# Patient Record
Sex: Female | Born: 1960 | Race: Black or African American | Hispanic: No | Marital: Married | Smoking: Never smoker
Health system: Southern US, Community
[De-identification: ages and names within clinical notes are randomized; demographics above are authoritative.]

## PROBLEM LIST (undated history)

## (undated) DIAGNOSIS — R7303 Prediabetes: Secondary | ICD-10-CM

## (undated) DIAGNOSIS — I10 Essential (primary) hypertension: Secondary | ICD-10-CM

## (undated) HISTORY — PX: ABDOMINAL HYSTERECTOMY: SHX81

## (undated) HISTORY — PX: SHOULDER SURGERY: SHX246

---

## 1999-12-20 ENCOUNTER — Emergency Department (HOSPITAL_COMMUNITY): Admission: EM | Admit: 1999-12-20 | Discharge: 1999-12-20 | Payer: Self-pay | Admitting: Emergency Medicine

## 2000-01-25 ENCOUNTER — Encounter: Payer: Self-pay | Admitting: Emergency Medicine

## 2000-01-25 ENCOUNTER — Emergency Department (HOSPITAL_COMMUNITY): Admission: EM | Admit: 2000-01-25 | Discharge: 2000-01-25 | Payer: Self-pay | Admitting: Emergency Medicine

## 2000-03-28 ENCOUNTER — Encounter: Admission: RE | Admit: 2000-03-28 | Discharge: 2000-04-29 | Payer: Self-pay | Admitting: Family Medicine

## 2000-12-12 ENCOUNTER — Emergency Department (HOSPITAL_COMMUNITY): Admission: EM | Admit: 2000-12-12 | Discharge: 2000-12-12 | Payer: Self-pay | Admitting: Nurse Practitioner

## 2000-12-12 ENCOUNTER — Encounter: Payer: Self-pay | Admitting: Emergency Medicine

## 2002-10-16 ENCOUNTER — Emergency Department (HOSPITAL_COMMUNITY): Admission: EM | Admit: 2002-10-16 | Discharge: 2002-10-16 | Payer: Self-pay | Admitting: Emergency Medicine

## 2003-04-02 ENCOUNTER — Ambulatory Visit (HOSPITAL_COMMUNITY): Admission: RE | Admit: 2003-04-02 | Discharge: 2003-04-02 | Payer: Self-pay | Admitting: Emergency Medicine

## 2003-04-02 ENCOUNTER — Emergency Department (HOSPITAL_COMMUNITY): Admission: EM | Admit: 2003-04-02 | Discharge: 2003-04-02 | Payer: Self-pay | Admitting: Emergency Medicine

## 2003-04-15 ENCOUNTER — Other Ambulatory Visit: Admission: RE | Admit: 2003-04-15 | Discharge: 2003-04-15 | Payer: Self-pay | Admitting: Family Medicine

## 2004-05-22 ENCOUNTER — Emergency Department (HOSPITAL_COMMUNITY): Admission: EM | Admit: 2004-05-22 | Discharge: 2004-05-22 | Payer: Self-pay | Admitting: Emergency Medicine

## 2004-06-13 ENCOUNTER — Encounter: Admission: RE | Admit: 2004-06-13 | Discharge: 2004-06-13 | Payer: Self-pay | Admitting: Family Medicine

## 2004-06-20 ENCOUNTER — Other Ambulatory Visit: Admission: RE | Admit: 2004-06-20 | Discharge: 2004-06-20 | Payer: Self-pay | Admitting: Family Medicine

## 2004-08-15 ENCOUNTER — Encounter: Admission: RE | Admit: 2004-08-15 | Discharge: 2004-08-15 | Payer: Self-pay | Admitting: Family Medicine

## 2004-09-06 ENCOUNTER — Encounter: Admission: RE | Admit: 2004-09-06 | Discharge: 2004-09-06 | Payer: Self-pay | Admitting: Family Medicine

## 2004-12-14 ENCOUNTER — Encounter (INDEPENDENT_AMBULATORY_CARE_PROVIDER_SITE_OTHER): Payer: Self-pay | Admitting: Specialist

## 2004-12-14 ENCOUNTER — Observation Stay (HOSPITAL_COMMUNITY): Admission: RE | Admit: 2004-12-14 | Discharge: 2004-12-15 | Payer: Self-pay | Admitting: *Deleted

## 2009-05-15 ENCOUNTER — Encounter: Admission: RE | Admit: 2009-05-15 | Discharge: 2009-05-15 | Payer: Self-pay | Admitting: Internal Medicine

## 2009-12-29 ENCOUNTER — Encounter: Admission: RE | Admit: 2009-12-29 | Discharge: 2009-12-29 | Payer: Self-pay | Admitting: Orthopedic Surgery

## 2010-06-15 NOTE — Op Note (Signed)
Marisa [name], [name]          ACCOUNT NO.:  [id number]   MEDICAL RECORD NO.:  [id number]          PATIENT TYPE:  OBV   LOCATION:  9399                          FACILITY:  [facility]   PHYSICIAN:  Gerri Spore B. Earlene Plater, M.D.  DATE OF BIRTH:  1960-06-18   DATE OF PROCEDURE:  12/14/2004  DATE OF DISCHARGE:                                 OPERATIVE REPORT   PREOPERATIVE DIAGNOSES:  1.  Abnormal uterine bleeding.  2.  Fibroid uterus.   POSTOPERATIVE DIAGNOSES:  1.  Abnormal uterine bleeding.  2.  Fibroid uterus.   OPERATION/PROCEDURE:  Laparoscopic supracervical hysterectomy.   SURGEON:  Chester Holstein. Earlene Plater, M.D.   ASSISTANT:  Cordelia Pen A. Rosalio Macadamia, M.D.   ANESTHESIA:  General.   SPECIMENS:  Uterus and tubal fibroid.   ESTIMATED BLOOD LOSS:  700 mL.   COMPLICATIONS:  None.   INDICATIONS:  The patient with a history of heavy menstrual bleeding.  Ultrasound showed uterine fibroids.  Endometrial biopsy was benign.  No  history of abnormal Pap smears.  The patient desires minimally invasive  definitive surgical therapy with laparoscopic supracervical hysterectomy.  She is aware of the 1% chance of regular menses and 7% staining or spotting  rate after this type of surgery as well as the need continued cervical  cytology.  The patient was advised of the risks of surgery including  infection, bleeding, damage to surrounding organs and potential need to  convert to abdominal hysterectomy.   DESCRIPTION OF PROCEDURE:  The patient was taken to the operating room and  general anesthesia obtained.  She was prepped and draped in the standard  fashion and Foley catheter inserted into the bladder.   A 10 mm incision placed in the umbilicus, carried sharply to the fascia.  The fascia was divided sharply and elevated with Kocher clamps.  Posterior  sheath and peritoneum were elevated with an Allis clamp and divided sharply.  Pursestring suture of 0 Vicryl placed around the fascial defect.  Hasson  cannula inserted and secured.  Pneumoperitoneum obtained with CO2 gas.  Trendelenburg position obtained and 11 mm port placed in the left upper  quadrant.  Right lower quadrant had a 5 mm port placed, each under direct  laparoscopic visualization.   The pelvis was inspected and the above findings noted.   The course of each ureter was identified and found to be well away from the  area of dissection.  The left round ligament identified, sealed and divided  with the Harmonic scalpel.  Tube and utero-ovarian ligament were similarly  sealed and divided.  Repeated on the right side in similar manner.  Bladder  flap created with Harmonic sharply.  Left uterine artery was skeletonized,  sealed and divided with the Harmonic.  Repeated on the right in the same  manner.  Cervix was truncated with a reverse cone fashion using the  Harmonic.  Hemostasis was obtained.  Harmonic was placed in the canal of the  cervix, placed on max setting for 20 seconds.   The morcellating device was then inserted in the left lower quadrant port  and morcellation performed in the standard fashion.  All debris was removed.  Pelvis was irrigated, reinspected and the intra-abdominal pressure taken  down below 9 mm.  One bleeder on the posterior aspect of the cervical stump  was noted and made hemostatic with the bipolar as was the bleeder on the  right utero-ovarian pedicle.   The lower ports were removed and their sites inspected laparoscopically.  They were hemostatic.  The fascia was not closed in the left lower quadrant  as this was an 11 mm Excel trocar.   The scope was removed, gas released and Hasson cannula removed.  The fascia  was closed with a pursestring suture.  No abdominal contents were entered  into prior to closure.  Skin closed in the umbilicus with subcuticular 4-0  Vicryl.  Skin was closed in the lower ports with Dermabond.   The patient tolerated the procedure well.  No complications.  She was  taken  to the recovery room awake, alert and in stable condition.      Gerri Spore B. Earlene Plater, M.D.  Electronically Signed     WBD/MEDQ  D:  12/14/2004  T:  12/14/2004  Job:  [id number]

## 2012-07-31 ENCOUNTER — Emergency Department (HOSPITAL_COMMUNITY): Payer: Federal, State, Local not specified - PPO

## 2012-07-31 ENCOUNTER — Emergency Department (HOSPITAL_COMMUNITY)
Admission: EM | Admit: 2012-07-31 | Discharge: 2012-08-01 | Disposition: A | Discharge status: Home / Self Care | Payer: Federal, State, Local not specified - PPO | Attending: Emergency Medicine | Admitting: Emergency Medicine

## 2012-07-31 ENCOUNTER — Encounter (HOSPITAL_COMMUNITY): Payer: Self-pay | Admitting: *Deleted

## 2012-07-31 DIAGNOSIS — R51 Headache: Secondary | ICD-10-CM | POA: Insufficient documentation

## 2012-07-31 DIAGNOSIS — I10 Essential (primary) hypertension: Secondary | ICD-10-CM

## 2012-07-31 DIAGNOSIS — R1110 Vomiting, unspecified: Secondary | ICD-10-CM | POA: Insufficient documentation

## 2012-07-31 HISTORY — DX: Prediabetes: R73.03

## 2012-07-31 HISTORY — DX: Essential (primary) hypertension: I10

## 2012-07-31 LAB — POCT I-STAT, CHEM 8
BUN: 9 mg/dL (ref 6–23)
Chloride: 102 mEq/L (ref 96–112)
Glucose, Bld: 138 mg/dL — ABNORMAL HIGH (ref 70–99)
Hemoglobin: 15 g/dL (ref 12.0–15.0)
Potassium: 3.3 mEq/L — ABNORMAL LOW (ref 3.5–5.1)
Sodium: 141 mEq/L (ref 135–145)
TCO2: 29 mmol/L (ref 0–100)

## 2012-07-31 MED ORDER — AMLODIPINE BESYLATE 5 MG PO TABS
5.0000 mg | ORAL_TABLET | Freq: Once | ORAL | Status: AC
  Administered 2012-08-01: 5 mg via ORAL
  Filled 2012-07-31: qty 1

## 2012-07-31 MED ORDER — ONDANSETRON 8 MG PO TBDP
8.0000 mg | ORAL_TABLET | Freq: Once | ORAL | Status: AC
  Administered 2012-08-01: 8 mg via ORAL
  Filled 2012-07-31: qty 1

## 2012-07-31 NOTE — ED Notes (Signed)
Pt states she has not been taking blood pressure medication in months, pt has headache and pressure 202/116  ,  Pt also states borderline diabetic

## 2012-07-31 NOTE — ED Notes (Signed)
Patient transported to CT 

## 2012-07-31 NOTE — ED Provider Notes (Signed)
History    CSN: [id number] Arrival date & time 07/31/12  2310  First MD Initiated Contact with Patient 07/31/12 2326     Chief Complaint  Patient presents with  . Hypertension   (Consider location/radiation/quality/duration/timing/severity/associated sxs/prior Treatment) Patient is a 52 y.o. female presenting with hypertension. The history is provided by the patient.  Hypertension This is a chronic problem. The current episode started more than 1 week ago. The problem occurs constantly. The problem has not changed since onset.Associated symptoms include headaches. Pertinent negatives include no chest pain, no abdominal pain and no shortness of breath. Nothing aggravates the symptoms. Nothing relieves the symptoms. She has tried nothing for the symptoms. The treatment provided no relief.  Stopped taking her meds months ago.  Today had a lot of sodium in [facility] and thinks it was bad and started vomiting and saw the grease and then had headache that has lasted 1 hour so far and is improving.  No weakness nor numbness. No changes in vision or speech Past Medical History  Diagnosis Date  . Hypertension   . Borderline diabetes    No past surgical history on file. History reviewed. No pertinent family history. History  Substance Use Topics  . Smoking status: Never Smoker   . Smokeless tobacco: Not on file  . Alcohol Use: No   OB History   Grav Para Term Preterm Abortions TAB SAB Ect Mult Living                 Review of Systems  Constitutional: Negative for fever.  HENT: Negative for neck pain and neck stiffness.   Eyes: Negative for photophobia and visual disturbance.  Respiratory: Negative for chest tightness and shortness of breath.   Cardiovascular: Negative for chest pain.  Gastrointestinal: Negative for abdominal pain.  Neurological: Positive for headaches. Negative for dizziness, facial asymmetry, speech difficulty, weakness and numbness.  All other systems reviewed and are  negative.    Allergies  Review of patient's allergies indicates no known allergies.  Home Medications   Current Outpatient Rx  Name  Route  Sig  Dispense  Refill  . aspirin-acetaminophen-caffeine (EXCEDRIN MIGRAINE) 250-250-65 MG per tablet   Oral   Take 2 tablets by mouth every 6 (six) hours as needed for pain.          BP 194/94  Pulse 85  Temp(Src) 98 F (36.7 C) (Oral)  Resp 15  Ht 4\' 11"  (1.499 m)  Wt 225 lb (102.059 kg)  BMI 45.42 kg/m2  SpO2 99% Physical Exam  Constitutional: She is oriented to person, place, and time. She appears well-developed and well-nourished. No distress.  HENT:  Head: Normocephalic and atraumatic.  Mouth/Throat: Oropharynx is clear and moist.  Eyes: Conjunctivae are normal. Pupils are equal, round, and reactive to light.  Neck: Normal range of motion. Neck supple.  Cardiovascular: Normal rate, regular rhythm and intact distal pulses.   Pulmonary/Chest: Effort normal and breath sounds normal. She has no wheezes. She has no rales.  Abdominal: Soft. Bowel sounds are normal. There is no tenderness. There is no rebound and no guarding.  Musculoskeletal: Normal range of motion.  Neurological: She is alert and oriented to person, place, and time.  Skin: Skin is warm and dry.  Psychiatric: She has a normal mood and affect.    ED Course  Procedures (including critical care time) Labs Reviewed  URINALYSIS, ROUTINE W REFLEX MICROSCOPIC   No results found. No diagnosis found.  MDM  Headache is not  10/10.  In the setting of HA within 6 hours and negative head CT no indication for LP.  Will D/c with BP meds follow up with Dr. Allyne Gee on Monday am.  Patient verbalizes understanding and agrees to follow up  Paddy Walthall Smitty Cords, MD 08/01/12 [phone]

## 2012-07-31 NOTE — ED Notes (Signed)
Do not insert IV per Dr Nicanor Alcon

## 2012-08-01 LAB — URINALYSIS, ROUTINE W REFLEX MICROSCOPIC
Hgb urine dipstick: NEGATIVE
Ketones, ur: NEGATIVE mg/dL
Specific Gravity, Urine: 1.013 (ref 1.005–1.030)
pH: 7.5 (ref 5.0–8.0)

## 2012-08-01 MED ORDER — AMLODIPINE BESYLATE 5 MG PO TABS: 5.0000 mg | ORAL_TABLET | Freq: Every day | ORAL | Status: DC

## 2012-08-01 MED ORDER — ACETAMINOPHEN 500 MG PO TABS
1000.0000 mg | ORAL_TABLET | Freq: Once | ORAL | Status: AC
  Administered 2012-08-01: 1000 mg via ORAL
  Filled 2012-08-01: qty 2

## 2012-08-01 MED ORDER — POTASSIUM CHLORIDE CRYS ER 20 MEQ PO TBCR
20.0000 meq | EXTENDED_RELEASE_TABLET | Freq: Once | ORAL | Status: AC
  Administered 2012-08-01: 20 meq via ORAL
  Filled 2012-08-01: qty 1

## 2012-08-01 NOTE — ED Notes (Signed)
Patient transported to CT 

## 2012-12-18 ENCOUNTER — Encounter: Payer: Self-pay | Admitting: Pulmonary Disease

## 2012-12-21 ENCOUNTER — Institutional Professional Consult (permissible substitution): Payer: Federal, State, Local not specified - PPO | Admitting: Pulmonary Disease

## 2014-08-03 ENCOUNTER — Encounter (HOSPITAL_BASED_OUTPATIENT_CLINIC_OR_DEPARTMENT_OTHER): Payer: Self-pay | Admitting: *Deleted

## 2014-08-03 ENCOUNTER — Emergency Department (HOSPITAL_BASED_OUTPATIENT_CLINIC_OR_DEPARTMENT_OTHER)
Admission: EM | Admit: 2014-08-03 | Discharge: 2014-08-04 | Disposition: A | Discharge status: Home / Self Care | Payer: Worker's Compensation | Attending: Emergency Medicine | Admitting: Emergency Medicine

## 2014-08-03 DIAGNOSIS — M778 Other enthesopathies, not elsewhere classified: Secondary | ICD-10-CM | POA: Insufficient documentation

## 2014-08-03 DIAGNOSIS — I10 Essential (primary) hypertension: Secondary | ICD-10-CM | POA: Insufficient documentation

## 2014-08-03 DIAGNOSIS — M79642 Pain in left hand: Secondary | ICD-10-CM | POA: Diagnosis present

## 2014-08-03 DIAGNOSIS — Z79899 Other long term (current) drug therapy: Secondary | ICD-10-CM | POA: Insufficient documentation

## 2014-08-03 NOTE — ED Notes (Signed)
Pt c/o left wrist and hand pain x 12 hrs

## 2014-08-04 ENCOUNTER — Emergency Department (HOSPITAL_BASED_OUTPATIENT_CLINIC_OR_DEPARTMENT_OTHER): Payer: Worker's Compensation

## 2014-08-04 MED ORDER — IBUPROFEN 800 MG PO TABS: 800.0000 mg | ORAL_TABLET | Freq: Three times a day (TID) | ORAL | Status: DC | PRN

## 2014-08-04 MED ORDER — HYDROCODONE-ACETAMINOPHEN 5-325 MG PO TABS: 1.0000 | ORAL_TABLET | ORAL | Status: DC | PRN

## 2014-08-04 NOTE — Discharge Instructions (Signed)
Tendinitis °Tendinitis is swelling and inflammation of the tendons. Tendons are band-like tissues that connect muscle to bone. Tendinitis commonly occurs in the:  °· Shoulders (rotator cuff). °· Heels (Achilles tendon). °· Elbows (triceps tendon). °CAUSES °Tendinitis is usually caused by overusing the tendon, muscles, and joints involved. When the tissue surrounding a tendon (synovium) becomes inflamed, it is called tenosynovitis. Tendinitis commonly develops in people whose jobs require repetitive motions. °SYMPTOMS °· Pain. °· Tenderness. °· Mild swelling. °DIAGNOSIS °Tendinitis is usually diagnosed by physical exam. Your health care provider may also order X-rays or other imaging tests. °TREATMENT °Your health care provider may recommend certain medicines or exercises for your treatment. °HOME CARE INSTRUCTIONS  °· Use a sling or splint for as long as directed by your health care provider until the pain decreases. °· Put ice on the injured area. °¨ Put ice in a plastic bag. °¨ Place a towel between your skin and the bag. °¨ Leave the ice on for 15-20 minutes, 3-4 times a day, or as directed by your health care provider. °· Avoid using the limb while the tendon is painful. Perform gentle range of motion exercises only as directed by your health care provider. Stop exercises if pain or discomfort increase, unless directed otherwise by your health care provider. °· Only take over-the-counter or prescription medicines for pain, discomfort, or fever as directed by your health care provider. °SEEK MEDICAL CARE IF:  °· Your pain and swelling increase. °· You develop new, unexplained symptoms, especially increased numbness in the hands. °MAKE SURE YOU:  °· Understand these instructions. °· Will watch your condition. °· Will get help right away if you are not doing well or get worse. °Document Released: 01/12/2000 Document Revised: 05/31/2013 Document Reviewed: 04/02/2010 °Exit[employer]® Patient Information ©2015 [employer]  [employer]. This information is not intended to replace advice given to you by your health care provider. Make sure you discuss any questions you have with your health care provider. ° ° °RICE: Routine Care for Injuries °The routine care of many injuries includes Rest, Ice, Compression, and Elevation (RICE). °HOME CARE INSTRUCTIONS °· Rest is needed to allow your body to heal. Routine activities can usually be resumed when comfortable. Injured tendons and bones can take up to 6 weeks to heal. Tendons are the cord-like structures that attach muscle to bone. °· Ice following an injury helps keep the swelling down and reduces pain. °¨ Put ice in a plastic bag. °¨ Place a towel between your skin and the bag. °¨ Leave the ice on for 15-20 minutes, 3-4 times a day, or as directed by your health care provider. Do this while awake, for the first 24 to 48 hours. After that, continue as directed by your caregiver. °· Compression helps keep swelling down. It also gives support and helps with discomfort. If an elastic bandage has been applied, it should be removed and reapplied every 3 to 4 hours. It should not be applied tightly, but firmly enough to keep swelling down. Watch fingers or toes for swelling, bluish discoloration, coldness, numbness, or excessive pain. If any of these problems occur, remove the bandage and reapply loosely. Contact your caregiver if these problems continue. °· Elevation helps reduce swelling and decreases pain. With extremities, such as the arms, hands, legs, and feet, the injured area should be placed near or above the level of the heart, if possible. °SEEK IMMEDIATE MEDICAL CARE IF: °· You have persistent pain and swelling. °· You develop redness, numbness, or unexpected weakness. °·   Your symptoms are getting worse rather than improving after several days. °These symptoms may indicate that further evaluation or further X-rays are needed. Sometimes, X-rays may not show a small broken bone (fracture)  until 1 week or 10 days later. Make a follow-up appointment with your caregiver. Ask when your X-ray results will be ready. Make sure you get your X-ray results. °Document Released: 04/28/2000 Document Revised: 01/19/2013 Document Reviewed: 06/15/2010 °ExitCare® Patient Information ©2015 [employer]. This information is not intended to replace advice given to you by your health care provider. Make sure you discuss any questions you have with your health care provider. ° °

## 2014-08-04 NOTE — ED Provider Notes (Signed)
TIME SEEN: 12:20 AM  CHIEF COMPLAINT: Left wrist and hand pain  HPI: Pt is a 54 y.o. right-hand-dominant female with history of hypertension who presents to the emergency department with complaints of left hand and wrist pain for the past 12 hours. States she does repetitive work at her job and she thinks this is what triggered her pain. No other injury. No numbness currently or focal weakness. No fever. Took ibuprofen earlier today with some relief. States she drove herself to the hospital and plans to drive her self home and also plans to go back to work.    ROS: See HPI Constitutional: no fever  Eyes: no drainage  ENT: no runny nose   Cardiovascular:  no chest pain  Resp: no SOB  GI: no vomiting GU: no dysuria Integumentary: no rash  Allergy: no hives  Musculoskeletal: no leg swelling  Neurological: no slurred speech ROS otherwise negative  PAST MEDICAL HISTORY/PAST SURGICAL HISTORY:  Past Medical History  Diagnosis Date  . Hypertension   . Borderline diabetes     MEDICATIONS:  Prior to Admission medications   Medication Sig Start Date End Date Taking? Authorizing Provider  Olmesartan-Amlodipine-HCTZ (TRIBENZOR) 40-10-25 MG TABS Take by mouth.   Yes Historical Provider, MD  aspirin-acetaminophen-caffeine (EXCEDRIN MIGRAINE) [phone] MG per tablet Take 2 tablets by mouth every 6 (six) hours as needed for pain.    Historical Provider, MD  Liraglutide (VICTOZA) 18 MG/3ML SOPN Inject 18 mg into the skin every other day.    Historical Provider, MD    ALLERGIES:  No Known Allergies  SOCIAL HISTORY:  History  Substance Use Topics  . Smoking status: Never Smoker   . Smokeless tobacco: Not on file  . Alcohol Use: No    FAMILY HISTORY: Family History  Problem Relation Age of Onset  . Diabetes Mother   . Hypertension Mother     EXAM: BP 137/80 mmHg  Pulse 72  Temp(Src) 98 F (36.7 C) (Oral)  Resp 18  Ht 5' (1.524 m)  Wt 200 lb (90.719 kg)  BMI 39.06 kg/m2   SpO2 99% CONSTITUTIONAL: Alert and oriented and responds appropriately to questions. Well-appearing; well-nourished HEAD: Normocephalic EYES: Conjunctivae clear, PERRL ENT: normal nose; no rhinorrhea; moist mucous membranes; pharynx without lesions noted NECK: Supple, no meningismus, no LAD  CARD: RRR; S1 and S2 appreciated; no murmurs, no clicks, no rubs, no gallops RESP: Normal chest excursion without splinting or tachypnea; breath sounds clear and equal bilaterally; no wheezes, no rhonchi, no rales, no hypoxia or respiratory distress, speaking full sentences ABD/GI: Normal bowel sounds; non-distended; soft, non-tender, no rebound, no guarding, no peritoneal signs BACK:  The back appears normal and is non-tender to palpation, there is no CVA tenderness EXT: Tender diffusely over the left wrist and left thumb without obvious deformity or swelling or ecchymosis, patient has pain with both flexion and extension of the wrist but has full range of motion, no tenderness over the left elbow or left shoulder, 2+ radial pulses bilaterally, Normal ROM in all joints; otherwise extremities are non-tender to palpation; no edema; normal capillary refill; no cyanosis, no calf tenderness or swelling; no erythema or warmth, sensation to light touch intact diffusely    SKIN: Normal color for age and race; warm NEURO: Moves all extremities equally, sensation to light touch intact diffusely, cranial nerves II through XII intact PSYCH: The patient's mood and manner are appropriate. Grooming and personal hygiene are appropriate.  MEDICAL DECISION MAKING: Patient here with left wrist and  hand pain. No obvious deformity. No sign of septic arthritis. X-ray show no fracture or dislocation. Suspect this is tendinitis from repetitive use. Have advised her to keep her hand elevated, rest, apply ice. We'll discharge with prescription for ibuprofen and Vicodin. Purple-red work note giving her light duty for the next week. Have  also provided her with Dr. Lazaro Arms follow-up information. Discussed return precautions and supportive care instructions. She verbalized understanding and is comfortable with this plan.      Layla Maw Argusta Mcgann, DO 08/04/14 [phone]

## 2014-08-04 NOTE — ED Notes (Signed)
MD at bedside. 

## 2015-02-16 ENCOUNTER — Encounter (HOSPITAL_COMMUNITY): Payer: Self-pay | Admitting: Emergency Medicine

## 2015-02-16 ENCOUNTER — Emergency Department (HOSPITAL_COMMUNITY)
Admission: EM | Admit: 2015-02-16 | Discharge: 2015-02-17 | Disposition: A | Discharge status: Home / Self Care | Payer: Federal, State, Local not specified - PPO | Attending: Emergency Medicine | Admitting: Emergency Medicine

## 2015-02-16 DIAGNOSIS — I10 Essential (primary) hypertension: Secondary | ICD-10-CM | POA: Diagnosis not present

## 2015-02-16 DIAGNOSIS — H9201 Otalgia, right ear: Secondary | ICD-10-CM | POA: Diagnosis not present

## 2015-02-16 DIAGNOSIS — R509 Fever, unspecified: Secondary | ICD-10-CM | POA: Diagnosis present

## 2015-02-16 DIAGNOSIS — Z79899 Other long term (current) drug therapy: Secondary | ICD-10-CM | POA: Diagnosis not present

## 2015-02-16 DIAGNOSIS — B349 Viral infection, unspecified: Secondary | ICD-10-CM | POA: Insufficient documentation

## 2015-02-16 DIAGNOSIS — R000 Tachycardia, unspecified: Secondary | ICD-10-CM | POA: Insufficient documentation

## 2015-02-16 MED ORDER — ACETAMINOPHEN 325 MG PO TABS
650.0000 mg | ORAL_TABLET | Freq: Once | ORAL | Status: AC | PRN
  Administered 2015-02-16: 650 mg via ORAL
  Filled 2015-02-16: qty 2

## 2015-02-16 NOTE — ED Notes (Addendum)
Pt states she started having URI symptoms yesterday and today she has had HTN. States she just took her BP meds. Febrile. Headache. Alert and oriented.

## 2015-02-16 NOTE — ED Provider Notes (Signed)
CSN: [id number]     Arrival date & time 02/16/15  2303 History  By signing my name below, I, Marisa [name], attest that this documentation has been prepared under the direction and in the presence of [name], PA-C. Electronically Signed: [name], ED Scribe. 02/16/2015. 12:10 AM.   Chief Complaint  Patient presents with  . URI  . Hypertension   The history is provided by the patient. No language interpreter was used.   HPI Comments: Marisa [name] is a 55 y.o. female, with a PMhx of HTN, who presents to the Emergency Department with a chief complaint of a progressively worsening, constant URI, onset 2 days ago that is significant for a productive cough, chest congestion, a sinus pressure headache, generalized myalgias, chills, a mild sore throat and right-sided otalgia that radiates to musculature of right side of neck. The pt is febrile at 101.58F on triage vitals. She also notes hypertension onset today despite taking hypertension medication as prescribed. She denies dysuria, abdominal pain, nausea, vomiting, diarrhea, or new or unusual vaginal discharge or bleeding. Pt is tolerating secretions. NKDA.  Past Medical History  Diagnosis Date  . Hypertension   . Borderline diabetes    Past Surgical History  Procedure Laterality Date  . Cesarean section    . Shoulder surgery    . Abdominal hysterectomy     Family History  Problem Relation Age of Onset  . Diabetes Mother   . Hypertension Mother    Social History  Substance Use Topics  . Smoking status: Never Smoker   . Smokeless tobacco: None  . Alcohol Use: No   OB History    No data available     Review of Systems  Constitutional: Positive for fever and chills.  HENT: Positive for congestion ( chest), ear pain, sinus pressure and sore throat. Negative for trouble swallowing.   Respiratory: Positive for cough.   Cardiovascular: Positive for chest pain.  Gastrointestinal: Negative for nausea, vomiting,  abdominal pain and diarrhea.  Genitourinary: Negative for dysuria, vaginal bleeding and vaginal discharge.  Musculoskeletal: Positive for myalgias ( generalized).  All other systems reviewed and are negative.  Allergies  Review of patient's allergies indicates no known allergies.  Home Medications   Prior to Admission medications   Medication Sig Start Date End Date Taking? Authorizing Provider  aspirin-acetaminophen-caffeine (EXCEDRIN MIGRAINE) [phone] MG per tablet Take 2 tablets by mouth every 6 (six) hours as needed for pain.   Yes Historical Provider, MD  Chlorpheniramine-Acetaminophen (CORICIDIN HBP COLD/FLU PO) Take 1 tablet by mouth 2 (two) times daily.   Yes Historical Provider, MD  guaiFENesin (MUCINEX) 600 MG 12 hr tablet Take 600 mg by mouth 2 (two) times daily.   Yes Historical Provider, MD  ibuprofen (ADVIL,MOTRIN) 200 MG tablet Take 400 mg by mouth every 6 (six) hours as needed for moderate pain.   Yes Historical Provider, MD  Olmesartan-Amlodipine-HCTZ (TRIBENZOR) 40-10-25 MG TABS Take 1 tablet by mouth daily.    Yes Historical Provider, MD  HYDROcodone-acetaminophen (NORCO/VICODIN) 5-325 MG per tablet Take 1 tablet by mouth every 4 (four) hours as needed. Patient not taking: Reported on 02/17/2015 08/04/14   [name], DO  ibuprofen (ADVIL,MOTRIN) 800 MG tablet Take 1 tablet (800 mg total) by mouth every 8 (eight) hours as needed for mild pain. Patient not taking: Reported on 02/17/2015 08/04/14   [name], DO   BP 187/99 mmHg  Pulse 104  Temp(Src) 101.2 F (38.4 C) (Oral)  Resp 18  SpO2 95% Physical Exam  Constitutional: She is oriented to person, place, and time. She appears well-developed and well-nourished.  HENT:  Head: Normocephalic and atraumatic.  Bilateral TMs clear. Oropharynx mildly erythematous, no exudate, no abscess.  Eyes: EOM are normal. Pupils are equal, round, and reactive to light.  Neck: Neck supple.  Normal ROM, no neck stiffness or  meningismus.   Cardiovascular: Regular rhythm and normal heart sounds.   Mild tachycardia.   Pulmonary/Chest: Effort normal and breath sounds normal. No respiratory distress. She has no wheezes. She has no rales. She exhibits no tenderness.  CTAB.   Abdominal: Soft. Bowel sounds are normal. She exhibits no distension and no mass. There is no tenderness. There is no rebound and no guarding.  Musculoskeletal: Normal range of motion. She exhibits no edema.  Neurological: She is alert and oriented to person, place, and time. No cranial nerve deficit.  Skin: Skin is warm and dry.  Psychiatric: She has a normal mood and affect.  Nursing note and vitals reviewed.   ED Course  Procedures  DIAGNOSTIC STUDIES: Oxygen Saturation is 95% on RA, adequateby my interpretation.    COORDINATION OF CARE: 11:58 PM Discussed treatment plan which includes labs and CXR  with pt. Pt acknowledges and agrees to plan.   Results for orders placed or performed during the hospital encounter of 02/16/15  Rapid strep screen  Result Value Ref Range   Streptococcus, Group A Screen (Direct) NEGATIVE NEGATIVE  CBC with Differential/Platelet  Result Value Ref Range   WBC 8.1 4.0 - 10.5 K/uL   RBC 5.93 (H) 3.87 - 5.11 MIL/uL   Hemoglobin 13.4 12.0 - 15.0 g/dL   HCT 16.1 09.6 - 04.5 %   MCV 71.2 (L) 78.0 - 100.0 fL   MCH 22.6 (L) 26.0 - 34.0 pg   MCHC 31.8 30.0 - 36.0 g/dL   RDW 40.9 (H) 81.1 - 91.4 %   Platelets 245 150 - 400 K/uL   Neutrophils Relative % 82 %   Neutro Abs 6.6 1.7 - 7.7 K/uL   Lymphocytes Relative 8 %   Lymphs Abs 0.7 0.7 - 4.0 K/uL   Monocytes Relative 10 %   Monocytes Absolute 0.8 0.1 - 1.0 K/uL   Eosinophils Relative 0 %   Eosinophils Absolute 0.0 0.0 - 0.7 K/uL   Basophils Relative 0 %   Basophils Absolute 0.0 0.0 - 0.1 K/uL  Basic metabolic panel  Result Value Ref Range   Sodium 140 135 - 145 mmol/L   Potassium 3.5 3.5 - 5.1 mmol/L   Chloride 103 101 - 111 mmol/L   CO2 27 22 - 32  mmol/L   Glucose, Bld 140 (H) 65 - 99 mg/dL   BUN 9 6 - 20 mg/dL   Creatinine, Ser 7.82 0.44 - 1.00 mg/dL   Calcium 9.0 8.9 - 95.6 mg/dL   GFR calc non Af Amer >60 >60 mL/min   GFR calc Af Amer >60 >60 mL/min   Anion gap 10 5 - 15  I-Stat CG4 Lactic Acid, ED  Result Value Ref Range   Lactic Acid, Venous 1.15 0.5 - 2.0 mmol/L   Dg Chest 2 View  02/17/2015  CLINICAL DATA:  55 year old female with cough and fever EXAM: CHEST  2 VIEW COMPARISON:  Chest radiograph dated 05/15/2009 FINDINGS: Two views of the chest demonstrate mild interstitial prominence. There is no focal consolidation, pleural effusion, or pneumothorax. The cardiac silhouette is within normal limits. The osseous structures appear unremarkable. IMPRESSION: No focal  consolidation. Electronically Signed   By: Elgie Collard M.D.   On: 02/17/2015 00:41       MDM   Final diagnoses:  Viral illness    Patient with URI and fever.  Also noted to be hypertensive.  Complains of headache, but does not have any neck stiff or appear toxic.  Highly doubt meningitis.  Patient states that her headache is frontal/sinus.  This is consistent with the rest of her symptoms.  She has a productive cough, and generalized body aches.  Could be influenza or flu-like illness.  Will check labs and give fluids.  Will check CXR.  CXR is clear.  Labs are reassuring.  No leukocytosis.  No elevated lactate.  Strep negative.  No urinary symptoms.  Suspect flu or flu-like illness.  Discussed with Dr. Clarene Duke, who agrees with the plan.  I personally performed the services described in this documentation, which was scribed in my presence. The recorded information has been reviewed and is accurate.      [name], PA-C 02/17/15 0349  Samuel Jester, DO 02/19/15 [id number]

## 2015-02-17 ENCOUNTER — Emergency Department (HOSPITAL_COMMUNITY): Payer: Federal, State, Local not specified - PPO

## 2015-02-17 LAB — CBC WITH DIFFERENTIAL/PLATELET
BASOS ABS: 0 10*3/uL (ref 0.0–0.1)
BASOS PCT: 0 %
EOS ABS: 0 10*3/uL (ref 0.0–0.7)
EOS PCT: 0 %
HCT: 42.2 % (ref 36.0–46.0)
HEMOGLOBIN: 13.4 g/dL (ref 12.0–15.0)
LYMPHS ABS: 0.7 10*3/uL (ref 0.7–4.0)
Lymphocytes Relative: 8 %
MCH: 22.6 pg — ABNORMAL LOW (ref 26.0–34.0)
MCHC: 31.8 g/dL (ref 30.0–36.0)
MCV: 71.2 fL — ABNORMAL LOW (ref 78.0–100.0)
Monocytes Absolute: 0.8 10*3/uL (ref 0.1–1.0)
Monocytes Relative: 10 %
NEUTROS PCT: 82 %
Neutro Abs: 6.6 10*3/uL (ref 1.7–7.7)
PLATELETS: 245 10*3/uL (ref 150–400)
RBC: 5.93 MIL/uL — AB (ref 3.87–5.11)
RDW: 16.8 % — ABNORMAL HIGH (ref 11.5–15.5)
WBC: 8.1 10*3/uL (ref 4.0–10.5)

## 2015-02-17 LAB — BASIC METABOLIC PANEL
ANION GAP: 10 (ref 5–15)
BUN: 9 mg/dL (ref 6–20)
CHLORIDE: 103 mmol/L (ref 101–111)
CO2: 27 mmol/L (ref 22–32)
Calcium: 9 mg/dL (ref 8.9–10.3)
Creatinine, Ser: 0.84 mg/dL (ref 0.44–1.00)
Glucose, Bld: 140 mg/dL — ABNORMAL HIGH (ref 65–99)
POTASSIUM: 3.5 mmol/L (ref 3.5–5.1)
SODIUM: 140 mmol/L (ref 135–145)

## 2015-02-17 LAB — RAPID STREP SCREEN (MED CTR MEBANE ONLY): STREPTOCOCCUS, GROUP A SCREEN (DIRECT): NEGATIVE

## 2015-02-17 LAB — I-STAT CG4 LACTIC ACID, ED: LACTIC ACID, VENOUS: 1.15 mmol/L (ref 0.5–2.0)

## 2015-02-17 MED ORDER — IBUPROFEN 800 MG PO TABS
800.0000 mg | ORAL_TABLET | Freq: Once | ORAL | Status: AC
  Administered 2015-02-17: 800 mg via ORAL
  Filled 2015-02-17: qty 1

## 2015-02-17 MED ORDER — HYDROCODONE-HOMATROPINE 5-1.5 MG/5ML PO SYRP
5.0000 mL | ORAL_SOLUTION | Freq: Once | ORAL | Status: AC
  Administered 2015-02-17: 5 mL via ORAL
  Filled 2015-02-17: qty 5

## 2015-02-17 MED ORDER — HYDROCODONE-HOMATROPINE 5-1.5 MG/5ML PO SYRP: 5.0000 mL | ORAL_SOLUTION | Freq: Four times a day (QID) | ORAL | Status: DC | PRN

## 2015-02-17 MED ORDER — SODIUM CHLORIDE 0.9 % IV BOLUS (SEPSIS)
1000.0000 mL | Freq: Once | INTRAVENOUS | Status: AC
  Administered 2015-02-17: 1000 mL via INTRAVENOUS

## 2015-02-17 NOTE — ED Notes (Signed)
PA at bedside.

## 2015-02-17 NOTE — Discharge Instructions (Signed)
Upper Respiratory Infection, Adult Most upper respiratory infections (URIs) are a viral infection of the air passages leading to the lungs. A URI affects the nose, throat, and upper air passages. The most common type of URI is nasopharyngitis and is typically referred to as "the common cold." URIs run their course and usually go away on their own. Most of the time, a URI does not require medical attention, but sometimes a bacterial infection in the upper airways can follow a viral infection. This is called a secondary infection. Sinus and middle ear infections are common types of secondary upper respiratory infections. Bacterial pneumonia can also complicate a URI. A URI can worsen asthma and chronic obstructive pulmonary disease (COPD). Sometimes, these complications can require emergency medical care and may be life threatening.  CAUSES Almost all URIs are caused by viruses. A virus is a type of germ and can spread from one person to another.  RISKS FACTORS You may be at risk for a URI if:   You smoke.   You have chronic heart or lung disease.  You have a weakened defense (immune) system.   You are very young or very old.   You have nasal allergies or asthma.  You work in crowded or poorly ventilated areas.  You work in health care facilities or schools. SIGNS AND SYMPTOMS  Symptoms typically develop 2-3 days after you come in contact with a cold virus. Most viral URIs last 7-10 days. However, viral URIs from the influenza virus (flu virus) can last 14-18 days and are typically more severe. Symptoms may include:   Runny or stuffy (congested) nose.   Sneezing.   Cough.   Sore throat.   Headache.   Fatigue.   Fever.   Loss of appetite.   Pain in your forehead, behind your eyes, and over your cheekbones (sinus pain).  Muscle aches.  DIAGNOSIS  Your health care provider may diagnose a URI by:  Physical exam.  Tests to check that your symptoms are not due to  another condition such as:  Strep throat.  Sinusitis.  Pneumonia.  Asthma. TREATMENT  A URI goes away on its own with time. It cannot be cured with medicines, but medicines may be prescribed or recommended to relieve symptoms. Medicines may help:  Reduce your fever.  Reduce your cough.  Relieve nasal congestion. HOME CARE INSTRUCTIONS   Take medicines only as directed by your health care provider.   Gargle warm saltwater or take cough drops to comfort your throat as directed by your health care provider.  Use a warm mist humidifier or inhale steam from a shower to increase air moisture. This may make it easier to breathe.  Drink enough fluid to keep your urine clear or pale yellow.   Eat soups and other clear broths and maintain good nutrition.   Rest as needed.   Return to work when your temperature has returned to normal or as your health care provider advises. You may need to stay home longer to avoid infecting others. You can also use a face mask and careful hand washing to prevent spread of the virus.  Increase the usage of your inhaler if you have asthma.   Do not use any tobacco products, including cigarettes, chewing tobacco, or electronic cigarettes. If you need help quitting, ask your health care provider. PREVENTION  The best way to protect yourself from getting a cold is to practice good hygiene.   Avoid oral or hand contact with people with cold   symptoms.   Wash your hands often if contact occurs.  There is no clear evidence that vitamin C, vitamin E, echinacea, or exercise reduces the chance of developing a cold. However, it is always recommended to get plenty of rest, exercise, and practice good nutrition.  SEEK MEDICAL CARE IF:   You are getting worse rather than better.   Your symptoms are not controlled by medicine.   You have chills.  You have worsening shortness of breath.  You have brown or red mucus.  You have yellow or brown nasal  discharge.  You have pain in your face, especially when you bend forward.  You have a fever.  You have swollen neck glands.  You have pain while swallowing.  You have white areas in the back of your throat. SEEK IMMEDIATE MEDICAL CARE IF:   You have severe or persistent:  Headache.  Ear pain.  Sinus pain.  Chest pain.  You have chronic lung disease and any of the following:  Wheezing.  Prolonged cough.  Coughing up blood.  A change in your usual mucus.  You have a stiff neck.  You have changes in your:  Vision.  Hearing.  Thinking.  Mood. MAKE SURE YOU:   Understand these instructions.  Will watch your condition.  Will get help right away if you are not doing well or get worse.   This information is not intended to replace advice given to you by your health care provider. Make sure you discuss any questions you have with your health care provider.   Document Released: 07/10/2000 Document Revised: 05/31/2014 Document Reviewed: 04/21/2013 Elsevier Interactive Patient Education 2016 [employer]  

## 2015-02-17 NOTE — ED Notes (Signed)
Patient transported to X-ray 

## 2015-02-19 LAB — CULTURE, GROUP A STREP (THRC)

## 2015-06-28 DIAGNOSIS — Z6841 Body Mass Index (BMI) 40.0 and over, adult: Secondary | ICD-10-CM | POA: Diagnosis not present

## 2015-06-28 DIAGNOSIS — I10 Essential (primary) hypertension: Secondary | ICD-10-CM | POA: Diagnosis not present

## 2015-06-28 DIAGNOSIS — R635 Abnormal weight gain: Secondary | ICD-10-CM | POA: Diagnosis not present

## 2015-06-28 DIAGNOSIS — R7309 Other abnormal glucose: Secondary | ICD-10-CM | POA: Diagnosis not present

## 2015-08-08 DIAGNOSIS — R922 Inconclusive mammogram: Secondary | ICD-10-CM | POA: Diagnosis not present

## 2015-08-08 DIAGNOSIS — N6459 Other signs and symptoms in breast: Secondary | ICD-10-CM | POA: Diagnosis not present

## 2015-08-08 DIAGNOSIS — N6002 Solitary cyst of left breast: Secondary | ICD-10-CM | POA: Diagnosis not present

## 2015-11-30 DIAGNOSIS — E1165 Type 2 diabetes mellitus with hyperglycemia: Secondary | ICD-10-CM | POA: Diagnosis not present

## 2015-11-30 DIAGNOSIS — I10 Essential (primary) hypertension: Secondary | ICD-10-CM | POA: Diagnosis not present

## 2015-11-30 DIAGNOSIS — Z0000 Encounter for general adult medical examination without abnormal findings: Secondary | ICD-10-CM | POA: Diagnosis not present

## 2015-12-08 DIAGNOSIS — I10 Essential (primary) hypertension: Secondary | ICD-10-CM | POA: Diagnosis not present

## 2015-12-08 DIAGNOSIS — Z6841 Body Mass Index (BMI) 40.0 and over, adult: Secondary | ICD-10-CM | POA: Diagnosis not present

## 2015-12-13 DIAGNOSIS — Z6841 Body Mass Index (BMI) 40.0 and over, adult: Secondary | ICD-10-CM | POA: Diagnosis not present

## 2015-12-13 DIAGNOSIS — Z713 Dietary counseling and surveillance: Secondary | ICD-10-CM | POA: Diagnosis not present

## 2016-01-01 DIAGNOSIS — J209 Acute bronchitis, unspecified: Secondary | ICD-10-CM | POA: Diagnosis not present

## 2016-01-01 DIAGNOSIS — Z1211 Encounter for screening for malignant neoplasm of colon: Secondary | ICD-10-CM | POA: Diagnosis not present

## 2016-01-01 DIAGNOSIS — K5900 Constipation, unspecified: Secondary | ICD-10-CM | POA: Diagnosis not present

## 2016-01-01 DIAGNOSIS — E876 Hypokalemia: Secondary | ICD-10-CM | POA: Diagnosis not present

## 2016-01-09 DIAGNOSIS — M79671 Pain in right foot: Secondary | ICD-10-CM | POA: Diagnosis not present

## 2016-01-09 DIAGNOSIS — M722 Plantar fascial fibromatosis: Secondary | ICD-10-CM | POA: Diagnosis not present

## 2016-01-09 DIAGNOSIS — M79672 Pain in left foot: Secondary | ICD-10-CM | POA: Diagnosis not present

## 2016-01-09 DIAGNOSIS — M25571 Pain in right ankle and joints of right foot: Secondary | ICD-10-CM | POA: Diagnosis not present

## 2016-01-09 DIAGNOSIS — M25572 Pain in left ankle and joints of left foot: Secondary | ICD-10-CM | POA: Diagnosis not present

## 2016-01-12 DIAGNOSIS — H25012 Cortical age-related cataract, left eye: Secondary | ICD-10-CM | POA: Diagnosis not present

## 2016-01-12 DIAGNOSIS — H2513 Age-related nuclear cataract, bilateral: Secondary | ICD-10-CM | POA: Diagnosis not present

## 2016-01-12 DIAGNOSIS — H52223 Regular astigmatism, bilateral: Secondary | ICD-10-CM | POA: Diagnosis not present

## 2016-01-12 DIAGNOSIS — H524 Presbyopia: Secondary | ICD-10-CM | POA: Diagnosis not present

## 2016-01-16 DIAGNOSIS — Z1211 Encounter for screening for malignant neoplasm of colon: Secondary | ICD-10-CM | POA: Diagnosis not present

## 2016-01-16 DIAGNOSIS — K635 Polyp of colon: Secondary | ICD-10-CM | POA: Diagnosis not present

## 2016-01-16 DIAGNOSIS — D124 Benign neoplasm of descending colon: Secondary | ICD-10-CM | POA: Diagnosis not present

## 2016-01-16 DIAGNOSIS — K529 Noninfective gastroenteritis and colitis, unspecified: Secondary | ICD-10-CM | POA: Diagnosis not present

## 2016-01-16 DIAGNOSIS — A09 Infectious gastroenteritis and colitis, unspecified: Secondary | ICD-10-CM | POA: Diagnosis not present

## 2016-01-16 LAB — HM COLONOSCOPY

## 2016-01-25 DIAGNOSIS — M7660 Achilles tendinitis, unspecified leg: Secondary | ICD-10-CM | POA: Diagnosis not present

## 2016-01-25 DIAGNOSIS — M722 Plantar fascial fibromatosis: Secondary | ICD-10-CM | POA: Diagnosis not present

## 2016-01-25 DIAGNOSIS — M79672 Pain in left foot: Secondary | ICD-10-CM | POA: Diagnosis not present

## 2016-01-25 DIAGNOSIS — M79671 Pain in right foot: Secondary | ICD-10-CM | POA: Diagnosis not present

## 2016-02-08 DIAGNOSIS — Z6841 Body Mass Index (BMI) 40.0 and over, adult: Secondary | ICD-10-CM | POA: Diagnosis not present

## 2016-02-08 DIAGNOSIS — M79671 Pain in right foot: Secondary | ICD-10-CM | POA: Insufficient documentation

## 2016-02-08 DIAGNOSIS — E66813 Obesity, class 3: Secondary | ICD-10-CM | POA: Insufficient documentation

## 2016-02-08 DIAGNOSIS — M79672 Pain in left foot: Secondary | ICD-10-CM | POA: Insufficient documentation

## 2016-02-26 DIAGNOSIS — M722 Plantar fascial fibromatosis: Secondary | ICD-10-CM | POA: Diagnosis not present

## 2016-03-14 DIAGNOSIS — Z6841 Body Mass Index (BMI) 40.0 and over, adult: Secondary | ICD-10-CM | POA: Diagnosis not present

## 2016-03-14 DIAGNOSIS — Z713 Dietary counseling and surveillance: Secondary | ICD-10-CM | POA: Diagnosis not present

## 2016-04-04 DIAGNOSIS — E1165 Type 2 diabetes mellitus with hyperglycemia: Secondary | ICD-10-CM | POA: Diagnosis not present

## 2016-04-04 DIAGNOSIS — I10 Essential (primary) hypertension: Secondary | ICD-10-CM | POA: Diagnosis not present

## 2016-04-04 DIAGNOSIS — Z6841 Body Mass Index (BMI) 40.0 and over, adult: Secondary | ICD-10-CM | POA: Diagnosis not present

## 2016-04-30 DIAGNOSIS — N6322 Unspecified lump in the left breast, upper inner quadrant: Secondary | ICD-10-CM | POA: Diagnosis not present

## 2016-04-30 DIAGNOSIS — N6002 Solitary cyst of left breast: Secondary | ICD-10-CM | POA: Diagnosis not present

## 2016-05-01 DIAGNOSIS — Z6841 Body Mass Index (BMI) 40.0 and over, adult: Secondary | ICD-10-CM | POA: Diagnosis not present

## 2016-05-01 DIAGNOSIS — M79671 Pain in right foot: Secondary | ICD-10-CM | POA: Diagnosis not present

## 2016-05-01 DIAGNOSIS — M79672 Pain in left foot: Secondary | ICD-10-CM | POA: Diagnosis not present

## 2016-05-15 DIAGNOSIS — I10 Essential (primary) hypertension: Secondary | ICD-10-CM | POA: Diagnosis not present

## 2016-05-15 DIAGNOSIS — Z6841 Body Mass Index (BMI) 40.0 and over, adult: Secondary | ICD-10-CM | POA: Diagnosis not present

## 2016-05-15 DIAGNOSIS — R609 Edema, unspecified: Secondary | ICD-10-CM | POA: Diagnosis not present

## 2016-05-16 IMAGING — CR DG CHEST 2V
2 series · 2 of 2 positions shown · non-contrast
Comparison: Chest radiograph dated 05/15/2009

CLINICAL DATA: 54-year-old female with cough and fever

EXAM:
CHEST  2 VIEW

[w chest pa]
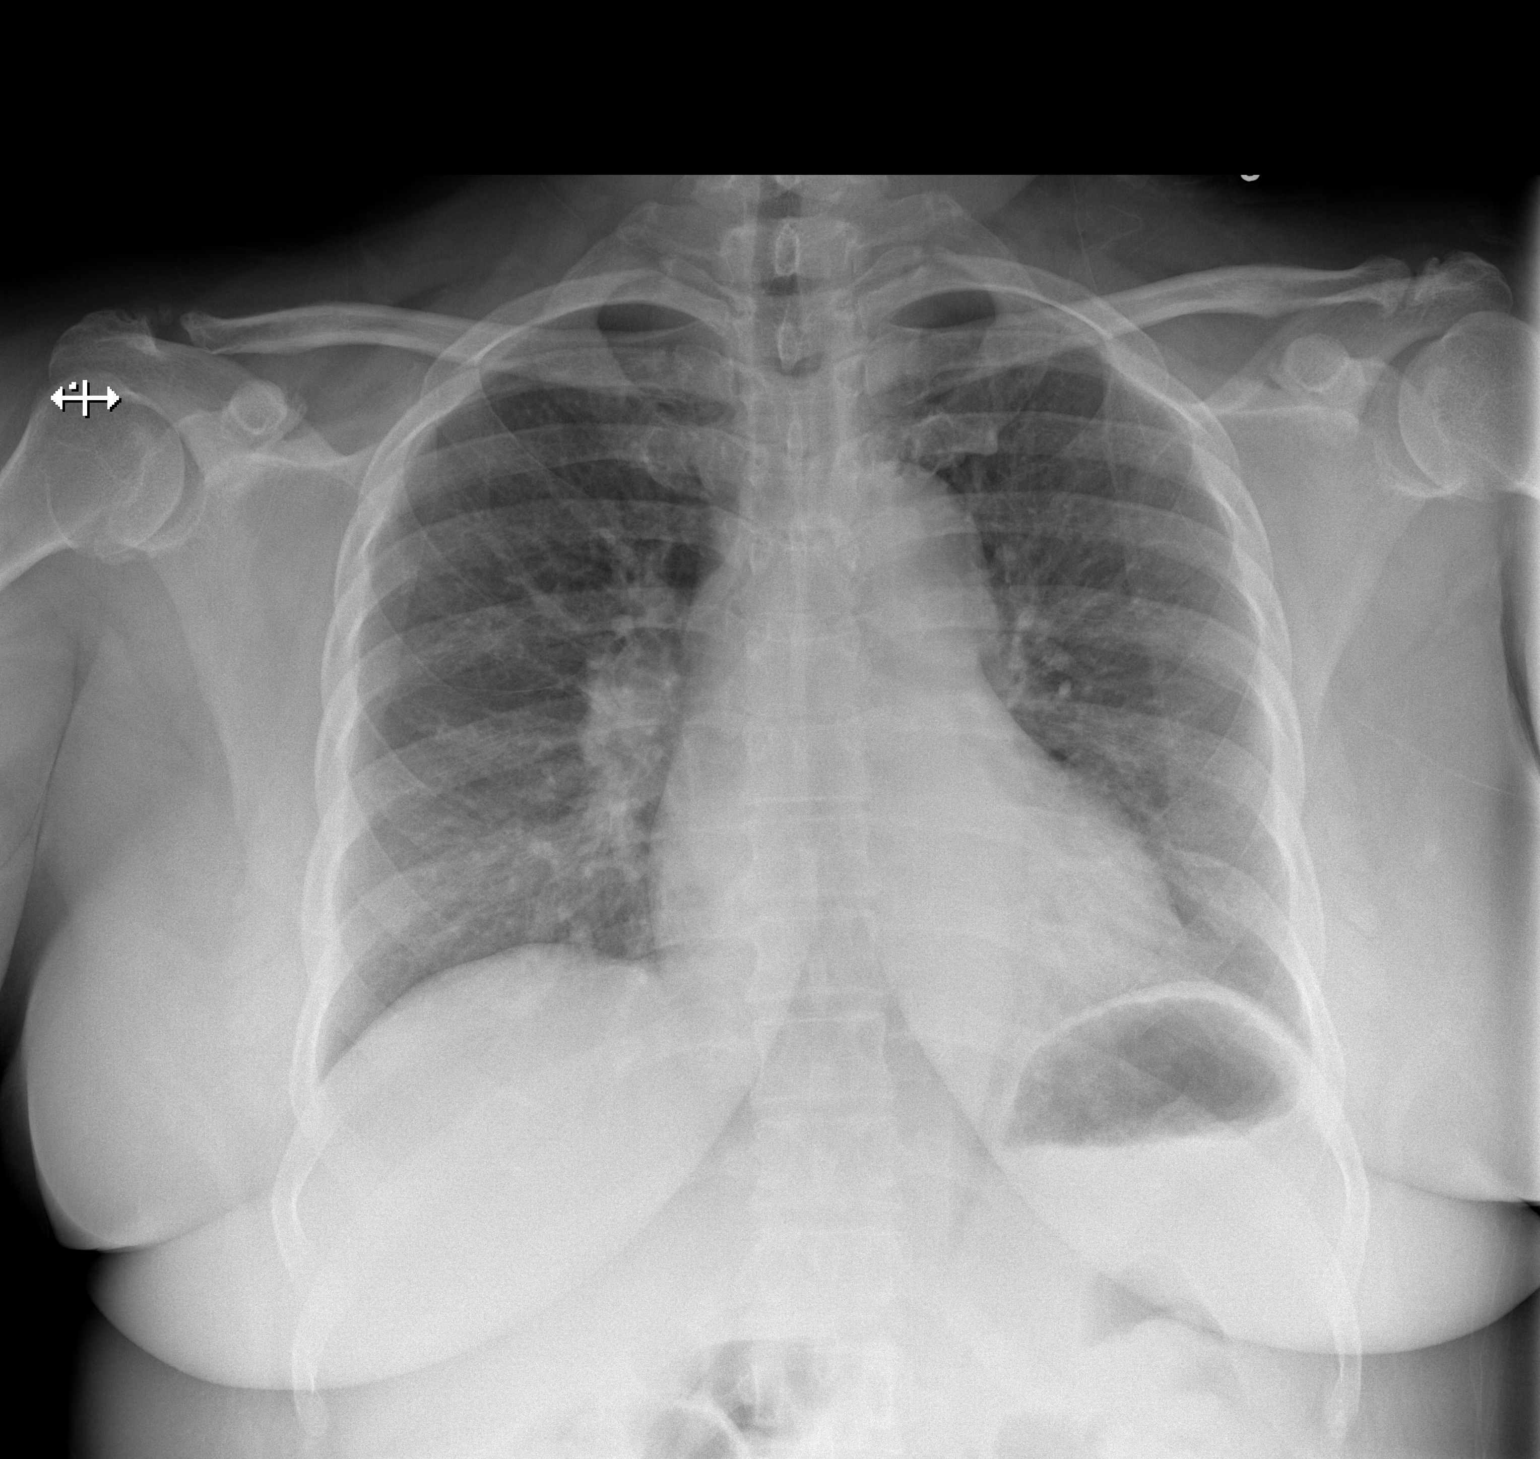

[w chest lat]
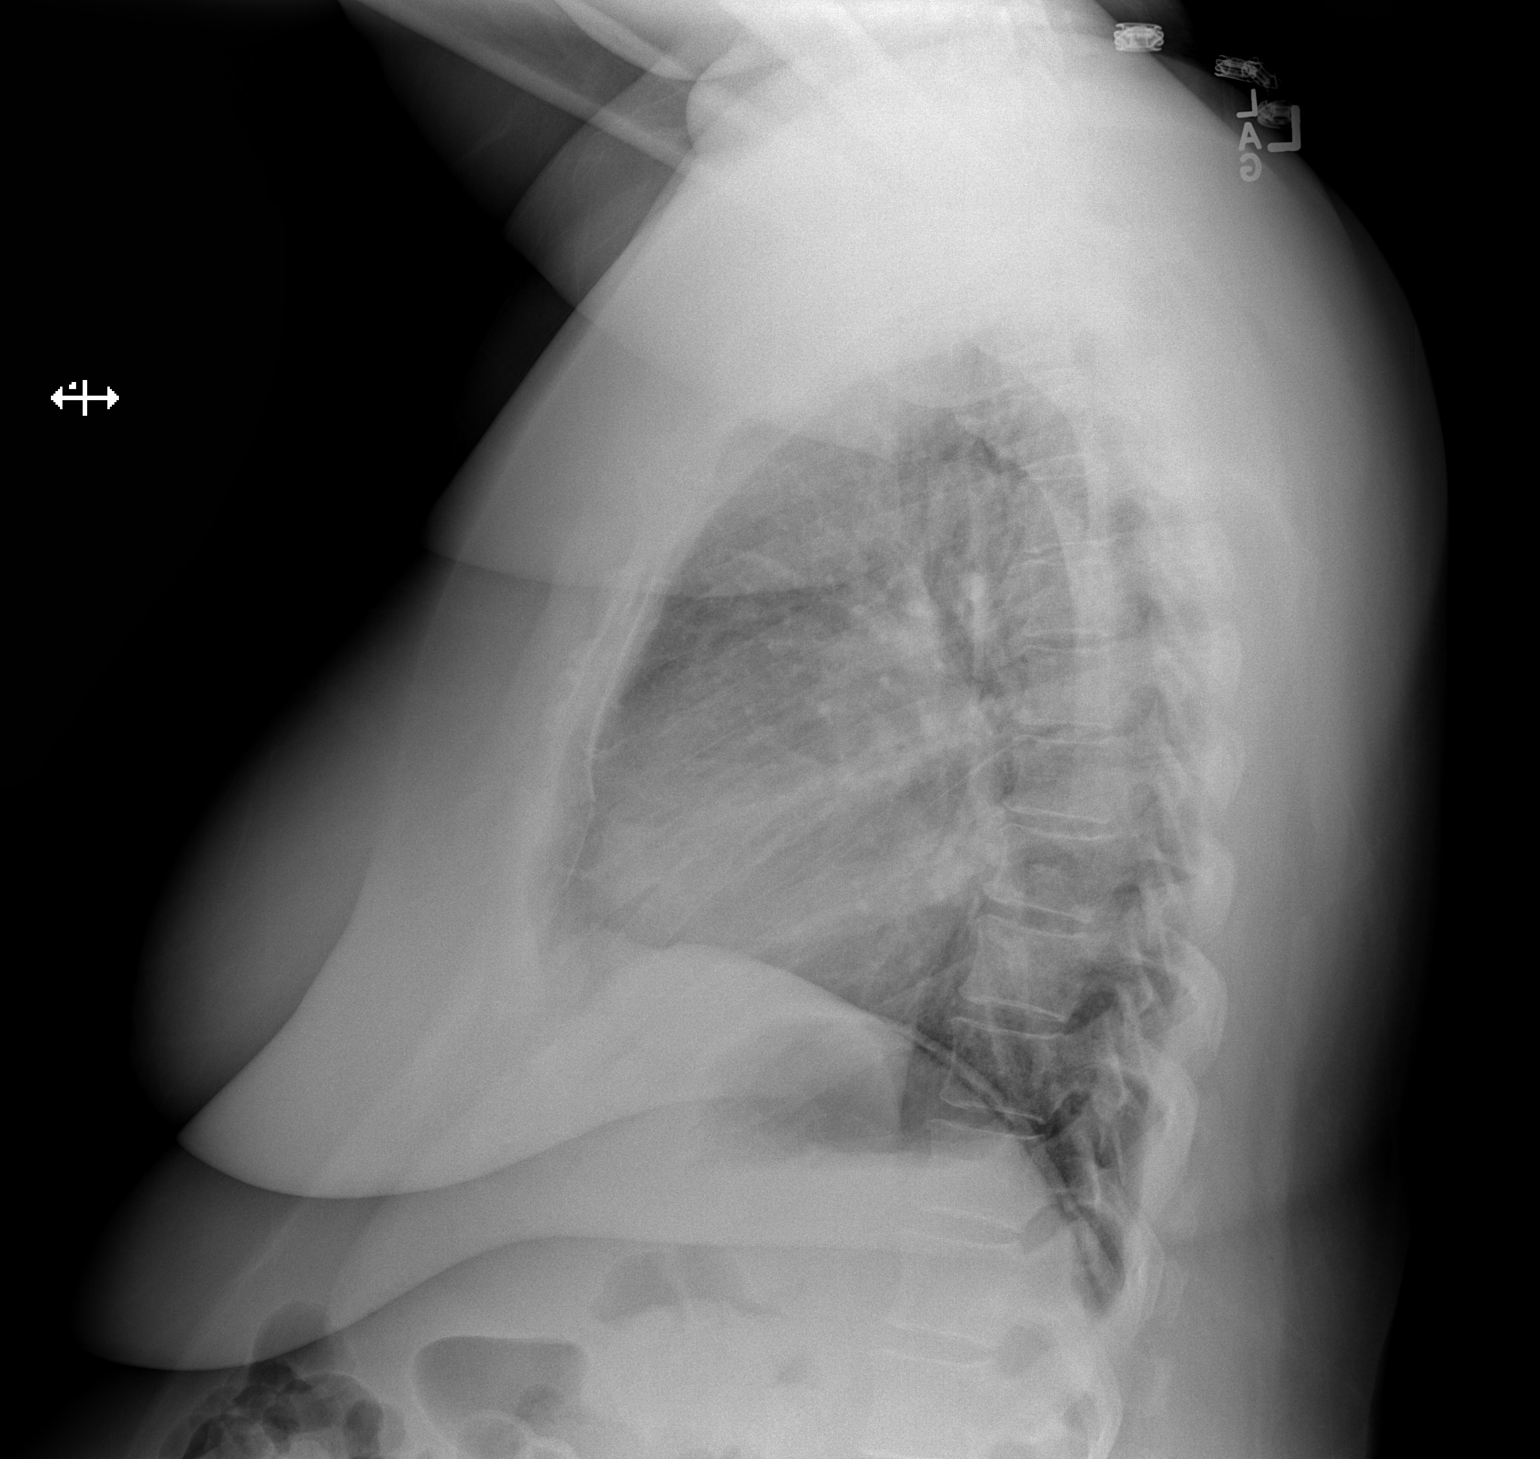

[2 of 2 positions shown; findings below may reference images not displayed]

FINDINGS: Two views of the chest demonstrate mild interstitial prominence.
There is no focal consolidation, pleural effusion, or pneumothorax.
The cardiac silhouette is within normal limits. The osseous
structures appear unremarkable.
IMPRESSION: No focal consolidation.

## 2016-05-21 DIAGNOSIS — I10 Essential (primary) hypertension: Secondary | ICD-10-CM | POA: Diagnosis not present

## 2016-05-21 DIAGNOSIS — Z6841 Body Mass Index (BMI) 40.0 and over, adult: Secondary | ICD-10-CM | POA: Diagnosis not present

## 2016-06-04 DIAGNOSIS — Z713 Dietary counseling and surveillance: Secondary | ICD-10-CM | POA: Diagnosis not present

## 2016-06-04 DIAGNOSIS — Z6841 Body Mass Index (BMI) 40.0 and over, adult: Secondary | ICD-10-CM | POA: Diagnosis not present

## 2016-07-10 DIAGNOSIS — I10 Essential (primary) hypertension: Secondary | ICD-10-CM | POA: Diagnosis not present

## 2016-07-10 DIAGNOSIS — R7309 Other abnormal glucose: Secondary | ICD-10-CM | POA: Diagnosis not present

## 2016-07-10 DIAGNOSIS — Z6841 Body Mass Index (BMI) 40.0 and over, adult: Secondary | ICD-10-CM | POA: Diagnosis not present

## 2016-07-10 DIAGNOSIS — E669 Obesity, unspecified: Secondary | ICD-10-CM | POA: Diagnosis not present

## 2016-07-16 DIAGNOSIS — Z6841 Body Mass Index (BMI) 40.0 and over, adult: Secondary | ICD-10-CM | POA: Diagnosis not present

## 2016-07-16 DIAGNOSIS — I10 Essential (primary) hypertension: Secondary | ICD-10-CM | POA: Diagnosis not present

## 2016-08-29 DIAGNOSIS — I10 Essential (primary) hypertension: Secondary | ICD-10-CM | POA: Diagnosis not present

## 2016-08-29 DIAGNOSIS — Z6841 Body Mass Index (BMI) 40.0 and over, adult: Secondary | ICD-10-CM | POA: Diagnosis not present

## 2016-09-09 DIAGNOSIS — M25572 Pain in left ankle and joints of left foot: Secondary | ICD-10-CM | POA: Diagnosis not present

## 2016-09-09 DIAGNOSIS — Z6841 Body Mass Index (BMI) 40.0 and over, adult: Secondary | ICD-10-CM | POA: Diagnosis not present

## 2016-09-09 DIAGNOSIS — I10 Essential (primary) hypertension: Secondary | ICD-10-CM | POA: Diagnosis not present

## 2016-09-09 DIAGNOSIS — E1165 Type 2 diabetes mellitus with hyperglycemia: Secondary | ICD-10-CM | POA: Diagnosis not present

## 2016-10-07 DIAGNOSIS — E1165 Type 2 diabetes mellitus with hyperglycemia: Secondary | ICD-10-CM | POA: Diagnosis not present

## 2016-10-23 DIAGNOSIS — Z6841 Body Mass Index (BMI) 40.0 and over, adult: Secondary | ICD-10-CM | POA: Diagnosis not present

## 2016-10-23 DIAGNOSIS — I10 Essential (primary) hypertension: Secondary | ICD-10-CM | POA: Diagnosis not present

## 2016-10-23 DIAGNOSIS — E559 Vitamin D deficiency, unspecified: Secondary | ICD-10-CM | POA: Diagnosis not present

## 2016-11-20 DIAGNOSIS — N6002 Solitary cyst of left breast: Secondary | ICD-10-CM | POA: Diagnosis not present

## 2016-11-20 DIAGNOSIS — N6009 Solitary cyst of unspecified breast: Secondary | ICD-10-CM | POA: Diagnosis not present

## 2017-01-08 DIAGNOSIS — Z0000 Encounter for general adult medical examination without abnormal findings: Secondary | ICD-10-CM | POA: Diagnosis not present

## 2017-01-08 DIAGNOSIS — Z1212 Encounter for screening for malignant neoplasm of rectum: Secondary | ICD-10-CM | POA: Diagnosis not present

## 2017-01-08 DIAGNOSIS — E1165 Type 2 diabetes mellitus with hyperglycemia: Secondary | ICD-10-CM | POA: Diagnosis not present

## 2017-01-08 DIAGNOSIS — Z01419 Encounter for gynecological examination (general) (routine) without abnormal findings: Secondary | ICD-10-CM | POA: Diagnosis not present

## 2017-01-08 DIAGNOSIS — I10 Essential (primary) hypertension: Secondary | ICD-10-CM | POA: Diagnosis not present

## 2017-01-15 DIAGNOSIS — Z6841 Body Mass Index (BMI) 40.0 and over, adult: Secondary | ICD-10-CM | POA: Diagnosis not present

## 2017-01-15 DIAGNOSIS — I10 Essential (primary) hypertension: Secondary | ICD-10-CM | POA: Diagnosis not present

## 2017-05-20 DIAGNOSIS — I10 Essential (primary) hypertension: Secondary | ICD-10-CM | POA: Diagnosis not present

## 2017-05-20 DIAGNOSIS — Z6841 Body Mass Index (BMI) 40.0 and over, adult: Secondary | ICD-10-CM | POA: Diagnosis not present

## 2017-05-20 DIAGNOSIS — Z1389 Encounter for screening for other disorder: Secondary | ICD-10-CM | POA: Diagnosis not present

## 2017-05-20 DIAGNOSIS — E1165 Type 2 diabetes mellitus with hyperglycemia: Secondary | ICD-10-CM | POA: Diagnosis not present

## 2017-05-29 DIAGNOSIS — Z7189 Other specified counseling: Secondary | ICD-10-CM | POA: Diagnosis not present

## 2017-05-29 DIAGNOSIS — E1165 Type 2 diabetes mellitus with hyperglycemia: Secondary | ICD-10-CM | POA: Diagnosis not present

## 2017-07-10 DIAGNOSIS — Z6841 Body Mass Index (BMI) 40.0 and over, adult: Secondary | ICD-10-CM | POA: Diagnosis not present

## 2017-07-10 DIAGNOSIS — E1165 Type 2 diabetes mellitus with hyperglycemia: Secondary | ICD-10-CM | POA: Diagnosis not present

## 2017-08-25 DIAGNOSIS — L02214 Cutaneous abscess of groin: Secondary | ICD-10-CM | POA: Diagnosis not present

## 2017-09-23 DIAGNOSIS — Z6841 Body Mass Index (BMI) 40.0 and over, adult: Secondary | ICD-10-CM | POA: Diagnosis not present

## 2017-09-23 DIAGNOSIS — E1165 Type 2 diabetes mellitus with hyperglycemia: Secondary | ICD-10-CM | POA: Diagnosis not present

## 2017-09-23 DIAGNOSIS — I10 Essential (primary) hypertension: Secondary | ICD-10-CM | POA: Diagnosis not present

## 2017-11-25 ENCOUNTER — Encounter: Payer: Self-pay | Admitting: Internal Medicine

## 2017-11-25 ENCOUNTER — Ambulatory Visit (INDEPENDENT_AMBULATORY_CARE_PROVIDER_SITE_OTHER): Payer: Federal, State, Local not specified - PPO | Admitting: Internal Medicine

## 2017-11-25 VITALS — BP 118/70 | HR 80 | Temp 98.2°F | Ht 60.0 in | Wt 232.8 lb

## 2017-11-25 DIAGNOSIS — R062 Wheezing: Secondary | ICD-10-CM

## 2017-11-25 DIAGNOSIS — J40 Bronchitis, not specified as acute or chronic: Secondary | ICD-10-CM

## 2017-11-25 MED ORDER — IPRATROPIUM-ALBUTEROL 0.5-2.5 (3) MG/3ML IN SOLN: 3.0000 mL | Freq: Once | RESPIRATORY_TRACT | Status: DC

## 2017-11-25 MED ORDER — IPRATROPIUM-ALBUTEROL 0.5-2.5 (3) MG/3ML IN SOLN
3.0000 mL | Freq: Once | RESPIRATORY_TRACT | Status: AC
  Administered 2017-11-25: 3 mL via RESPIRATORY_TRACT

## 2017-11-25 MED ORDER — ALBUTEROL SULFATE (2.5 MG/3ML) 0.083% IN NEBU: 2.5000 mg | INHALATION_SOLUTION | Freq: Four times a day (QID) | RESPIRATORY_TRACT | 0 refills | Status: DC | PRN

## 2017-11-25 MED ORDER — CEFTRIAXONE SODIUM 1 G IJ SOLR
1.0000 g | Freq: Once | INTRAMUSCULAR | Status: AC
  Administered 2017-11-25: 1 g via INTRAMUSCULAR

## 2017-11-25 MED ORDER — ALBUTEROL SULFATE HFA 108 (90 BASE) MCG/ACT IN AERS
2.0000 | INHALATION_SPRAY | Freq: Four times a day (QID) | RESPIRATORY_TRACT | 2 refills | Status: DC | PRN
Start: 2017-11-25 — End: 2018-05-06

## 2017-11-25 MED ORDER — GUAIFENESIN-CODEINE 100-10 MG/5ML PO SOLN: ORAL | 0 refills | Status: DC

## 2017-11-25 MED ORDER — AZITHROMYCIN 250 MG PO TABS: ORAL_TABLET | ORAL | 0 refills | Status: AC

## 2017-11-25 NOTE — Patient Instructions (Addendum)
If you get worse in 48-72h, you need to be seen again.  Start on the [employer]. Use the inhaler every 4-6 hours for 3-5 days, ones the cough attacks calm down, use it at needed.  Do not over use the cough medication, since stopping your cough interferes from allowing your lungs to clear up, and you may end up with pneumonia.     Acute Bronchitis, Adult Acute bronchitis is when air tubes (bronchi) in the lungs suddenly get swollen. The condition can make it hard to breathe. It can also cause these symptoms:  A cough.  Coughing up clear, yellow, or green mucus.  Wheezing.  Chest congestion.  Shortness of breath.  A fever.  Body aches.  Chills.  A sore throat.  Follow these instructions at home: Medicines  Take over-the-counter and prescription medicines only as told by your doctor.  If you were prescribed an antibiotic medicine, take it as told by your doctor. Do not stop taking the antibiotic even if you start to feel better. General instructions  Rest.  Drink enough fluids to keep your pee (urine) clear or pale yellow.  Avoid smoking and secondhand smoke. If you smoke and you need help quitting, ask your doctor. Quitting will help your lungs heal faster.  Use an inhaler, cool mist vaporizer, or humidifier as told by your doctor.  Keep all follow-up visits as told by your doctor. This is important. How is this prevented? To lower your risk of getting this condition again:  Wash your hands often with soap and water. If you cannot use soap and water, use hand sanitizer.  Avoid contact with people who have cold symptoms.  Try not to touch your hands to your mouth, nose, or eyes.  Make sure to get the flu shot every year.  Contact a doctor if:  Your symptoms do not get better in 2 weeks. Get help right away if:  You cough up blood.  You have chest pain.  You have very bad shortness of breath.  You become dehydrated.  You faint (pass out) or keep  feeling like you are going to pass out.  You keep throwing up (vomiting).  You have a very bad headache.  Your fever or chills gets worse. This information is not intended to replace advice given to you by your health care provider. Make sure you discuss any questions you have with your health care provider. Document Released: 07/03/2007 Document Revised: 08/23/2015 Document Reviewed: 07/05/2015 Elsevier Interactive Patient Education  [employer].

## 2017-11-25 NOTE — Progress Notes (Signed)
Subjective:     Patient ID: Marisa [name] , female    DOB: August 09, 1960 , 57 y.o.   MRN: [id number]   Chief Complaint  Patient presents with  . Cough    3 days     HPI For the past week she has been fatigued and her BP has been [place]. She has been sneezing and having watery eyes, but no itchy eys.  Onset of non productive cough, slight aches, and chills x 4 days, felt worse in the past 48h where the cough is keeping her awake. Did not check her temp. Has been having cough attacks. She noticed wheezing last night, and slept in a chair. Feels R upper chest pain when she coughs. No CP if she does not cough. Does not get a flu shot normally. She was exposed to the Flu last week.  Has been using an old Robitusin AC med which is helping, but is out.  Tends to gets this every year. Last year she had it x 3. Normally gets a shot and inhaler which helps her recover.   Past Medical History:  Diagnosis Date  . Borderline diabetes   . Hypertension       Current Outpatient Medications:  .  Olmesartan-Amlodipine-HCTZ (TRIBENZOR) 40-10-25 MG TABS, Take 1 tablet by mouth daily. , Disp: , Rfl:  .  Semaglutide,0.25 or 0.5MG /DOS, (OZEMPIC, 0.25 OR 0.5 MG/DOSE,) 2 MG/1.5ML SOPN, Inject into the skin., Disp: , Rfl:    No Known Allergies   Review of Systems  Constitutional: Positive for chills and fatigue. Negative for diaphoresis and fever.  HENT: Positive for ear pain, rhinorrhea, sneezing and sore throat. Negative for congestion, ear discharge, facial swelling, hearing loss, postnasal drip, sinus pain, trouble swallowing and voice change.        Has St and R ear pain 3 days ago  Eyes: Positive for itching.  Respiratory: Positive for cough and wheezing. Negative for chest tightness and shortness of breath.        She snores  Cardiovascular: Negative for chest pain.  Skin: Negative for rash.  Allergic/Immunologic: Positive for environmental allergies.  Neurological: Positive for headaches.     Has had mild HA     Today's Vitals   11/25/17 1602  BP: 118/70  Pulse: 80  Temp: 98.2 F (36.8 C)  TempSrc: Oral  SpO2: 95%  Weight: 232 lb 12.8 oz (105.6 kg)  Height: 5' (1.524 m)   Body mass index is 45.47 kg/m.   Objective:  Physical Exam  Constitutional: She is oriented to person, place, and time. She appears well-developed and well-nourished. No distress.  HENT:  Head: Normocephalic.  Right Ear: External ear normal.  Left Ear: External ear normal.  Nose: Nose normal.  Mouth/Throat: Oropharynx is clear and moist. No oropharyngeal exudate.  All her sinuses are slightly tender  Eyes: Conjunctivae are normal. Right eye exhibits no discharge. Left eye exhibits no discharge. No scleral icterus.  Neck: Neck supple. No tracheal deviation present. No thyromegaly present.  Cardiovascular: Normal rate and regular rhythm.  Pulmonary/Chest: Effort normal and breath sounds normal. No respiratory distress. She has no wheezes. She has no rales. She exhibits no tenderness.  No egophony and neg whisper pectoriloquie  Lymphadenopathy:    She has no cervical adenopathy.  Neurological: She is alert and oriented to person, place, and time.  Skin: Skin is warm and dry. No rash noted. She is not diaphoretic.  Psychiatric: She has a normal mood and affect. Her behavior  is normal. Judgment and thought content normal.  Nursing note and vitals reviewed.   Post neb lung exam unchanged, but pulse ox improved to 96%     Assessment And Plan:     1. Wheezing- recurent - ipratropium-albuterol (DUONEB) 0.5-2.5 (3) MG/3ML nebulizer solution 3 mL  2. Bronchitis- acute, recurrent.     She was given cefTRIAXone (ROCEPHIN) injection 1 g here today.     I gave her a refill of Rob [facility], needs to start Zpack tomorrow and I also sent Proventil inhaler. When she was leaving she asked my MA if she could have a neb machine rx sent. I did. I'm not sure if her insurance will cover it.   Dannell Raczkowski  RODRIGUEZ-SOUTHWORTH, PA-C

## 2017-11-27 ENCOUNTER — Other Ambulatory Visit: Payer: Self-pay

## 2017-11-30 ENCOUNTER — Other Ambulatory Visit: Payer: Self-pay | Admitting: Internal Medicine

## 2017-12-03 DIAGNOSIS — K080 Exfoliation of teeth due to systemic causes: Secondary | ICD-10-CM | POA: Diagnosis not present

## 2017-12-09 DIAGNOSIS — K080 Exfoliation of teeth due to systemic causes: Secondary | ICD-10-CM | POA: Diagnosis not present

## 2017-12-11 DIAGNOSIS — K080 Exfoliation of teeth due to systemic causes: Secondary | ICD-10-CM | POA: Diagnosis not present

## 2017-12-18 DIAGNOSIS — K080 Exfoliation of teeth due to systemic causes: Secondary | ICD-10-CM | POA: Diagnosis not present

## 2017-12-24 ENCOUNTER — Ambulatory Visit: Payer: Self-pay | Admitting: Internal Medicine

## 2018-02-05 ENCOUNTER — Encounter: Payer: Self-pay | Admitting: Internal Medicine

## 2018-02-05 ENCOUNTER — Ambulatory Visit (INDEPENDENT_AMBULATORY_CARE_PROVIDER_SITE_OTHER): Admitting: Internal Medicine

## 2018-02-05 VITALS — BP 116/90 | HR 76 | Temp 97.3°F | Ht 63.0 in | Wt 241.6 lb

## 2018-02-05 DIAGNOSIS — Z0000 Encounter for general adult medical examination without abnormal findings: Secondary | ICD-10-CM | POA: Diagnosis not present

## 2018-02-05 DIAGNOSIS — Z23 Encounter for immunization: Secondary | ICD-10-CM | POA: Diagnosis not present

## 2018-02-05 DIAGNOSIS — E1165 Type 2 diabetes mellitus with hyperglycemia: Secondary | ICD-10-CM | POA: Diagnosis not present

## 2018-02-05 DIAGNOSIS — I10 Essential (primary) hypertension: Secondary | ICD-10-CM | POA: Diagnosis not present

## 2018-02-05 DIAGNOSIS — E66813 Obesity, class 3: Secondary | ICD-10-CM | POA: Insufficient documentation

## 2018-02-05 DIAGNOSIS — D649 Anemia, unspecified: Secondary | ICD-10-CM

## 2018-02-05 DIAGNOSIS — Z6841 Body Mass Index (BMI) 40.0 and over, adult: Secondary | ICD-10-CM

## 2018-02-05 LAB — POCT UA - MICROALBUMIN
Albumin/Creatinine Ratio, Urine, POC: <30
Creatinine, POC: 300 mg/dL
Microalbumin Ur, POC: 30 mg/L

## 2018-02-05 LAB — POCT URINALYSIS DIPSTICK
Bilirubin, UA: NEGATIVE
Blood, UA: NEGATIVE
Glucose, UA: NEGATIVE
KETONES UA: NEGATIVE
Leukocytes, UA: NEGATIVE
Nitrite, UA: NEGATIVE
PROTEIN UA: NEGATIVE
SPEC GRAV UA: 1.02 (ref 1.010–1.025)
Urobilinogen, UA: NEGATIVE E.U./dL — AB
pH, UA: 6 (ref 5.0–8.0)

## 2018-02-05 MED ORDER — TETANUS-DIPHTH-ACELL PERTUSSIS 5-2.5-18.5 LF-MCG/0.5 IM SUSP
0.5000 mL | Freq: Once | INTRAMUSCULAR | Status: AC
  Administered 2018-02-05: 0.5 mL via INTRAMUSCULAR

## 2018-02-05 NOTE — Patient Instructions (Signed)

## 2018-02-05 NOTE — Progress Notes (Signed)
Subjective:     Patient ID: Marisa [name] , female    DOB: 04/23/1960 , 58 y.o.   MRN: [id number]   Chief Complaint  Patient presents with  . Annual Exam  . Diabetes  . Hypertension    HPI  She is here today for a full physical examination. She is no longer followed by GYN. She has no specific concerns or complaints at this time.   Diabetes  She presents for her follow-up diabetic visit. She has type 2 diabetes mellitus. Her disease course has been stable. There are no hypoglycemic associated symptoms. There are no hypoglycemic complications. Risk factors for coronary artery disease include diabetes mellitus, hypertension, sedentary lifestyle, post-menopausal, dyslipidemia and obesity. She does not see a podiatrist. Hypertension  This is a chronic problem. The current episode started more than 1 year ago. The problem has been gradually improving since onset. The problem is uncontrolled. Compliance problems include exercise.    She reports compliance with medications.   Past Medical History:  Diagnosis Date  . Borderline diabetes   . Hypertension      Family History  Problem Relation Age of Onset  . Diabetes Mother   . Hypertension Mother      Current Outpatient Medications:  .  albuterol (PROVENTIL HFA;VENTOLIN HFA) 108 (90 Base) MCG/ACT inhaler, Inhale 2 puffs into the lungs every 6 (six) hours as needed for wheezing or shortness of breath (and cough)., Disp: 1 Inhaler, Rfl: 2 .  Olmesartan-Amlodipine-HCTZ (TRIBENZOR) 40-10-25 MG TABS, Take 1 tablet by mouth daily. , Disp: , Rfl:  .  OZEMPIC, 0.25 OR 0.5 MG/DOSE, 2 MG/1.5ML SOPN, INJECT 0.5 MG UNDER THE SKIN EVERY WEEK, ON THE SAME DAY OF EACH WEEK, IN THE ABDOMEN, THIGHS, OR UPPER ARM ROTATING INJECTION SITES, Disp: 1 pen, Rfl: 1 .  albuterol (PROVENTIL) (2.5 MG/3ML) 0.083% nebulizer solution, Take 3 mLs (2.5 mg total) by nebulization every 6 (six) hours as needed for wheezing or shortness of breath (for neb machine  ordered today.). (Patient not taking: Reported on 02/05/2018), Disp: 75 mL, Rfl: 0 .  guaiFENesin-codeine 100-10 MG/5ML syrup, 1-2 tsp q hs prn cough (Patient not taking: Reported on 02/05/2018), Disp: 120 mL, Rfl: 0  Current Facility-Administered Medications:  .  Tdap (BOOSTRIX) injection 0.5 mL, 0.5 mL, Intramuscular, Once, [place] Chard, MD   No Known Allergies     No LMP recorded. Patient has had a hysterectomy..  Negative for: breast discharge, breast lump(s), breast pain and breast self exam. Associated symptoms include abnormal vaginal bleeding. Pertinent negatives include abnormal bleeding (hematology), anxiety, decreased libido, depression, difficulty falling sleep, dyspareunia, history of infertility, nocturia, sexual dysfunction, sleep disturbances, urinary incontinence, urinary urgency, vaginal discharge and vaginal itching. Diet regular.The patient states her exercise level is  minimal.  . The patient's tobacco use is:  Social History   Tobacco Use  Smoking Status Never Smoker  Smokeless Tobacco Never Used  . She has been exposed to passive smoke. The patient's alcohol use is:  Social History   Substance and Sexual Activity  Alcohol Use No  . Additional information: Last pap 01/08/2017, next one scheduled for 2021.   Review of Systems  Constitutional: Negative.   HENT: Negative.   Eyes: Negative.   Respiratory: Negative.   Cardiovascular: Negative.   Gastrointestinal: Negative.   Endocrine: Negative.   Genitourinary: Negative.   Musculoskeletal: Negative.   Skin: Negative.   Allergic/Immunologic: Negative.   Neurological: Negative.   Hematological: Negative.   Psychiatric/Behavioral: Negative.  Today's Vitals   02/05/18 1045  BP: 116/90  Pulse: 76  Temp: (!) 97.3 F (36.3 C)  TempSrc: Oral  Weight: 241 lb 9.6 oz (109.6 kg)  Height: _0  (1.6 m)   Body mass index is 42.8 kg/m.   Objective:  Physical Exam Vitals signs and nursing note reviewed.   Constitutional:      Appearance: Normal appearance. She is obese.  HENT:     Head: Normocephalic and atraumatic.     Right Ear: Tympanic membrane, ear canal and external ear normal.     Left Ear: Tympanic membrane, ear canal and external ear normal.     Nose: Nose normal.     Mouth/Throat:     Mouth: Mucous membranes are moist.     Pharynx: Oropharynx is clear.  Eyes:     Conjunctiva/sclera: Conjunctivae normal.     Pupils: Pupils are equal, round, and reactive to light.  Neck:     Musculoskeletal: Normal range of motion and neck supple.  Cardiovascular:     Rate and Rhythm: Normal rate and regular rhythm.     Pulses: Normal pulses.          Dorsalis pedis pulses are 2+ on the right side and 2+ on the left side.     Heart sounds: Normal heart sounds.  Pulmonary:     Effort: Pulmonary effort is normal.     Breath sounds: Normal breath sounds.  Chest:     Breasts:        Right: Normal. No swelling, bleeding, inverted nipple, mass or nipple discharge.        Left: Normal. No swelling, bleeding, inverted nipple, mass or nipple discharge.  Abdominal:     General: Abdomen is flat. Bowel sounds are normal.     Palpations: Abdomen is soft.  Genitourinary:    Comments: deferred Musculoskeletal: Normal range of motion.     Right foot: Normal range of motion. No deformity.     Left foot: Normal range of motion. No deformity.  Feet:     Right foot:     Protective Sensation: 5 sites tested. 5 sites sensed.     Skin integrity: Skin integrity normal.     Toenail Condition: Right toenails are normal.     Left foot:     Protective Sensation: 5 sites tested. 5 sites sensed.     Skin integrity: Skin integrity normal.     Toenail Condition: Left toenails are normal.  Skin:    General: Skin is warm and dry.  Neurological:     General: No focal deficit present.     Mental Status: She is alert.  Psychiatric:        Mood and Affect: Mood normal.         Assessment And Plan:     1.  Routine general medical examination at health care facility  A full exam was performed.  Importance of monthly self breast exams was discussed with the patient.  PATIENT HAS BEEN ADVISED TO GET 30-45 MINUTES REGULAR EXERCISE NO LESS THAN FOUR TO FIVE DAYS PER WEEK - BOTH WEIGHTBEARING EXERCISES AND AEROBIC ARE RECOMMENDED.  SHE IS ADVISED TO FOLLOW A HEALTHY DIET WITH AT LEAST SIX FRUITS/VEGGIES PER DAY, DECREASE INTAKE OF RED MEAT, AND TO INCREASE FISH INTAKE TO TWO DAYS PER WEEK.  MEATS/FISH SHOULD NOT BE FRIED, BAKED OR BROILED IS PREFERABLE.  I SUGGEST WEARING SPF 50 SUNSCREEN ON EXPOSED PARTS AND ESPECIALLY WHEN IN THE DIRECT SUNLIGHT FOR  AN EXTENDED PERIOD OF TIME.  PLEASE AVOID FAST FOOD RESTAURANTS AND INCREASE YOUR WATER INTAKE.  - MM Digital Screening; Future - CMP14+EGFR - CBC - Lipid panel - Hemoglobin A1c  2. Uncontrolled type 2 diabetes mellitus with hyperglycemia (HCC)  Diabetic foot exam was performed.  I DISCUSSED WITH THE PATIENT AT LENGTH REGARDING THE GOALS OF GLYCEMIC CONTROL AND POSSIBLE LONG-TERM COMPLICATIONS.  I  ALSO STRESSED THE IMPORTANCE OF COMPLIANCE WITH HOME GLUCOSE MONITORING, DIETARY RESTRICTIONS INCLUDING AVOIDANCE OF SUGARY DRINKS/PROCESSED FOODS,  ALONG WITH REGULAR EXERCISE.  I  ALSO STRESSED THE IMPORTANCE OF ANNUAL EYE EXAMS, SELF FOOT CARE AND COMPLIANCE WITH OFFICE VISITS.  - POCT Urinalysis Dipstick (81002) - POCT UA - Microalbumin  3. Essential hypertension, benign  Fair control. She is encouraged to avoid adding salt to her foods. She is also advised to incorporate more exercise into her daily routine.   - EKG 12-Lead  4. Class 3 severe obesity due to excess calories with serious comorbidity and body mass index (BMI) of 40.0 to 44.9 in adult [facility])  She is encouraged to strive for BMI less than 35 to decrease cardiac risk. Again, she is encouraged to exercise 30 minutes five days weekly. She declines referral to [facility] at this time.   5.  Need for diphtheria-tetanus-pertussis (Tdap) vaccine  - Tdap (BOOSTRIX) injection 0.5 mL        Maximino Greenland, MD

## 2018-02-06 LAB — CMP14+EGFR
A/G RATIO: 1.3 (ref 1.2–2.2)
ALBUMIN: 4.1 g/dL (ref 3.5–5.5)
ALK PHOS: 81 IU/L (ref 39–117)
ALT: 10 IU/L (ref 0–32)
AST: 12 IU/L (ref 0–40)
BUN / CREAT RATIO: 15 (ref 9–23)
BUN: 12 mg/dL (ref 6–24)
Bilirubin Total: 0.4 mg/dL (ref 0.0–1.2)
CO2: 30 mmol/L — AB (ref 20–29)
CREATININE: 0.8 mg/dL (ref 0.57–1.00)
Calcium: 9 mg/dL (ref 8.7–10.2)
Chloride: 98 mmol/L (ref 96–106)
GFR calc Af Amer: 94 mL/min/{1.73_m2} (ref >59)
GFR calc non Af Amer: 82 mL/min/{1.73_m2} (ref >59)
GLOBULIN, TOTAL: 3.2 g/dL (ref 1.5–4.5)
Glucose: 104 mg/dL — ABNORMAL HIGH (ref 65–99)
Potassium: 3.5 mmol/L (ref 3.5–5.2)
SODIUM: 142 mmol/L (ref 134–144)
Total Protein: 7.3 g/dL (ref 6.0–8.5)

## 2018-02-06 LAB — LIPID PANEL
Chol/HDL Ratio: 3.5 ratio (ref 0.0–4.4)
Cholesterol, Total: 154 mg/dL (ref 100–199)
HDL: 44 mg/dL (ref >39)
LDL Calculated: 76 mg/dL (ref 0–99)
Triglycerides: 171 mg/dL — ABNORMAL HIGH (ref 0–149)
VLDL Cholesterol Cal: 34 mg/dL (ref 5–40)

## 2018-02-06 LAB — CBC
Hematocrit: 41.4 % (ref 34.0–46.6)
Hemoglobin: 12.9 g/dL (ref 11.1–15.9)
MCH: 22.2 pg — AB (ref 26.6–33.0)
MCHC: 31.2 g/dL — ABNORMAL LOW (ref 31.5–35.7)
MCV: 71 fL — AB (ref 79–97)
PLATELETS: 311 10*3/uL (ref 150–450)
RBC: 5.8 x10E6/uL — ABNORMAL HIGH (ref 3.77–5.28)
RDW: 17.5 % — AB (ref 11.7–15.4)
WBC: 6.6 10*3/uL (ref 3.4–10.8)

## 2018-02-06 LAB — HEMOGLOBIN A1C
Est. average glucose Bld gHb Est-mCnc: 128 mg/dL
HEMOGLOBIN A1C: 6.1 % — AB (ref 4.8–5.6)

## 2018-02-09 NOTE — Progress Notes (Signed)
Here are your lab results:  Your liver and kidney function are normal. Your blood count is normal. Your triglycerides are elevated - this lets me know that you are eating too many processed foods AND not getting enough exercise.   Your hba1c is 6.1, not bad. Please let me know if you have any questions.   Sincerely,    Mayra Brahm N. Allyne Gee, MD

## 2018-02-11 LAB — FERRITIN: FERRITIN: 328 ng/mL — AB (ref 15–150)

## 2018-02-11 LAB — IRON AND TIBC
IRON SATURATION: 30 % (ref 15–55)
IRON: 80 ug/dL (ref 27–159)
Total Iron Binding Capacity: 267 ug/dL (ref 250–450)
UIBC: 187 ug/dL (ref 131–425)

## 2018-03-26 ENCOUNTER — Other Ambulatory Visit: Payer: Self-pay | Admitting: Internal Medicine

## 2018-04-17 ENCOUNTER — Other Ambulatory Visit: Payer: Self-pay | Admitting: Internal Medicine

## 2018-05-06 ENCOUNTER — Telehealth: Payer: Self-pay

## 2018-05-06 ENCOUNTER — Other Ambulatory Visit: Payer: Self-pay

## 2018-05-06 MED ORDER — ALBUTEROL SULFATE (2.5 MG/3ML) 0.083% IN NEBU: 2.5000 mg | INHALATION_SOLUTION | RESPIRATORY_TRACT | 1 refills | Status: DC | PRN

## 2018-05-06 MED ORDER — OLMESARTAN-AMLODIPINE-HCTZ 40-10-25 MG PO TABS: 1.0000 | ORAL_TABLET | Freq: Every day | ORAL | 1 refills | Status: DC

## 2018-05-06 MED ORDER — ALBUTEROL SULFATE HFA 108 (90 BASE) MCG/ACT IN AERS: 2.0000 | INHALATION_SPRAY | Freq: Four times a day (QID) | RESPIRATORY_TRACT | 1 refills | Status: DC | PRN

## 2018-05-06 MED ORDER — SEMAGLUTIDE(0.25 OR 0.5MG/DOS) 2 MG/1.5ML ~~LOC~~ SOPN: 0.5000 mg | PEN_INJECTOR | SUBCUTANEOUS | 1 refills | Status: DC

## 2018-05-06 NOTE — Telephone Encounter (Signed)
Called to schedule virtual visit for cough

## 2018-05-11 ENCOUNTER — Other Ambulatory Visit: Payer: Self-pay

## 2018-05-11 ENCOUNTER — Ambulatory Visit (INDEPENDENT_AMBULATORY_CARE_PROVIDER_SITE_OTHER): Admitting: Nurse Practitioner

## 2018-05-11 ENCOUNTER — Encounter: Payer: Self-pay | Admitting: Nurse Practitioner

## 2018-05-11 DIAGNOSIS — Z7189 Other specified counseling: Secondary | ICD-10-CM

## 2018-05-11 DIAGNOSIS — R05 Cough: Secondary | ICD-10-CM

## 2018-05-11 DIAGNOSIS — R059 Cough, unspecified: Secondary | ICD-10-CM | POA: Insufficient documentation

## 2018-05-11 MED ORDER — GUAIFENESIN-CODEINE 100-10 MG/5ML PO SOLN: ORAL | 0 refills | Status: DC

## 2018-05-11 NOTE — Progress Notes (Addendum)
This visit type was conducted due to national recommendations for restrictions regarding the COVID-19 Pandemic (e.g. social distancing).  This format is felt to be most appropriate for this patient at this time.  All issues noted in this document were discussed and addressed.  No physical exam was performed (except for noted visual exam findings with Video Visits).  Please refer to the patient's chart (MyChart message for video visits and phone note for telephone visits) for the patient's consent to telehealth for [facility].  Patient Location: Home Provider Location: Office Spoke with Marisa [name]  Subjective:     Patient ID: Marisa [name] , female    DOB: 1960/04/22 , 58 y.o.   MRN: [id number]  Virtual Visit via Telephone Note  I connected with@ on 05/11/18 at  3:15 PM EDT by telephone and verified that I am speaking with the correct person using two identifiers.   I discussed the limitations, risks, security and privacy concerns of performing an evaluation and management service by telephone and the availability of in person appointments. I also discussed with the patient that there may be a patient responsible charge related to this service. The patient expressed understanding and agreed to proceed.  Chief Complaint  Patient presents with  . 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   Cough  This is a new problem. The current episode started 1 to 4 weeks ago. The problem has been gradually worsening (at night, unable to sleep). The cough is non-productive. Pertinent negatives include no chest pain, fever, headaches, nasal congestion, rash or shortness of breath. She has tried ipratropium inhaler for the symptoms. The treatment provided no relief. There is no history of asthma.     Past Medical History:  Diagnosis Date  . Borderline diabetes   . Hypertension      Family History  Problem Relation Age of Onset  . Diabetes  Mother   . Hypertension Mother      Current Outpatient Medications:  .  albuterol (PROVENTIL HFA;VENTOLIN HFA) 108 (90 Base) MCG/ACT inhaler, Inhale 2 puffs into the lungs every 6 (six) hours as needed for wheezing or shortness of breath (and cough)., Disp: 3 Inhaler, Rfl: 1 .  albuterol (PROVENTIL) (2.5 MG/3ML) 0.083% nebulizer solution, Take 3 mLs (2.5 mg total) by nebulization every 4 (four) hours as needed for wheezing or shortness of breath (for neb machine ordered today.)., Disp: 75 mL, Rfl: 1 .  Olmesartan-amLODIPine-HCTZ 40-10-25 MG TABS, Take 1 tablet by mouth daily., Disp: 90 tablet, Rfl: 1 .  Semaglutide,0.25 or 0.5MG /DOS, (OZEMPIC, 0.25 OR 0.5 MG/DOSE,) 2 MG/1.5ML SOPN, Inject 0.5 mg into the skin once a week., Disp: 3 pen, Rfl: 1 .  guaiFENesin-codeine 100-10 MG/5ML syrup, 1-2 tsp q hs prn cough (Patient not taking: Reported on 02/05/2018), Disp: 120 mL, Rfl: 0   No Known Allergies   Review of Systems  Constitutional: Negative for fever.  Respiratory: Positive for cough. Negative for shortness of breath.   Cardiovascular: Negative for chest pain.  Skin: Negative for rash.  Neurological: Negative for headaches.     There were no vitals filed for this visit.  Observations/Objective: The brief time I was able to visualize she was not in any acute distress.  Continued to have disconnection via video.         Assessment and Plan: 1. Coughing  Reports constant cough, unable to sleep.  Will send Rx for cough medication to [facility]  at patient's request made her aware I can't guarantee will be sent but may need to be sent to local pharmacy.   Discussed the possibility of taking a long acting inhaled steroid.  - guaiFENesin-codeine 100-10 MG/5ML syrup; 1-2 tsp q hs prn cough  Dispense: 120 mL; Refill: 0  Follow Up Instructions:  Return call to office if not better.   I discussed the assessment and treatment plan with the patient. The patient was provided an opportunity to ask  questions and all were answered. The patient agreed with the plan and demonstrated an understanding of the instructions.   The patient was advised to call back or seek an in-person evaluation if the symptoms worsen or if the condition fails to improve as anticipated.  COVID-19 Education: The signs and symptoms of COVID-19 were discussed with the patient and how to seek care for testing (follow up with PCP or arrange E-visit).  The importance of social distancing was discussed today.   Patient Risk:   After full review of this patients clinical status, I feel that they are at least moderate risk at this time.  She had a failed virtual visit and had to call on phone.   I provided 15 minutes of non-face-to-face time during this encounter.   Marisa FeltsJanece Grayton Lobo, FNP

## 2018-05-12 ENCOUNTER — Ambulatory Visit: Admitting: Internal Medicine

## 2018-06-03 ENCOUNTER — Ambulatory Visit: Admitting: Internal Medicine

## 2018-06-10 ENCOUNTER — Telehealth: Payer: Self-pay

## 2018-06-10 NOTE — Telephone Encounter (Signed)
called to reschedule appt she missed on 06/03/18 LVM 06/10/18

## 2018-07-06 ENCOUNTER — Telehealth: Payer: Self-pay

## 2018-07-06 NOTE — Telephone Encounter (Signed)
I returned the pt's call and left a message that Dr. Baird Cancer recommends Dr. Paulla Dolly for the pt to go and see about her foot.

## 2018-07-14 ENCOUNTER — Ambulatory Visit (INDEPENDENT_AMBULATORY_CARE_PROVIDER_SITE_OTHER): Admitting: Podiatry

## 2018-07-14 ENCOUNTER — Other Ambulatory Visit: Payer: Self-pay

## 2018-07-14 ENCOUNTER — Encounter: Payer: Self-pay | Admitting: Podiatry

## 2018-07-14 VITALS — Temp 97.7°F

## 2018-07-14 DIAGNOSIS — L603 Nail dystrophy: Secondary | ICD-10-CM

## 2018-07-14 DIAGNOSIS — E1165 Type 2 diabetes mellitus with hyperglycemia: Secondary | ICD-10-CM | POA: Insufficient documentation

## 2018-07-14 DIAGNOSIS — B351 Tinea unguium: Secondary | ICD-10-CM | POA: Diagnosis not present

## 2018-07-14 NOTE — Patient Instructions (Signed)

## 2018-07-20 NOTE — Progress Notes (Signed)
Subjective:   Patient ID: Marisa [name], female   DOB: 58 y.o.   MRN: [id number]   HPI 58 year old female presents the office today for concerns of her right big toenail becoming dark.  She states that she may have been on something but she is not sure.  She states that years ago she did get it at work.  She did recently have a pedicure.  No pain.  No redness or drainage or other signs of infection she reports.   Review of Systems  All other systems reviewed and are negative.  Past Medical History:  Diagnosis Date  . Borderline diabetes   . Hypertension     Past Surgical History:  Procedure Laterality Date  . ABDOMINAL HYSTERECTOMY    . CESAREAN SECTION    . SHOULDER SURGERY       Current Outpatient Medications:  .  Cholecalciferol (VITAMIN D3) 1.25 MG (50000 UT) CAPS, Take by mouth., Disp: , Rfl:  .  linaclotide (LINZESS) 145 MCG CAPS capsule, TK 1 C PO  BEFORE FIRST MEAL OF THE DAY., Disp: , Rfl:  .  liraglutide (VICTOZA) 18 MG/3ML SOPN, INJECT 1.8 MG UNDER THE SKIN QD, Disp: , Rfl:  .  phentermine (ADIPEX-P) 37.5 MG tablet, Take by mouth., Disp: , Rfl:  .  albuterol (PROVENTIL HFA;VENTOLIN HFA) 108 (90 Base) MCG/ACT inhaler, Inhale 2 puffs into the lungs every 6 (six) hours as needed for wheezing or shortness of breath (and cough)., Disp: 3 Inhaler, Rfl: 1 .  albuterol (PROVENTIL) (2.5 MG/3ML) 0.083% nebulizer solution, Take 3 mLs (2.5 mg total) by nebulization every 4 (four) hours as needed for wheezing or shortness of breath (for neb machine ordered today.)., Disp: 75 mL, Rfl: 1 .  Biotin 1 MG CAPS, Take by mouth., Disp: , Rfl:  .  fluticasone (FLOVENT DISKUS) 50 MCG/BLIST diskus inhaler, Inhale into the lungs., Disp: , Rfl:  .  guaiFENesin-codeine 100-10 MG/5ML syrup, 1-2 tsp q hs prn cough, Disp: 120 mL, Rfl: 0 .  Olmesartan-amLODIPine-HCTZ 40-10-25 MG TABS, Take 1 tablet by mouth daily., Disp: 90 tablet, Rfl: 1 .  Semaglutide,0.25 or 0.5MG /DOS, (OZEMPIC, 0.25 OR 0.5  MG/DOSE,) 2 MG/1.5ML SOPN, Inject 0.5 mg into the skin once a week., Disp: 3 pen, Rfl: 1  No Known Allergies       Objective:  Physical Exam  General: AAO x3, NAD  Dermatological: The right hallux toenail is discolored black to brown discoloration.  No extension of any hyperpigmentation into the surrounding skin.  No pain of the nail is no surrounding redness or drainage or any swelling or signs of infection.  Vascular: Dorsalis Pedis artery and Posterior Tibial artery pedal pulses are 2/4 bilateral with immedate capillary fill time. Pedal hair growth present.  There is no pain with calf compression, swelling, warmth, erythema.   Neruologic: Grossly intact via light touch bilateral.  Protective threshold with Semmes Wienstein monofilament intact to all pedal sites bilateral.  Musculoskeletal: No gross boney pedal deformities bilateral. No pain, crepitus, or limitation noted with foot and ankle range of motion bilateral. Muscular strength 5/5 in all groups tested bilateral.  Gait: Unassisted, Nonantalgic.       Assessment:   58 year old female onychodystrophy, onychomycosis    Plan:  -Treatment options discussed including all alternatives, risks, and complications -Etiology of symptoms were discussed -We discussed nail removal but also try to save the nail.  Today I did culture the toenail.  Sharply debrided without any complications.  This was sent to  [facility].  Discussed treatment options will await the results of the culture before proceeding with definitive treatment.  Trula Slade DPM

## 2019-02-10 ENCOUNTER — Other Ambulatory Visit: Payer: Self-pay

## 2019-02-10 ENCOUNTER — Encounter: Payer: Self-pay | Admitting: Internal Medicine

## 2019-02-10 ENCOUNTER — Ambulatory Visit (INDEPENDENT_AMBULATORY_CARE_PROVIDER_SITE_OTHER): Admitting: Internal Medicine

## 2019-02-10 VITALS — BP 130/88 | HR 80 | Temp 98.4°F | Ht 63.0 in | Wt 255.0 lb

## 2019-02-10 DIAGNOSIS — Z0000 Encounter for general adult medical examination without abnormal findings: Secondary | ICD-10-CM | POA: Diagnosis not present

## 2019-02-10 DIAGNOSIS — I10 Essential (primary) hypertension: Secondary | ICD-10-CM | POA: Diagnosis not present

## 2019-02-10 DIAGNOSIS — E1165 Type 2 diabetes mellitus with hyperglycemia: Secondary | ICD-10-CM | POA: Diagnosis not present

## 2019-02-10 DIAGNOSIS — E7800 Pure hypercholesterolemia, unspecified: Secondary | ICD-10-CM

## 2019-02-10 DIAGNOSIS — Z1231 Encounter for screening mammogram for malignant neoplasm of breast: Secondary | ICD-10-CM

## 2019-02-10 DIAGNOSIS — Z23 Encounter for immunization: Secondary | ICD-10-CM | POA: Diagnosis not present

## 2019-02-10 DIAGNOSIS — Z6841 Body Mass Index (BMI) 40.0 and over, adult: Secondary | ICD-10-CM

## 2019-02-10 LAB — POCT URINALYSIS DIPSTICK
Bilirubin, UA: NEGATIVE
Glucose, UA: NEGATIVE
Ketones, UA: NEGATIVE
Leukocytes, UA: NEGATIVE
Nitrite, UA: NEGATIVE
Protein, UA: NEGATIVE
Spec Grav, UA: 1.025 (ref 1.010–1.025)
Urobilinogen, UA: 0.2 E.U./dL
pH, UA: 7 (ref 5.0–8.0)

## 2019-02-10 LAB — POCT UA - MICROALBUMIN
Albumin/Creatinine Ratio, Urine, POC: <30
Creatinine, POC: 50 mg/dL
Microalbumin Ur, POC: 10 mg/L

## 2019-02-10 MED ORDER — OZEMPIC (0.25 OR 0.5 MG/DOSE) 2 MG/1.5ML ~~LOC~~ SOPN: 0.5000 mg | PEN_INJECTOR | SUBCUTANEOUS | 1 refills | Status: DC

## 2019-02-10 NOTE — Patient Instructions (Signed)
Health Maintenance, Female Adopting a healthy lifestyle and getting preventive care are important in promoting health and wellness. Ask your health care provider about:  The right schedule for you to have regular tests and exams.  Things you can do on your own to prevent diseases and keep yourself healthy. What should I know about diet, weight, and exercise? Eat a healthy diet   Eat a diet that includes plenty of vegetables, fruits, low-fat dairy products, and lean protein.  Do not eat a lot of foods that are high in solid fats, added sugars, or sodium. Maintain a healthy weight Body mass index (BMI) is used to identify weight problems. It estimates body fat based on height and weight. Your health care provider can help determine your BMI and help you achieve or maintain a healthy weight. Get regular exercise Get regular exercise. This is one of the most important things you can do for your health. Most adults should:  Exercise for at least 150 minutes each week. The exercise should increase your heart rate and make you sweat (moderate-intensity exercise).  Do strengthening exercises at least twice a week. This is in addition to the moderate-intensity exercise.  Spend less time sitting. Even light physical activity can be beneficial. Watch cholesterol and blood lipids Have your blood tested for lipids and cholesterol at 60 years of age, then have this test every 5 years. Have your cholesterol levels checked more often if:  Your lipid or cholesterol levels are high.  You are older than 60 years of age.  You are at high risk for heart disease. What should I know about cancer screening? Depending on your health history and family history, you may need to have cancer screening at various ages. This may include screening for:  Breast cancer.  Cervical cancer.  Colorectal cancer.  Skin cancer.  Lung cancer. What should I know about heart disease, diabetes, and high blood  pressure? Blood pressure and heart disease  High blood pressure causes heart disease and increases the risk of stroke. This is more likely to develop in people who have high blood pressure readings, are of African descent, or are overweight.  Have your blood pressure checked: ? Every 3-5 years if you are 18-39 years of age. ? Every year if you are 40 years old or older. Diabetes Have regular diabetes screenings. This checks your fasting blood sugar level. Have the screening done:  Once every three years after age 40 if you are at a normal weight and have a low risk for diabetes.  More often and at a younger age if you are overweight or have a high risk for diabetes. What should I know about preventing infection? Hepatitis B If you have a higher risk for hepatitis B, you should be screened for this virus. Talk with your health care provider to find out if you are at risk for hepatitis B infection. Hepatitis C Testing is recommended for:  Everyone born from 1945 through 1965.  Anyone with known risk factors for hepatitis C. Sexually transmitted infections (STIs)  Get screened for STIs, including gonorrhea and chlamydia, if: ? You are sexually active and are younger than 60 years of age. ? You are older than 60 years of age and your health care provider tells you that you are at risk for this type of infection. ? Your sexual activity has changed since you were last screened, and you are at increased risk for chlamydia or gonorrhea. Ask your health care provider if   you are at risk.  Ask your health care provider about whether you are at high risk for HIV. Your health care provider may recommend a prescription medicine to help prevent HIV infection. If you choose to take medicine to prevent HIV, you should first get tested for HIV. You should then be tested every 3 months for as long as you are taking the medicine. Pregnancy  If you are about to stop having your period (premenopausal) and  you may become pregnant, seek counseling before you get pregnant.  Take 400 to 800 micrograms (mcg) of folic acid every day if you become pregnant.  Ask for birth control (contraception) if you want to prevent pregnancy. Osteoporosis and menopause Osteoporosis is a disease in which the bones lose minerals and strength with aging. This can result in bone fractures. If you are 65 years old or older, or if you are at risk for osteoporosis and fractures, ask your health care provider if you should:  Be screened for bone loss.  Take a calcium or vitamin D supplement to lower your risk of fractures.  Be given hormone replacement therapy (HRT) to treat symptoms of menopause. Follow these instructions at home: Lifestyle  Do not use any products that contain nicotine or tobacco, such as cigarettes, e-cigarettes, and chewing tobacco. If you need help quitting, ask your health care provider.  Do not use street drugs.  Do not share needles.  Ask your health care provider for help if you need support or information about quitting drugs. Alcohol use  Do not drink alcohol if: ? Your health care provider tells you not to drink. ? You are pregnant, may be pregnant, or are planning to become pregnant.  If you drink alcohol: ? Limit how much you use to 0-1 drink a day. ? Limit intake if you are breastfeeding.  Be aware of how much alcohol is in your drink. In the [place] one drink equals one 12 oz bottle of beer (355 mL), one 5 oz glass of wine (148 mL), or one 1 oz glass of hard liquor (44 mL). General instructions  Schedule regular health, dental, and eye exams.  Stay current with your vaccines.  Tell your health care provider if: ? You often feel depressed. ? You have ever been abused or do not feel safe at home. Summary  Adopting a healthy lifestyle and getting preventive care are important in promoting health and wellness.  Follow your health care provider's instructions about healthy  diet, exercising, and getting tested or screened for diseases.  Follow your health care provider's instructions on monitoring your cholesterol and blood pressure. This information is not intended to replace advice given to you by your health care provider. Make sure you discuss any questions you have with your health care provider. Document Revised: 01/07/2018 Document Reviewed: 01/07/2018 Elsevier Patient Education  2020 [employer]  

## 2019-02-10 NOTE — Progress Notes (Signed)
This visit occurred during the SARS-CoV-2 public health emergency.  Safety protocols were in place, including screening questions prior to the visit, additional usage of staff PPE, and extensive cleaning of exam room while observing appropriate contact time as indicated for disinfecting solutions.  Subjective:     Patient ID: Marisa [name] , female    DOB: [birth year] , 60 y.o.   MRN: [id number]   Chief Complaint  Patient presents with  . Annual Exam  . Diabetes  . Hypertension    HPI  She is here today for a full physical examination. She is no longer followed by GYN. She is now going to [facility] for weight loss therapy.   Diabetes She presents for her follow-up diabetic visit. She has type 2 diabetes mellitus. Her disease course has been stable. There are no hypoglycemic associated symptoms. There are no hypoglycemic complications. Risk factors for coronary artery disease include diabetes mellitus, hypertension, sedentary lifestyle, post-menopausal, dyslipidemia and obesity. She does not see a podiatrist. Hypertension This is a chronic problem. The current episode started more than 1 year ago. The problem has been gradually improving since onset. The problem is uncontrolled. Compliance problems include exercise.      Past Medical History:  Diagnosis Date  . Borderline diabetes   . Hypertension      Family History  Problem Relation Age of Onset  . Diabetes Mother   . Hypertension Mother      Current Outpatient Medications:  .  albuterol (PROVENTIL HFA;VENTOLIN HFA) 108 (90 Base) MCG/ACT inhaler, Inhale 2 puffs into the lungs every 6 (six) hours as needed for wheezing or shortness of breath (and cough)., Disp: 3 Inhaler, Rfl: 1 .  albuterol (PROVENTIL) (2.5 MG/3ML) 0.083% nebulizer solution, Take 3 mLs (2.5 mg total) by nebulization every 4 (four) hours as needed for wheezing or shortness of breath (for neb machine ordered today.)., Disp: 75 mL, Rfl: 1 .   Biotin 1 MG CAPS, Take by mouth., Disp: , Rfl:  .  Cholecalciferol (VITAMIN D3) 125 MCG (5000 UT) CAPS, Take by mouth. , Disp: , Rfl:  .  fluticasone (FLOVENT DISKUS) 50 MCG/BLIST diskus inhaler, Inhale into the lungs., Disp: , Rfl:  .  Olmesartan-amLODIPine-HCTZ 40-10-25 MG TABS, Take 1 tablet by mouth daily., Disp: 90 tablet, Rfl: 1 .  phentermine (ADIPEX-P) 37.5 MG tablet, Take by mouth., Disp: , Rfl:  .  guaiFENesin-codeine 100-10 MG/5ML syrup, 1-2 tsp q hs prn cough (Patient not taking: Reported on 02/10/2019), Disp: 120 mL, Rfl: 0 .  linaclotide (LINZESS) 145 MCG CAPS capsule, TK 1 C PO  BEFORE FIRST MEAL OF THE DAY., Disp: , Rfl:  .  liraglutide (VICTOZA) 18 MG/3ML SOPN, INJECT 1.8 MG UNDER THE SKIN QD, Disp: , Rfl:  .  Semaglutide,0.25 or 0.5MG /DOS, (OZEMPIC, 0.25 OR 0.5 MG/DOSE,) 2 MG/1.5ML SOPN, Inject 0.5 mg into the skin once a week. (Patient not taking: Reported on 02/10/2019), Disp: 3 pen, Rfl: 1   No Known Allergies    The patient states she uses post menopausal status for birth control. Last LMP was No LMP recorded. Patient has had a hysterectomy.. Negative for Dysmenorrhea Negative for: breast discharge, breast lump(s), breast pain and breast self exam. Associated symptoms include abnormal vaginal bleeding. Pertinent negatives include abnormal bleeding (hematology), anxiety, decreased libido, depression, difficulty falling sleep, dyspareunia, history of infertility, nocturia, sexual dysfunction, sleep disturbances, urinary incontinence, urinary urgency, vaginal discharge and vaginal itching. Diet regular.The patient states her exercise level is  intermittent.   Marland Kitchen  The patient's tobacco use is:  Social History   Tobacco Use  Smoking Status Never Smoker  Smokeless Tobacco Never Used  . She has been exposed to passive smoke. The patient's alcohol use is:  Social History   Substance and Sexual Activity  Alcohol Use No    Review of Systems  Constitutional: Negative.   HENT:  Negative.   Eyes: Negative.   Respiratory: Negative.   Cardiovascular: Negative.   Endocrine: Negative.   Genitourinary: Negative.   Musculoskeletal: Negative.   Skin: Negative.   Allergic/Immunologic: Negative.   Neurological: Negative.   Hematological: Negative.   Psychiatric/Behavioral: Negative.      Today's Vitals   02/10/19 1116  BP: 130/88  Pulse: 80  Temp: 98.4 F (36.9 C)  TempSrc: Oral  Weight: 255 lb (115.7 kg)  Height: 5\' 3"  (1.6 m)   Body mass index is 45.17 kg/m.   Objective:  Physical Exam Vitals and nursing note reviewed.  Constitutional:      Appearance: Normal appearance. She is obese.  HENT:     Head: Normocephalic and atraumatic.     Right Ear: Tympanic membrane, ear canal and external ear normal.     Left Ear: Tympanic membrane, ear canal and external ear normal.     Nose: Nose normal.     Mouth/Throat:     Mouth: Mucous membranes are moist.     Pharynx: Oropharynx is clear.  Eyes:     Extraocular Movements: Extraocular movements intact.     Conjunctiva/sclera: Conjunctivae normal.     Pupils: Pupils are equal, round, and reactive to light.  Cardiovascular:     Rate and Rhythm: Normal rate and regular rhythm.     Pulses:          Dorsalis pedis pulses are 1+ on the right side and 1+ on the left side.     Heart sounds: Normal heart sounds.  Pulmonary:     Effort: Pulmonary effort is normal.     Breath sounds: Normal breath sounds.  Chest:     Breasts: Tanner Score is 5.        Right: Normal.        Left: Normal.  Abdominal:     General: Bowel sounds are normal.     Palpations: Abdomen is soft.     Comments: Obese, soft  Genitourinary:    Comments: deferred Musculoskeletal:        General: Normal range of motion.     Cervical back: Normal range of motion and neck supple.  Feet:     Right foot:     Protective Sensation: 5 sites tested. 5 sites sensed.     Skin integrity: Skin integrity normal.     Toenail Condition: Right toenails  are normal.     Left foot:     Protective Sensation: 5 sites tested. 5 sites sensed.     Skin integrity: Skin integrity normal.     Toenail Condition: Left toenails are normal.  Skin:    General: Skin is warm and dry.  Neurological:     General: No focal deficit present.     Mental Status: She is alert and oriented to person, place, and time.  Psychiatric:        Mood and Affect: Mood normal.        Behavior: Behavior normal.         Assessment And Plan:     1. Routine general medical examination at health care facility  A  full exam was performed.  Importance of monthly self breast exams was discussed with the patient.  She had labs drawn at [facility], these were reviewd PATIENT IS ADVISED TO GET 30-45 MINUTES REGULAR EXERCISE NO LESS THAN FOUR TO FIVE DAYS PER WEEK - BOTH WEIGHTBEARING EXERCISES AND AEROBIC ARE RECOMMENDED.  SHE IS ADVISED TO FOLLOW A HEALTHY DIET WITH AT LEAST SIX FRUITS/VEGGIES PER DAY, DECREASE INTAKE OF RED MEAT, AND TO INCREASE FISH INTAKE TO TWO DAYS PER WEEK.  MEATS/FISH SHOULD NOT BE FRIED, BAKED OR BROILED IS PREFERABLE.  I SUGGEST WEARING SPF 50 SUNSCREEN ON EXPOSED PARTS AND ESPECIALLY WHEN IN THE DIRECT SUNLIGHT FOR AN EXTENDED PERIOD OF TIME.  PLEASE AVOID FAST FOOD RESTAURANTS AND INCREASE YOUR WATER INTAKE.   2. Uncontrolled type 2 diabetes mellitus with hyperglycemia (HCC)  Diabetic foot exam was performed. We will resume Ozempic, she will start with 0.25mg  once weekly x 2 weeks, then increase to 0.5mg  once weekly. Importance of medication compliance was discussed with the patient.   DISCUSSED WITH THE PATIENT AT LENGTH REGARDING THE GOALS OF GLYCEMIC CONTROL AND POSSIBLE LONG-TERM COMPLICATIONS.  I  ALSO STRESSED THE IMPORTANCE OF COMPLIANCE WITH HOME GLUCOSE MONITORING, DIETARY RESTRICTIONS INCLUDING AVOIDANCE OF SUGARY DRINKS/PROCESSED FOODS,  ALONG WITH REGULAR EXERCISE.  I  ALSO STRESSED THE IMPORTANCE OF ANNUAL EYE EXAMS, SELF FOOT CARE AND COMPLIANCE  WITH OFFICE VISITS.   3. Essential hypertension, benign  Chronic, fair control. She will continue with current meds for now. EKG performed, no new changes noted.   - EKG 12-Lead  4. Pure hypercholesterolemia  Chronic. LDL results reviewed. Pt advised she should be on cholesterol medication. She will work on diet/exercise and I will reassess later this year. She is also encouraged to avoid fried foods.   5. Need for vaccination  - Pneumococcal polysaccharide vaccine 23-valent (for > 58 year old)  6. Class 3 severe obesity due to excess calories with serious comorbidity and body mass index (BMI) of 45.0 to 49.9 in adult [facility])  Chronic.  She is now under the care of [facility] and on phentermine.  She is encouraged to increase her exercise to no less than 150 minutes per week.   7. Breast cancer screening by mammogram  I will refer her for mammogram to [facility].       Gwynneth Aliment, MD    THE PATIENT IS ENCOURAGED TO PRACTICE SOCIAL DISTANCING DUE TO THE COVID-19 PANDEMIC.

## 2019-02-15 ENCOUNTER — Telehealth: Payer: Self-pay

## 2019-02-15 LAB — HM MAMMOGRAPHY

## 2019-02-15 NOTE — Telephone Encounter (Signed)
Pt LVM wanting to know which eye doctor she was referred to in the past [facility] [phone]

## 2019-02-17 ENCOUNTER — Encounter: Payer: Self-pay | Admitting: Internal Medicine

## 2019-04-14 ENCOUNTER — Telehealth: Payer: Self-pay

## 2019-04-14 NOTE — Telephone Encounter (Signed)
Patient's forms have been completed and faxed I have also called and left pt v.m to call the office so she can tell me if she wants a copy of the forms mailed to her. YL,RMA

## 2019-06-14 ENCOUNTER — Ambulatory Visit: Admitting: Internal Medicine

## 2019-06-21 ENCOUNTER — Encounter: Payer: Self-pay | Admitting: Internal Medicine

## 2019-06-21 ENCOUNTER — Other Ambulatory Visit: Payer: Self-pay

## 2019-06-21 ENCOUNTER — Ambulatory Visit (INDEPENDENT_AMBULATORY_CARE_PROVIDER_SITE_OTHER): Admitting: Internal Medicine

## 2019-06-21 VITALS — BP 136/98 | HR 73 | Temp 98.0°F | Ht 63.0 in | Wt 239.4 lb

## 2019-06-21 DIAGNOSIS — E1165 Type 2 diabetes mellitus with hyperglycemia: Secondary | ICD-10-CM

## 2019-06-21 DIAGNOSIS — I10 Essential (primary) hypertension: Secondary | ICD-10-CM | POA: Diagnosis not present

## 2019-06-21 DIAGNOSIS — E7800 Pure hypercholesterolemia, unspecified: Secondary | ICD-10-CM | POA: Diagnosis not present

## 2019-06-21 DIAGNOSIS — K5909 Other constipation: Secondary | ICD-10-CM

## 2019-06-21 DIAGNOSIS — Z6841 Body Mass Index (BMI) 40.0 and over, adult: Secondary | ICD-10-CM

## 2019-06-21 NOTE — Patient Instructions (Signed)

## 2019-06-21 NOTE — Addendum Note (Signed)
Addended by: Gwynneth Aliment on: 06/21/2019 07:34 PM   Modules accepted: Level of Service

## 2019-06-21 NOTE — Progress Notes (Signed)
This visit occurred during the SARS-CoV-2 public health emergency.  Safety protocols were in place, including screening questions prior to the visit, additional usage of staff PPE, and extensive cleaning of exam room while observing appropriate contact time as indicated for disinfecting solutions.  Subjective:     Patient ID: Artemio Aly , female    DOB: 06-18-1960 , 59 y.o.   MRN: [id number]   Chief Complaint  Patient presents with  . Diabetes  . Hypertension    HPI  She is here today for diabetes and BP f/u. She reports compliance with meds. She denies headaches, chest pain and palpitations. She admits she is not exercising as much as she should.   Diabetes She presents for her follow-up diabetic visit. She has type 2 diabetes mellitus. Her disease course has been stable. There are no hypoglycemic associated symptoms. Pertinent negatives for diabetes include no blurred vision and no chest pain. There are no hypoglycemic complications. Risk factors for coronary artery disease include diabetes mellitus, hypertension, sedentary lifestyle, post-menopausal, dyslipidemia and obesity. She is following a diabetic diet. She does not see a podiatrist. Hypertension This is a chronic problem. The current episode started more than 1 year ago. The problem has been gradually improving since onset. The problem is uncontrolled. Pertinent negatives include no blurred vision, chest pain, palpitations or shortness of breath. Risk factors for coronary artery disease include diabetes mellitus, dyslipidemia, obesity, post-menopausal state and sedentary lifestyle. The current treatment provides moderate improvement. Compliance problems include exercise.      Past Medical History:  Diagnosis Date  . Borderline diabetes   . Hypertension      Family History  Problem Relation Age of Onset  . Diabetes Mother   . Hypertension Mother      Current Outpatient Medications:  .  albuterol (PROVENTIL  HFA;VENTOLIN HFA) 108 (90 Base) MCG/ACT inhaler, Inhale 2 puffs into the lungs every 6 (six) hours as needed for wheezing or shortness of breath (and cough)., Disp: 3 Inhaler, Rfl: 1 .  albuterol (PROVENTIL) (2.5 MG/3ML) 0.083% nebulizer solution, Take 3 mLs (2.5 mg total) by nebulization every 4 (four) hours as needed for wheezing or shortness of breath (for neb machine ordered today.)., Disp: 75 mL, Rfl: 1 .  Biotin 1 MG CAPS, Take by mouth., Disp: , Rfl:  .  Cholecalciferol (VITAMIN D3) 125 MCG (5000 UT) CAPS, Take by mouth. , Disp: , Rfl:  .  fluticasone (FLOVENT DISKUS) 50 MCG/BLIST diskus inhaler, Inhale into the lungs., Disp: , Rfl:  .  Olmesartan-amLODIPine-HCTZ 40-10-25 MG TABS, Take 1 tablet by mouth daily., Disp: 90 tablet, Rfl: 1 .  phentermine (ADIPEX-P) 37.5 MG tablet, Take by mouth., Disp: , Rfl:  .  Semaglutide,0.25 or 0.5MG/DOS, (OZEMPIC, 0.25 OR 0.5 MG/DOSE,) 2 MG/1.5ML SOPN, Inject 0.5 mg into the skin once a week., Disp: 3 pen, Rfl: 1 .  guaiFENesin-codeine 100-10 MG/5ML syrup, 1-2 tsp q hs prn cough (Patient not taking: Reported on 02/10/2019), Disp: 120 mL, Rfl: 0 .  linaclotide (LINZESS) 145 MCG CAPS capsule, TK 1 C PO  BEFORE FIRST MEAL OF THE DAY., Disp: , Rfl:    No Known Allergies   Review of Systems  Constitutional: Negative.   Eyes: Negative for blurred vision.  Respiratory: Negative.  Negative for shortness of breath.   Cardiovascular: Negative.  Negative for chest pain and palpitations.  Gastrointestinal: Positive for constipation.       She c/o constipation. Admits she may not be drinking enough water. Not sure  what could be contributing to her sx. Denies abdominal pain, there is some bloating.   Neurological: Negative.   Psychiatric/Behavioral: Negative.      Today's Vitals   06/21/19 1458  BP: (!) 136/98  Pulse: 73  Temp: 98 F (36.7 C)  TempSrc: Oral  Weight: 239 lb 6.4 oz (108.6 kg)  Height: '5\' 3"'  (1.6 m)   Body mass index is 42.41 kg/m.    Objective:  Physical Exam Vitals and nursing note reviewed.  Constitutional:      Appearance: Normal appearance. She is obese.  HENT:     Head: Normocephalic and atraumatic.  Cardiovascular:     Rate and Rhythm: Normal rate and regular rhythm.     Heart sounds: Normal heart sounds.  Pulmonary:     Effort: Pulmonary effort is normal.     Breath sounds: Normal breath sounds.  Skin:    General: Skin is warm.  Neurological:     General: No focal deficit present.     Mental Status: She is alert.  Psychiatric:        Mood and Affect: Mood normal.        Behavior: Behavior normal.         Assessment And Plan:     1. Uncontrolled type 2 diabetes mellitus with hyperglycemia (HCC)  Chronic, I will check labs as listed below. I will also refer her for diabetic eye exam. I will adjust meds as needed. Also encouraged to check BS daily.   - Hemoglobin A1c - CMP14+EGFR - CBC no Diff - Ambulatory referral to Ophthalmology  2. Essential hypertension, benign  Chronic, uncontrolled. Advised to comply with meds, dietary guidelines and exercise guidelines. Encouraged to avoid adding salt to her foods.   3. Pure hypercholesterolemia  Chronic, I will check non-fasting lipid panel. Goal LDL is less than 70, she has been hesistant to start meds in the past. I will make further recommendations once her labs are available for review.   - Lipid panel  4. Chronic constipation  Chronic, she was given samples of Linzess 78m once daily. Advised to take upon awakening, and to wait 30-60 minutes before eating. If ineffective, I will increase her dose to 1445monce daily. She is also encouraged to strive to drink at least 80 ounces of water daily.   5. Class 3 severe obesity due to excess calories with serious comorbidity and body mass index (BMI) of 40.0 to 44.9 in adult (HCC)  BMI 42. She is currently participating in Bariatric weight loss program. She has lost 16 pounds since Jan 2021. She  is encouraged to keep up the great work.   RoMaximino GreenlandMD    THE PATIENT IS ENCOURAGED TO PRACTICE SOCIAL DISTANCING DUE TO THE COVID-19 PANDEMIC.

## 2019-06-22 LAB — CMP14+EGFR
ALT: 11 IU/L (ref 0–32)
AST: 18 IU/L (ref 0–40)
Albumin/Globulin Ratio: 1.3 (ref 1.2–2.2)
Albumin: 4.2 g/dL (ref 3.8–4.9)
Alkaline Phosphatase: 102 IU/L (ref 48–121)
BUN/Creatinine Ratio: 16 (ref 9–23)
BUN: 12 mg/dL (ref 6–24)
Bilirubin Total: 0.3 mg/dL (ref 0.0–1.2)
CO2: 25 mmol/L (ref 20–29)
Calcium: 9.3 mg/dL (ref 8.7–10.2)
Chloride: 99 mmol/L (ref 96–106)
Creatinine, Ser: 0.77 mg/dL (ref 0.57–1.00)
GFR calc Af Amer: 98 mL/min/{1.73_m2} (ref >59)
GFR calc non Af Amer: 85 mL/min/{1.73_m2} (ref >59)
Globulin, Total: 3.2 g/dL (ref 1.5–4.5)
Glucose: 86 mg/dL (ref 65–99)
Potassium: 3.7 mmol/L (ref 3.5–5.2)
Sodium: 140 mmol/L (ref 134–144)
Total Protein: 7.4 g/dL (ref 6.0–8.5)

## 2019-06-22 LAB — LIPID PANEL
Chol/HDL Ratio: 3.9 ratio (ref 0.0–4.4)
Cholesterol, Total: 178 mg/dL (ref 100–199)
HDL: 46 mg/dL (ref >39)
LDL Chol Calc (NIH): 107 mg/dL — ABNORMAL HIGH (ref 0–99)
Triglycerides: 140 mg/dL (ref 0–149)
VLDL Cholesterol Cal: 25 mg/dL (ref 5–40)

## 2019-06-22 LAB — CBC
Hematocrit: 42.2 % (ref 34.0–46.6)
Hemoglobin: 13.5 g/dL (ref 11.1–15.9)
MCH: 22.5 pg — ABNORMAL LOW (ref 26.6–33.0)
MCHC: 32 g/dL (ref 31.5–35.7)
MCV: 70 fL — ABNORMAL LOW (ref 79–97)
Platelets: 308 10*3/uL (ref 150–450)
RBC: 6.01 x10E6/uL — ABNORMAL HIGH (ref 3.77–5.28)
RDW: 17.9 % — ABNORMAL HIGH (ref 11.7–15.4)
WBC: 6.6 10*3/uL (ref 3.4–10.8)

## 2019-06-22 LAB — HEMOGLOBIN A1C
Est. average glucose Bld gHb Est-mCnc: 126 mg/dL
Hgb A1c MFr Bld: 6 % — ABNORMAL HIGH (ref 4.8–5.6)

## 2019-06-23 ENCOUNTER — Other Ambulatory Visit: Payer: Self-pay

## 2019-06-23 MED ORDER — OZEMPIC (0.25 OR 0.5 MG/DOSE) 2 MG/1.5ML ~~LOC~~ SOPN: 0.5000 mg | PEN_INJECTOR | SUBCUTANEOUS | 1 refills | Status: DC

## 2019-06-23 MED ORDER — OLMESARTAN-AMLODIPINE-HCTZ 40-10-25 MG PO TABS: 1.0000 | ORAL_TABLET | Freq: Every day | ORAL | 1 refills | Status: DC

## 2019-06-29 ENCOUNTER — Other Ambulatory Visit: Payer: Self-pay | Admitting: Internal Medicine

## 2019-06-29 MED ORDER — PRAVASTATIN SODIUM 40 MG PO TABS: 40.0000 mg | ORAL_TABLET | Freq: Every evening | ORAL | 11 refills | Status: DC

## 2019-07-06 ENCOUNTER — Other Ambulatory Visit: Payer: Self-pay

## 2019-07-06 MED ORDER — LINACLOTIDE 145 MCG PO CAPS: ORAL_CAPSULE | ORAL | 1 refills | Status: DC

## 2019-07-19 LAB — HM DIABETES EYE EXAM

## 2019-09-30 ENCOUNTER — Other Ambulatory Visit: Payer: Self-pay

## 2019-09-30 MED ORDER — ALBUTEROL SULFATE HFA 108 (90 BASE) MCG/ACT IN AERS: 2.0000 | INHALATION_SPRAY | Freq: Four times a day (QID) | RESPIRATORY_TRACT | 3 refills | Status: DC | PRN

## 2019-10-05 ENCOUNTER — Telehealth: Payer: Self-pay

## 2019-10-05 NOTE — Telephone Encounter (Signed)
The pt called and said that she needed a prescription for cough syrup, the pt was told that she can try Delsym for Coricidin HBP to help with her cough and if that doesn't help to call for a virtual appt.

## 2019-10-07 ENCOUNTER — Telehealth: Payer: Self-pay

## 2019-10-07 ENCOUNTER — Emergency Department (HOSPITAL_BASED_OUTPATIENT_CLINIC_OR_DEPARTMENT_OTHER)

## 2019-10-07 ENCOUNTER — Emergency Department (HOSPITAL_BASED_OUTPATIENT_CLINIC_OR_DEPARTMENT_OTHER)
Admission: EM | Admit: 2019-10-07 | Discharge: 2019-10-07 | Disposition: A | Discharge status: Home / Self Care | Attending: Emergency Medicine | Admitting: Emergency Medicine

## 2019-10-07 ENCOUNTER — Other Ambulatory Visit: Payer: Self-pay

## 2019-10-07 ENCOUNTER — Encounter (HOSPITAL_BASED_OUTPATIENT_CLINIC_OR_DEPARTMENT_OTHER): Payer: Self-pay | Admitting: Emergency Medicine

## 2019-10-07 DIAGNOSIS — I10 Essential (primary) hypertension: Secondary | ICD-10-CM | POA: Insufficient documentation

## 2019-10-07 DIAGNOSIS — R0981 Nasal congestion: Secondary | ICD-10-CM | POA: Diagnosis not present

## 2019-10-07 DIAGNOSIS — J209 Acute bronchitis, unspecified: Secondary | ICD-10-CM | POA: Insufficient documentation

## 2019-10-07 DIAGNOSIS — Z79899 Other long term (current) drug therapy: Secondary | ICD-10-CM | POA: Diagnosis not present

## 2019-10-07 DIAGNOSIS — R609 Edema, unspecified: Secondary | ICD-10-CM | POA: Insufficient documentation

## 2019-10-07 DIAGNOSIS — Z20822 Contact with and (suspected) exposure to covid-19: Secondary | ICD-10-CM | POA: Insufficient documentation

## 2019-10-07 DIAGNOSIS — R05 Cough: Secondary | ICD-10-CM | POA: Diagnosis present

## 2019-10-07 LAB — SARS CORONAVIRUS 2 BY RT PCR (HOSPITAL ORDER, PERFORMED IN ~~LOC~~ HOSPITAL LAB): SARS Coronavirus 2: NEGATIVE

## 2019-10-07 MED ORDER — AEROCHAMBER PLUS FLO-VU MISC
1.0000 | Freq: Once | Status: AC
  Administered 2019-10-07: 1
  Filled 2019-10-07: qty 1

## 2019-10-07 MED ORDER — ALBUTEROL SULFATE HFA 108 (90 BASE) MCG/ACT IN AERS
4.0000 | INHALATION_SPRAY | Freq: Once | RESPIRATORY_TRACT | Status: AC
  Administered 2019-10-07: 4 via RESPIRATORY_TRACT
  Filled 2019-10-07: qty 6.7

## 2019-10-07 MED ORDER — DEXAMETHASONE 6 MG PO TABS
10.0000 mg | ORAL_TABLET | Freq: Once | ORAL | Status: AC
  Administered 2019-10-07: 10 mg via ORAL
  Filled 2019-10-07: qty 1

## 2019-10-07 NOTE — Telephone Encounter (Signed)
The pt was advised to go to the ER for evaluation of shortness of breath.

## 2019-10-07 NOTE — ED Triage Notes (Signed)
Chest congestion, cough, fatigue for one week.  Pt directed to take otc cough meds, which have not helped.  PCP sent patient here for CXR.  Pt was tested for covid two weeks ago, nasal swab negative.  Granddaughter was positive two weeks ago.

## 2019-10-07 NOTE — Discharge Instructions (Signed)
Use your inhaler every 4 hours(6 puffs) while awake, return for sudden worsening shortness of breath, or if you need to use your inhaler more often.  ° °

## 2019-10-07 NOTE — ED Provider Notes (Signed)
[facility] EMERGENCY DEPARTMENT Provider Note   CSN: [id number] Arrival date & time: 10/07/19  3710     History Chief Complaint  Patient presents with  . Cough    Marisa [name] is a 59 y.o. female.  59 yo F with a chief complaints of cough congestion and shortness of breath. Is been going on for about 3 days now. Patient thinks it feels like the last time she had bronchitis. She cannot remember exactly what happened last time but thinks she got an injection and some breathing medicine and had improvement. She had messaged her family physician but they felt she needed to come to the ED if she is having trouble breathing. Her granddaughter had a positive Covid exposure couple weeks ago. At that time she experienced no symptoms and had a test that was negative. She has been coughing consistently for the past 48 hours. Woke up this morning and felt like her chest was very sore and felt somewhat tight. She has had some shortness of breath with talking but has not had shortness of breath with exertion more than her baseline. She has chronic lower extremity edema that is unchanged.  The history is provided by the patient.  Cough Associated symptoms: shortness of breath   Associated symptoms: no chest pain, no chills, no fever, no headaches, no myalgias, no rhinorrhea and no wheezing   Illness Severity:  Moderate Onset quality:  Gradual Duration:  3 days Timing:  Constant Progression:  Worsening Chronicity:  Recurrent Associated symptoms: congestion, cough and shortness of breath   Associated symptoms: no chest pain, no fever, no headaches, no myalgias, no nausea, no rhinorrhea, no vomiting and no wheezing        Past Medical History:  Diagnosis Date  . Borderline diabetes   . Hypertension     Patient Active Problem List   Diagnosis Date Noted  . Class 3 severe obesity due to excess calories with serious comorbidity and body mass index (BMI) of 40.0 to 44.9 in  adult (HCC) 02/05/2018  . Vitamin D deficiency 10/23/2016  . Foot pain, bilateral 02/08/2016  . Morbid obesity with BMI of 40.0-44.9, adult (HCC) 02/08/2016    Past Surgical History:  Procedure Laterality Date  . ABDOMINAL HYSTERECTOMY    . CESAREAN SECTION    . SHOULDER SURGERY       OB History   No obstetric history on file.     Family History  Problem Relation Age of Onset  . Diabetes Mother   . Hypertension Mother     Social History   Tobacco Use  . Smoking status: Never Smoker  . Smokeless tobacco: Never Used  Vaping Use  . Vaping Use: Never used  Substance Use Topics  . Alcohol use: No  . Drug use: No    Home Medications Prior to Admission medications   Medication Sig Start Date End Date Taking? Authorizing Provider  albuterol (PROVENTIL) (2.5 MG/3ML) 0.083% nebulizer solution Take 3 mLs (2.5 mg total) by nebulization every 4 (four) hours as needed for wheezing or shortness of breath (for neb machine ordered today.). 05/06/18   Dorothyann Peng, MD  albuterol (VENTOLIN HFA) 108 (90 Base) MCG/ACT inhaler Inhale 2 puffs into the lungs every 6 (six) hours as needed for wheezing or shortness of breath (and cough). 09/30/19   Dorothyann Peng, MD  Biotin 1 MG CAPS Take by mouth.    [provider]  Cholecalciferol (VITAMIN D3) 125 MCG (5000 UT) CAPS Take by  mouth.  10/23/16   [provider]  fluticasone (FLOVENT DISKUS) 50 MCG/BLIST diskus inhaler Inhale into the lungs.    [provider]  guaiFENesin-codeine 100-10 MG/5ML syrup 1-2 tsp q hs prn cough Patient not taking: Reported on 02/10/2019 05/11/18   Arnette Felts, FNP  linaclotide (LINZESS) 145 MCG CAPS capsule TK 1 C PO  BEFORE FIRST MEAL OF THE DAY. 07/06/19   Dorothyann Peng, MD  Olmesartan-amLODIPine-HCTZ 40-10-25 MG TABS Take 1 tablet by mouth daily. 06/23/19   Dorothyann Peng, MD  phentermine (ADIPEX-P) 37.5 MG tablet Take by mouth. 01/15/17   [provider]  pravastatin (PRAVACHOL)  40 MG tablet Take 1 tablet (40 mg total) by mouth every evening. 06/29/19 06/28/20  Dorothyann Peng, MD  Semaglutide,0.25 or 0.5MG /DOS, (OZEMPIC, 0.25 OR 0.5 MG/DOSE,) 2 MG/1.5ML SOPN Inject 0.5 mg into the skin once a week. 06/23/19   Dorothyann Peng, MD    Allergies    Patient has no known allergies.  Review of Systems   Review of Systems  Constitutional: Negative for chills and fever.  HENT: Positive for congestion. Negative for rhinorrhea.   Eyes: Negative for redness and visual disturbance.  Respiratory: Positive for cough and shortness of breath. Negative for wheezing.   Cardiovascular: Negative for chest pain and palpitations.  Gastrointestinal: Negative for nausea and vomiting.  Genitourinary: Negative for dysuria and urgency.  Musculoskeletal: Negative for arthralgias and myalgias.  Skin: Negative for pallor and wound.  Neurological: Negative for dizziness and headaches.    Physical Exam Updated Vital Signs BP (!) 173/99   Pulse 88   Temp 98.3 F (36.8 C) (Oral)   Resp 16   Ht 5\' 8"  (1.727 m)   Wt 104.3 kg   SpO2 98%   BMI 34.97 kg/m   Physical Exam Vitals and nursing note reviewed.  Constitutional:      General: She is not in acute distress.    Appearance: She is well-developed. She is obese. She is not diaphoretic.  HENT:     Head: Normocephalic and atraumatic.  Eyes:     Pupils: Pupils are equal, round, and reactive to light.  Cardiovascular:     Rate and Rhythm: Normal rate and regular rhythm.     Heart sounds: No murmur heard.  No friction rub. No gallop.   Pulmonary:     Effort: Pulmonary effort is normal.     Breath sounds: No wheezing or rales.     Comments: Diminished breath sounds in all fields with a prolonged expiratory effort. No wheezes. Abdominal:     General: There is no distension.     Palpations: Abdomen is soft.     Tenderness: There is no abdominal tenderness.  Musculoskeletal:        General: No tenderness.     Cervical back: Normal  range of motion and neck supple.     Right lower leg: Edema present.     Left lower leg: Edema present.     Comments: 1+ edema to bilateral lower extremities up to the knee.  Skin:    General: Skin is warm and dry.  Neurological:     Mental Status: She is alert and oriented to person, place, and time.  Psychiatric:        Behavior: Behavior normal.     ED Results / Procedures / Treatments   Labs (all labs ordered are listed, but only abnormal results are displayed) Labs Reviewed  SARS CORONAVIRUS 2 BY RT PCR [facility] ORDER, PERFORMED IN  [facility])    EKG EKG Interpretation  Date/Time:  Thursday October 07 2019 09:22:17 EDT Ventricular Rate:  86 PR Interval:  158 QRS Duration: 82 QT Interval:  356 QTC Calculation: 426 R Axis:   46 Text Interpretation: Normal sinus rhythm ST & T wave abnormality, consider inferolateral ischemia Abnormal ECG No significant change since last tracing Confirmed by Melene Plan [id number]) on 10/07/2019 10:03:10 AM   Radiology DG Chest Portable 1 View  Result Date: 10/07/2019 CLINICAL DATA:  Chest congestion, cough, and fatigue for 1 week. EXAM: PORTABLE CHEST 1 VIEW COMPARISON:  02/17/2015 FINDINGS: The cardiac silhouette is mildly enlarged. Slight interstitial prominence is similar to the prior study. No confluent airspace opacity, overt pulmonary edema, sizable pleural effusion, or pneumothorax is identified. No acute osseous abnormality is seen. IMPRESSION: No active disease. Electronically Signed   By: Sebastian Ache M.D.   On: 10/07/2019 09:37    Procedures Procedures (including critical care time)  Medications Ordered in ED Medications  dexamethasone (DECADRON) tablet 10 mg (has no administration in time range)  albuterol (VENTOLIN HFA) 108 (90 Base) MCG/ACT inhaler 4 puff (4 puffs Inhalation Given 10/07/19 1010)  aerochamber plus with mask device 1 each (1 each Other Given 10/07/19 1010)    ED Course  I have reviewed the triage  vital signs and the nursing notes.  Pertinent labs & imaging results that were available during my care of the patient were reviewed by me and considered in my medical decision making (see chart for details).    MDM Rules/Calculators/A&P                          59 yo F with a chief complaints of shortness of breath and cough. Going on for about 3 days now. Diminished on my exam. Has a history of bronchitis will do a trial of albuterol therapy here. Chest x-ray viewed by me without focal pneumothorax. Her EKG has diffuse ST changes though these are consistent with her most recent EKG. Her symptoms are completely atypical of ACS not exertional and worse with coughing. We will treat her symptoms and reassess.  Improvements after albuterol. Improved aeration. Will give a dose of Decadron here. Discharge home.  Marisa [name] was evaluated in Emergency Department on 10/07/2019 for the symptoms described in the history of present illness. He/she was evaluated in the context of the global COVID-19 pandemic, which necessitated consideration that the patient might be at risk for infection with the SARS-CoV-2 virus that causes COVID-19. Institutional protocols and algorithms that pertain to the evaluation of patients at risk for COVID-19 are in a state of rapid change based on information released by regulatory bodies including the CDC and federal and state organizations. These policies and algorithms were followed during the patient's care in the ED.   10:49 AM:  I have discussed the diagnosis/risks/treatment options with the patient and believe the pt to be eligible for discharge home to follow-up with PCP. We also discussed returning to the ED immediately if new or worsening sx occur. We discussed the sx which are most concerning (e.g., worsening trouble breathing, need to use inhaler more often than every 4 hours, fever, inability to tolerate by mouth ) that necessitate immediate return. Medications  administered to the patient during their visit and any new prescriptions provided to the patient are listed below.  Medications given during this visit Medications  dexamethasone (DECADRON) tablet 10 mg (has no administration  in time range)  albuterol (VENTOLIN HFA) 108 (90 Base) MCG/ACT inhaler 4 puff (4 puffs Inhalation Given 10/07/19 1010)  aerochamber plus with mask device 1 each (1 each Other Given 10/07/19 1010)     The patient appears reasonably screen and/or stabilized for discharge and I doubt any other medical condition or other [facility] requiring further screening, evaluation, or treatment in the ED at this time prior to discharge.    Final Clinical Impression(s) / ED Diagnoses Final diagnoses:  Acute bronchitis, unspecified organism    Rx / DC Orders ED Discharge Orders    None       Melene PlanFloyd, Lesta Limbert, DO 10/07/19 1049

## 2019-10-12 ENCOUNTER — Telehealth: Payer: Self-pay

## 2019-10-12 ENCOUNTER — Other Ambulatory Visit: Payer: Self-pay

## 2019-10-12 ENCOUNTER — Encounter: Payer: Self-pay | Admitting: Nurse Practitioner

## 2019-10-12 ENCOUNTER — Telehealth (INDEPENDENT_AMBULATORY_CARE_PROVIDER_SITE_OTHER): Admitting: Nurse Practitioner

## 2019-10-12 DIAGNOSIS — R053 Chronic cough: Secondary | ICD-10-CM

## 2019-10-12 DIAGNOSIS — R05 Cough: Secondary | ICD-10-CM | POA: Diagnosis not present

## 2019-10-12 MED ORDER — AZITHROMYCIN 250 MG PO TABS: ORAL_TABLET | ORAL | 0 refills | Status: AC

## 2019-10-12 MED ORDER — PREDNISONE 10 MG (21) PO TBPK: ORAL_TABLET | ORAL | 0 refills | Status: DC

## 2019-10-12 NOTE — Progress Notes (Signed)
Virtual Visit via My Chart   This visit type was conducted due to national recommendations for restrictions regarding the COVID-19 Pandemic (e.g. social distancing) in an effort to limit this patient's exposure and mitigate transmission in our community.  Due to her co-morbid illnesses, this patient is at least at moderate risk for complications without adequate follow up.  This format is felt to be most appropriate for this patient at this time.  All issues noted in this document were discussed and addressed.  A limited physical exam was performed with this format.    This visit type was conducted due to national recommendations for restrictions regarding the COVID-19 Pandemic (e.g. social distancing) in an effort to limit this patient's exposure and mitigate transmission in our community.  Patients identity confirmed using two different identifiers.  This format is felt to be most appropriate for this patient at this time.  All issues noted in this document were discussed and addressed.  No physical exam was performed (except for noted visual exam findings with Video Visits).    Date:  10/27/2019   ID:  Marisa [name], DOB 1960/12/06, MRN [id number]  Patient Location:  Home - spoke with [name]  Provider location:   Office    Chief Complaint:  cough  History of Present Illness:    Marisa [name] is a 59 y.o. female who presents via video conferencing for a telehealth visit today.   The patient does not have symptoms concerning for COVID-19 infection (fever, chills, cough, or new shortness of breath).   Virtual visit due to persistent cough for the last 3 weeks, she went to the ER last week and was treated with one dose of decadron. She is concerned this is developing into an infection because she is still coughing.      Past Medical History:  Diagnosis Date  . Borderline diabetes   . Hypertension    Past Surgical History:  Procedure Laterality Date  .  ABDOMINAL HYSTERECTOMY    . CESAREAN SECTION    . SHOULDER SURGERY       Current Meds  Medication Sig  . albuterol (VENTOLIN HFA) 108 (90 Base) MCG/ACT inhaler Inhale 2 puffs into the lungs every 6 (six) hours as needed for wheezing or shortness of breath (and cough).  . Cholecalciferol (VITAMIN D3) 125 MCG (5000 UT) CAPS Take by mouth.   . linaclotide (LINZESS) 145 MCG CAPS capsule TK 1 C PO  BEFORE FIRST MEAL OF THE DAY.  Marland Kitchen Olmesartan-amLODIPine-HCTZ 40-10-25 MG TABS Take 1 tablet by mouth daily.  . pravastatin (PRAVACHOL) 40 MG tablet Take 1 tablet (40 mg total) by mouth every evening.  . Semaglutide,0.25 or 0.5MG /DOS, (OZEMPIC, 0.25 OR 0.5 MG/DOSE,) 2 MG/1.5ML SOPN Inject 0.5 mg into the skin once a week.     Allergies:   Patient has no known allergies.   Social History   Tobacco Use  . Smoking status: Never Smoker  . Smokeless tobacco: Never Used  Vaping Use  . Vaping Use: Never used  Substance Use Topics  . Alcohol use: No  . Drug use: No     Family Hx: The patient's family history includes Diabetes in her mother; Hypertension in her mother.  ROS:   Please see the history of present illness.    Review of Systems  Constitutional: Negative.   HENT: Negative.   Respiratory: Positive for cough and sputum production (clear).   Cardiovascular: Negative.   Psychiatric/Behavioral: Negative.     All  other systems reviewed and are negative.   Labs/Other Tests and Data Reviewed:    Recent Labs: 06/21/2019: ALT 11; BUN 12; Creatinine, Ser 0.77; Hemoglobin 13.5; Platelets 308; Potassium 3.7; Sodium 140   Recent Lipid Panel Lab Results  Component Value Date/Time   CHOL 178 06/21/2019 03:44 PM   TRIG 140 06/21/2019 03:44 PM   HDL 46 06/21/2019 03:44 PM   CHOLHDL 3.9 06/21/2019 03:44 PM   LDLCALC 107 (H) 06/21/2019 03:44 PM    Wt Readings from Last 3 Encounters:  10/07/19 230 lb (104.3 kg)  06/21/19 239 lb 6.4 oz (108.6 kg)  02/10/19 255 lb (115.7 kg)     Exam:      Vital Signs:  There were no vitals taken for this visit.    Physical Exam Constitutional:      General: She is not in acute distress.    Appearance: Normal appearance.  Pulmonary:     Effort: Pulmonary effort is normal. No respiratory distress.  Neurological:     General: No focal deficit present.     Mental Status: She is alert and oriented to person, place, and time.     Cranial Nerves: No cranial nerve deficit.  Psychiatric:        Mood and Affect: Mood normal.        Behavior: Behavior normal.        Thought Content: Thought content normal.        Judgment: Judgment normal.     ASSESSMENT & PLAN:     1. Persistent cough  Bronchitis vs upper respiratory infection  Will treat with antibiotic and steroid dose pack   She is requesting a referral to pulmonology for further evaluation since this occurs often  This may have a component of asthma but unsure at this time due to her history of reoccurring - azithromycin (ZITHROMAX) 250 MG tablet; Take 2 tablets (500 mg) on  Day 1,  followed by 1 tablet (250 mg) once daily on Days 2 through 5.  Dispense: 6 each; Refill: 0 - predniSONE (STERAPRED UNI-PAK 21 TAB) 10 MG (21) TBPK tablet; Take as directed  Dispense: 21 tablet; Refill: 0 - Ambulatory referral to Pulmonology    COVID-19 Education: The signs and symptoms of COVID-19 were discussed with the patient and how to seek care for testing (follow up with PCP or arrange E-visit).  The importance of social distancing was discussed today.  Patient Risk:   After full review of this patients clinical status, I feel that they are at least moderate risk at this time.  Time:   Today, I have spent 11 minutes/ seconds with the patient with telehealth technology discussing above diagnoses.     Medication Adjustments/Labs and Tests Ordered: Current medicines are reviewed at length with the patient today.  Concerns regarding medicines are outlined above.   Tests Ordered: Orders  Placed This Encounter  Procedures  . Ambulatory referral to Pulmonology    Medication Changes: Meds ordered this encounter  Medications  . azithromycin (ZITHROMAX) 250 MG tablet    Sig: Take 2 tablets (500 mg) on  Day 1,  followed by 1 tablet (250 mg) once daily on Days 2 through 5.    Dispense:  6 each    Refill:  0  . predniSONE (STERAPRED UNI-PAK 21 TAB) 10 MG (21) TBPK tablet    Sig: Take as directed    Dispense:  21 tablet    Refill:  0    Disposition:  Follow up  PRN  Signed, Arnette Felts, FNP

## 2019-10-12 NOTE — Telephone Encounter (Signed)
The pt consented to a virtual appt.

## 2019-10-12 NOTE — Progress Notes (Deleted)
  I,Prestin Munch [employer] as a Neurosurgeon for [employer], FNP.,have documented all relevant documentation on the behalf of Arnette Felts, FNP,as directed by  Arnette Felts, FNP while in the presence of Arnette Felts, FNP. This visit occurred during the SARS-CoV-2 public health emergency.  Safety protocols were in place, including screening questions prior to the visit, additional usage of staff PPE, and extensive cleaning of exam room while observing appropriate contact time as indicated for disinfecting solutions.  Subjective:     Patient ID: Marisa [name] , female    DOB: 04-09-1960 , 59 y.o.   MRN: [id number]   Chief Complaint  Patient presents with  . Referral    Patient stated she has been having cough alot of phelgm for the past 2 weeks    HPI  HPI   Past Medical History:  Diagnosis Date  . Borderline diabetes   . Hypertension      Family History  Problem Relation Age of Onset  . Diabetes Mother   . Hypertension Mother      Current Outpatient Medications:  .  albuterol (VENTOLIN HFA) 108 (90 Base) MCG/ACT inhaler, Inhale 2 puffs into the lungs every 6 (six) hours as needed for wheezing or shortness of breath (and cough)., Disp: 8 g, Rfl: 3 .  Cholecalciferol (VITAMIN D3) 125 MCG (5000 UT) CAPS, Take by mouth. , Disp: , Rfl:  .  linaclotide (LINZESS) 145 MCG CAPS capsule, TK 1 C PO  BEFORE FIRST MEAL OF THE DAY., Disp: 90 capsule, Rfl: 1 .  Olmesartan-amLODIPine-HCTZ 40-10-25 MG TABS, Take 1 tablet by mouth daily., Disp: 90 tablet, Rfl: 1 .  pravastatin (PRAVACHOL) 40 MG tablet, Take 1 tablet (40 mg total) by mouth every evening., Disp: 30 tablet, Rfl: 11 .  Semaglutide,0.25 or 0.5MG /DOS, (OZEMPIC, 0.25 OR 0.5 MG/DOSE,) 2 MG/1.5ML SOPN, Inject 0.5 mg into the skin once a week., Disp: 3 pen, Rfl: 1 .  albuterol (PROVENTIL) (2.5 MG/3ML) 0.083% nebulizer solution, Take 3 mLs (2.5 mg total) by nebulization every 4 (four) hours as needed for wheezing or shortness of  breath (for neb machine ordered today.). (Patient not taking: Reported on 10/12/2019), Disp: 75 mL, Rfl: 1 .  fluticasone (FLOVENT DISKUS) 50 MCG/BLIST diskus inhaler, Inhale into the lungs. (Patient not taking: Reported on 10/12/2019), Disp: , Rfl:  .  guaiFENesin-codeine 100-10 MG/5ML syrup, 1-2 tsp q hs prn cough (Patient not taking: Reported on 02/10/2019), Disp: 120 mL, Rfl: 0 .  phentermine (ADIPEX-P) 37.5 MG tablet, Take by mouth. (Patient not taking: Reported on 10/12/2019), Disp: , Rfl:    No Known Allergies   Review of Systems   There were no vitals filed for this visit. There is no height or weight on file to calculate BMI.   Objective:  Physical Exam      Assessment And Plan:     There are no diagnoses linked to this encounter.    Patient was given opportunity to ask questions. Patient verbalized understanding of the plan and was able to repeat key elements of the plan. All questions were answered to their satisfaction.  [address] [place], CMA   I, [place], [place], have reviewed all documentation for this visit. The documentation on 10/12/19 for the exam, diagnosis, procedures, and orders are all accurate and complete.  THE PATIENT IS ENCOURAGED TO PRACTICE SOCIAL DISTANCING DUE TO THE COVID-19 PANDEMIC.

## 2019-10-27 ENCOUNTER — Encounter: Payer: Self-pay | Admitting: Nurse Practitioner

## 2019-11-01 ENCOUNTER — Other Ambulatory Visit: Payer: Self-pay

## 2019-11-01 ENCOUNTER — Encounter: Payer: Self-pay | Admitting: Internal Medicine

## 2019-11-01 ENCOUNTER — Ambulatory Visit (INDEPENDENT_AMBULATORY_CARE_PROVIDER_SITE_OTHER): Admitting: Internal Medicine

## 2019-11-01 VITALS — BP 136/82 | HR 94 | Temp 98.2°F | Ht 68.0 in | Wt 240.8 lb

## 2019-11-01 DIAGNOSIS — E559 Vitamin D deficiency, unspecified: Secondary | ICD-10-CM

## 2019-11-01 DIAGNOSIS — Z23 Encounter for immunization: Secondary | ICD-10-CM

## 2019-11-01 DIAGNOSIS — E1165 Type 2 diabetes mellitus with hyperglycemia: Secondary | ICD-10-CM | POA: Diagnosis not present

## 2019-11-01 DIAGNOSIS — I10 Essential (primary) hypertension: Secondary | ICD-10-CM | POA: Diagnosis not present

## 2019-11-01 DIAGNOSIS — Z6836 Body mass index (BMI) 36.0-36.9, adult: Secondary | ICD-10-CM

## 2019-11-01 MED ORDER — OZEMPIC (1 MG/DOSE) 2 MG/1.5ML ~~LOC~~ SOPN: 1.0000 mg | PEN_INJECTOR | SUBCUTANEOUS | 1 refills | Status: DC

## 2019-11-01 NOTE — Progress Notes (Signed)
I,Katawbba Wiggins,acting as a Education administrator for Maximino Greenland, MD.,have documented all relevant documentation on the behalf of Maximino Greenland, MD,as directed by  Maximino Greenland, MD while in the presence of Maximino Greenland, MD.  This visit occurred during the SARS-CoV-2 public health emergency.  Safety protocols were in place, including screening questions prior to the visit, additional usage of staff PPE, and extensive cleaning of exam room while observing appropriate contact time as indicated for disinfecting solutions.  Subjective:     Patient ID: Marisa [name] , female    DOB: [birth year] , 59 y.o.   MRN: [id number]   Chief Complaint  Patient presents with  . Diabetes  . Hypertension    HPI  59 She is here today for diabetes and BP f/u.  She reports compliance with meds. NO longer on phentermine, states it ran her BP up. Therefore, she is no longer going to [facility].   Diabetes She presents for her follow-up diabetic visit. She has type 2 diabetes mellitus. Her disease course has been stable. There are no hypoglycemic associated symptoms. Pertinent negatives for diabetes include no blurred vision. There are no hypoglycemic complications. Risk factors for coronary artery disease include diabetes mellitus, hypertension, sedentary lifestyle, post-menopausal, dyslipidemia and obesity. She is following a diabetic diet. She does not see a podiatrist. Hypertension This is a chronic problem. The current episode started more than 1 year ago. The problem has been gradually improving since onset. The problem is uncontrolled. Pertinent negatives include no blurred vision. Risk factors for coronary artery disease include diabetes mellitus, dyslipidemia, obesity, post-menopausal state and sedentary lifestyle. The current treatment provides moderate improvement. Compliance problems include exercise.      Past Medical History:  Diagnosis Date  . Borderline diabetes   . Hypertension       Family History  Problem Relation Age of Onset  . Diabetes Mother   . Hypertension Mother      Current Outpatient Medications:  .  albuterol (PROVENTIL) (2.5 MG/3ML) 0.083% nebulizer solution, Take 3 mLs (2.5 mg total) by nebulization every 4 (four) hours as needed for wheezing or shortness of breath (for neb machine ordered today.)., Disp: 75 mL, Rfl: 1 .  albuterol (VENTOLIN HFA) 108 (90 Base) MCG/ACT inhaler, Inhale 2 puffs into the lungs every 6 (six) hours as needed for wheezing or shortness of breath (and cough)., Disp: 8 g, Rfl: 3 .  Cholecalciferol (VITAMIN D3) 125 MCG (5000 UT) CAPS, Take by mouth. 3,000 units, Disp: , Rfl:  .  DM-APAP-CPM (CORICIDIN HBP FLU PO), Take by mouth., Disp: , Rfl:  .  fluticasone (FLOVENT DISKUS) 50 MCG/BLIST diskus inhaler, Inhale into the lungs. , Disp: , Rfl:  .  linaclotide (LINZESS) 145 MCG CAPS capsule, TK 1 C PO  BEFORE FIRST MEAL OF THE DAY., Disp: 90 capsule, Rfl: 1 .  Olmesartan-amLODIPine-HCTZ 40-10-25 MG TABS, Take 1 tablet by mouth daily., Disp: 90 tablet, Rfl: 1 .  pravastatin (PRAVACHOL) 40 MG tablet, Take 1 tablet (40 mg total) by mouth every evening., Disp: 30 tablet, Rfl: 11 .  guaiFENesin-codeine 100-10 MG/5ML syrup, 1-2 tsp q hs prn cough (Patient not taking: Reported on 11/01/2019), Disp: 120 mL, Rfl: 0 .  Semaglutide, 1 MG/DOSE, (OZEMPIC, 1 MG/DOSE,) 2 MG/1.5ML SOPN, Inject 1 mg into the skin once a week., Disp: 4.5 mL, Rfl: 1   No Known Allergies   Review of Systems  Constitutional: Negative.   Eyes: Negative for blurred vision.  Respiratory: Negative.  Cardiovascular: Negative.   Gastrointestinal: Negative.   Psychiatric/Behavioral: Negative.   All other systems reviewed and are negative.    Today's Vitals   11/01/19 1424  BP: 136/82  Pulse: 94  Temp: 98.2 F (36.8 C)  TempSrc: Oral  SpO2: 98%  Weight: 240 lb 12.8 oz (109.2 kg)  Height: '5\' 8"'  (1.727 m)   Body mass index is 36.61 kg/m.  Wt Readings from Last  3 Encounters:  11/01/19 240 lb 12.8 oz (109.2 kg)  10/07/19 230 lb (104.3 kg)  06/21/19 239 lb 6.4 oz (108.6 kg)   Objective:  Physical Exam Vitals and nursing note reviewed.  Constitutional:      Appearance: Normal appearance. She is obese.  HENT:     Head: Normocephalic and atraumatic.  Cardiovascular:     Rate and Rhythm: Normal rate and regular rhythm.     Heart sounds: Normal heart sounds.  Pulmonary:     Breath sounds: Normal breath sounds.  Skin:    General: Skin is warm.  Neurological:     General: No focal deficit present.     Mental Status: She is alert and oriented to person, place, and time.         Assessment And Plan:     1. Uncontrolled type 2 diabetes mellitus with hyperglycemia (HCC) Comments: Chronic. I will increase the dose of Ozempic to 66m once weekly. She is in agreement with treatment plan.  - Hemoglobin A1c - CMP14+EGFR - Semaglutide, 1 MG/DOSE, (OZEMPIC, 1 MG/DOSE,) 2 MG/1.5ML SOPN; Inject 1 mg into the skin once a week.  Dispense: 4.5 mL; Refill: 1  2. Essential hypertension, benign Comments: Chronic, fair control. She will continue with current meds. She is encouraged to avoid adding salt to her foods.   3. Vitamin D deficiency disease Comments: I will check vitamin D level and supplement as needed.  - Vitamin D (25 hydroxy)  4. Class 2 severe obesity due to excess calories with serious comorbidity and body mass index (BMI) of 36.0 to 36.9 in adult ([facility] Comments: [facility] We will see how she tolerates higher dose of Ozempic. Encouraged to aim for at least 150 min of exercise/week  5. Immunization due Comments: She was given flu vaccine to update her immunization history.      Patient was given opportunity to ask questions. Patient verbalized understanding of the plan and was able to repeat key elements of the plan. All questions were answered to their satisfaction.  RMaximino Greenland MD   I,  RMaximino Greenland MD, have reviewed all documentation for this visit. The documentation on 11/01/19 for the exam, diagnosis, procedures, and orders are all accurate and complete.  THE PATIENT IS ENCOURAGED TO PRACTICE SOCIAL DISTANCING DUE TO THE COVID-19 PANDEMIC.

## 2019-11-01 NOTE — Patient Instructions (Addendum)
Increase Ozempic to 0.5mg  AND 0.25mg  once weekly   Diabetes Mellitus and Foot Care Foot care is an important part of your health, especially when you have diabetes. Diabetes may cause you to have problems because of poor blood flow (circulation) to your feet and legs, which can cause your skin to:  Become thinner and drier.  Break more easily.  Heal more slowly.  Peel and crack. You may also have nerve damage (neuropathy) in your legs and feet, causing decreased feeling in them. This means that you may not notice minor injuries to your feet that could lead to more serious problems. Noticing and addressing any potential problems early is the best way to prevent future foot problems. How to care for your feet Foot hygiene  Wash your feet daily with warm water and mild soap. Do not use hot water. Then, pat your feet and the areas between your toes until they are completely dry. Do not soak your feet as this can dry your skin.  Trim your toenails straight across. Do not dig under them or around the cuticle. File the edges of your nails with an emery board or nail file.  Apply a moisturizing lotion or petroleum jelly to the skin on your feet and to dry, brittle toenails. Use lotion that does not contain alcohol and is unscented. Do not apply lotion between your toes. Shoes and socks  Wear clean socks or stockings every day. Make sure they are not too tight. Do not wear knee-high stockings since they may decrease blood flow to your legs.  Wear shoes that fit properly and have enough cushioning. Always look in your shoes before you put them on to be sure there are no objects inside.  To break in new shoes, wear them for just a few hours a day. This prevents injuries on your feet. Wounds, scrapes, corns, and calluses  Check your feet daily for blisters, cuts, bruises, sores, and redness. If you cannot see the bottom of your feet, use a mirror or ask someone for help.  Do not cut corns or  calluses or try to remove them with medicine.  If you find a minor scrape, cut, or break in the skin on your feet, keep it and the skin around it clean and dry. You may clean these areas with mild soap and water. Do not clean the area with peroxide, alcohol, or iodine.  If you have a wound, scrape, corn, or callus on your foot, look at it several times a day to make sure it is healing and not infected. Check for: ? Redness, swelling, or pain. ? Fluid or blood. ? Warmth. ? Pus or a bad smell. General instructions  Do not cross your legs. This may decrease blood flow to your feet.  Do not use heating pads or hot water bottles on your feet. They may burn your skin. If you have lost feeling in your feet or legs, you may not know this is happening until it is too late.  Protect your feet from hot and cold by wearing shoes, such as at the beach or on hot pavement.  Schedule a complete foot exam at least once a year (annually) or more often if you have foot problems. If you have foot problems, report any cuts, sores, or bruises to your health care provider immediately. Contact a health care provider if:  You have a medical condition that increases your risk of infection and you have any cuts, sores, or bruises on  your feet.  You have an injury that is not healing.  You have redness on your legs or feet.  You feel burning or tingling in your legs or feet.  You have pain or cramps in your legs and feet.  Your legs or feet are numb.  Your feet always feel cold.  You have pain around a toenail. Get help right away if:  You have a wound, scrape, corn, or callus on your foot and: ? You have pain, swelling, or redness that gets worse. ? You have fluid or blood coming from the wound, scrape, corn, or callus. ? Your wound, scrape, corn, or callus feels warm to the touch. ? You have pus or a bad smell coming from the wound, scrape, corn, or callus. ? You have a fever. ? You have a red line  going up your leg. Summary  Check your feet every day for cuts, sores, red spots, swelling, and blisters.  Moisturize feet and legs daily.  Wear shoes that fit properly and have enough cushioning.  If you have foot problems, report any cuts, sores, or bruises to your health care provider immediately.  Schedule a complete foot exam at least once a year (annually) or more often if you have foot problems. This information is not intended to replace advice given to you by your health care provider. Make sure you discuss any questions you have with your health care provider. Document Revised: 10/07/2018 Document Reviewed: 02/16/2016 Elsevier Patient Education  [place].

## 2019-11-02 LAB — CMP14+EGFR
ALT: 12 IU/L (ref 0–32)
AST: 16 IU/L (ref 0–40)
Albumin/Globulin Ratio: 1.6 (ref 1.2–2.2)
Albumin: 4.3 g/dL (ref 3.8–4.9)
Alkaline Phosphatase: 111 IU/L (ref 44–121)
BUN/Creatinine Ratio: 14 (ref 9–23)
BUN: 11 mg/dL (ref 6–24)
Bilirubin Total: 0.3 mg/dL (ref 0.0–1.2)
CO2: 23 mmol/L (ref 20–29)
Calcium: 9.1 mg/dL (ref 8.7–10.2)
Chloride: 102 mmol/L (ref 96–106)
Creatinine, Ser: 0.76 mg/dL (ref 0.57–1.00)
GFR calc Af Amer: 99 mL/min/{1.73_m2} (ref >59)
GFR calc non Af Amer: 86 mL/min/{1.73_m2} (ref >59)
Globulin, Total: 2.7 g/dL (ref 1.5–4.5)
Glucose: 110 mg/dL — ABNORMAL HIGH (ref 65–99)
Potassium: 3.4 mmol/L — ABNORMAL LOW (ref 3.5–5.2)
Sodium: 143 mmol/L (ref 134–144)
Total Protein: 7 g/dL (ref 6.0–8.5)

## 2019-11-02 LAB — HEMOGLOBIN A1C
Est. average glucose Bld gHb Est-mCnc: 146 mg/dL
Hgb A1c MFr Bld: 6.7 % — ABNORMAL HIGH (ref 4.8–5.6)

## 2019-11-02 LAB — VITAMIN D 25 HYDROXY (VIT D DEFICIENCY, FRACTURES): Vit D, 25-Hydroxy: 30.2 ng/mL (ref 30.0–100.0)

## 2019-11-03 NOTE — Progress Notes (Signed)
Pt stated she was put on chol medication at her last visit and thought you would check to make sure medication is working

## 2020-02-14 ENCOUNTER — Encounter: Admitting: Internal Medicine

## 2020-02-18 ENCOUNTER — Encounter: Admitting: Internal Medicine

## 2020-03-02 ENCOUNTER — Encounter: Admitting: Internal Medicine

## 2020-03-29 ENCOUNTER — Telehealth: Payer: Self-pay

## 2020-03-29 NOTE — Telephone Encounter (Signed)
The pt left a message that she didn't know that Pulmonology had been trying to contact her and that she just got the message yesterday and that she needed to have the referral placed again.  I left the pt a message that the referral specialist placed the referral back to pulmonology.

## 2020-04-05 LAB — HM MAMMOGRAPHY

## 2020-04-17 ENCOUNTER — Encounter: Admitting: Internal Medicine

## 2020-04-18 ENCOUNTER — Institutional Professional Consult (permissible substitution): Admitting: Pulmonary Disease

## 2020-04-27 ENCOUNTER — Ambulatory Visit (INDEPENDENT_AMBULATORY_CARE_PROVIDER_SITE_OTHER): Admitting: Nurse Practitioner

## 2020-04-27 ENCOUNTER — Other Ambulatory Visit: Payer: Self-pay

## 2020-04-27 ENCOUNTER — Other Ambulatory Visit (HOSPITAL_COMMUNITY)
Admission: RE | Admit: 2020-04-27 | Discharge: 2020-04-27 | Disposition: A | Discharge status: Home / Self Care | Source: Ambulatory Visit | Attending: Nurse Practitioner | Admitting: Nurse Practitioner

## 2020-04-27 VITALS — BP 150/98 | HR 87 | Temp 98.3°F | Ht 62.8 in | Wt 253.6 lb

## 2020-04-27 DIAGNOSIS — E66813 Obesity, class 3: Secondary | ICD-10-CM

## 2020-04-27 DIAGNOSIS — Z6841 Body Mass Index (BMI) 40.0 and over, adult: Secondary | ICD-10-CM

## 2020-04-27 DIAGNOSIS — E7800 Pure hypercholesterolemia, unspecified: Secondary | ICD-10-CM | POA: Diagnosis not present

## 2020-04-27 DIAGNOSIS — I10 Essential (primary) hypertension: Secondary | ICD-10-CM

## 2020-04-27 DIAGNOSIS — E1165 Type 2 diabetes mellitus with hyperglycemia: Secondary | ICD-10-CM

## 2020-04-27 DIAGNOSIS — Z01419 Encounter for gynecological examination (general) (routine) without abnormal findings: Secondary | ICD-10-CM

## 2020-04-27 DIAGNOSIS — Z0000 Encounter for general adult medical examination without abnormal findings: Secondary | ICD-10-CM

## 2020-04-27 DIAGNOSIS — E559 Vitamin D deficiency, unspecified: Secondary | ICD-10-CM | POA: Diagnosis not present

## 2020-04-27 DIAGNOSIS — J0100 Acute maxillary sinusitis, unspecified: Secondary | ICD-10-CM

## 2020-04-27 MED ORDER — AMOXICILLIN-POT CLAVULANATE 875-125 MG PO TABS: 1.0000 | ORAL_TABLET | Freq: Two times a day (BID) | ORAL | 0 refills | Status: AC

## 2020-04-27 NOTE — Progress Notes (Signed)
I,Tianna Badgett,acting as a Education administrator for [employer], NP.,have documented all relevant documentation on the behalf of [employer], NP,as directed by  Bary Castilla, NP while in the presence of Bary Castilla, NP.  This visit occurred during the SARS-CoV-2 public health emergency.  Safety protocols were in place, including screening questions prior to the visit, additional usage of staff PPE, and extensive cleaning of exam room while observing appropriate contact time as indicated for disinfecting solutions.  Subjective:     Patient ID: Marisa [name] , female    DOB: 08/22/1960 , 60 y.o.   MRN: [id number]   Chief Complaint  Patient presents with  . Annual Exam    HPI  She is here today for a full physical examination. She is no longer followed by GYN. She has had a hysterectomy. Today the patient does not really feel good as she was having some congestion and cough.  No fever. Sinus pressure. She had some slight earache. No HA.  The coughing started Tuesday. Her BP was 150/98 she did not take her BP medicine today. But when she takes it at home everyday. She is taking ozempic.  Diet: she doesn't eat all day. She is eating healthier. She is eating salad, chicken and salmon. She is also drinking cucumbers.  Exercise: She used to exercise by walking but she hasn't done it in a while.  She stays home and watches her grandchild.  BP Readings from Last 3 Encounters: 04/27/20 : (!) 150/98 11/01/19 : 136/82 10/07/19 : (!) 173/99   Diabetes She presents for her follow-up diabetic visit. She has type 2 diabetes mellitus. Her disease course has been stable. There are no hypoglycemic associated symptoms. Pertinent negatives for hypoglycemia include no confusion, dizziness or headaches. Pertinent negatives for diabetes include no chest pain, no polydipsia, no polyphagia and no weakness. There are no hypoglycemic complications. Risk factors for coronary artery disease include  diabetes mellitus, hypertension, sedentary lifestyle, post-menopausal, dyslipidemia and obesity. She does not see a podiatrist. Hypertension This is a chronic problem. The current episode started more than 1 year ago. The problem has been gradually improving since onset. The problem is uncontrolled. Pertinent negatives include no chest pain, headaches, palpitations or shortness of breath. Compliance problems include exercise.      Past Medical History:  Diagnosis Date  . Borderline diabetes   . Hypertension      Family History  Problem Relation Age of Onset  . Diabetes Mother   . Hypertension Mother      Current Outpatient Medications:  .  albuterol (PROVENTIL) (2.5 MG/3ML) 0.083% nebulizer solution, Take 3 mLs (2.5 mg total) by nebulization every 4 (four) hours as needed for wheezing or shortness of breath (for neb machine ordered today.)., Disp: 75 mL, Rfl: 1 .  albuterol (VENTOLIN HFA) 108 (90 Base) MCG/ACT inhaler, Inhale 2 puffs into the lungs every 6 (six) hours as needed for wheezing or shortness of breath (and cough)., Disp: 8 g, Rfl: 3 .  amoxicillin-clavulanate (AUGMENTIN) 875-125 MG tablet, Take 1 tablet by mouth 2 (two) times daily for 7 days., Disp: 14 tablet, Rfl: 0 .  Cholecalciferol (VITAMIN D3) 125 MCG (5000 UT) CAPS, Take by mouth. 3,000 units, Disp: , Rfl:  .  DM-APAP-CPM (CORICIDIN HBP FLU PO), Take by mouth., Disp: , Rfl:  .  fluticasone (FLOVENT DISKUS) 50 MCG/BLIST diskus inhaler, Inhale into the lungs. , Disp: , Rfl:  .  linaclotide (LINZESS) 145 MCG CAPS capsule, TK 1 C PO  BEFORE  FIRST MEAL OF THE DAY., Disp: 90 capsule, Rfl: 1 .  Olmesartan-amLODIPine-HCTZ 40-10-25 MG TABS, Take 1 tablet by mouth daily., Disp: 90 tablet, Rfl: 1 .  pravastatin (PRAVACHOL) 40 MG tablet, Take 1 tablet (40 mg total) by mouth every evening., Disp: 30 tablet, Rfl: 11 .  Semaglutide, 1 MG/DOSE, (OZEMPIC, 1 MG/DOSE,) 2 MG/1.5ML SOPN, Inject 1 mg into the skin once a week., Disp: 4.5 mL,  Rfl: 1   No Known Allergies    The patient states she uses  for birth control. Last LMP was No LMP recorded. Patient has had a hysterectomy..Negative for: breast discharge, breast lump(s), breast pain and breast self exam. Associated symptoms include abnormal vaginal bleeding. Pertinent negatives include abnormal bleeding (hematology), anxiety, decreased libido, depression, difficulty falling sleep, dyspareunia, history of infertility, nocturia, sexual dysfunction, sleep disturbances, urinary incontinence, urinary urgency, vaginal discharge and vaginal itching. Diet regular.The patient states her exercise level is    . The patient's tobacco use is:  Social History   Tobacco Use  Smoking Status Never Smoker  Smokeless Tobacco Never Used  . She has been exposed to passive smoke. The patient's alcohol use is:  Social History   Substance and Sexual Activity  Alcohol Use No  . Additional information: Last pap 04/27/20, next one scheduled for 2025.    Review of Systems  Constitutional: Negative.  Negative for fever.  HENT: Positive for sinus pressure and sinus pain. Negative for ear pain and sore throat.   Eyes: Negative.  Negative for pain.  Respiratory: Positive for cough and wheezing. Negative for choking and shortness of breath.   Cardiovascular: Negative.  Negative for chest pain and palpitations.  Gastrointestinal: Negative.  Negative for abdominal pain, constipation, diarrhea, nausea and vomiting.  Endocrine: Negative.  Negative for polydipsia and polyphagia.  Genitourinary: Negative.   Musculoskeletal: Negative.  Negative for arthralgias and myalgias.  Skin: Negative.   Allergic/Immunologic: Negative.   Neurological: Negative.  Negative for dizziness, weakness and headaches.  Hematological: Negative.   Psychiatric/Behavioral: Negative.  Negative for confusion.     Today's Vitals   04/27/20 1434  BP: (!) 150/98  Pulse: 87  Temp: 98.3 F (36.8 C)  TempSrc: Oral  Weight: 253  lb 9.6 oz (115 kg)  Height: 5' 2.8" (1.595 m)   Body mass index is 45.21 kg/m.  Wt Readings from Last 3 Encounters:  04/27/20 253 lb 9.6 oz (115 kg)  11/01/19 240 lb 12.8 oz (109.2 kg)  10/07/19 230 lb (104.3 kg)    Objective:  Physical Exam Vitals and nursing note reviewed. Exam conducted with a chaperone present.  Constitutional:      Appearance: Normal appearance. She is obese.  HENT:     Head: Normocephalic and atraumatic.     Right Ear: Tympanic membrane, ear canal and external ear normal.     Left Ear: Tympanic membrane, ear canal and external ear normal.     Nose: Nose normal.     Mouth/Throat:     Mouth: Mucous membranes are moist.     Pharynx: Oropharynx is clear.  Eyes:     Extraocular Movements: Extraocular movements intact.     Conjunctiva/sclera: Conjunctivae normal.     Pupils: Pupils are equal, round, and reactive to light.  Cardiovascular:     Rate and Rhythm: Normal rate and regular rhythm.     Pulses: Normal pulses.     Heart sounds: Normal heart sounds. No murmur heard.   Pulmonary:     Effort: Pulmonary effort  is normal. No respiratory distress.     Breath sounds: Normal breath sounds. No wheezing.  Chest:  Breasts:     Tanner Score is 5.     Right: Normal.     Left: Normal.    Abdominal:     General: Abdomen is flat. Bowel sounds are normal.     Palpations: Abdomen is soft.  Genitourinary:    General: Normal vulva.     Rectum: Normal.     Comments: Pap smear sent  Musculoskeletal:        General: Normal range of motion.     Cervical back: Normal range of motion and neck supple.     Right lower leg: 2+ Edema present.     Left lower leg: 2+ Edema present.     Right foot: Normal range of motion.     Left foot: Normal range of motion.  Feet:     Right foot:     Protective Sensation: 4 sites tested. 4 sites sensed.     Skin integrity: Skin integrity normal.     Left foot:     Protective Sensation: 5 sites tested. 4 sites sensed.     Skin  integrity: Skin integrity normal.  Skin:    General: Skin is warm and dry.     Capillary Refill: Capillary refill takes less than 2 seconds.  Neurological:     General: No focal deficit present.     Mental Status: She is alert and oriented to person, place, and time.  Psychiatric:        Mood and Affect: Mood normal.        Behavior: Behavior normal.         Assessment And Plan:     1. Routine general medical examination at health care facility  A full exam was performed.  Importance of monthly self breast exams was discussed with the patient.   -Patient was advised on getting 30-45 min. Of exercise daily  -Patient advised on eating a healthy diet consisting of decrease intake of sodium. Increase intake of fruits and vegetables.  - CBC - CMP14+EGFR - HIV Antibody (routine testing w rflx)  2. Uncontrolled type 2 diabetes mellitus with hyperglycemia ([facility]) - She is taking ozempic once weekly and tolerating well.  -Importance of medication compliance was discussed with the patient.  - Hemoglobin A1c  3. Essential hypertension, benign -Chronic, Fair control  -Patient did not take her BP med today  -Advised patient on a sodium free diet -EKG completed 6 months ago.  - POCT Urinalysis Dipstick (81002) - POCT UA - Microalbumin  4. Vitamin D deficiency disease -Will assess and supplement last needed.   5. Pure hypercholesterolemia -Chronic, stable  -Continue current meds  -Will check labs and assess  - Lipid panel  6. Acute maxillary sinusitis, recurrence not specified -Educated patient to try OTC Flonase  -Will send prescription for antx  - amoxicillin-clavulanate (AUGMENTIN) 875-125 MG tablet; Take 1 tablet by mouth 2 (two) times daily for 7 days.  Dispense: 14 tablet; Refill: 0  7. Encounter for cervical Pap smear with pelvic exam - Cytology -Pap Smear  8. Class 3 severe obesity due to excess calories with serious comorbidity and body mass index (BMI) of 45.0 to 49.9  in adult [facility]) Educated patient to get 30-45 min off exercise each day and focus on a healthy diet by maintaining BMI less than 30 for good cardiovascular health.   Staying healthy and adopting a healthy lifestyle for  your overall health is important. You should eat 7 or more servings of fruits and vegetables per day. You should drink plenty of water to keep yourself hydrated and your kidneys healthy. This includes about 65-80+ fluid ounces of water. Limit your intake of animal fats especially for elevated cholesterol. Avoid highly processed food and limit your salt intake if you have hypertension. Avoid foods high in saturated/Trans fats. Along with a healthy diet it is also very important to maintain time for yourself to maintain a healthy mental health with low stress levels. You should get atleast 150 min of moderate intensity exercise weekly for a healthy heart. Along with eating right and exercising, aim for at least 7-9 hours of sleep daily.  Eat more whole grains which includes barley, wheat berries, oats, brown rice and whole wheat pasta. Use healthy plant oils which include olive, soy, corn, sunflower and peanut. Limit your caffeine and sugary drinks. Limit your intake of fast foods. Limit milk and dairy products to one or two daily servings.   Patient was given opportunity to ask questions. Patient verbalized understanding of the plan and was able to repeat key elements of the plan. All questions were answered to their satisfaction.  Bary Castilla, NP   I, Bary Castilla, NP  have reviewed all documentation for this visit. The documentation on 04/27/20 for the exam, diagnosis, procedures, and orders are all accurate and complete.    THE PATIENT IS ENCOURAGED TO PRACTICE SOCIAL DISTANCING DUE TO THE COVID-19 PANDEMIC.

## 2020-04-27 NOTE — Patient Instructions (Signed)
Health Maintenance, Female Adopting a healthy lifestyle and getting preventive care are important in promoting health and wellness. Ask your health care provider about:  The right schedule for you to have regular tests and exams.  Things you can do on your own to prevent diseases and keep yourself healthy. What should I know about diet, weight, and exercise? Eat a healthy diet  Eat a diet that includes plenty of vegetables, fruits, low-fat dairy products, and lean protein.  Do not eat a lot of foods that are high in solid fats, added sugars, or sodium.   Maintain a healthy weight Body mass index (BMI) is used to identify weight problems. It estimates body fat based on height and weight. Your health care provider can help determine your BMI and help you achieve or maintain a healthy weight. Get regular exercise Get regular exercise. This is one of the most important things you can do for your health. Most adults should:  Exercise for at least 150 minutes each week. The exercise should increase your heart rate and make you sweat (moderate-intensity exercise).  Do strengthening exercises at least twice a week. This is in addition to the moderate-intensity exercise.  Spend less time sitting. Even light physical activity can be beneficial. Watch cholesterol and blood lipids Have your blood tested for lipids and cholesterol at 61 years of age, then have this test every 5 years. Have your cholesterol levels checked more often if:  Your lipid or cholesterol levels are high.  You are older than 61 years of age.  You are at high risk for heart disease. What should I know about cancer screening? Depending on your health history and family history, you may need to have cancer screening at various ages. This may include screening for:  Breast cancer.  Cervical cancer.  Colorectal cancer.  Skin cancer.  Lung cancer. What should I know about heart disease, diabetes, and high blood  pressure? Blood pressure and heart disease  High blood pressure causes heart disease and increases the risk of stroke. This is more likely to develop in people who have high blood pressure readings, are of African descent, or are overweight.  Have your blood pressure checked: ? Every 3-5 years if you are 18-39 years of age. ? Every year if you are 40 years old or older. Diabetes Have regular diabetes screenings. This checks your fasting blood sugar level. Have the screening done:  Once every three years after age 40 if you are at a normal weight and have a low risk for diabetes.  More often and at a younger age if you are overweight or have a high risk for diabetes. What should I know about preventing infection? Hepatitis B If you have a higher risk for hepatitis B, you should be screened for this virus. Talk with your health care provider to find out if you are at risk for hepatitis B infection. Hepatitis C Testing is recommended for:  Everyone born from 1945 through 1965.  Anyone with known risk factors for hepatitis C. Sexually transmitted infections (STIs)  Get screened for STIs, including gonorrhea and chlamydia, if: ? You are sexually active and are younger than 61 years of age. ? You are older than 61 years of age and your health care provider tells you that you are at risk for this type of infection. ? Your sexual activity has changed since you were last screened, and you are at increased risk for chlamydia or gonorrhea. Ask your health care provider   if you are at risk.  Ask your health care provider about whether you are at high risk for HIV. Your health care provider may recommend a prescription medicine to help prevent HIV infection. If you choose to take medicine to prevent HIV, you should first get tested for HIV. You should then be tested every 3 months for as long as you are taking the medicine. Pregnancy  If you are about to stop having your period (premenopausal) and  you may become pregnant, seek counseling before you get pregnant.  Take 400 to 800 micrograms (mcg) of folic acid every day if you become pregnant.  Ask for birth control (contraception) if you want to prevent pregnancy. Osteoporosis and menopause Osteoporosis is a disease in which the bones lose minerals and strength with aging. This can result in bone fractures. If you are 65 years old or older, or if you are at risk for osteoporosis and fractures, ask your health care provider if you should:  Be screened for bone loss.  Take a calcium or vitamin D supplement to lower your risk of fractures.  Be given hormone replacement therapy (HRT) to treat symptoms of menopause. Follow these instructions at home: Lifestyle  Do not use any products that contain nicotine or tobacco, such as cigarettes, e-cigarettes, and chewing tobacco. If you need help quitting, ask your health care provider.  Do not use street drugs.  Do not share needles.  Ask your health care provider for help if you need support or information about quitting drugs. Alcohol use  Do not drink alcohol if: ? Your health care provider tells you not to drink. ? You are pregnant, may be pregnant, or are planning to become pregnant.  If you drink alcohol: ? Limit how much you use to 0-1 drink a day. ? Limit intake if you are breastfeeding.  Be aware of how much alcohol is in your drink. In the [place] one drink equals one 12 oz bottle of beer (355 mL), one 5 oz glass of wine (148 mL), or one 1 oz glass of hard liquor (44 mL). General instructions  Schedule regular health, dental, and eye exams.  Stay current with your vaccines.  Tell your health care provider if: ? You often feel depressed. ? You have ever been abused or do not feel safe at home. Summary  Adopting a healthy lifestyle and getting preventive care are important in promoting health and wellness.  Follow your health care provider's instructions about healthy  diet, exercising, and getting tested or screened for diseases.  Follow your health care provider's instructions on monitoring your cholesterol and blood pressure. This information is not intended to replace advice given to you by your health care provider. Make sure you discuss any questions you have with your health care provider. Document Revised: 01/07/2018 Document Reviewed: 01/07/2018 Elsevier Patient Education  2021 Elsevier Inc.  

## 2020-04-28 LAB — CMP14+EGFR
ALT: 16 IU/L (ref 0–32)
AST: 16 IU/L (ref 0–40)
Albumin/Globulin Ratio: 1.2 (ref 1.2–2.2)
Albumin: 4.2 g/dL (ref 3.8–4.9)
Alkaline Phosphatase: 109 IU/L (ref 44–121)
BUN/Creatinine Ratio: 8 — ABNORMAL LOW (ref 12–28)
BUN: 8 mg/dL (ref 8–27)
Bilirubin Total: 0.4 mg/dL (ref 0.0–1.2)
CO2: 24 mmol/L (ref 20–29)
Calcium: 9.3 mg/dL (ref 8.7–10.3)
Chloride: 99 mmol/L (ref 96–106)
Creatinine, Ser: 0.95 mg/dL (ref 0.57–1.00)
Globulin, Total: 3.4 g/dL (ref 1.5–4.5)
Glucose: 102 mg/dL — ABNORMAL HIGH (ref 65–99)
Potassium: 3.7 mmol/L (ref 3.5–5.2)
Sodium: 141 mmol/L (ref 134–144)
Total Protein: 7.6 g/dL (ref 6.0–8.5)
eGFR: 69 mL/min/{1.73_m2} (ref >59)

## 2020-04-28 LAB — CBC
Hematocrit: 42.5 % (ref 34.0–46.6)
Hemoglobin: 13.2 g/dL (ref 11.1–15.9)
MCH: 21.7 pg — ABNORMAL LOW (ref 26.6–33.0)
MCHC: 31.1 g/dL — ABNORMAL LOW (ref 31.5–35.7)
MCV: 70 fL — ABNORMAL LOW (ref 79–97)
Platelets: 300 10*3/uL (ref 150–450)
RBC: 6.07 x10E6/uL — ABNORMAL HIGH (ref 3.77–5.28)
RDW: 16.7 % — ABNORMAL HIGH (ref 11.7–15.4)
WBC: 8.3 10*3/uL (ref 3.4–10.8)

## 2020-04-28 LAB — LIPID PANEL
Chol/HDL Ratio: 3.8 ratio (ref 0.0–4.4)
Cholesterol, Total: 181 mg/dL (ref 100–199)
HDL: 48 mg/dL (ref >39)
LDL Chol Calc (NIH): 108 mg/dL — ABNORMAL HIGH (ref 0–99)
Triglycerides: 141 mg/dL (ref 0–149)
VLDL Cholesterol Cal: 25 mg/dL (ref 5–40)

## 2020-04-28 LAB — HIV ANTIBODY (ROUTINE TESTING W REFLEX): HIV Screen 4th Generation wRfx: NONREACTIVE

## 2020-04-28 LAB — VITAMIN D 25 HYDROXY (VIT D DEFICIENCY, FRACTURES): Vit D, 25-Hydroxy: 24 ng/mL — ABNORMAL LOW (ref 30.0–100.0)

## 2020-04-28 LAB — HEMOGLOBIN A1C
Est. average glucose Bld gHb Est-mCnc: 163 mg/dL
Hgb A1c MFr Bld: 7.3 % — ABNORMAL HIGH (ref 4.8–5.6)

## 2020-05-01 MED ORDER — VITAMIN D (ERGOCALCIFEROL) 1.25 MG (50000 UNIT) PO CAPS: 50000.0000 [IU] | ORAL_CAPSULE | ORAL | 0 refills | Status: DC

## 2020-05-03 ENCOUNTER — Other Ambulatory Visit: Payer: Self-pay

## 2020-05-03 MED ORDER — PRAVASTATIN SODIUM 40 MG PO TABS: 40.0000 mg | ORAL_TABLET | Freq: Every evening | ORAL | 1 refills | Status: DC

## 2020-05-05 LAB — CYTOLOGY - PAP
Comment: NEGATIVE
Diagnosis: UNDETERMINED — AB
High risk HPV: NEGATIVE

## 2020-05-22 ENCOUNTER — Other Ambulatory Visit: Payer: Self-pay

## 2020-05-22 ENCOUNTER — Ambulatory Visit (INDEPENDENT_AMBULATORY_CARE_PROVIDER_SITE_OTHER): Admitting: Pulmonary Disease

## 2020-05-22 ENCOUNTER — Ambulatory Visit (INDEPENDENT_AMBULATORY_CARE_PROVIDER_SITE_OTHER)

## 2020-05-22 ENCOUNTER — Encounter: Payer: Self-pay | Admitting: Pulmonary Disease

## 2020-05-22 VITALS — BP 132/84 | HR 88 | Temp 98.0°F | Ht 59.0 in | Wt 262.0 lb

## 2020-05-22 DIAGNOSIS — R059 Cough, unspecified: Secondary | ICD-10-CM

## 2020-05-22 NOTE — Patient Instructions (Signed)
Chronic cough History of bronchitis Excessive weight gain  We will check your thyroid function tests-abnormal thyroid function secondary to weight gain  Breathing study to assess lung function Chest x-ray to assess lung structure  Graded exercises as tolerated -diet and exercise as best as we can  I will see you back in 6 to 8 weeks

## 2020-05-22 NOTE — Progress Notes (Signed)
Marisa [name]    [id number]    October 25, 1960  Primary Care Physician:Sanders, Melina Schools, MD  Referring Physician: Arnette Felts, FNP [address] [place],  [place] [address]  Chief complaint:   Patient is being seen for chronic cough  HPI: Treated for URI end of March  Had history of recurrent bronchitis Worked in the [employer] office, felt she was exposed to a lot of dust -When she has a mask on she usually will not have as many symptoms  No level of asthma No history of significant allergies known to our Denies any reflux symptoms  She has gained a significant amount of weight recently No history of thyroid disorder  She does have a history of diabetes, hypertension for which she is on medications and compliant  Never smoker No other significant occupational predisposition Oldest daughter may have some allergies  Outpatient Encounter Medications as of 05/22/2020  Medication Sig  . albuterol (PROVENTIL) (2.5 MG/3ML) 0.083% nebulizer solution Take 3 mLs (2.5 mg total) by nebulization every 4 (four) hours as needed for wheezing or shortness of breath (for neb machine ordered today.).  Marland Kitchen albuterol (VENTOLIN HFA) 108 (90 Base) MCG/ACT inhaler Inhale 2 puffs into the lungs every 6 (six) hours as needed for wheezing or shortness of breath (and cough).  Marland Kitchen DM-APAP-CPM (CORICIDIN HBP FLU PO) Take by mouth.  . linaclotide (LINZESS) 145 MCG CAPS capsule TK 1 C PO  BEFORE FIRST MEAL OF THE DAY.  Marland Kitchen Olmesartan-amLODIPine-HCTZ 40-10-25 MG TABS Take 1 tablet by mouth daily.  . pravastatin (PRAVACHOL) 40 MG tablet Take 1 tablet (40 mg total) by mouth every evening.  . Semaglutide, 1 MG/DOSE, (OZEMPIC, 1 MG/DOSE,) 2 MG/1.5ML SOPN Inject 1 mg into the skin once a week.  . Vitamin D, Ergocalciferol, (DRISDOL) 1.25 MG (50000 UNIT) CAPS capsule Take 1 capsule (50,000 Units total) by mouth every 7 (seven) days.  . fluticasone (FLOVENT DISKUS) 50 MCG/BLIST diskus inhaler Inhale  into the lungs.  (Patient not taking: Reported on 05/22/2020)   No facility-administered encounter medications on file as of 05/22/2020.    Allergies as of 05/22/2020  . (No Known Allergies)    Past Medical History:  Diagnosis Date  . Borderline diabetes   . Hypertension     Past Surgical History:  Procedure Laterality Date  . ABDOMINAL HYSTERECTOMY    . CESAREAN SECTION    . SHOULDER SURGERY      Family History  Problem Relation Age of Onset  . Diabetes Mother   . Hypertension Mother     Social History   Socioeconomic History  . Marital status: Married    Spouse name: Not on file  . Number of children: Not on file  . Years of education: Not on file  . Highest education level: Not on file  Occupational History  . Not on file  Tobacco Use  . Smoking status: Never Smoker  . Smokeless tobacco: Never Used  Vaping Use  . Vaping Use: Never used  Substance and Sexual Activity  . Alcohol use: No  . Drug use: No  . Sexual activity: Not on file  Other Topics Concern  . Not on file  Social History Narrative  . Not on file   Social Determinants of Health   Financial Resource Strain: Not on file  Food Insecurity: Not on file  Transportation Needs: Not on file  Physical Activity: Not on file  Stress: Not on file  Social  Connections: Not on file  Intimate Partner Violence: Not on file    Review of Systems  Constitutional: Positive for unexpected weight change.  Respiratory: Positive for cough and shortness of breath.   Cardiovascular: Positive for leg swelling.    Vitals:   05/22/20 1437  BP: 132/84  Pulse: 88  Temp: 98 F (36.7 C)  SpO2: 98%     Physical Exam Constitutional:      Appearance: She is obese.  HENT:     Head: Normocephalic.     Mouth/Throat:     Mouth: Mucous membranes are moist.     Comments: Macroglossia Eyes:     General:        Right eye: No discharge.     Pupils: Pupils are equal, round, and reactive to light.   Cardiovascular:     Rate and Rhythm: Normal rate.     Heart sounds: No murmur heard. No friction rub.  Pulmonary:     Effort: No respiratory distress.     Breath sounds: No stridor. No wheezing or rhonchi.  Musculoskeletal:     Cervical back: No rigidity or tenderness.  Neurological:     Mental Status: She is alert.    Data Reviewed: Recent blood work 04/27/2020 shows no anemia Normal chemistry  Chest x-ray reviewed showing no acute infiltrate, 4/25  Assessment:  .  Chronic cough -No history of reflux -Possible allergies  .  History of bronchitis  .  Obesity with increasing weight gain  Plan/Recommendations: Obtain thyroid function test.  Obtain pulmonary function test Obtain chest x-ray  Graded exercises as tolerated  Follow-up in 6 to 8 weeks  Virl Diamond MD [facility] 05/22/2020, 3:02 PM  CC: Arnette Felts, FNP

## 2020-05-30 LAB — TSH+T3+FREE T4+T3 FREE
Free T-3: 3.2 pg/mL
Free T4 by Dialysis: 1.2 ng/dL
TSH: 0.59 uU/mL
Triiodothyronine (T-3), Serum: 112 ng/dL

## 2020-06-23 ENCOUNTER — Ambulatory Visit: Admitting: Nurse Practitioner

## 2020-06-27 ENCOUNTER — Encounter: Admitting: Nurse Practitioner

## 2020-06-27 ENCOUNTER — Other Ambulatory Visit: Payer: Self-pay

## 2020-06-27 ENCOUNTER — Other Ambulatory Visit: Payer: Self-pay | Admitting: Nurse Practitioner

## 2020-06-27 VITALS — BP 136/72 | HR 86 | Temp 98.4°F | Ht 60.8 in | Wt 263.8 lb

## 2020-06-27 DIAGNOSIS — R87610 Atypical squamous cells of undetermined significance on cytologic smear of cervix (ASC-US): Secondary | ICD-10-CM

## 2020-06-27 MED ORDER — FOLIC ACID 1 MG PO TABS: 1.0000 mg | ORAL_TABLET | Freq: Every day | ORAL | 3 refills | Status: DC

## 2020-06-27 NOTE — Progress Notes (Signed)
This encounter was created in error - please disregard.

## 2020-06-27 NOTE — Patient Instructions (Signed)

## 2020-06-27 NOTE — Progress Notes (Signed)
I,Bralee Feldt,acting as a Neurosurgeon for [employer], NP.,have documented all relevant documentation on the behalf of [employer], NP,as directed by  Charlesetta Ivory, NP while in the presence of Charlesetta Ivory, NP.  This visit occurred during the SARS-CoV-2 public health emergency.  Safety protocols were in place, including screening questions prior to the visit, additional usage of staff PPE, and extensive cleaning of exam room while observing appropriate contact time as indicated for disinfecting solutions.  Subjective:     Patient ID: Marisa [name] , female    DOB: 1960-11-15 , 60 y.o.   MRN: [id number]   Chief Complaint  Patient presents with  . Abnormal Pap Smear    HPI  Patient is here for repeat pap.     Past Medical History:  Diagnosis Date  . Borderline diabetes   . Hypertension      Family History  Problem Relation Age of Onset  . Diabetes Mother   . Hypertension Mother      Current Outpatient Medications:  .  albuterol (PROVENTIL) (2.5 MG/3ML) 0.083% nebulizer solution, Take 3 mLs (2.5 mg total) by nebulization every 4 (four) hours as needed for wheezing or shortness of breath (for neb machine ordered today.)., Disp: 75 mL, Rfl: 1 .  albuterol (VENTOLIN HFA) 108 (90 Base) MCG/ACT inhaler, Inhale 2 puffs into the lungs every 6 (six) hours as needed for wheezing or shortness of breath (and cough)., Disp: 8 g, Rfl: 3 .  DM-APAP-CPM (CORICIDIN HBP FLU PO), Take by mouth., Disp: , Rfl:  .  fluticasone (FLOVENT DISKUS) 50 MCG/BLIST diskus inhaler, Inhale into the lungs.  (Patient not taking: Reported on 05/22/2020), Disp: , Rfl:  .  linaclotide (LINZESS) 145 MCG CAPS capsule, TK 1 C PO  BEFORE FIRST MEAL OF THE DAY., Disp: 90 capsule, Rfl: 1 .  Olmesartan-amLODIPine-HCTZ 40-10-25 MG TABS, Take 1 tablet by mouth daily., Disp: 90 tablet, Rfl: 1 .  pravastatin (PRAVACHOL) 40 MG tablet, Take 1 tablet (40 mg total) by mouth every evening., Disp: 30  tablet, Rfl: 1 .  Semaglutide, 1 MG/DOSE, (OZEMPIC, 1 MG/DOSE,) 2 MG/1.5ML SOPN, Inject 1 mg into the skin once a week., Disp: 4.5 mL, Rfl: 1 .  Vitamin D, Ergocalciferol, (DRISDOL) 1.25 MG (50000 UNIT) CAPS capsule, Take 1 capsule (50,000 Units total) by mouth every 7 (seven) days., Disp: 12 capsule, Rfl: 0   No Known Allergies   Review of Systems  Constitutional: Negative.   Respiratory: Negative.   Cardiovascular: Negative.   Gastrointestinal: Negative.   Neurological: Negative.      Today's Vitals   06/27/20 1612  BP: 136/72  Pulse: 86  Temp: 98.4 F (36.9 C)  TempSrc: Oral  Weight: 263 lb 12.8 oz (119.7 kg)  Height: 5' 0.8" (1.544 m)   Body mass index is 50.17 kg/m.  Wt Readings from Last 3 Encounters:  06/27/20 263 lb 12.8 oz (119.7 kg)  05/22/20 262 lb (118.8 kg)  04/27/20 253 lb 9.6 oz (115 kg)    Objective:  Physical Exam      Assessment And Plan:     1. Atypical squamous cells of undetermined significance on cytologic smear of cervix (ASC-US)     Patient was given opportunity to ask questions. Patient verbalized understanding of the plan and was able to repeat key elements of the plan. All questions were answered to their satisfaction.  Delana Meyer   I, Delana Meyer, have reviewed all documentation for this visit. The documentation on 06/27/20 for the exam,  diagnosis, procedures, and orders are all accurate and complete.   IF YOU HAVE BEEN REFERRED TO A SPECIALIST, IT MAY TAKE 1-2 WEEKS TO SCHEDULE/PROCESS THE REFERRAL. IF YOU HAVE NOT HEARD FROM US/SPECIALIST IN TWO WEEKS, PLEASE GIVE US A CALL AT [phone].   THE PATIENT IS ENCOURAGED TO PRACTICE SOCIAL DISTANCING DUE TO THE COVID-19 PANDEMIC.   

## 2020-08-17 ENCOUNTER — Ambulatory Visit: Admitting: Internal Medicine

## 2020-08-30 ENCOUNTER — Encounter: Payer: Self-pay | Admitting: Internal Medicine

## 2020-08-30 ENCOUNTER — Ambulatory Visit (INDEPENDENT_AMBULATORY_CARE_PROVIDER_SITE_OTHER): Admitting: Internal Medicine

## 2020-08-30 ENCOUNTER — Other Ambulatory Visit: Payer: Self-pay

## 2020-08-30 VITALS — BP 126/80 | HR 74 | Temp 98.1°F | Ht 60.8 in | Wt 261.0 lb

## 2020-08-30 DIAGNOSIS — I10 Essential (primary) hypertension: Secondary | ICD-10-CM

## 2020-08-30 DIAGNOSIS — E1165 Type 2 diabetes mellitus with hyperglycemia: Secondary | ICD-10-CM

## 2020-08-30 DIAGNOSIS — Z23 Encounter for immunization: Secondary | ICD-10-CM

## 2020-08-30 DIAGNOSIS — E7800 Pure hypercholesterolemia, unspecified: Secondary | ICD-10-CM | POA: Diagnosis not present

## 2020-08-30 MED ORDER — PRAVASTATIN SODIUM 40 MG PO TABS: 40.0000 mg | ORAL_TABLET | Freq: Every evening | ORAL | 1 refills | Status: DC

## 2020-08-30 MED ORDER — ZOSTER VAC RECOMB ADJUVANTED 50 MCG/0.5ML IM SUSR: 0.5000 mL | Freq: Once | INTRAMUSCULAR | 0 refills | Status: AC

## 2020-08-30 NOTE — Progress Notes (Signed)
I,Yamilka Roman [employer] as a Education administrator for Maximino Greenland, MD.,have documented all relevant documentation on the behalf of Maximino Greenland, MD,as directed by  Maximino Greenland, MD while in the presence of Maximino Greenland, MD.  This visit occurred during the SARS-CoV-2 public health emergency.  Safety protocols were in place, including screening questions prior to the visit, additional usage of staff PPE, and extensive cleaning of exam room while observing appropriate contact time as indicated for disinfecting solutions.  Subjective:     Patient ID: Marisa [name] , female    DOB: August 19, 1960 , 60 y.o.   MRN: [id number]   Chief Complaint  Patient presents with   Hypertension   Diabetes    HPI  She is here today for diabetes and BP f/u.  She reports compliance with meds.  She denies headaches, chest pain and shortness of breath. Admits she is not exercising as much as she should.   Hypertension This is a chronic problem. The current episode started more than 1 year ago. The problem has been gradually improving since onset. The problem is uncontrolled. Pertinent negatives include no blurred vision. Risk factors for coronary artery disease include diabetes mellitus, dyslipidemia, obesity, post-menopausal state and sedentary lifestyle. The current treatment provides moderate improvement. Compliance problems include exercise.   Diabetes She presents for her follow-up diabetic visit. She has type 2 diabetes mellitus. Her disease course has been stable. There are no hypoglycemic associated symptoms. Pertinent negatives for diabetes include no blurred vision. There are no hypoglycemic complications. Risk factors for coronary artery disease include diabetes mellitus, hypertension, sedentary lifestyle, post-menopausal, dyslipidemia and obesity. She is following a diabetic diet. She does not see a podiatrist.   Past Medical History:  Diagnosis Date   Borderline diabetes    Hypertension       Family History  Problem Relation Age of Onset   Diabetes Mother    Hypertension Mother      Current Outpatient Medications:    albuterol (PROVENTIL) (2.5 MG/3ML) 0.083% nebulizer solution, Take 3 mLs (2.5 mg total) by nebulization every 4 (four) hours as needed for wheezing or shortness of breath (for neb machine ordered today.)., Disp: 75 mL, Rfl: 1   albuterol (VENTOLIN HFA) 108 (90 Base) MCG/ACT inhaler, Inhale 2 puffs into the lungs every 6 (six) hours as needed for wheezing or shortness of breath (and cough)., Disp: 8 g, Rfl: 3   folic acid (FOLVITE) 1 MG tablet, Take 1 tablet (1 mg total) by mouth daily., Disp: 60 tablet, Rfl: 3   linaclotide (LINZESS) 145 MCG CAPS capsule, TK 1 C PO  BEFORE FIRST MEAL OF THE DAY., Disp: 90 capsule, Rfl: 1   Olmesartan-amLODIPine-HCTZ 40-10-25 MG TABS, Take 1 tablet by mouth daily., Disp: 90 tablet, Rfl: 1   Semaglutide, 1 MG/DOSE, (OZEMPIC, 1 MG/DOSE,) 2 MG/1.5ML SOPN, Inject 1 mg into the skin once a week., Disp: 4.5 mL, Rfl: 1   Vitamin D, Ergocalciferol, (DRISDOL) 1.25 MG (50000 UNIT) CAPS capsule, Take 1 capsule (50,000 Units total) by mouth every 7 (seven) days., Disp: 12 capsule, Rfl: 0   Zoster Vaccine Adjuvanted (SHINGRIX) injection, Inject 0.5 mLs into the muscle once for 1 dose., Disp: 0.5 mL, Rfl: 0   DM-APAP-CPM (CORICIDIN HBP FLU PO), Take by mouth. (Patient not taking: Reported on 08/30/2020), Disp: , Rfl:    fluticasone (FLOVENT DISKUS) 50 MCG/BLIST diskus inhaler, Inhale into the lungs.  (Patient not taking: No sig reported), Disp: , Rfl:    pravastatin (  PRAVACHOL) 40 MG tablet, Take 1 tablet (40 mg total) by mouth every evening., Disp: 90 tablet, Rfl: 1   No Known Allergies   Review of Systems  Constitutional: Negative.   Eyes:  Negative for blurred vision.  Respiratory: Negative.    Cardiovascular: Negative.   Neurological: Negative.   Psychiatric/Behavioral: Negative.      Today's Vitals   08/30/20 0958  BP: 126/80  Pulse:  74  Temp: 98.1 F (36.7 C)  Weight: 261 lb (118.4 kg)  Height: 5' 0.8" (1.544 m)   Body mass index is 49.64 kg/m.  Wt Readings from Last 3 Encounters:  08/30/20 261 lb (118.4 kg)  06/27/20 263 lb 12.8 oz (119.7 kg)  05/22/20 262 lb (118.8 kg)     Objective:  Physical Exam Vitals and nursing note reviewed.  Constitutional:      Appearance: Normal appearance. She is obese.  HENT:     Head: Normocephalic and atraumatic.     Nose:     Comments: Masked     Mouth/Throat:     Comments: Masked  Cardiovascular:     Rate and Rhythm: Normal rate and regular rhythm.     Heart sounds: Normal heart sounds.  Pulmonary:     Effort: Pulmonary effort is normal.     Breath sounds: Normal breath sounds.  Musculoskeletal:     Cervical back: Normal range of motion.  Skin:    General: Skin is warm.  Neurological:     General: No focal deficit present.     Mental Status: She is alert.  Psychiatric:        Mood and Affect: Mood normal.        Behavior: Behavior normal.        Assessment And Plan:     1. Essential hypertension, benign Comments: Chronic, well controlled. She is enocuraged to follow low sodium diet. I will check blood type as requested.  - CMP14+EGFR - ABO AND RH   2. Uncontrolled type 2 diabetes mellitus with hyperglycemia ([place]) Comments: Chronic, advised that her a1c has steadily increased over the past 18 months. Importance of dietary, medication and exercise compliance was d/w pt. F/u 4 mos. - Hemoglobin A1c  3. Pure hypercholesterolemia Comments: Chronic, prescribed pravastatin in April 2022. However, she has not taken since early June. Rx refilled, she will rto in 6 weeks for lab visit.   4. Immunization due Comments: I will send rx Shingrix to local pharmacy.    Patient was given opportunity to ask questions. Patient verbalized understanding of the plan and was able to repeat key elements of the plan. All questions were answered to their satisfaction.   I,  Maximino Greenland, MD, have reviewed all documentation for this visit. The documentation on 08/30/20 for the exam, diagnosis, procedures, and orders are all accurate and complete.   IF YOU HAVE BEEN REFERRED TO A SPECIALIST, IT MAY TAKE 1-2 WEEKS TO SCHEDULE/PROCESS THE REFERRAL. IF YOU HAVE NOT HEARD FROM US/SPECIALIST IN TWO WEEKS, PLEASE GIVE [place] A CALL AT [phone].   THE PATIENT IS ENCOURAGED TO PRACTICE SOCIAL DISTANCING DUE TO THE COVID-19 PANDEMIC.

## 2020-08-30 NOTE — Patient Instructions (Addendum)
The 10-year ASCVD risk score Denman George DC Montez Hageman., et al., 2013) is: 11.3%   Values used to calculate the score:     Age: 60 years     Sex: Female     Is Non-Hispanic African American: Yes     Diabetic: Yes     Tobacco smoker: No     Systolic Blood Pressure: 126 mmHg     Is BP treated: No     HDL Cholesterol: 48 mg/dL     Total Cholesterol: 181 mg/dL   Hypertension, Adult Hypertension is another name for high blood pressure. High blood pressure forces your heart to work harder to pump blood. This can cause problems overtime. There are two numbers in a blood pressure reading. There is a top number (systolic) over a bottom number (diastolic). It is best to have a blood pressure that is below 120/80. Healthy choicescan help lower your blood pressure, or you may need medicine to help lower it. What are the causes? The cause of this condition is not known. Some conditions may be related tohigh blood pressure. What increases the risk? Smoking. Having type 2 diabetes mellitus, high cholesterol, or both. Not getting enough exercise or physical activity. Being overweight. Having too much fat, sugar, calories, or salt (sodium) in your diet. Drinking too much alcohol. Having long-term (chronic) kidney disease. Having a family history of high blood pressure. Age. Risk increases with age. Race. You may be at higher risk if you are African American. Gender. Men are at higher risk than women before age 39. After age 69, women are at higher risk than men. Having obstructive sleep apnea. Stress. What are the signs or symptoms? High blood pressure may not cause symptoms. Very high blood pressure (hypertensive crisis) may cause: Headache. Feelings of worry or nervousness (anxiety). Shortness of breath. Nosebleed. A feeling of being sick to your stomach (nausea). Throwing up (vomiting). Changes in how you see. Very bad chest pain. Seizures. How is this treated? This condition is treated by making  healthy lifestyle changes, such as: Eating healthy foods. Exercising more. Drinking less alcohol. Your health care provider may prescribe medicine if lifestyle changes are not enough to get your blood pressure under control, and if: Your top number is above 130. Your bottom number is above 80. Your personal target blood pressure may vary. Follow these instructions at home: Eating and drinking  If told, follow the DASH eating plan. To follow this plan: Fill one half of your plate at each meal with fruits and vegetables. Fill one fourth of your plate at each meal with whole grains. Whole grains include whole-wheat pasta, brown rice, and whole-grain bread. Eat or drink low-fat dairy products, such as skim milk or low-fat yogurt. Fill one fourth of your plate at each meal with low-fat (lean) proteins. Low-fat proteins include fish, chicken without skin, eggs, beans, and tofu. Avoid fatty meat, cured and processed meat, or chicken with skin. Avoid pre-made or processed food. Eat less than 1,500 mg of salt each day. Do not drink alcohol if: Your doctor tells you not to drink. You are pregnant, may be pregnant, or are planning to become pregnant. If you drink alcohol: Limit how much you use to: 0-1 drink a day for women. 0-2 drinks a day for men. Be aware of how much alcohol is in your drink. In the [place] one drink equals one 12 oz bottle of beer (355 mL), one 5 oz glass of wine (148 mL), or one 1 oz glass  of hard liquor (44 mL).  Lifestyle  Work with your doctor to stay at a healthy weight or to lose weight. Ask your doctor what the best weight is for you. Get at least 30 minutes of exercise most days of the week. This may include walking, swimming, or biking. Get at least 30 minutes of exercise that strengthens your muscles (resistance exercise) at least 3 days a week. This may include lifting weights or doing Pilates. Do not use any products that contain nicotine or tobacco, such as  cigarettes, e-cigarettes, and chewing tobacco. If you need help quitting, ask your doctor. Check your blood pressure at home as told by your doctor. Keep all follow-up visits as told by your doctor. This is important.  Medicines Take over-the-counter and prescription medicines only as told by your doctor. Follow directions carefully. Do not skip doses of blood pressure medicine. The medicine does not work as well if you skip doses. Skipping doses also puts you at risk for problems. Ask your doctor about side effects or reactions to medicines that you should watch for. Contact a doctor if you: Think you are having a reaction to the medicine you are taking. Have headaches that keep coming back (recurring). Feel dizzy. Have swelling in your ankles. Have trouble with your vision. Get help right away if you: Get a very bad headache. Start to feel mixed up (confused). Feel weak or numb. Feel faint. Have very bad pain in your: Chest. Belly (abdomen). Throw up more than once. Have trouble breathing. Summary Hypertension is another name for high blood pressure. High blood pressure forces your heart to work harder to pump blood. For most people, a normal blood pressure is less than 120/80. Making healthy choices can help lower blood pressure. If your blood pressure does not get lower with healthy choices, you may need to take medicine. This information is not intended to replace advice given to you by your health care provider. Make sure you discuss any questions you have with your healthcare provider. Document Revised: 09/24/2017 Document Reviewed: 09/24/2017 Elsevier Patient Education  2022 [employer].

## 2020-08-31 LAB — CMP14+EGFR
ALT: 16 IU/L (ref 0–32)
AST: 16 IU/L (ref 0–40)
Albumin/Globulin Ratio: 1.3 (ref 1.2–2.2)
Albumin: 4.2 g/dL (ref 3.8–4.9)
Alkaline Phosphatase: 108 IU/L (ref 44–121)
BUN/Creatinine Ratio: 9 — ABNORMAL LOW (ref 12–28)
BUN: 7 mg/dL — ABNORMAL LOW (ref 8–27)
Bilirubin Total: 0.4 mg/dL (ref 0.0–1.2)
CO2: 25 mmol/L (ref 20–29)
Calcium: 8.9 mg/dL (ref 8.7–10.3)
Chloride: 100 mmol/L (ref 96–106)
Creatinine, Ser: 0.82 mg/dL (ref 0.57–1.00)
Globulin, Total: 3.2 g/dL (ref 1.5–4.5)
Glucose: 168 mg/dL — ABNORMAL HIGH (ref 65–99)
Potassium: 4 mmol/L (ref 3.5–5.2)
Sodium: 140 mmol/L (ref 134–144)
Total Protein: 7.4 g/dL (ref 6.0–8.5)
eGFR: 82 mL/min/{1.73_m2} (ref >59)

## 2020-08-31 LAB — ABO AND RH: Rh Factor: POSITIVE

## 2020-08-31 LAB — HEMOGLOBIN A1C
Est. average glucose Bld gHb Est-mCnc: 180 mg/dL
Hgb A1c MFr Bld: 7.9 % — ABNORMAL HIGH (ref 4.8–5.6)

## 2020-10-11 ENCOUNTER — Other Ambulatory Visit

## 2020-10-11 ENCOUNTER — Other Ambulatory Visit: Payer: Self-pay

## 2020-10-11 DIAGNOSIS — E785 Hyperlipidemia, unspecified: Secondary | ICD-10-CM

## 2020-10-12 LAB — LIPID PANEL
Chol/HDL Ratio: 4.4 ratio (ref 0.0–4.4)
Cholesterol, Total: 167 mg/dL (ref 100–199)
HDL: 38 mg/dL — ABNORMAL LOW (ref >39)
LDL Chol Calc (NIH): 96 mg/dL (ref 0–99)
Triglycerides: 192 mg/dL — ABNORMAL HIGH (ref 0–149)
VLDL Cholesterol Cal: 33 mg/dL (ref 5–40)

## 2020-10-12 LAB — ALT: ALT: 14 IU/L (ref 0–32)

## 2020-10-20 ENCOUNTER — Other Ambulatory Visit: Payer: Self-pay

## 2020-10-20 MED ORDER — PRAVASTATIN SODIUM 80 MG PO TABS: ORAL_TABLET | ORAL | 0 refills | Status: DC

## 2020-10-20 NOTE — Progress Notes (Signed)
error 

## 2021-01-16 ENCOUNTER — Ambulatory Visit (INDEPENDENT_AMBULATORY_CARE_PROVIDER_SITE_OTHER): Admitting: Internal Medicine

## 2021-01-16 ENCOUNTER — Other Ambulatory Visit: Payer: Self-pay

## 2021-01-16 ENCOUNTER — Encounter: Payer: Self-pay | Admitting: Internal Medicine

## 2021-01-16 VITALS — BP 160/102 | HR 80 | Temp 98.0°F | Ht 60.8 in | Wt 259.0 lb

## 2021-01-16 DIAGNOSIS — E1165 Type 2 diabetes mellitus with hyperglycemia: Secondary | ICD-10-CM

## 2021-01-16 DIAGNOSIS — R87610 Atypical squamous cells of undetermined significance on cytologic smear of cervix (ASC-US): Secondary | ICD-10-CM

## 2021-01-16 DIAGNOSIS — E66813 Obesity, class 3: Secondary | ICD-10-CM

## 2021-01-16 DIAGNOSIS — I10 Essential (primary) hypertension: Secondary | ICD-10-CM | POA: Diagnosis not present

## 2021-01-16 DIAGNOSIS — E7800 Pure hypercholesterolemia, unspecified: Secondary | ICD-10-CM

## 2021-01-16 DIAGNOSIS — K5909 Other constipation: Secondary | ICD-10-CM | POA: Diagnosis not present

## 2021-01-16 DIAGNOSIS — Z6841 Body Mass Index (BMI) 40.0 and over, adult: Secondary | ICD-10-CM

## 2021-01-16 LAB — CMP14+EGFR
ALT: 13 IU/L (ref 0–32)
AST: 17 IU/L (ref 0–40)
Albumin/Globulin Ratio: 1.3 (ref 1.2–2.2)
Albumin: 4.1 g/dL (ref 3.8–4.8)
Alkaline Phosphatase: 107 IU/L (ref 44–121)
BUN/Creatinine Ratio: 16 (ref 12–28)
BUN: 12 mg/dL (ref 8–27)
Bilirubin Total: 0.4 mg/dL (ref 0.0–1.2)
CO2: 27 mmol/L (ref 20–29)
Calcium: 9.1 mg/dL (ref 8.7–10.3)
Chloride: 101 mmol/L (ref 96–106)
Creatinine, Ser: 0.74 mg/dL (ref 0.57–1.00)
Globulin, Total: 3.1 g/dL (ref 1.5–4.5)
Glucose: 157 mg/dL — ABNORMAL HIGH (ref 70–99)
Potassium: 3.7 mmol/L (ref 3.5–5.2)
Sodium: 141 mmol/L (ref 134–144)
Total Protein: 7.2 g/dL (ref 6.0–8.5)
eGFR: 92 mL/min/{1.73_m2} (ref >59)

## 2021-01-16 LAB — POCT URINALYSIS DIPSTICK
Bilirubin, UA: NEGATIVE
Blood, UA: NEGATIVE
Glucose, UA: NEGATIVE
Ketones, UA: NEGATIVE
Leukocytes, UA: NEGATIVE
Nitrite, UA: NEGATIVE
Protein, UA: NEGATIVE
Spec Grav, UA: 1.02 (ref 1.010–1.025)
Urobilinogen, UA: 0.2 E.U./dL
pH, UA: 7 (ref 5.0–8.0)

## 2021-01-16 LAB — HEMOGLOBIN A1C
Est. average glucose Bld gHb Est-mCnc: 169 mg/dL
Hgb A1c MFr Bld: 7.5 % — ABNORMAL HIGH (ref 4.8–5.6)

## 2021-01-16 LAB — POCT UA - MICROALBUMIN
Albumin/Creatinine Ratio, Urine, POC: 30
Creatinine, POC: 200 mg/dL
Microalbumin Ur, POC: 30 mg/L

## 2021-01-16 MED ORDER — LINACLOTIDE 145 MCG PO CAPS: ORAL_CAPSULE | ORAL | 1 refills | Status: DC

## 2021-01-16 MED ORDER — PRAVASTATIN SODIUM 80 MG PO TABS: ORAL_TABLET | ORAL | 1 refills | Status: DC

## 2021-01-16 MED ORDER — OLMESARTAN-AMLODIPINE-HCTZ 40-10-25 MG PO TABS: 1.0000 | ORAL_TABLET | Freq: Every day | ORAL | 1 refills | Status: DC

## 2021-01-16 MED ORDER — OZEMPIC (1 MG/DOSE) 2 MG/1.5ML ~~LOC~~ SOPN
1.0000 mg | PEN_INJECTOR | SUBCUTANEOUS | 1 refills | Status: DC
Start: 2021-01-16 — End: 2021-05-16

## 2021-01-16 MED ORDER — FOLIC ACID 1 MG PO TABS: ORAL_TABLET | ORAL | 1 refills | Status: DC

## 2021-01-16 NOTE — Patient Instructions (Signed)

## 2021-01-16 NOTE — Progress Notes (Signed)
Rich Brave [place],acting as a Education administrator for Maximino Greenland, MD.,have documented all relevant documentation on the behalf of Maximino Greenland, MD,as directed by  Maximino Greenland, MD while in the presence of Maximino Greenland, MD.  This visit occurred during the SARS-CoV-2 public health emergency.  Safety protocols were in place, including screening questions prior to the visit, additional usage of staff PPE, and extensive cleaning of exam room while observing appropriate contact time as indicated for disinfecting solutions.  Subjective:     Patient ID: Marisa [name] , female    DOB: 1961-01-17 , 60 y.o.   MRN: [id number]   Chief Complaint  Patient presents with   Diabetes   Hypertension    HPI  She is here today for diabetes and BP f/u.  She reports compliance with meds. Patient stated she has been under a lot of stress with her Mom in the hospital. She does report she feels pressure in her face and can feel that her blood pressure is high. She reports she got upset with hospital staff just prior to her appt. She denies chest pain, palpitations and shortness of breath.  Diabetes She presents for her follow-up diabetic visit. She has type 2 diabetes mellitus. Her disease course has been stable. There are no hypoglycemic associated symptoms. Pertinent negatives for diabetes include no blurred vision. There are no hypoglycemic complications. Risk factors for coronary artery disease include diabetes mellitus, hypertension, sedentary lifestyle, post-menopausal, dyslipidemia and obesity. She is following a diabetic diet. She does not see a podiatrist. Hypertension This is a chronic problem. The current episode started more than 1 year ago. The problem has been gradually improving since onset. The problem is uncontrolled. Pertinent negatives include no blurred vision. Risk factors for coronary artery disease include diabetes mellitus, dyslipidemia, obesity, post-menopausal state and sedentary  lifestyle. The current treatment provides moderate improvement. Compliance problems include exercise.     Past Medical History:  Diagnosis Date   Borderline diabetes    Hypertension      Family History  Problem Relation Age of Onset   Diabetes Mother    Hypertension Mother      Current Outpatient Medications:    albuterol (PROVENTIL) (2.5 MG/3ML) 0.083% nebulizer solution, Take 3 mLs (2.5 mg total) by nebulization every 4 (four) hours as needed for wheezing or shortness of breath (for neb machine ordered today.)., Disp: 75 mL, Rfl: 1   albuterol (VENTOLIN HFA) 108 (90 Base) MCG/ACT inhaler, Inhale 2 puffs into the lungs every 6 (six) hours as needed for wheezing or shortness of breath (and cough)., Disp: 8 g, Rfl: 3   Vitamin D, Ergocalciferol, (DRISDOL) 1.25 MG (50000 UNIT) CAPS capsule, Take 1 capsule (50,000 Units total) by mouth every 7 (seven) days., Disp: 12 capsule, Rfl: 0   DM-APAP-CPM (CORICIDIN HBP FLU PO), Take by mouth. (Patient not taking: Reported on 08/30/2020), Disp: , Rfl:    fluticasone (FLOVENT DISKUS) 50 MCG/BLIST diskus inhaler, Inhale into the lungs.  (Patient not taking: Reported on 05/22/2020), Disp: , Rfl:    folic acid (FOLVITE) 1 MG tablet, One tablet po daily, Disp: 90 tablet, Rfl: 1   linaclotide (LINZESS) 145 MCG CAPS capsule, TK 1 C PO  BEFORE FIRST MEAL OF THE DAY., Disp: 90 capsule, Rfl: 1   Olmesartan-amLODIPine-HCTZ 40-10-25 MG TABS, Take 1 tablet by mouth daily., Disp: 90 tablet, Rfl: 1   pravastatin (PRAVACHOL) 80 MG tablet, Take one tablet by mouth daily Monday through Friday. Skip weekends., Disp: 90  tablet, Rfl: 1   Semaglutide, 1 MG/DOSE, (OZEMPIC, 1 MG/DOSE,) 2 MG/1.5ML SOPN, Inject 1 mg into the skin once a week., Disp: 4.5 mL, Rfl: 1   No Known Allergies   Review of Systems  Constitutional: Negative.   Eyes:  Negative for blurred vision.  Respiratory: Negative.    Cardiovascular: Negative.   Neurological: Negative.   Psychiatric/Behavioral:  Negative.      Today's Vitals   01/16/21 0859 01/16/21 0905 01/16/21 0936  BP: (!) 160/100 (!) 162/100 (!) 160/102  Pulse: 80    Temp: 98 F (36.7 C)    Weight: 259 lb (117.5 kg)    Height: 5' 0.8" (1.544 m)    PainSc: 0-No pain     Body mass index is 49.26 kg/m.  Wt Readings from Last 3 Encounters:  01/16/21 259 lb (117.5 kg)  08/30/20 261 lb (118.4 kg)  06/27/20 263 lb 12.8 oz (119.7 kg)     Objective:  Physical Exam Vitals and nursing note reviewed.  Constitutional:      Appearance: Normal appearance.  HENT:     Head: Normocephalic and atraumatic.     Nose:     Comments: Masked     Mouth/Throat:     Comments: Masked  Eyes:     Extraocular Movements: Extraocular movements intact.  Cardiovascular:     Rate and Rhythm: Normal rate and regular rhythm.     Heart sounds: Normal heart sounds.  Pulmonary:     Effort: Pulmonary effort is normal.     Breath sounds: Normal breath sounds.  Musculoskeletal:     Cervical back: Normal range of motion.  Skin:    General: Skin is warm.  Neurological:     General: No focal deficit present.     Mental Status: She is alert.  Psychiatric:        Mood and Affect: Mood normal.        Behavior: Behavior normal.        Assessment And Plan:     1. Uncontrolled type 2 diabetes mellitus with hyperglycemia (HCC) Comments: Chronic, I will check labs as below. importance of medication, dietary and office visit compliance was d/w patient.  - CMP14+EGFR - Hemoglobin A1c - Semaglutide, 1 MG/DOSE, (OZEMPIC, 1 MG/DOSE,) 2 MG/1.5ML SOPN; Inject 1 mg into the skin once a week.  Dispense: 4.5 mL; Refill: 1 - POCT Urinalysis Dipstick (81002) - POCT UA - Microalbumin  2. Essential hypertension, benign Comments: Chronic, uncontrolled. She does not wish to change meds b/c her bp readings "at home are fine". She thinks it is situational. She agrees to rto for a nurse visit.  - Olmesartan-amLODIPine-HCTZ 40-10-25 MG TABS; Take 1 tablet by mouth  daily.  Dispense: 90 tablet; Refill: 1  3. Atypical squamous cells of undetermined significance on cytologic smear of cervix (ASC-US) Comments: Pt advised she needs repeat pap March/April 2023. She will continue with folic acid once daily.  - folic acid (FOLVITE) 1 MG tablet; One tablet po daily  Dispense: 90 tablet; Refill: 1  4. Chronic constipation Comments: Chronic, encouraged to increase daily activity. She was given refill of Linzess as well.  - linaclotide (LINZESS) 145 MCG CAPS capsule; TK 1 C PO  BEFORE FIRST MEAL OF THE DAY.  Dispense: 90 capsule; Refill: 1  5. Pure hypercholesterolemia Comments: Chronic, will refilll of pravastatin. Advised to follow heart healthy diet, take meds as prescribed avoid processed meats including bacon, sausage and deli meat - pravastatin (PRAVACHOL) 80 MG tablet; Take  one tablet by mouth daily Monday through Friday. Skip weekends.  Dispense: 90 tablet; Refill: 1  6. Class 3 severe obesity due to excess calories with serious comorbidity and body mass index (BMI) of 45.0 to 49.9 in adult [facility])  She is encouraged to initially strive for BMI less than 40 to decrease cardiac risk. Advised to aim for at least 150 minutes of exercise per week.    Patient was given opportunity to ask questions. Patient verbalized understanding of the plan and was able to repeat key elements of the plan. All questions were answered to their satisfaction.   I, Maximino Greenland, MD, have reviewed all documentation for this visit. The documentation on 01/16/21 for the exam, diagnosis, procedures, and orders are all accurate and complete.   IF YOU HAVE BEEN REFERRED TO A SPECIALIST, IT MAY TAKE 1-2 WEEKS TO SCHEDULE/PROCESS THE REFERRAL. IF YOU HAVE NOT HEARD FROM US/SPECIALIST IN TWO WEEKS, PLEASE GIVE [place] A CALL AT [phone].   THE PATIENT IS ENCOURAGED TO PRACTICE SOCIAL DISTANCING DUE TO THE COVID-19 PANDEMIC.

## 2021-02-06 ENCOUNTER — Other Ambulatory Visit: Payer: Self-pay

## 2021-02-06 ENCOUNTER — Ambulatory Visit

## 2021-02-06 VITALS — BP 136/84 | HR 88 | Temp 97.8°F | Ht 60.8 in

## 2021-02-06 DIAGNOSIS — I10 Essential (primary) hypertension: Secondary | ICD-10-CM

## 2021-02-06 NOTE — Patient Instructions (Signed)
Cooking With Less Salt Cooking with less salt is one way to reduce the amount of sodium you get from food. Sodium is one of the elements that make up salt. It is found naturally in foods and is also added to certain foods. Depending on your condition and overall health, your health care provider or dietitian may recommend that you reduce your sodium intake. Most people should have less than 2,300 milligrams (mg) of sodium each day. If you have high blood pressure (hypertension), you may need to limit your sodium to 1,500 mg each day. Follow the tipsbelow to help reduce your sodium intake. What are tips for eating less sodium? Reading food labels  Check the food label before buying or using packaged ingredients. Always check the label for the serving size and sodium content. Look for products with no more than 140 mg of sodium in one serving. Check the % Daily Value column to see what percent of the daily recommended amount of sodium is provided in one serving of the product. Foods with 5% or less in this column are considered low in sodium. Foods with 20% or higher are considered high in sodium. Do not choose foods with salt as one of the first three ingredients on the ingredients list. If salt is one of the first three ingredients, it usually means the item is high in sodium.  Shopping Buy sodium-free or low-sodium products. Look for the following words on food labels: Low-sodium. Sodium-free. Reduced-sodium. No salt added. Unsalted. Always check the sodium content even if foods are labeled as low-sodium or no salt added. Buy fresh foods. Cooking Use herbs, seasonings without salt, and spices as substitutes for salt. Use sodium-free baking soda when baking. Grill, braise, or roast foods to add flavor with less salt. Avoid adding salt to pasta, rice, or hot cereals. Drain and rinse canned vegetables, beans, and meat before use. Avoid adding salt when cooking sweets and desserts. Cook with  low-sodium ingredients. What foods are high in sodium? Vegetables Regular canned vegetables (not low-sodium or reduced-sodium). Sauerkraut, pickled vegetables, and relishes. Olives. French fries. Onion rings. Regular canned tomato sauce and paste. Regular tomato and vegetable juice. Frozenvegetables in sauces. Grains Instant hot cereals. Bread stuffing, pancake, and biscuit mixes. Croutons. Seasoned rice or pasta mixes. Noodle soup cups. Boxed or frozen macaroni and cheese. Regular salted crackers. Self-rising flour. Rolls. Bagels. Flourtortillas and wraps. Meats and other proteins Meat or fish that is salted, canned, smoked, cured, spiced, or pickled. This includes bacon, ham, sausages, hot dogs, corned beef, chipped beef, meat loaves, salt pork, jerky, pickled herring, anchovies, regular canned tuna, andsardines. Salted nuts. Dairy Processed cheese and cheese spreads. Cheese curds. Blue cheese. Feta cheese.String cheese. Regular cottage cheese. Buttermilk. Canned milk. The items listed above may not be a complete list of foods high in sodium. Actual amounts of sodium may be different depending on processing. Contact a dietitian for more information. What foods are low in sodium? Fruits Fresh, frozen, or canned fruit with no sauce added. Fruit juice. Vegetables Fresh or frozen vegetables with no sauce added. "No salt added" canned vegetables. "No salt added" tomato sauce and paste. Low-sodium orreduced-sodium tomato and vegetable juice. Grains Noodles, pasta, quinoa, rice. Shredded or puffed wheat or puffed rice. Regular or quick oats (not instant). Low-sodium crackers. Low-sodium bread. Whole-grainbread and whole-grain pasta. Unsalted popcorn. Meats and other proteins Fresh or frozen whole meats, poultry (not injected with sodium), and fish with no sauce added. Unsalted nuts. Dried peas, beans, and   lentils without added salt. Unsalted canned beans. Eggs. Unsalted nut butters. Low-sodium canned  tunaor chicken. Dairy Milk. Soy milk. Yogurt. Low-sodium cheeses, such as Swiss, Monterey Jack, mozzarella, and ricotta. Sherbet or ice cream (keep to  cup per serving).Cream cheese. Fats and oils Unsalted butter or margarine. Other foods Homemade pudding. Sodium-free baking soda and baking powder. Herbs and spices.Low-sodium seasoning mixes. Beverages Coffee and tea. Carbonated beverages. The items listed above may not be a complete list of foods low in sodium. Actual amounts of sodium may be different depending on processing. Contact a dietitian for more information. What are some salt alternatives when cooking? The following are herbs, seasonings, and spices that can be used instead of salt to flavor your food. Herbs should be fresh or dried. Do not choose packaged mixes. Next to the name of the herb, spice, or seasoning aresome examples of foods you can pair it with. Herbs [place] leaves - Soups, meat and vegetable dishes, and spaghetti sauce. Basil - Italian dishes, soups, pasta, and fish dishes. Cilantro - Meat, poultry, and vegetable dishes. Chili powder - Marinades and Mexican dishes. Chives - Salad dressings and potato dishes. Cumin - Mexican dishes, couscous, and meat dishes. Dill - Fish dishes, sauces, and salads. Fennel - Meat and vegetable dishes, breads, and cookies. Garlic (do not use garlic salt) - Italian dishes, meat dishes, salad dressings, and sauces. Marjoram - Soups, potato dishes, and meat dishes. Oregano - Pizza and spaghetti sauce. Parsley - Salads, soups, pasta, and meat dishes. Rosemary - Italian dishes, salad dressings, soups, and red meats. Saffron - Fish dishes, pasta, and some poultry dishes. Sage - Stuffings and sauces. Tarragon - Fish and poultry dishes. Thyme - Stuffing, meat, and fish dishes. Seasonings Lemon juice - Fish dishes, poultry dishes, vegetables, and salads. Vinegar - Salad dressings, vegetables, and fish dishes. Spices Cinnamon - Sweet  dishes, such as cakes, cookies, and puddings. Cloves - Gingerbread, puddings, and marinades for meats. Curry - Vegetable dishes, fish and poultry dishes, and stir-fry dishes. Ginger - Vegetable dishes, fish dishes, and stir-fry dishes. Nutmeg - Pasta, vegetables, poultry, fish dishes, and custard. Summary Cooking with less salt is one way to reduce the amount of sodium that you get from food. Buy sodium-free or low-sodium products. Check the food label before using or buying packaged ingredients. Use herbs, seasonings without salt, and spices as substitutes for salt in foods. This information is not intended to replace advice given to you by your health care provider. Make sure you discuss any questions you have with your healthcare provider. Document Revised: 01/06/2019 Document Reviewed: 01/06/2019 Elsevier Patient Education  2022 [employer]  

## 2021-02-06 NOTE — Progress Notes (Signed)
The patient is here for a blood pressure medication.  The patient states she has taken her medication.  The patient was notified that Dr.  Allyne Gee said to continue with her current medications.

## 2021-02-09 ENCOUNTER — Telehealth: Payer: Self-pay

## 2021-02-09 NOTE — Telephone Encounter (Signed)
error 

## 2021-02-13 ENCOUNTER — Encounter: Payer: Self-pay | Admitting: Internal Medicine

## 2021-04-06 LAB — HM MAMMOGRAPHY

## 2021-04-11 ENCOUNTER — Encounter: Payer: Self-pay | Admitting: Internal Medicine

## 2021-05-02 ENCOUNTER — Encounter: Payer: Self-pay | Admitting: Internal Medicine

## 2021-05-02 ENCOUNTER — Ambulatory Visit (INDEPENDENT_AMBULATORY_CARE_PROVIDER_SITE_OTHER): Admitting: Internal Medicine

## 2021-05-02 VITALS — BP 122/78 | HR 84 | Temp 98.2°F | Ht 61.2 in | Wt 260.2 lb

## 2021-05-02 DIAGNOSIS — Z0000 Encounter for general adult medical examination without abnormal findings: Secondary | ICD-10-CM

## 2021-05-02 DIAGNOSIS — Z23 Encounter for immunization: Secondary | ICD-10-CM | POA: Diagnosis not present

## 2021-05-02 DIAGNOSIS — I10 Essential (primary) hypertension: Secondary | ICD-10-CM | POA: Diagnosis not present

## 2021-05-02 DIAGNOSIS — E7800 Pure hypercholesterolemia, unspecified: Secondary | ICD-10-CM

## 2021-05-02 DIAGNOSIS — E1165 Type 2 diabetes mellitus with hyperglycemia: Secondary | ICD-10-CM

## 2021-05-02 DIAGNOSIS — Z6841 Body Mass Index (BMI) 40.0 and over, adult: Secondary | ICD-10-CM

## 2021-05-02 DIAGNOSIS — E66813 Obesity, class 3: Secondary | ICD-10-CM

## 2021-05-02 DIAGNOSIS — R0683 Snoring: Secondary | ICD-10-CM | POA: Diagnosis not present

## 2021-05-02 LAB — POCT URINALYSIS DIPSTICK
Bilirubin, UA: NEGATIVE
Glucose, UA: NEGATIVE
Ketones, UA: NEGATIVE
Leukocytes, UA: NEGATIVE
Nitrite, UA: NEGATIVE
Protein, UA: POSITIVE — AB
Spec Grav, UA: >=1.03 — AB (ref 1.010–1.025)
Urobilinogen, UA: 0.2 E.U./dL
pH, UA: 5.5 (ref 5.0–8.0)

## 2021-05-02 NOTE — Progress Notes (Signed)
Jeri CosI,Jameka J Llittleton,acting as a Neurosurgeonscribe for Gwynneth Alimentobyn N Cheyenne Schumm, MD.,have documented all relevant documentation on the behalf of Gwynneth Alimentobyn N Thayne Cindric, MD,as directed by  Gwynneth Alimentobyn N Nabor Thomann, MD while in the presence of Gwynneth Alimentobyn N Dana Debo, MD.  ?This visit occurred during the SARS-CoV-2 public health emergency.  Safety protocols were in place, including screening questions prior to the visit, additional usage of staff PPE, and extensive cleaning of exam room while observing appropriate contact time as indicated for disinfecting solutions. ? ?Subjective:  ?  ? Patient ID: Marisa [name] , female    DOB: [birth year] , 61 y.o.   MRN: [id number] ? ? ?Chief Complaint  ?Patient presents with  ? Annual Exam  ? Diabetes  ? Hypertension  ? ? ?HPI ? ?She is here today for a full physical examination. She is no longer followed by GYN. She has had a hysterectomy. Patient reports she has a diabetic eye exam scheduled in June. She reports compliance with meds. She denies headaches, chest pain and shortness of breath. She admits she is not exercising as she should, but she has been more active.  ? ?Diabetes ?She presents for her follow-up diabetic visit. She has type 2 diabetes mellitus. Her disease course has been stable. There are no hypoglycemic associated symptoms. Pertinent negatives for hypoglycemia include no confusion, dizziness or headaches. Pertinent negatives for diabetes include no chest pain, no polydipsia, no polyphagia and no weakness. There are no hypoglycemic complications. Risk factors for coronary artery disease include diabetes mellitus, hypertension, sedentary lifestyle, post-menopausal, dyslipidemia and obesity. She does not see a podiatrist. ?Hypertension ?This is a chronic problem. The current episode started more than 1 year ago. The problem has been gradually improving since onset. The problem is uncontrolled. Pertinent negatives include no chest pain, headaches, palpitations or shortness of breath. Compliance  problems include exercise.    ? ?Past Medical History:  ?Diagnosis Date  ? Borderline diabetes   ? Hypertension   ?  ? ?Family History  ?Problem Relation Age of Onset  ? Diabetes Mother   ? Hypertension Mother   ? ? ? ?Current Outpatient Medications:  ?  albuterol (PROVENTIL) (2.5 MG/3ML) 0.083% nebulizer solution, Take 3 mLs (2.5 mg total) by nebulization every 4 (four) hours as needed for wheezing or shortness of breath (for neb machine ordered today.)., Disp: 75 mL, Rfl: 1 ?  albuterol (VENTOLIN HFA) 108 (90 Base) MCG/ACT inhaler, Inhale 2 puffs into the lungs every 6 (six) hours as needed for wheezing or shortness of breath (and cough)., Disp: 8 g, Rfl: 3 ?  fluticasone (FLOVENT DISKUS) 50 MCG/BLIST diskus inhaler, Inhale into the lungs., Disp: , Rfl:  ?  linaclotide (LINZESS) 145 MCG CAPS capsule, TK 1 C PO  BEFORE FIRST MEAL OF THE DAY., Disp: 90 capsule, Rfl: 1 ?  Olmesartan-amLODIPine-HCTZ 40-10-25 MG TABS, Take 1 tablet by mouth daily., Disp: 90 tablet, Rfl: 1 ?  pravastatin (PRAVACHOL) 80 MG tablet, Take one tablet by mouth daily Monday through Friday. Skip weekends., Disp: 90 tablet, Rfl: 1 ?  Semaglutide, 1 MG/DOSE, (OZEMPIC, 1 MG/DOSE,) 2 MG/1.5ML SOPN, Inject 1 mg into the skin once a week., Disp: 4.5 mL, Rfl: 1 ?  Vitamin D, Ergocalciferol, (DRISDOL) 1.25 MG (50000 UNIT) CAPS capsule, Take 1 capsule (50,000 Units total) by mouth every 7 (seven) days., Disp: 12 capsule, Rfl: 0 ?  DM-APAP-CPM (CORICIDIN HBP FLU PO), Take by mouth. (Patient not taking: Reported on 08/30/2020), Disp: , Rfl:  ?  folic acid (FOLVITE) 1  MG tablet, One tablet po daily (Patient not taking: Reported on 05/02/2021), Disp: 90 tablet, Rfl: 1  ? ?No Known Allergies  ? ? ?The patient states she uses post menopausal status for birth control. Last LMP was No LMP recorded. Patient has had a hysterectomy.. Negative for Dysmenorrhea. Negative for: breast discharge, breast lump(s), breast pain and breast self exam. Associated symptoms include  abnormal vaginal bleeding. Pertinent negatives include abnormal bleeding (hematology), anxiety, decreased libido, depression, difficulty falling sleep, dyspareunia, history of infertility, nocturia, sexual dysfunction, sleep disturbances, urinary incontinence, urinary urgency, vaginal discharge and vaginal itching. Diet regular.The patient states her exercise level is intermittent.   ? . The patient's tobacco use is:  ?Social History  ? ?Tobacco Use  ?Smoking Status Never  ?Smokeless Tobacco Never  ?Marland Kitchen She has been exposed to passive smoke. The patient's alcohol use is:  ?Social History  ? ?Substance and Sexual Activity  ?Alcohol Use No  ? ? ?Review of Systems  ?Constitutional: Negative.   ?HENT: Negative.    ?Eyes: Negative.   ?Respiratory: Negative.  Negative for shortness of breath.   ?Cardiovascular: Negative.  Negative for chest pain and palpitations.  ?Gastrointestinal: Negative.   ?Endocrine: Negative.  Negative for polydipsia and polyphagia.  ?Genitourinary: Negative.   ?Musculoskeletal: Negative.   ?Skin: Negative.   ?Allergic/Immunologic: Negative.   ?Neurological: Negative.  Negative for dizziness, weakness and headaches.  ?Hematological: Negative.   ?Psychiatric/Behavioral: Negative.  Negative for confusion.    ? ?Today's Vitals  ? 05/02/21 7026  ?BP: 122/78  ?Pulse: 84  ?Temp: 98.2 ?F (36.8 ?C)  ?Weight: 260 lb 3.2 oz (118 kg)  ?Height: 5' 1.2" (1.554 m)  ?PainSc: 0-No pain  ? ?Body mass index is 48.84 kg/m?.  ?Wt Readings from Last 3 Encounters:  ?05/02/21 260 lb 3.2 oz (118 kg)  ?01/16/21 259 lb (117.5 kg)  ?08/30/20 261 lb (118.4 kg)  ?  ? ?Objective:  ?Physical Exam ?Vitals and nursing note reviewed.  ?Constitutional:   ?   Appearance: Normal appearance.  ?HENT:  ?   Head: Normocephalic and atraumatic.  ?   Right Ear: Tympanic membrane, ear canal and external ear normal.  ?   Left Ear: Tympanic membrane, ear canal and external ear normal.  ?   Nose:  ?   Comments: Masked  ?   Mouth/Throat:  ?    Comments: Masked  ?Eyes:  ?   Extraocular Movements: Extraocular movements intact.  ?   Conjunctiva/sclera: Conjunctivae normal.  ?   Pupils: Pupils are equal, round, and reactive to light.  ?Cardiovascular:  ?   Rate and Rhythm: Normal rate and regular rhythm.  ?   Pulses: Normal pulses.     ?     Dorsalis pedis pulses are 2+ on the right side and 2+ on the left side.  ?   Heart sounds: Normal heart sounds.  ?Pulmonary:  ?   Effort: Pulmonary effort is normal.  ?   Breath sounds: Normal breath sounds.  ?Chest:  ?Breasts: ?   Tanner Score is 5.  ?   Right: Normal.  ?   Left: Normal.  ?Abdominal:  ?   General: Bowel sounds are normal.  ?   Palpations: Abdomen is soft.  ?   Comments: Obese, soft, difficult to assess organomegaly due to body habitus  ?Genitourinary: ?   Comments: deferred ?Musculoskeletal:     ?   General: Normal range of motion.  ?   Cervical back: Normal range of motion  and neck supple.  ?Feet:  ?   Right foot:  ?   Protective Sensation: 5 sites tested.  5 sites sensed.  ?   Skin integrity: Skin integrity normal.  ?   Toenail Condition: Right toenails are normal.  ?   Left foot:  ?   Protective Sensation: 5 sites tested.  5 sites sensed.  ?   Skin integrity: Skin integrity normal.  ?   Toenail Condition: Left toenails are normal.  ?Skin: ?   General: Skin is warm and dry.  ?Neurological:  ?   General: No focal deficit present.  ?   Mental Status: She is alert and oriented to person, place, and time.  ?   Sensory: No sensory deficit.  ?Psychiatric:     ?   Mood and Affect: Mood normal.     ?   Behavior: Behavior normal.  ?   ?Assessment And Plan:  ?   ?1. Encounter for general adult medical examination w/o abnormal findings ?Comments: A full exam was performed. Importance of monthly self breast exams was discussed with the patient. PATIENT IS ADVISED TO GET 30-45 MINUTES REGULAR EXERCISE NO LESS THAN FOUR TO FIVE DAYS PER WEEK - BOTH WEIGHTBEARING EXERCISES AND AEROBIC ARE RECOMMENDED.  PATIENT IS  ADVISED TO FOLLOW A HEALTHY DIET WITH AT LEAST SIX FRUITS/VEGGIES PER DAY, DECREASE INTAKE OF RED MEAT, AND TO INCREASE FISH INTAKE TO TWO DAYS PER WEEK.  MEATS/FISH SHOULD NOT BE FRIED, BAKED OR BROILED IS PREF

## 2021-05-02 NOTE — Patient Instructions (Signed)

## 2021-05-05 LAB — CMP14+EGFR
ALT: 11 IU/L (ref 0–32)
AST: 16 IU/L (ref 0–40)
Albumin/Globulin Ratio: 1.3 (ref 1.2–2.2)
Albumin: 3.9 g/dL (ref 3.8–4.8)
Alkaline Phosphatase: 103 IU/L (ref 44–121)
BUN/Creatinine Ratio: 13 (ref 12–28)
BUN: 10 mg/dL (ref 8–27)
Bilirubin Total: 0.4 mg/dL (ref 0.0–1.2)
CO2: 25 mmol/L (ref 20–29)
Calcium: 9.1 mg/dL (ref 8.7–10.3)
Chloride: 103 mmol/L (ref 96–106)
Creatinine, Ser: 0.79 mg/dL (ref 0.57–1.00)
Globulin, Total: 3.1 g/dL (ref 1.5–4.5)
Glucose: 188 mg/dL — ABNORMAL HIGH (ref 70–99)
Potassium: 3.9 mmol/L (ref 3.5–5.2)
Sodium: 141 mmol/L (ref 134–144)
Total Protein: 7 g/dL (ref 6.0–8.5)
eGFR: 85 mL/min/{1.73_m2} (ref >59)

## 2021-05-05 LAB — LIPID PANEL
Chol/HDL Ratio: 4.2 ratio (ref 0.0–4.4)
Cholesterol, Total: 180 mg/dL (ref 100–199)
HDL: 43 mg/dL (ref >39)
LDL Chol Calc (NIH): 114 mg/dL — ABNORMAL HIGH (ref 0–99)
Triglycerides: 129 mg/dL (ref 0–149)
VLDL Cholesterol Cal: 23 mg/dL (ref 5–40)

## 2021-05-05 LAB — CBC
Hematocrit: 40.9 % (ref 34.0–46.6)
Hemoglobin: 12.7 g/dL (ref 11.1–15.9)
MCH: 22.1 pg — ABNORMAL LOW (ref 26.6–33.0)
MCHC: 31.1 g/dL — ABNORMAL LOW (ref 31.5–35.7)
MCV: 71 fL — ABNORMAL LOW (ref 79–97)
Platelets: 268 10*3/uL (ref 150–450)
RBC: 5.75 x10E6/uL — ABNORMAL HIGH (ref 3.77–5.28)
RDW: 16.9 % — ABNORMAL HIGH (ref 11.7–15.4)
WBC: 6.3 10*3/uL (ref 3.4–10.8)

## 2021-05-05 LAB — HEMOGLOBIN A1C
Est. average glucose Bld gHb Est-mCnc: 183 mg/dL
Hgb A1c MFr Bld: 8 % — ABNORMAL HIGH (ref 4.8–5.6)

## 2021-05-05 LAB — MICROALBUMIN / CREATININE URINE RATIO
Creatinine, Urine: 201.9 mg/dL
Microalb/Creat Ratio: 257 mg/g creat — ABNORMAL HIGH (ref 0–29)
Microalbumin, Urine: 519.2 ug/mL

## 2021-05-16 ENCOUNTER — Encounter: Payer: Self-pay | Admitting: Internal Medicine

## 2021-05-16 ENCOUNTER — Other Ambulatory Visit: Payer: Self-pay

## 2021-05-16 DIAGNOSIS — K5909 Other constipation: Secondary | ICD-10-CM

## 2021-05-16 DIAGNOSIS — E559 Vitamin D deficiency, unspecified: Secondary | ICD-10-CM

## 2021-05-16 DIAGNOSIS — E1165 Type 2 diabetes mellitus with hyperglycemia: Secondary | ICD-10-CM

## 2021-05-16 DIAGNOSIS — I10 Essential (primary) hypertension: Secondary | ICD-10-CM

## 2021-05-16 MED ORDER — OLMESARTAN-AMLODIPINE-HCTZ 40-10-25 MG PO TABS: 1.0000 | ORAL_TABLET | Freq: Every day | ORAL | 1 refills | Status: DC

## 2021-05-16 MED ORDER — OZEMPIC (1 MG/DOSE) 2 MG/1.5ML ~~LOC~~ SOPN: 1.0000 mg | PEN_INJECTOR | SUBCUTANEOUS | 1 refills | Status: DC

## 2021-05-16 MED ORDER — XIGDUO XR 10-1000 MG PO TB24: ORAL_TABLET | ORAL | 0 refills | Status: DC

## 2021-05-16 MED ORDER — LINACLOTIDE 145 MCG PO CAPS: ORAL_CAPSULE | ORAL | 1 refills | Status: DC

## 2021-05-16 MED ORDER — VITAMIN D (ERGOCALCIFEROL) 1.25 MG (50000 UNIT) PO CAPS: 50000.0000 [IU] | ORAL_CAPSULE | ORAL | 0 refills | Status: DC

## 2021-05-16 MED ORDER — ATORVASTATIN CALCIUM 40 MG PO TABS: ORAL_TABLET | ORAL | 1 refills | Status: DC

## 2021-06-14 ENCOUNTER — Encounter: Payer: Self-pay | Admitting: Internal Medicine

## 2021-06-14 ENCOUNTER — Ambulatory Visit (INDEPENDENT_AMBULATORY_CARE_PROVIDER_SITE_OTHER): Admitting: Internal Medicine

## 2021-06-14 VITALS — BP 132/90 | HR 84 | Temp 98.3°F | Ht 61.2 in | Wt 261.0 lb

## 2021-06-14 DIAGNOSIS — I10 Essential (primary) hypertension: Secondary | ICD-10-CM | POA: Diagnosis not present

## 2021-06-14 DIAGNOSIS — E7800 Pure hypercholesterolemia, unspecified: Secondary | ICD-10-CM | POA: Diagnosis not present

## 2021-06-14 DIAGNOSIS — Z6841 Body Mass Index (BMI) 40.0 and over, adult: Secondary | ICD-10-CM

## 2021-06-14 DIAGNOSIS — E1165 Type 2 diabetes mellitus with hyperglycemia: Secondary | ICD-10-CM

## 2021-06-14 DIAGNOSIS — Z638 Other specified problems related to primary support group: Secondary | ICD-10-CM

## 2021-06-14 NOTE — Patient Instructions (Signed)

## 2021-06-14 NOTE — Progress Notes (Signed)
Rich Brave Llittleton,acting as a Education administrator for Maximino Greenland, MD.,have documented all relevant documentation on the behalf of Maximino Greenland, MD,as directed by  Maximino Greenland, MD while in the presence of Maximino Greenland, MD.  This visit occurred during the SARS-CoV-2 public health emergency.  Safety protocols were in place, including screening questions prior to the visit, additional usage of staff PPE, and extensive cleaning of exam room while observing appropriate contact time as indicated for disinfecting solutions.  Subjective:     Patient ID: Marisa [name] , female    DOB: Dec 23, 1960 , 61 y.o.   MRN: [id number]   Chief Complaint  Patient presents with   Hyperlipidemia   Diabetes    HPI  She is here today for diabetes and chol f/u.  She was started on Xigduo for her diabetes. She has not had any issues with the medication. Additionally, she was switched to atorvastatin for better lipid control.   She reports compliance with meds. Patient would like to see if she has a fatty liver.   Hyperlipidemia This is a chronic problem. The current episode started more than 1 year ago. The problem is uncontrolled. Exacerbating diseases include diabetes and obesity. Factors aggravating her hyperlipidemia include fatty foods. Pertinent negatives include no chest pain or leg pain. Current antihyperlipidemic treatment includes statins.  Diabetes She presents for her follow-up diabetic visit. She has type 2 diabetes mellitus. Her disease course has been stable. There are no hypoglycemic associated symptoms. Pertinent negatives for diabetes include no blurred vision and no chest pain. There are no hypoglycemic complications. Risk factors for coronary artery disease include diabetes mellitus, hypertension, sedentary lifestyle, post-menopausal, dyslipidemia and obesity. She is following a diabetic diet. She does not see a podiatrist. Hypertension This is a chronic problem. The current episode  started more than 1 year ago. The problem has been gradually improving since onset. The problem is uncontrolled. Pertinent negatives include no blurred vision or chest pain. Risk factors for coronary artery disease include diabetes mellitus, dyslipidemia, obesity, post-menopausal state and sedentary lifestyle. The current treatment provides moderate improvement. Compliance problems include exercise.     Past Medical History:  Diagnosis Date   Borderline diabetes    Hypertension      Family History  Problem Relation Age of Onset   Diabetes Mother    Hypertension Mother      Current Outpatient Medications:    albuterol (PROVENTIL) (2.5 MG/3ML) 0.083% nebulizer solution, Take 3 mLs (2.5 mg total) by nebulization every 4 (four) hours as needed for wheezing or shortness of breath (for neb machine ordered today.)., Disp: 75 mL, Rfl: 1   albuterol (VENTOLIN HFA) 108 (90 Base) MCG/ACT inhaler, Inhale 2 puffs into the lungs every 6 (six) hours as needed for wheezing or shortness of breath (and cough)., Disp: 8 g, Rfl: 3   atorvastatin (LIPITOR) 40 MG tablet, Take one tablet by mouth everyday except sundays, Disp: 90 tablet, Rfl: 1   Dapagliflozin-metFORMIN HCl ER (XIGDUO XR) 10-998 MG TB24, Take one tablet by mouth daily, Disp: 30 tablet, Rfl: 0   fluticasone (FLOVENT DISKUS) 50 MCG/BLIST diskus inhaler, Inhale into the lungs., Disp: , Rfl:    folic acid (FOLVITE) 1 MG tablet, One tablet po daily, Disp: 90 tablet, Rfl: 1   linaclotide (LINZESS) 145 MCG CAPS capsule, TK 1 C PO  BEFORE FIRST MEAL OF THE DAY., Disp: 90 capsule, Rfl: 1   Olmesartan-amLODIPine-HCTZ 40-10-25 MG TABS, Take 1 tablet by mouth daily., Disp:  90 tablet, Rfl: 1   Semaglutide, 1 MG/DOSE, (OZEMPIC, 1 MG/DOSE,) 2 MG/1.5ML SOPN, Inject 1 mg into the skin once a week., Disp: 4.5 mL, Rfl: 1   Vitamin D, Ergocalciferol, (DRISDOL) 1.25 MG (50000 UNIT) CAPS capsule, Take 1 capsule (50,000 Units total) by mouth every 7 (seven) days.,  Disp: 12 capsule, Rfl: 0   DM-APAP-CPM (CORICIDIN HBP FLU PO), Take by mouth. (Patient not taking: Reported on 08/30/2020), Disp: , Rfl:    No Known Allergies   Review of Systems  Constitutional: Negative.   Eyes:  Negative for blurred vision.  Respiratory: Negative.    Cardiovascular: Negative.  Negative for chest pain.  Gastrointestinal: Negative.   Neurological: Negative.   Psychiatric/Behavioral: Negative.      Today's Vitals   06/14/21 1415  BP: 132/90  Pulse: 84  Temp: 98.3 F (36.8 C)  Weight: 261 lb (118.4 kg)  Height: 5' 1.2" (1.554 m)   Body mass index is 48.99 kg/m.  Wt Readings from Last 3 Encounters:  06/14/21 261 lb (118.4 kg)  05/02/21 260 lb 3.2 oz (118 kg)  01/16/21 259 lb (117.5 kg)    BP Readings from Last 3 Encounters:  06/14/21 132/90  05/02/21 122/78  02/06/21 136/84   Objective:  Physical Exam Vitals and nursing note reviewed.  Constitutional:      Appearance: Normal appearance. She is obese.  HENT:     Head: Normocephalic and atraumatic.  Eyes:     Extraocular Movements: Extraocular movements intact.  Cardiovascular:     Rate and Rhythm: Normal rate and regular rhythm.     Heart sounds: Normal heart sounds.  Pulmonary:     Effort: Pulmonary effort is normal.     Breath sounds: Normal breath sounds.  Musculoskeletal:     Cervical back: Normal range of motion.  Skin:    General: Skin is warm.  Neurological:     General: No focal deficit present.     Mental Status: She is alert.  Psychiatric:        Mood and Affect: Mood normal.        Behavior: Behavior normal.      Assessment And Plan     1. Pure hypercholesterolemia Comments: Chronic. She is recently started on atorvastatin. Goal LDL<70. I will check non-fasting lipid panel and ALT today. - Lipid panel - ALT  2. Uncontrolled type 2 diabetes mellitus with hyperglycemia ([place]) Comments: She will c/w Xigduo and Ozempic. I will check renal function today.  - BMP8+EGFR  3.  Essential hypertension, benign Comments: Uncontrolled. I will send Toprol XL $RemoveB'25mg'BgSbcQAi$  1/2 tab to take nightly. She will f/u in 4 weeks for re-evaluation.   4. Class 3 severe obesity due to excess calories with serious comorbidity and body mass index (BMI) of 45.0 to 49.9 in adult [facility]) Comments: She is encouraged to lose ten percent of her body weight to decrease cardiac risk. Advised to aim for at least 150 minutes of exercise per week.   5. Stress due to family tension Comments: I will refer her for counseling.  - Ambulatory referral to Psychology    Patient was given opportunity to ask questions. Patient verbalized understanding of the plan and was able to repeat key elements of the plan. All questions were answered to their satisfaction.   I, Maximino Greenland, MD, have reviewed all documentation for this visit. The documentation on 06/14/21 for the exam, diagnosis, procedures, and orders are all accurate and complete.   IF YOU HAVE  BEEN REFERRED TO A SPECIALIST, IT MAY TAKE 1-2 WEEKS TO SCHEDULE/PROCESS THE REFERRAL. IF YOU HAVE NOT HEARD FROM US/SPECIALIST IN TWO WEEKS, PLEASE GIVE [place] A CALL AT [phone].   THE PATIENT IS ENCOURAGED TO PRACTICE SOCIAL DISTANCING DUE TO THE COVID-19 PANDEMIC.

## 2021-06-15 LAB — BMP8+EGFR
BUN/Creatinine Ratio: 12 (ref 12–28)
BUN: 11 mg/dL (ref 8–27)
CO2: 24 mmol/L (ref 20–29)
Calcium: 9.1 mg/dL (ref 8.7–10.3)
Chloride: 103 mmol/L (ref 96–106)
Creatinine, Ser: 0.89 mg/dL (ref 0.57–1.00)
Glucose: 125 mg/dL — ABNORMAL HIGH (ref 70–99)
Potassium: 3.9 mmol/L (ref 3.5–5.2)
Sodium: 142 mmol/L (ref 134–144)
eGFR: 74 mL/min/{1.73_m2} (ref >59)

## 2021-06-15 LAB — LIPID PANEL
Chol/HDL Ratio: 4.6 ratio — ABNORMAL HIGH (ref 0.0–4.4)
Cholesterol, Total: 190 mg/dL (ref 100–199)
HDL: 41 mg/dL (ref >39)
LDL Chol Calc (NIH): 120 mg/dL — ABNORMAL HIGH (ref 0–99)
Triglycerides: 164 mg/dL — ABNORMAL HIGH (ref 0–149)
VLDL Cholesterol Cal: 29 mg/dL (ref 5–40)

## 2021-06-15 LAB — ALT: ALT: 12 IU/L (ref 0–32)

## 2021-07-03 ENCOUNTER — Ambulatory Visit

## 2021-07-03 VITALS — BP 130/80 | HR 84 | Temp 98.3°F | Ht 61.2 in | Wt 261.0 lb

## 2021-07-03 DIAGNOSIS — I10 Essential (primary) hypertension: Secondary | ICD-10-CM

## 2021-07-03 NOTE — Progress Notes (Signed)
Patient presents today for a bp check. She is currently taking olmesartan-amlodipine-hctz 40-10-25. Consulted with provider she advised pt to continue with her current meds and we will f/u at her next visit. YL,RMA

## 2021-07-05 LAB — HM DIABETES EYE EXAM

## 2021-07-25 ENCOUNTER — Institutional Professional Consult (permissible substitution): Admitting: Neurology

## 2021-08-19 IMAGING — DX DG CHEST 2V
2 series · 2 of 2 positions shown · non-contrast
Comparison: 10/07/2019

CLINICAL DATA: 59-year-old female with cough

EXAM:
CHEST - 2 VIEW

[chest pa]
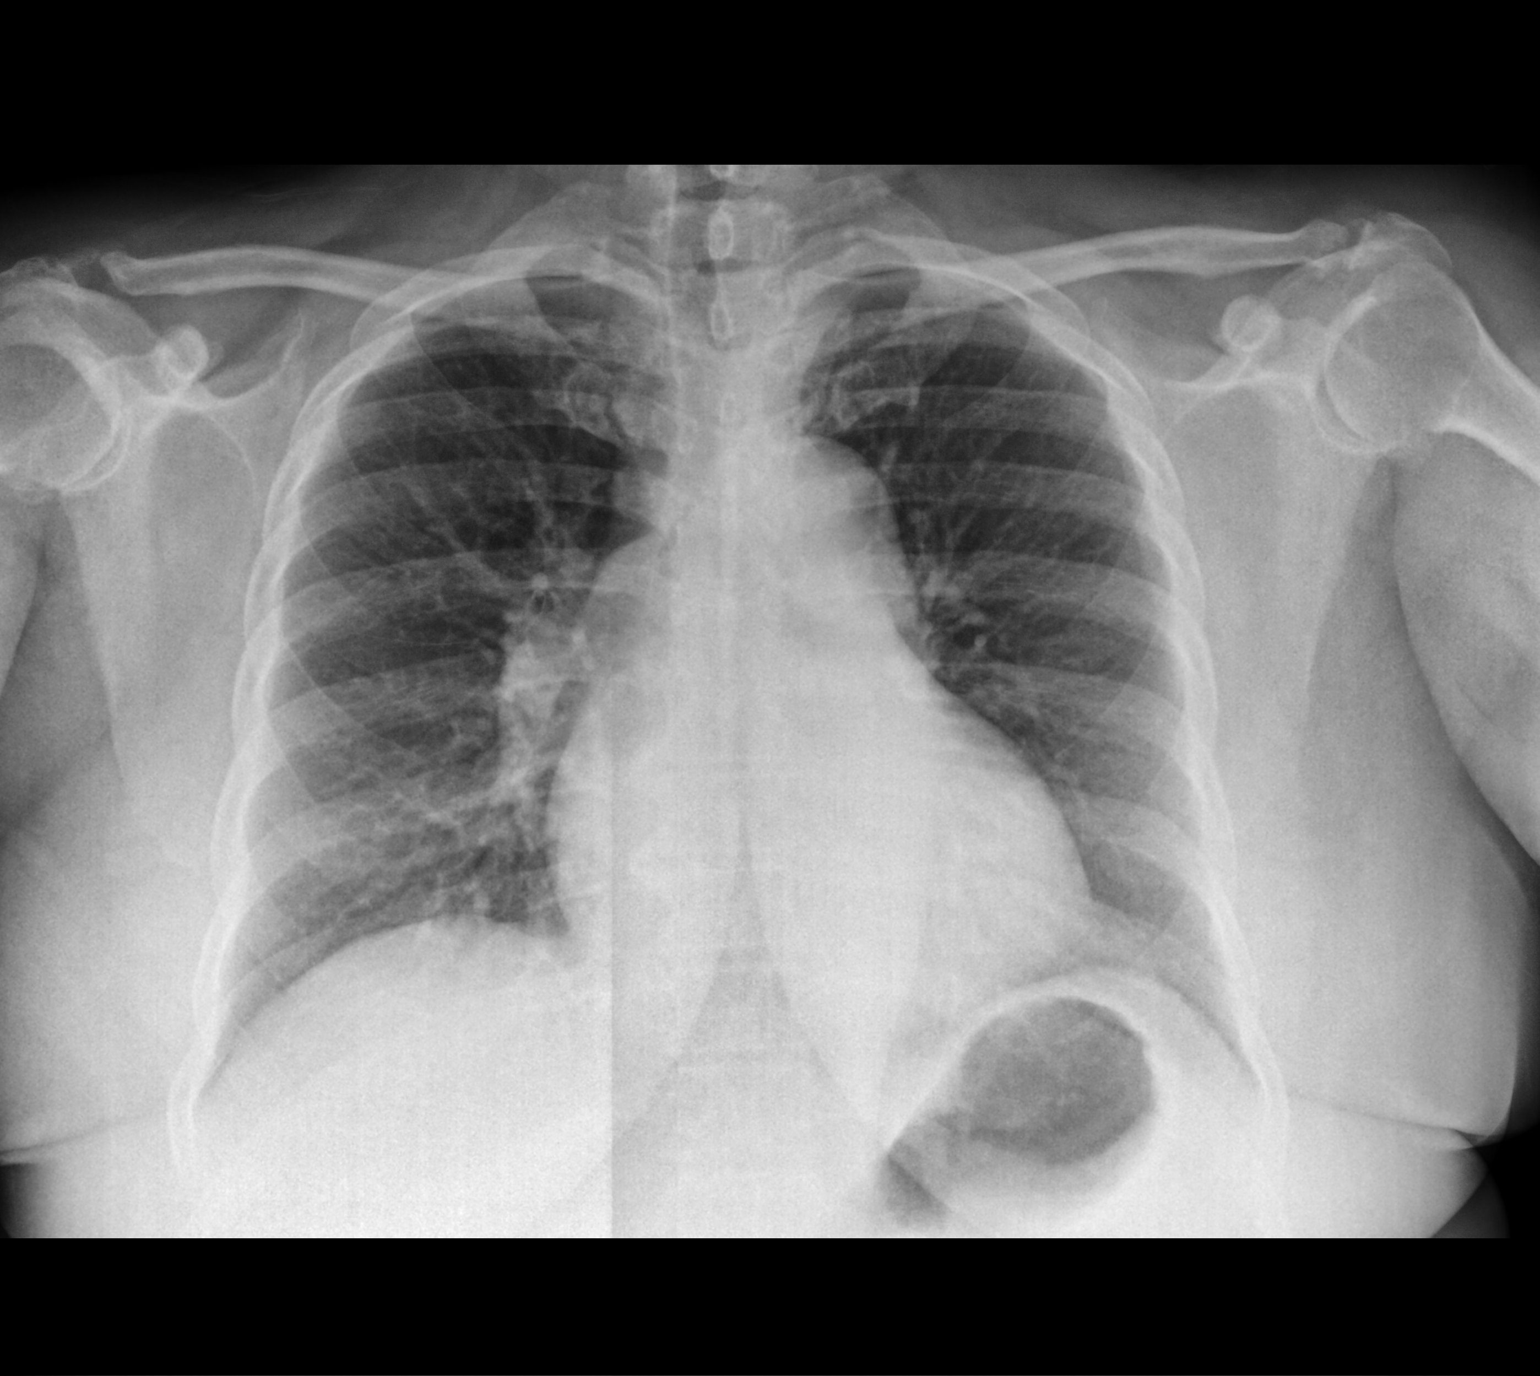

[chest lat]
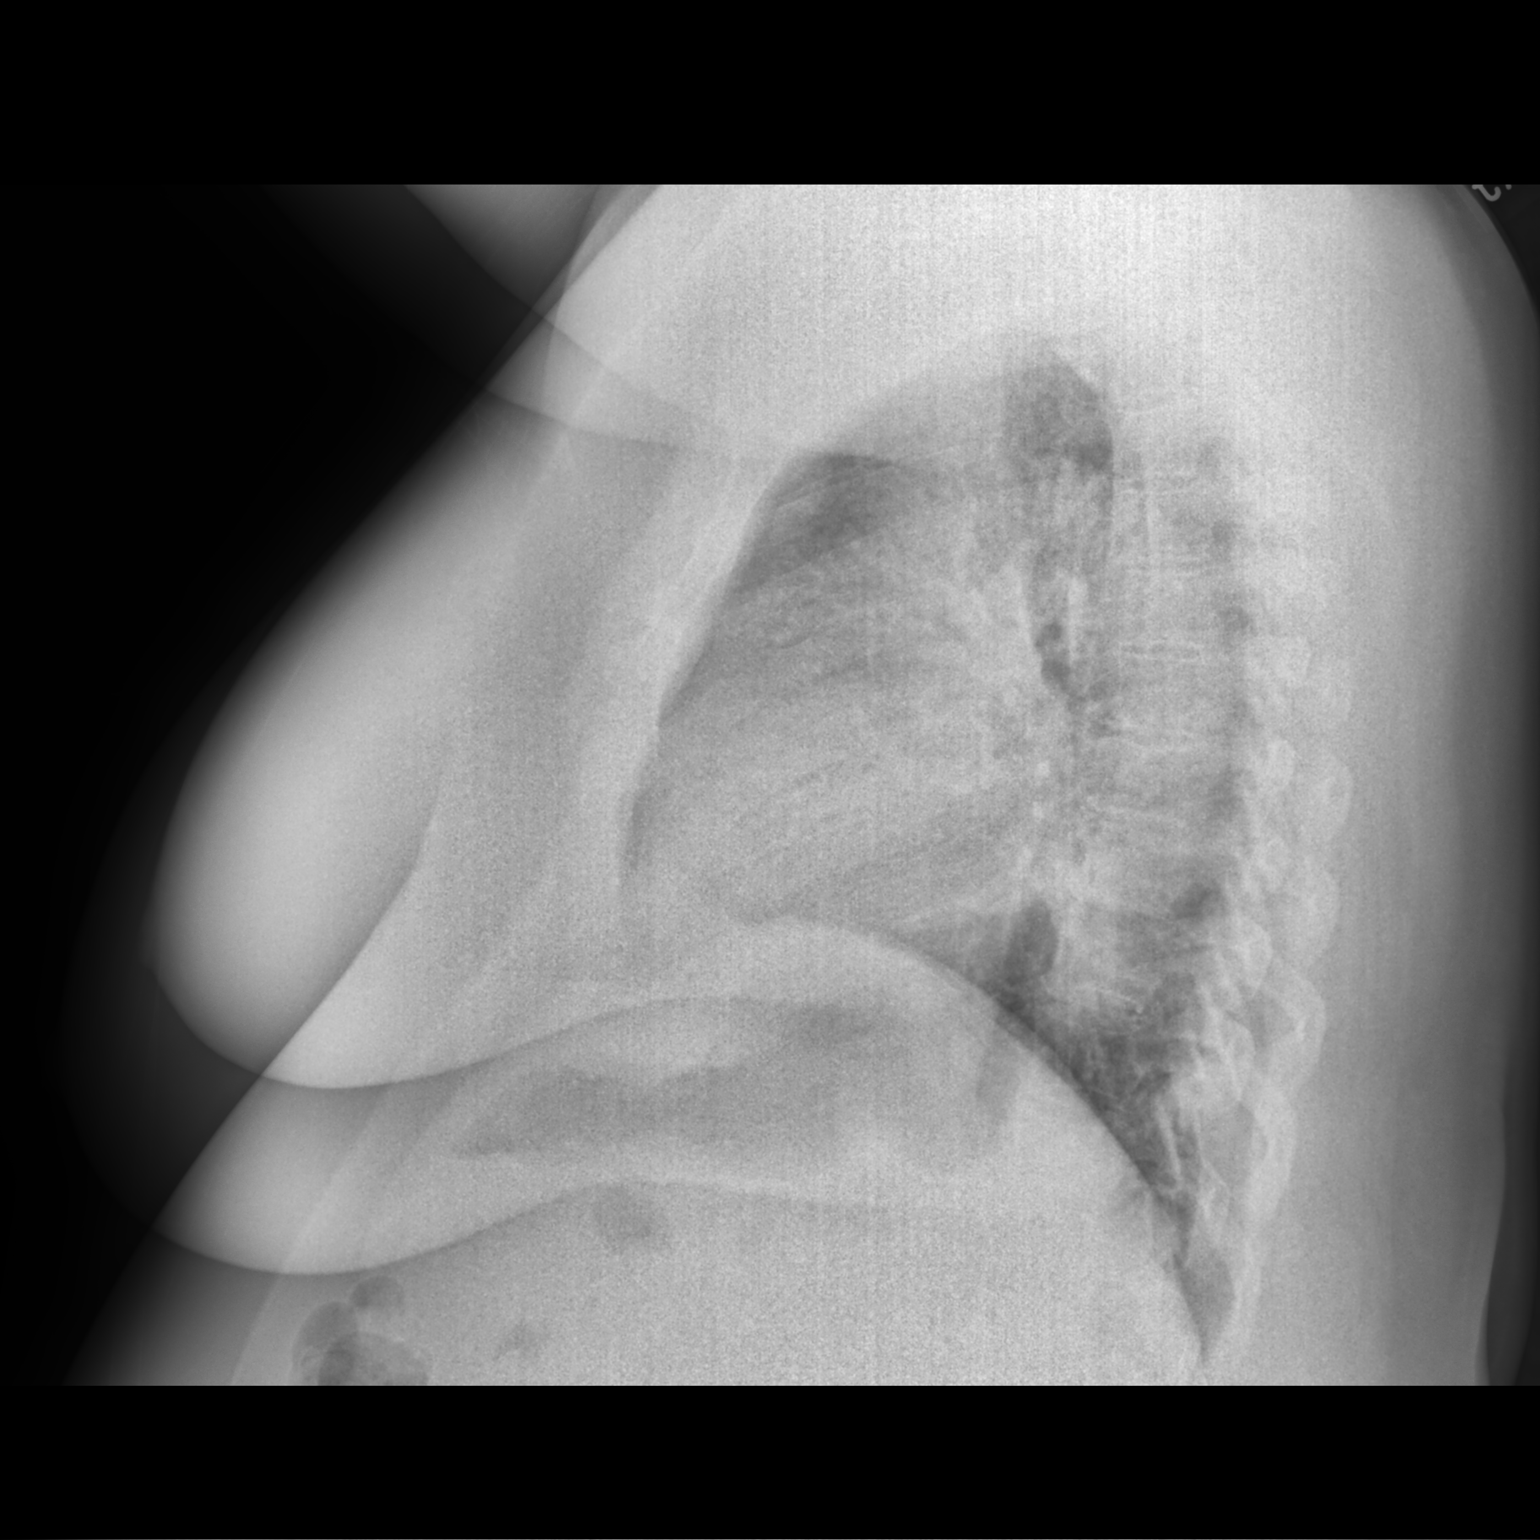

[2 of 2 positions shown; findings below may reference images not displayed]

FINDINGS: Cardiomediastinal silhouette unchanged in size and contour. No
evidence of central vascular congestion. No interlobular septal
thickening. No pneumothorax. No pleural effusion.
IMPRESSION: Negative for acute cardiopulmonary disease

## 2021-09-05 ENCOUNTER — Institutional Professional Consult (permissible substitution): Admitting: Neurology

## 2021-09-05 ENCOUNTER — Telehealth: Payer: Self-pay | Admitting: Neurology

## 2021-09-05 NOTE — Telephone Encounter (Signed)
LVM and sent mychart msg informing pt of cancellation of today's appt- MD out.

## 2021-09-25 ENCOUNTER — Encounter: Payer: Self-pay | Admitting: Internal Medicine

## 2021-09-25 ENCOUNTER — Ambulatory Visit (INDEPENDENT_AMBULATORY_CARE_PROVIDER_SITE_OTHER): Admitting: Internal Medicine

## 2021-09-25 VITALS — BP 146/88 | HR 88 | Temp 98.1°F | Ht 61.2 in | Wt 259.0 lb

## 2021-09-25 DIAGNOSIS — Z23 Encounter for immunization: Secondary | ICD-10-CM | POA: Diagnosis not present

## 2021-09-25 DIAGNOSIS — E66813 Obesity, class 3: Secondary | ICD-10-CM

## 2021-09-25 DIAGNOSIS — I10 Essential (primary) hypertension: Secondary | ICD-10-CM

## 2021-09-25 DIAGNOSIS — E1165 Type 2 diabetes mellitus with hyperglycemia: Secondary | ICD-10-CM | POA: Diagnosis not present

## 2021-09-25 DIAGNOSIS — Z6841 Body Mass Index (BMI) 40.0 and over, adult: Secondary | ICD-10-CM | POA: Diagnosis not present

## 2021-09-25 MED ORDER — XIGDUO XR 10-1000 MG PO TB24: ORAL_TABLET | ORAL | 2 refills | Status: DC

## 2021-09-25 NOTE — Patient Instructions (Signed)

## 2021-09-25 NOTE — Progress Notes (Signed)
I,Tianna Badgett,acting as a Education administrator for Maximino Greenland, MD.,have documented all relevant documentation on the behalf of Maximino Greenland, MD,as directed by  Maximino Greenland, MD while in the presence of Maximino Greenland, MD.  Subjective:     Patient ID: Marisa [name] , female    DOB: 05/03/1960 , 61 y.o.   MRN: [id number]   Chief Complaint  Patient presents with   Diabetes   Hypertension    HPI  She is here today for diabetes and BP f/u.  She reports compliance with meds. She denies headaches, chest pain and shortness of breath. She states she has not exercised for the past two months due to ankle injury. She did not seek medical attention, she states she nursed it herself. She is now better and plans to resume her regular walking regimen.   Diabetes She presents for her follow-up diabetic visit. She has type 2 diabetes mellitus. Her disease course has been stable. There are no hypoglycemic associated symptoms. Pertinent negatives for diabetes include no blurred vision. There are no hypoglycemic complications. Risk factors for coronary artery disease include diabetes mellitus, hypertension, sedentary lifestyle, post-menopausal, dyslipidemia and obesity. She is following a diabetic diet. She does not see a podiatrist. Hypertension This is a chronic problem. The current episode started more than 1 year ago. The problem has been gradually improving since onset. The problem is uncontrolled. Pertinent negatives include no blurred vision. Risk factors for coronary artery disease include diabetes mellitus, dyslipidemia, obesity, post-menopausal state and sedentary lifestyle. The current treatment provides moderate improvement. Compliance problems include exercise.      Past Medical History:  Diagnosis Date   Borderline diabetes    Hypertension      Family History  Problem Relation Age of Onset   Diabetes Mother    Hypertension Mother      Current Outpatient Medications:     albuterol (PROVENTIL) (2.5 MG/3ML) 0.083% nebulizer solution, Take 3 mLs (2.5 mg total) by nebulization every 4 (four) hours as needed for wheezing or shortness of breath (for neb machine ordered today.)., Disp: 75 mL, Rfl: 1   albuterol (VENTOLIN HFA) 108 (90 Base) MCG/ACT inhaler, Inhale 2 puffs into the lungs every 6 (six) hours as needed for wheezing or shortness of breath (and cough)., Disp: 8 g, Rfl: 3   atorvastatin (LIPITOR) 40 MG tablet, Take one tablet by mouth everyday except sundays, Disp: 90 tablet, Rfl: 1   fluticasone (FLOVENT DISKUS) 50 MCG/BLIST diskus inhaler, Inhale into the lungs., Disp: , Rfl:    folic acid (FOLVITE) 1 MG tablet, One tablet po daily, Disp: 90 tablet, Rfl: 1   linaclotide (LINZESS) 145 MCG CAPS capsule, TK 1 C PO  BEFORE FIRST MEAL OF THE DAY., Disp: 90 capsule, Rfl: 1   Olmesartan-amLODIPine-HCTZ 40-10-25 MG TABS, Take 1 tablet by mouth daily., Disp: 90 tablet, Rfl: 1   Semaglutide, 1 MG/DOSE, (OZEMPIC, 1 MG/DOSE,) 2 MG/1.5ML SOPN, Inject 1 mg into the skin once a week., Disp: 4.5 mL, Rfl: 1   Vitamin D, Ergocalciferol, (DRISDOL) 1.25 MG (50000 UNIT) CAPS capsule, Take 1 capsule (50,000 Units total) by mouth every 7 (seven) days., Disp: 12 capsule, Rfl: 0   Dapagliflozin-metFORMIN HCl ER (XIGDUO XR) 10-998 MG TB24, Take one tablet by mouth daily, Disp: 90 tablet, Rfl: 2   No Known Allergies   Review of Systems  Constitutional: Negative.   Eyes:  Negative for blurred vision.  Respiratory: Negative.    Cardiovascular: Negative.   Gastrointestinal:  Negative.   Neurological: Negative.   Psychiatric/Behavioral: Negative.       Today's Vitals   09/25/21 0947 09/25/21 1001  BP: (!) 166/88 (!) 146/88  Pulse: 88   Temp: 98.1 F (36.7 C)   TempSrc: Oral   Weight: 259 lb (117.5 kg)   Height: 5' 1.2" (1.554 m)    Body mass index is 48.62 kg/m.  Wt Readings from Last 3 Encounters:  09/25/21 259 lb (117.5 kg)  07/03/21 261 lb (118.4 kg)  06/14/21 261 lb  (118.4 kg)    Objective:  Physical Exam Vitals and nursing note reviewed.  Constitutional:      Appearance: Normal appearance. She is obese.  HENT:     Head: Normocephalic and atraumatic.  Eyes:     Extraocular Movements: Extraocular movements intact.  Cardiovascular:     Rate and Rhythm: Normal rate and regular rhythm.     Heart sounds: Normal heart sounds.  Pulmonary:     Effort: Pulmonary effort is normal.     Breath sounds: Normal breath sounds.  Musculoskeletal:     Cervical back: Normal range of motion.  Skin:    General: Skin is warm.  Neurological:     General: No focal deficit present.     Mental Status: She is alert.  Psychiatric:        Mood and Affect: Mood normal.        Behavior: Behavior normal.       Assessment And Plan:     1. Uncontrolled type 2 diabetes mellitus with hyperglycemia ([place]) Comments: Unfortunately, she has not been taking Xigduo. I will send rx to mail order. I will check labs as below. She agrees to rto in 4-6 weeks to recheck renal fxn. - CMP14+EGFR - Hemoglobin A1c - Dapagliflozin-metFORMIN HCl ER (XIGDUO XR) 10-998 MG TB24; Take one tablet by mouth daily  Dispense: 90 tablet; Refill: 2  2. Essential hypertension, benign Comments: Chronic , uncontrolled. I will not add another medication at this time. If still elevated with Xigduo on board, then I will add another medication. F/u 4-6 wks.  3. Class 3 severe obesity due to excess calories with serious comorbidity and body mass index (BMI) of 45.0 to 49.9 in adult [facility]) Comments: BMI 48. She is congratulated on her 2lb weight loss since June 2023. She is encouraged to lose 10% of body weight over the next 3-6 months.   4. Immunization due - Zoster Recombinant (Shingrix )  Patient was given opportunity to ask questions. Patient verbalized understanding of the plan and was able to repeat key elements of the plan. All questions were answered to their satisfaction.   I, Maximino Greenland,  MD, have reviewed all documentation for this visit. The documentation on 09/25/21 for the exam, diagnosis, procedures, and orders are all accurate and complete.   IF YOU HAVE BEEN REFERRED TO A SPECIALIST, IT MAY TAKE 1-2 WEEKS TO SCHEDULE/PROCESS THE REFERRAL. IF YOU HAVE NOT HEARD FROM US/SPECIALIST IN TWO WEEKS, PLEASE GIVE [place] A CALL AT [phone].   THE PATIENT IS ENCOURAGED TO PRACTICE SOCIAL DISTANCING DUE TO THE COVID-19 PANDEMIC.

## 2021-09-26 LAB — CMP14+EGFR
ALT: 12 IU/L (ref 0–32)
AST: 16 IU/L (ref 0–40)
Albumin/Globulin Ratio: 1.3 (ref 1.2–2.2)
Albumin: 4.5 g/dL (ref 3.9–4.9)
Alkaline Phosphatase: 117 IU/L (ref 44–121)
BUN/Creatinine Ratio: 12 (ref 12–28)
BUN: 10 mg/dL (ref 8–27)
Bilirubin Total: 0.4 mg/dL (ref 0.0–1.2)
CO2: 26 mmol/L (ref 20–29)
Calcium: 9.6 mg/dL (ref 8.7–10.3)
Chloride: 97 mmol/L (ref 96–106)
Creatinine, Ser: 0.86 mg/dL (ref 0.57–1.00)
Globulin, Total: 3.4 g/dL (ref 1.5–4.5)
Glucose: 201 mg/dL — ABNORMAL HIGH (ref 70–99)
Potassium: 3.6 mmol/L (ref 3.5–5.2)
Sodium: 139 mmol/L (ref 134–144)
Total Protein: 7.9 g/dL (ref 6.0–8.5)
eGFR: 77 mL/min/{1.73_m2} (ref >59)

## 2021-09-26 LAB — HEMOGLOBIN A1C
Est. average glucose Bld gHb Est-mCnc: 197 mg/dL
Hgb A1c MFr Bld: 8.5 % — ABNORMAL HIGH (ref 4.8–5.6)

## 2021-09-28 ENCOUNTER — Encounter: Payer: Self-pay | Admitting: Internal Medicine

## 2021-10-03 ENCOUNTER — Ambulatory Visit (INDEPENDENT_AMBULATORY_CARE_PROVIDER_SITE_OTHER): Admitting: Neurology

## 2021-10-03 ENCOUNTER — Encounter: Payer: Self-pay | Admitting: Neurology

## 2021-10-03 VITALS — BP 138/82 | HR 87 | Ht 60.0 in | Wt 263.0 lb

## 2021-10-03 DIAGNOSIS — G4733 Obstructive sleep apnea (adult) (pediatric): Secondary | ICD-10-CM | POA: Diagnosis not present

## 2021-10-03 DIAGNOSIS — R0683 Snoring: Secondary | ICD-10-CM | POA: Diagnosis not present

## 2021-10-03 DIAGNOSIS — R0601 Orthopnea: Secondary | ICD-10-CM

## 2021-10-03 DIAGNOSIS — E1169 Type 2 diabetes mellitus with other specified complication: Secondary | ICD-10-CM | POA: Insufficient documentation

## 2021-10-03 DIAGNOSIS — G4734 Idiopathic sleep related nonobstructive alveolar hypoventilation: Secondary | ICD-10-CM

## 2021-10-03 DIAGNOSIS — R809 Proteinuria, unspecified: Secondary | ICD-10-CM | POA: Insufficient documentation

## 2021-10-03 DIAGNOSIS — E1129 Type 2 diabetes mellitus with other diabetic kidney complication: Secondary | ICD-10-CM | POA: Insufficient documentation

## 2021-10-03 NOTE — Progress Notes (Signed)
SLEEP MEDICINE CLINIC    Provider:  Melvyn Novas, MD  Primary Care Physician:  Dorothyann Peng, MD [address] [place] [address]     Referring Provider: Dorothyann Peng, Md [address] [place],  [place] [address]          Chief Complaint according to patient   Patient presents with:     New Patient (Initial Visit)           HISTORY OF PRESENT ILLNESS:  Marisa [name] is a 61 y.o. year old Black or [place] American female patient seen here as a referral on 10/03/2021 from PCP for a Sleep medicine consultation. .  Chief concern according to patient :   She states that when she would lay down she would have a dry cough as long as she worked- she worked at [employer] until November 22 . She states that she doesn't do that much anymore. Averages about 8 hours of sleep. She wakes up frequently to use the bathroom and goes back to sleep.  Snores in sleep per her husband.    BREIA OCAMPO  has a past medical history of Borderline diabetes and Hypertension. Snoring loudly, Nocturia, Super-obesity. Mouth breather sleeps prone, can't sleep supine, orthopnoea with 3 pillows.    Still gaining weight. Prediabetic- 6. 2 Hb A1c.  Sleep relevant medical history: see above . She worked until Nov 2022 in a dusty environment.     Family medical /sleep history: No other family member on CPAP with OSA, no insomnia, no sleep walkers.    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  and lives in a household with spouse, daughter and her baby- and a 65 year-old grandson with autism.    Family status is married, with 3 adult , 59, 23, 56 -year -old children, 8 grandchildren.  The patient used to work in shifts( night/ rotating,) Tobacco use: never .    ETOH use : 2/ year, Caffeine intake in form of Coffee( 2 a day, can drink up to dinner time) Soda( /) Tea ( /) or energy drinks. Regular exercise  in form of - walking- foot pain. Knee pain.   Hobbies : cooking.       Sleep habits are as follows: The patient's dinner time is between variable - PM. The patient goes to bed at 10 PM or 3 AM - and continues to sleep for 6 hours, wakes for several  bathroom breaks, the first time at 2 AM.    The preferred sleep position is prone or on her side, elevated head of bed,., with the support of 3 pillows. Dreams are reportedly frequent/vivid.  No set time when she gets up- no  usual rise time. The patient wakes up spontaneously . Usually after 6 hours.   She reports mostly  feeling refreshed or restored in AM, with symptoms such as dry mouth, no morning headaches, and residual fatigue.  Naps are taken more frequently, but she compensates with coffee- sleepiness after a meal-  lasting from 1-2 hours  and are as refreshing than nocturnal sleep.    Review of Systems: Out of a complete 14 system review, the patient complains of only the following symptoms, and all other reviewed systems are negative.:  Fatigue, sleepiness , snoring, has a lot of energy, has no set routines of meals or sleep-   How likely are  you to doze in the following situations: 0 = not likely, 1 = slight chance, 2 = moderate chance, 3 = high chance   Sitting and Reading? Watching Television? Sitting inactive in a public place (theater or meeting)? As a passenger in a car for an hour without a break? Lying down in the afternoon when circumstances permit? Sitting and talking to someone? Sitting quietly after lunch without alcohol? In a car, while stopped for a few minutes in traffic?   Total = 5/ 24 points   FSS endorsed at 14/ 63 points.     Social History   Socioeconomic History   Marital status: Married    Spouse name: Not on file   Number of children: Not on file   Years of education: Not on file   Highest education level: Not on file  Occupational History   Not on file  Tobacco Use   Smoking status: Never    Smokeless tobacco: Never  Vaping Use   Vaping Use: Never used  Substance and Sexual Activity   Alcohol use: No   Drug use: No   Sexual activity: Not on file  Other Topics Concern   Not on file  Social History Narrative   Not on file   Social Determinants of Health   Financial Resource Strain: Not on file  Food Insecurity: Not on file  Transportation Needs: Not on file  Physical Activity: Not on file  Stress: Not on file  Social Connections: Not on file    Family History  Problem Relation Age of Onset   Diabetes Mother    Hypertension Mother     Past Medical History:  Diagnosis Date   Borderline diabetes    Hypertension     Past Surgical History:  Procedure Laterality Date   ABDOMINAL HYSTERECTOMY     CESAREAN SECTION     SHOULDER SURGERY       Current Outpatient Medications on File Prior to Visit  Medication Sig Dispense Refill   albuterol (PROVENTIL) (2.5 MG/3ML) 0.083% nebulizer solution Take 3 mLs (2.5 mg total) by nebulization every 4 (four) hours as needed for wheezing or shortness of breath (for neb machine ordered today.). 75 mL 1   albuterol (VENTOLIN HFA) 108 (90 Base) MCG/ACT inhaler Inhale 2 puffs into the lungs every 6 (six) hours as needed for wheezing or shortness of breath (and cough). 8 g 3   atorvastatin (LIPITOR) 40 MG tablet Take one tablet by mouth everyday except sundays 90 tablet 1   Dapagliflozin-metFORMIN HCl ER (XIGDUO XR) 10-998 MG TB24 Take one tablet by mouth daily 90 tablet 2   fluticasone (FLOVENT DISKUS) 50 MCG/BLIST diskus inhaler Inhale into the lungs.     folic acid (FOLVITE) 1 MG tablet One tablet po daily 90 tablet 1   linaclotide (LINZESS) 145 MCG CAPS capsule TK 1 C PO  BEFORE FIRST MEAL OF THE DAY. 90 capsule 1   Olmesartan-amLODIPine-HCTZ 40-10-25 MG TABS Take 1 tablet by mouth daily. 90 tablet 1   Semaglutide, 1 MG/DOSE, (OZEMPIC, 1 MG/DOSE,) 2 MG/1.5ML SOPN Inject 1 mg into the skin once a week. 4.5 mL 1   Vitamin D,  Ergocalciferol, (DRISDOL) 1.25 MG (50000 UNIT) CAPS capsule Take 1 capsule (50,000 Units total) by mouth every 7 (seven) days. 12 capsule 0   No current facility-administered medications on file prior to visit.    No Known Allergies  Physical exam:  Today's Vitals   10/03/21 1006  BP: 138/82  Pulse: 87  Weight: 263 lb (119.3 kg)  Height: 5' (1.524 m)   Body mass index is 51.36 kg/m.   Wt Readings from Last 3 Encounters:  10/03/21 263 lb (119.3 kg)  09/25/21 259 lb (117.5 kg)  07/03/21 261 lb (118.4 kg)     Ht Readings from Last 3 Encounters:  10/03/21 5' (1.524 m)  09/25/21 5' 1.2" (1.554 m)  07/03/21 5' 1.2" (1.554 m)      General: The patient is awake, alert and appears not in acute distress. The patient is well groomed. Head: Normocephalic, atraumatic. Neck is supple.  Mallampati 3 plus ,  neck circumference:18 inches . Nasal airflow  patent.  Retrognathia is not  seen.  Dental status: biological , TMJ.  Cardiovascular:  Regular rate and cardiac rhythm by pulse,  without distended neck veins. Respiratory:  deferred.  Skin:  With evidence of ankle edema,. Trunk: The patient's posture is erect.   Neurologic exam : The patient is awake and alert, oriented to place and time.   Memory subjective described as intact.  Attention span & concentration ability appears normal.  Speech is fluent,  without dysarthria, dysphonia or aphasia.  Mood and affect are appropriate.   Cranial nerves: no loss of smell or taste reported  Pupils are equal and briskly reactive to light. Funduscopic exam deferred. .  Extraocular movements in vertical and horizontal planes were intact and without nystagmus. No Diplopia. Visual fields by finger perimetry are intact. Hearing was intact to soft voice and finger rubbing.    Facial sensation intact to fine touch.  Facial motor strength is symmetric and tongue and uvula move midline.  Neck ROM : rotation, tilt and flexion extension were normal  for age and shoulder shrug was symmetrical.    Motor exam:  Symmetric bulk, tone and ROM.   Normal tone without cog -wheeling, symmetric grip strength .   Sensory:  Fine touch,  vibration were  normal.  Proprioception tested in the upper extremities was normal.   Coordination: Rapid alternating movements in the fingers/hands were of normal speed.  The Finger-to-nose maneuver was intact without evidence of ataxia, dysmetria or tremor.   Gait and station: Patient could rise unassisted from a seated position, walked without assistive device. She reports foot pain, had until recently knee pain- she walks with a slight limp.  Stance is of  wider base .  Toe and heel walk were deferred.  Deep tendon reflexes: in the  upper and lower extremities are symmetric and intact.  Babinski response was deferred.       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   2) prediabetic, knee pain, heel pain, have limited her exercise capacity.   3) snoring and witnessed apnea with orthopnoea-     My Plan is to proceed with:  1) I suspect obesity hypoventilation or REM sleep dependent OSA, by BMI, elevated head of bed.  I will order a PSG split and if not permitted, a HST>  2) the patient feels good about her sleep quality, in spite of not having routines, neither meal times, not bedtimes nor rise times- she feels this  is a reward for her retirement.  3) nocturia is likely related to  fluid intake , may be untreated OSA or glucosuria.  I would like to thank Glendale Chard, MD and Glendale Chard, [address] [place],  [place]  [id number] for allowing me to meet with and to take care of this pleasant patient.   In short, ADALIN WATLER is presenting with orthopnea, snoring and had until recently fatigue and EDS endorsed , which have now resolved - unsure why. ,  I plan to follow up either personally or through our NP within 3-4 months.   CC: I will share my notes with PCP.  Electronically signed by: Larey Seat, MD 10/03/2021 10:28 AM  [facility] Board certified by The [employer] of Sleep Medicine and Diplomate of the [employer] of Sleep Medicine. Board certified In Neurology through the [employer], Fellow of the [employer] of Neurology. Medical Director of [facility].

## 2021-10-03 NOTE — Patient Instructions (Signed)
Quality Sleep Information, Adult Quality sleep is important for your mental and physical health. It also improves your quality of life. Quality sleep means you: Are asleep for most of the time you are in bed. Fall asleep within 30 minutes. Wake up no more than once a night. Are awake for no longer than 20 minutes if you do wake up during the night. Most adults need 7-8 hours of quality sleep each night. How can poor sleep affect me? If you do not get enough quality sleep, you may have: Mood swings. Daytime sleepiness. Decreased alertness, reaction time, and concentration. Sleep disorders, such as insomnia and sleep apnea. Difficulty with: Solving problems. Coping with stress. Paying attention. These issues may affect your performance and productivity at work, school, and home. Lack of sleep may also put you at higher risk for accidents, suicide, and risky behaviors. If you do not get quality sleep, you may also be at higher risk for several health problems, including: Infections. Type 2 diabetes. Heart disease. High blood pressure. Obesity. Worsening of long-term conditions, like arthritis, kidney disease, depression, Parkinson's disease, and epilepsy. What actions can I take to get more quality sleep? Sleep schedule and routine Stick to a sleep schedule. Go to sleep and wake up at about the same time each day. Do not try to sleep less on weekdays and make up for lost sleep on weekends. This does not work. Limit naps during the day to 30 minutes or less. Do not take naps in the late afternoon. Make time to relax before bed. Reading, listening to music, or taking a hot bath promotes quality sleep. Make your bedroom a place that promotes quality sleep. Keep your bedroom dark, quiet, and at a comfortable room temperature. Make sure your bed is comfortable. Avoid using electronic devices that give off bright blue light for 30 minutes before bedtime. Your brain perceives bright blue light  as sunlight. This includes television, phones, and computers. If you are lying awake in bed for longer than 20 minutes, get up and do a relaxing activity until you feel sleepy. Lifestyle     Try to get at least 30 minutes of exercise on most days. Do not exercise 2-3 hours before going to bed. Do not use any products that contain nicotine or tobacco. These products include cigarettes, chewing tobacco, and vaping devices, such as e-cigarettes. If you need help quitting, ask your health care provider. Do not drink caffeinated beverages for at least 8 hours before going to bed. Coffee, tea, and some sodas contain caffeine. Do not drink alcohol or eat large meals close to bedtime. Try to get at least 30 minutes of sunlight every day. Morning sunlight is best. Medical concerns Work with your health care provider to treat medical conditions that may affect sleeping, such as: Nasal obstruction. Snoring. Sleep apnea and other sleep disorders. Talk to your health care provider if you think any of your prescription medicines may cause you to have difficulty falling or staying asleep. If you have sleep problems, talk with a sleep consultant. If you think you have a sleep disorder, talk with your health care provider about getting evaluated by a specialist. Where to find more information Sleep Foundation: sleepfoundation.org American Academy of Sleep Medicine: aasm.org Centers for Disease Control and Prevention (CDC): StoreMirror.com.cy Contact a health care provider if: You have trouble getting to sleep or staying asleep. You often wake up very early in the morning and cannot get back to sleep. You have daytime sleepiness. You  have daytime sleep attacks of suddenly falling asleep and sudden muscle weakness (narcolepsy). You have a tingling sensation in your legs with a strong urge to move your legs (restless legs syndrome). You stop breathing briefly during sleep (sleep apnea). You think you have a sleep  disorder or are taking a medicine that is affecting your quality of sleep. Summary Most adults need 7-8 hours of quality sleep each night. Getting enough quality sleep is important for your mental and physical health. Make your bedroom a place that promotes quality sleep, and avoid things that may cause you to have poor sleep, such as alcohol, caffeine, smoking, or large meals. Talk to your health care provider if you have trouble falling asleep or staying asleep. This information is not intended to replace advice given to you by your health care provider. Make sure you discuss any questions you have with your health care provider. Document Revised: 05/09/2021 Document Reviewed: 05/09/2021 Elsevier Patient Education  2023 Elsevier Inc. Sleep Apnea Sleep apnea affects breathing during sleep. It causes breathing to stop for 10 seconds or more, or to become shallow. People with sleep apnea usually snore loudly. It can also increase the risk of: Heart attack. Stroke. Being very overweight (obese). Diabetes. Heart failure. Irregular heartbeat. High blood pressure. The goal of treatment is to help you breathe normally again. What are the causes?  The most common cause of this condition is a collapsed or blocked airway. There are three kinds of sleep apnea: Obstructive sleep apnea. This is caused by a blocked or collapsed airway. Central sleep apnea. This happens when the brain does not send the right signals to the muscles that control breathing. Mixed sleep apnea. This is a combination of obstructive and central sleep apnea. What increases the risk? Being overweight. Smoking. Having a small airway. Being older. Being female. Drinking alcohol. Taking medicines to calm yourself (sedatives or tranquilizers). Having family members with the condition. Having a tongue or tonsils that are larger than normal. What are the signs or symptoms? Trouble staying asleep. Loud snoring. Headaches in  the morning. Waking up gasping. Dry mouth or sore throat in the morning. Being sleepy or tired during the day. If you are sleepy or tired during the day, you may also: Not be able to focus your mind (concentrate). Forget things. Get angry a lot and have mood swings. Feel sad (depressed). Have changes in your personality. Have less interest in sex, if you are female. Be unable to have an erection, if you are female. How is this treated?  Sleeping on your side. Using a medicine to get rid of mucus in your nose (decongestant). Avoiding the use of alcohol, medicines to help you relax, or certain pain medicines (narcotics). Losing weight, if needed. Changing your diet. Quitting smoking. Using a machine to open your airway while you sleep, such as: An oral appliance. This is a mouthpiece that shifts your lower jaw forward. A CPAP device. This device blows air through a mask when you breathe out (exhale). An EPAP device. This has valves that you put in each nostril. A BIPAP device. This device blows air through a mask when you breathe in (inhale) and breathe out. Having surgery if other treatments do not work. Follow these instructions at home: Lifestyle Make changes that your doctor recommends. Eat a healthy diet. Lose weight if needed. Avoid alcohol, medicines to help you relax, and some pain medicines. Do not smoke or use any products that contain nicotine or tobacco. If you  need help quitting, ask your doctor. General instructions Take over-the-counter and prescription medicines only as told by your doctor. If you were given a machine to use while you sleep, use it only as told by your doctor. If you are having surgery, make sure to tell your doctor you have sleep apnea. You may need to bring your device with you. Keep all follow-up visits. Contact a doctor if: The machine that you were given to use during sleep bothers you or does not seem to be working. You do not get  better. You get worse. Get help right away if: Your chest hurts. You have trouble breathing in enough air. You have an uncomfortable feeling in your back, arms, or stomach. You have trouble talking. One side of your body feels weak. A part of your face is hanging down. These symptoms may be an emergency. Get help right away. Call your local emergency services (911 in the [place] Do not wait to see if the symptoms will go away. Do not drive yourself to the hospital. Summary This condition affects breathing during sleep. The most common cause is a collapsed or blocked airway. The goal of treatment is to help you breathe normally while you sleep. This information is not intended to replace advice given to you by your health care provider. Make sure you discuss any questions you have with your health care provider. Document Revised: 08/23/2020 Document Reviewed: 12/24/2019 Elsevier Patient Education  2023 [employer].

## 2021-10-08 ENCOUNTER — Encounter: Payer: Self-pay | Admitting: Internal Medicine

## 2021-10-16 ENCOUNTER — Ambulatory Visit

## 2021-10-16 ENCOUNTER — Telehealth: Payer: Self-pay

## 2021-10-16 NOTE — Telephone Encounter (Signed)
LVM for pt to call back to schedule sleep study.  

## 2021-10-25 NOTE — Telephone Encounter (Signed)
Left voicemail for patient to call back. 

## 2021-10-29 NOTE — Telephone Encounter (Signed)
LVM for pt to call back to schedule.

## 2021-10-31 NOTE — Telephone Encounter (Signed)
LVM for pt to call back to schedule.

## 2021-11-01 NOTE — Telephone Encounter (Signed)
Patient returned my call.  Split- [place] no auth req- pt chose.  Patient is scheduled at [facility] for 11/09/21 at 9 pm.  Mailed packet to the patient.

## 2021-11-06 ENCOUNTER — Other Ambulatory Visit: Payer: Self-pay

## 2021-11-06 ENCOUNTER — Ambulatory Visit

## 2021-11-06 MED ORDER — ATORVASTATIN CALCIUM 40 MG PO TABS: ORAL_TABLET | ORAL | 1 refills | Status: DC

## 2021-11-09 ENCOUNTER — Ambulatory Visit (INDEPENDENT_AMBULATORY_CARE_PROVIDER_SITE_OTHER): Admitting: Neurology

## 2021-11-09 DIAGNOSIS — G4733 Obstructive sleep apnea (adult) (pediatric): Secondary | ICD-10-CM

## 2021-11-09 DIAGNOSIS — R0683 Snoring: Secondary | ICD-10-CM | POA: Diagnosis not present

## 2021-11-09 DIAGNOSIS — E1129 Type 2 diabetes mellitus with other diabetic kidney complication: Secondary | ICD-10-CM

## 2021-11-09 DIAGNOSIS — R0601 Orthopnea: Secondary | ICD-10-CM

## 2021-11-13 ENCOUNTER — Ambulatory Visit

## 2021-11-13 ENCOUNTER — Other Ambulatory Visit: Payer: Self-pay

## 2021-11-13 VITALS — BP 160/60 | HR 92 | Temp 98.1°F | Ht 60.0 in | Wt 263.0 lb

## 2021-11-13 DIAGNOSIS — I10 Essential (primary) hypertension: Secondary | ICD-10-CM

## 2021-11-13 MED ORDER — HYDROCHLOROTHIAZIDE 25 MG PO TABS: 25.0000 mg | ORAL_TABLET | Freq: Every day | ORAL | Status: DC

## 2021-11-13 NOTE — Progress Notes (Signed)
Patient presents today for a bp check. Patient is currently on Olmesartan-amlodipine-HCTZ 40-10-25.  BP Readings from Last 3 Encounters:  11/13/21 (!) 160/70  10/03/21 138/82  09/25/21 (!) 146/88  Patient also discussed insulin but is not interested in starting it today. She states she will start back walking and watching what she eats.  Per provider - add hctz 25mg  - nurse in 2w f/u with sanders in 6w

## 2021-11-15 ENCOUNTER — Ambulatory Visit

## 2021-11-22 ENCOUNTER — Telehealth: Payer: Self-pay | Admitting: *Deleted

## 2021-11-22 ENCOUNTER — Other Ambulatory Visit: Payer: Self-pay

## 2021-11-22 MED ORDER — HYDROCHLOROTHIAZIDE 25 MG PO TABS: 25.0000 mg | ORAL_TABLET | Freq: Every day | ORAL | 1 refills | Status: DC

## 2021-11-22 NOTE — Telephone Encounter (Signed)
Called pt. Relayed results per Dr. Edwena Felty note. She verbalized understanding. She is unsure if she needs to get set up with CPAP via [facility] or not. She will speak with PCP/insurance first and then will call back to let us know what she finds out.

## 2021-11-22 NOTE — Telephone Encounter (Signed)
-----   Message from Larey Seat, MD sent at 11/22/2021  1:04 PM EDT -----  54 apneas (53 obstructive; 0 central; 1 mixed), and 44 hypopneas. Apnea index was 9.9/h. Hypopnea index was 8.1/h. The AHI = apnea-hypopnea index was 18.0/h overall. Oxyhemoglobin Saturation Nadir during sleep was at 69% from a mean saturation of 92%.  Of the Total Sleep Time (TST), time in Hypoxemia (<89%) was present for 39.7 minutes, or 12.2% of total sleep time.   IMPRESSION: 1. Sleep disordered breathing was present- Obstructive Sleep apnea was seen at a mild degree -but was associated withoxygen desaturation as well as snoring. 2.  Orthopnoea was noted, the patient sleeps elevated and supine only.  3. Sleep efficiency was80%.Sleep fragmentationwas noted- The majority of sleep arousals were related to hypoxic- obstructive - hypopneic events in N2 sleep.   RECOMMENDATIONS: The sleep study did not capture REM sleep which would likey have contributed to a higher AHI.  Since there was hypoventilation and hypoxemia present, positive airway therapy= CPAP therapy, is recommended. Order for ResMed auto titration device, heated humidification and interface of choice shall be provided, settings from 6-18 cm water with 2 cm EPR.

## 2021-11-22 NOTE — Addendum Note (Signed)
Addended by: Larey Seat on: 11/22/2021 01:04 PM   Modules accepted: Orders

## 2021-11-22 NOTE — Procedures (Signed)
[facility] POLYSOMNOGRAPHY  INTERPRETATION REPORT   STUDY DATE:  11/09/2021     PATIENT NAME:  Marisa [name]         DATE OF BIRTH:  [birth year]  PATIENT ID:  [id number]    TYPE OF STUDY:  PSG  READING PHYSICIAN: Larey Seat, MD REFERRED BY: Glendale Chard, MD SCORING TECHNICIAN: Gaylyn Cheers, RPSGT   HISTORY:10-03-2021: Orthopnoea, pre-diabetes, Super obesity- high risk for OSA. Snoring, Nocturia.  ADDITIONAL INFORMATION:  The Epworth Sleepiness Scale endorsed at 5 /24 points (scores above or equal to 10 are suggestive of hypersomnolence). FSS endorsed at 14 /63 points. Height: 60 in Weight: 263 lbs (BMI 51) Neck Size: 18 in  MEDICATIONS: Proventil, Ventolin HFA, Lipitor, Xigduo XR, Flovent, Folvite, Linzess, Olmesartan-Amlodipine-HCT, Ozempic, Drisdol  TECHNICAL DESCRIPTION: A registered sleep technologist (RPSGT) was in attendance for the duration of the recording.  Data collection, scoring, video monitoring, and reporting were performed in compliance with the AASM Manual for the Scoring of Sleep and Associated Events; (Hypopnea is scored based on the criteria listed in Section VIII D. 1b in the AASM Manual V2.6 using a 4% oxygen desaturation rule or Hypopnea is scored based on the criteria listed in Section VIII D. 1a in the AASM Manual V2.6 using 3% oxygen desaturation and /or arousal rule).   SLEEP CONTINUITY AND SLEEP ARCHITECTURE:  Lights-out was at 22:16: and lights-on at  05:02:, with  6.8 hours (408 minutes ) of recording time . Total sleep time (TST) was 326.5 minutes with a decreased sleep efficiency at 80.3%.   Sleep latency was normal at 8.0 minutes.  REM sleep latency was decreased at 0.0 minutes. Of the total sleep time, the percentage of stage N1 sleep was 0.3%, stage N2 sleep was 59%, stage N3 sleep was 40.3%, and REM sleep was 0.0%. WASO time was 78 minutes. BODY POSITION:  TST was divided between the following sleep positions:  in supine 326 minutes (100%), in non-supine 0 minutes (0%).  There were 0 Stage R periods observed on this study night, 6 awakenings (i.e. transitions to Stage W from any sleep stage), and 32 total stage transitions.   RESPIRATORY MONITORING:  Based on AASM criteria, there were 54 apneas (53 obstructive; 0 central; 1 mixed), and 44 hypopneas. Apnea index was 9.9/h. Hypopnea index was 8.1/h. The AHI = apnea-hypopnea index was 18.0/h overall.  Total time in sleep was 100% supine - with an AHI at 18.0/h supine. There was no REM sleep. There were 0 respiratory effort-related arousals (RERAs).  Continuous snoring was recorded.  OXIMETRY: Oxyhemoglobin Saturation Nadir during sleep was at 69% from a mean saturation of 92%.  Of the Total Sleep Time (TST), time in Hypoxemia (<89%) was present for 39.7 minutes, or 12.2% of total sleep time.  LIMB MOVEMENTS: There were 0 periodic limb movements of sleep recorded.  AROUSAL: There were 13 arousals in total, for an arousal index of 2 arousals/hour.  Of these, 4 were identified as respiratory-related arousals (1 /h), 0 were PLM-related arousals (0 /h), and 9 were non-specific arousals (2 /h). EEG:  PSG EEG was of normal amplitude and frequency, with symmetric manifestation of sleep stages. EKG: The electrocardiogram documented NSR. The average heart rate during sleep was 84 bpm.  The heart rate during sleep varied between a minimum of 75 bpm and a maximum of  101 bpm. AUDIO and VIDEO: No complex movements, phonations or vocalizations were recorded.  IMPRESSION: Sleep disordered breathing was present- Obstructive Sleep  apnea was seen at a mild degree -but was associated with oxygen desaturation as well as snoring.  Orthopnoea was noted, the patient sleeps elevated and supine only.  Sleep efficiency was 80%. Sleep fragmentation was noted- The majority of sleep arousals were related to hypoxic- obstructive - hypopneic events in N2 sleep.   RECOMMENDATIONS: The sleep  study did not capture REM sleep which would likey have contributed to a higher AHI.  Since there was hypoventilation and hypoxemia present, positive airway therapy= CPAP therapy, is recommended. Order for ResMed auto titration device, heated humidification and interface of choice shall be provided, settings from 6-18 cm water with 2 cm EPR.   Larey Seat, MD Board certified in Neurology and Sleep by the [employer] and ABPN, Member of the AASM and AAN, Market researcher of [employer].

## 2021-12-04 ENCOUNTER — Ambulatory Visit: Payer: Self-pay

## 2021-12-06 NOTE — Telephone Encounter (Signed)
Called pt back. She received fax about CPAP from [facility]. They are needing [place] to send our recommendation about getting her set up with CPAP to fax# [phone].  Put [id number].  Husband ID: [id number]. We need to make sure to include [name]. Advised we will get this faxed over for her. She verbalized understanding.   I faxed CPAP order, office note, sleep study, insurance/demographics to fax# above. Received fax confirmation.

## 2021-12-06 NOTE — Telephone Encounter (Signed)
Patient left a voicemail on my phone stating she has some information that she would like to pass on about her CPAP.

## 2021-12-25 ENCOUNTER — Ambulatory Visit (INDEPENDENT_AMBULATORY_CARE_PROVIDER_SITE_OTHER): Admitting: Internal Medicine

## 2021-12-25 ENCOUNTER — Encounter: Payer: Self-pay | Admitting: Internal Medicine

## 2021-12-25 VITALS — BP 148/88 | HR 87 | Temp 98.3°F | Ht 60.0 in | Wt 260.6 lb

## 2021-12-25 DIAGNOSIS — E1165 Type 2 diabetes mellitus with hyperglycemia: Secondary | ICD-10-CM

## 2021-12-25 DIAGNOSIS — G4733 Obstructive sleep apnea (adult) (pediatric): Secondary | ICD-10-CM

## 2021-12-25 DIAGNOSIS — Z6841 Body Mass Index (BMI) 40.0 and over, adult: Secondary | ICD-10-CM

## 2021-12-25 DIAGNOSIS — I10 Essential (primary) hypertension: Secondary | ICD-10-CM

## 2021-12-25 LAB — HEMOGLOBIN A1C
Est. average glucose Bld gHb Est-mCnc: 203 mg/dL
Hgb A1c MFr Bld: 8.7 % — ABNORMAL HIGH (ref 4.8–5.6)

## 2021-12-25 LAB — BMP8+EGFR
BUN/Creatinine Ratio: 13 (ref 12–28)
BUN: 10 mg/dL (ref 8–27)
CO2: 26 mmol/L (ref 20–29)
Calcium: 9.5 mg/dL (ref 8.7–10.3)
Chloride: 98 mmol/L (ref 96–106)
Creatinine, Ser: 0.79 mg/dL (ref 0.57–1.00)
Glucose: 179 mg/dL — ABNORMAL HIGH (ref 70–99)
Potassium: 3.6 mmol/L (ref 3.5–5.2)
Sodium: 138 mmol/L (ref 134–144)
eGFR: 85 mL/min/{1.73_m2} (ref >59)

## 2021-12-25 MED ORDER — SPIRONOLACTONE 25 MG PO TABS: 25.0000 mg | ORAL_TABLET | Freq: Every day | ORAL | 11 refills | Status: DC

## 2021-12-25 NOTE — Patient Instructions (Signed)
Hypertension, Adult ?Hypertension is another name for high blood pressure. High blood pressure forces your heart to work harder to pump blood. This can cause problems over time. ?There are two numbers in a blood pressure reading. There is a top number (systolic) over a bottom number (diastolic). It is best to have a blood pressure that is below 120/80. ?What are the causes? ?The cause of this condition is not known. Some other conditions can lead to high blood pressure. ?What increases the risk? ?Some lifestyle factors can make you more likely to develop high blood pressure: ?Smoking. ?Not getting enough exercise or physical activity. ?Being overweight. ?Having too much fat, sugar, calories, or salt (sodium) in your diet. ?Drinking too much alcohol. ?Other risk factors include: ?Having any of these conditions: ?Heart disease. ?Diabetes. ?High cholesterol. ?Kidney disease. ?Obstructive sleep apnea. ?Having a family history of high blood pressure and high cholesterol. ?Age. The risk increases with age. ?Stress. ?What are the signs or symptoms? ?High blood pressure may not cause symptoms. Very high blood pressure (hypertensive crisis) may cause: ?Headache. ?Fast or uneven heartbeats (palpitations). ?Shortness of breath. ?Nosebleed. ?Vomiting or feeling like you may vomit (nauseous). ?Changes in how you see. ?Very bad chest pain. ?Feeling dizzy. ?Seizures. ?How is this treated? ?This condition is treated by making healthy lifestyle changes, such as: ?Eating healthy foods. ?Exercising more. ?Drinking less alcohol. ?Your doctor may prescribe medicine if lifestyle changes do not help enough and if: ?Your top number is above 130. ?Your bottom number is above 80. ?Your personal target blood pressure may vary. ?Follow these instructions at home: ?Eating and drinking ? ?If told, follow the DASH eating plan. To follow this plan: ?Fill one half of your plate at each meal with fruits and vegetables. ?Fill one fourth of your plate  at each meal with whole grains. Whole grains include whole-wheat pasta, brown rice, and whole-grain bread. ?Eat or drink low-fat dairy products, such as skim milk or low-fat yogurt. ?Fill one fourth of your plate at each meal with low-fat (lean) proteins. Low-fat proteins include fish, chicken without skin, eggs, beans, and tofu. ?Avoid fatty meat, cured and processed meat, or chicken with skin. ?Avoid pre-made or processed food. ?Limit the amount of salt in your diet to less than 1,500 mg each day. ?Do not drink alcohol if: ?Your doctor tells you not to drink. ?You are pregnant, may be pregnant, or are planning to become pregnant. ?If you drink alcohol: ?Limit how much you have to: ?0-1 drink a day for women. ?0-2 drinks a day for men. ?Know how much alcohol is in your drink. In the [place] one drink equals one 12 oz bottle of beer (355 mL), one 5 oz glass of wine (148 mL), or one 1? oz glass of hard liquor (44 mL). ?Lifestyle ? ?Work with your doctor to stay at a healthy weight or to lose weight. Ask your doctor what the best weight is for you. ?Get at least 30 minutes of exercise that causes your heart to beat faster (aerobic exercise) most days of the week. This may include walking, swimming, or biking. ?Get at least 30 minutes of exercise that strengthens your muscles (resistance exercise) at least 3 days a week. This may include lifting weights or doing Pilates. ?Do not smoke or use any products that contain nicotine or tobacco. If you need help quitting, ask your doctor. ?Check your blood pressure at home as told by your doctor. ?Keep all follow-up visits. ?Medicines ?Take over-the-counter and prescription medicines   only as told by your doctor. Follow directions carefully. ?Do not skip doses of blood pressure medicine. The medicine does not work as well if you skip doses. Skipping doses also puts you at risk for problems. ?Ask your doctor about side effects or reactions to medicines that you should watch  for. ?Contact a doctor if: ?You think you are having a reaction to the medicine you are taking. ?You have headaches that keep coming back. ?You feel dizzy. ?You have swelling in your ankles. ?You have trouble with your vision. ?Get help right away if: ?You get a very bad headache. ?You start to feel mixed up (confused). ?You feel weak or numb. ?You feel faint. ?You have very bad pain in your: ?Chest. ?Belly (abdomen). ?You vomit more than once. ?You have trouble breathing. ?These symptoms may be an emergency. Get help right away. Call 911. ?Do not wait to see if the symptoms will go away. ?Do not drive yourself to the hospital. ?Summary ?Hypertension is another name for high blood pressure. ?High blood pressure forces your heart to work harder to pump blood. ?For most people, a normal blood pressure is less than 120/80. ?Making healthy choices can help lower blood pressure. If your blood pressure does not get lower with healthy choices, you may need to take medicine. ?This information is not intended to replace advice given to you by your health care provider. Make sure you discuss any questions you have with your health care provider. ?Document Revised: 11/02/2020 Document Reviewed: 11/02/2020 ?Elsevier Patient Education ? 2023 Elsevier Inc. ? ?

## 2021-12-25 NOTE — Progress Notes (Signed)
Barnet Glasgow Martin,acting as a Education administrator for Maximino Greenland, MD.,have documented all relevant documentation on the behalf of Maximino Greenland, MD,as directed by  Maximino Greenland, MD while in the presence of Maximino Greenland, MD.    Subjective:     Patient ID: Marisa [name] , female    DOB: [birth year] , 61 y.o.   MRN: [id number]   Chief Complaint  Patient presents with   Hypertension   Diabetes    HPI  She is here today for diabetes and BP f/u.  She reports compliance with meds.  She denies headaches, chest pain and shortness of breath. Patient states she has been working out everyday.  BP Readings from Last 3 Encounters: 12/25/21 : (!) 140/88 11/13/21 : (!) 160/60 10/03/21 : 138/82    Diabetes She presents for her follow-up diabetic visit. She has type 2 diabetes mellitus. Her disease course has been stable. There are no hypoglycemic associated symptoms. Pertinent negatives for diabetes include no blurred vision. There are no hypoglycemic complications. Risk factors for coronary artery disease include diabetes mellitus, hypertension, sedentary lifestyle, post-menopausal, dyslipidemia and obesity. She is following a diabetic diet. She does not see a podiatrist. Hypertension This is a chronic problem. The current episode started more than 1 year ago. The problem has been gradually improving since onset. The problem is uncontrolled. Pertinent negatives include no blurred vision. Risk factors for coronary artery disease include diabetes mellitus, dyslipidemia, obesity, post-menopausal state and sedentary lifestyle. The current treatment provides moderate improvement. Compliance problems include exercise.      Past Medical History:  Diagnosis Date   Borderline diabetes    Hypertension      Family History  Problem Relation Age of Onset   Diabetes Mother    Hypertension Mother      Current Outpatient Medications:    albuterol (PROVENTIL) (2.5 MG/3ML) 0.083% nebulizer  solution, Take 3 mLs (2.5 mg total) by nebulization every 4 (four) hours as needed for wheezing or shortness of breath (for neb machine ordered today.)., Disp: 75 mL, Rfl: 1   albuterol (VENTOLIN HFA) 108 (90 Base) MCG/ACT inhaler, Inhale 2 puffs into the lungs every 6 (six) hours as needed for wheezing or shortness of breath (and cough)., Disp: 8 g, Rfl: 3   atorvastatin (LIPITOR) 40 MG tablet, Take one tablet by mouth everyday except sundays, Disp: 90 tablet, Rfl: 1   Dapagliflozin-metFORMIN HCl ER (XIGDUO XR) 10-998 MG TB24, Take one tablet by mouth daily, Disp: 90 tablet, Rfl: 2   fluticasone (FLOVENT DISKUS) 50 MCG/BLIST diskus inhaler, Inhale into the lungs., Disp: , Rfl:    folic acid (FOLVITE) 1 MG tablet, One tablet po daily, Disp: 90 tablet, Rfl: 1   linaclotide (LINZESS) 145 MCG CAPS capsule, TK 1 C PO  BEFORE FIRST MEAL OF THE DAY., Disp: 90 capsule, Rfl: 1   Olmesartan-amLODIPine-HCTZ 40-10-25 MG TABS, Take 1 tablet by mouth daily., Disp: 90 tablet, Rfl: 1   Semaglutide, 1 MG/DOSE, (OZEMPIC, 1 MG/DOSE,) 2 MG/1.5ML SOPN, Inject 1 mg into the skin once a week., Disp: 4.5 mL, Rfl: 1   spironolactone (ALDACTONE) 25 MG tablet, Take 1 tablet (25 mg total) by mouth daily., Disp: 30 tablet, Rfl: 11   Vitamin D, Ergocalciferol, (DRISDOL) 1.25 MG (50000 UNIT) CAPS capsule, Take 1 capsule (50,000 Units total) by mouth every 7 (seven) days., Disp: 12 capsule, Rfl: 0  Current Facility-Administered Medications:    hydrochlorothiazide (HYDRODIURIL) tablet 25 mg, 25 mg, Oral, Daily, Glendale Chard, MD  No Known Allergies   Review of Systems  Constitutional: Negative.   Eyes:  Negative for blurred vision.  Respiratory: Negative.    Cardiovascular: Negative.   Gastrointestinal: Negative.   Neurological: Negative.   Psychiatric/Behavioral: Negative.       Today's Vitals   12/25/21 0840 12/25/21 0858  BP: (!) 140/88 (!) 148/88  Pulse: 87   Temp: 98.3 F (36.8 C)   TempSrc: Oral   Weight:  260 lb 9.6 oz (118.2 kg)   Height: 5' (1.524 m)   PainSc: 0-No pain    Body mass index is 50.89 kg/m.  Wt Readings from Last 3 Encounters:  12/25/21 260 lb 9.6 oz (118.2 kg)  11/13/21 263 lb (119.3 kg)  10/03/21 263 lb (119.3 kg)    Objective:  Physical Exam Vitals and nursing note reviewed.  Constitutional:      Appearance: Normal appearance.  HENT:     Head: Normocephalic and atraumatic.     Nose:     Comments: Masked     Mouth/Throat:     Comments: Masked  Eyes:     Extraocular Movements: Extraocular movements intact.  Cardiovascular:     Rate and Rhythm: Normal rate and regular rhythm.     Heart sounds: Normal heart sounds.  Pulmonary:     Effort: Pulmonary effort is normal.     Breath sounds: Normal breath sounds.  Skin:    General: Skin is warm.  Neurological:     General: No focal deficit present.     Mental Status: She is alert.  Psychiatric:        Mood and Affect: Mood normal.        Behavior: Behavior normal.      Assessment And Plan:     1. Essential hypertension, benign Comments: Chronic, uncontrolled. She will c/w generic Tribenzor 40/10/25mg daily. I will add spironolactone to her regimen, take at lunch. F/u 1 wk, check BMP then. - BMP8+eGFR - Amb Referral To Provider Referral Exercise Program (P.R.E.P) - BMP8+EGFR; Future  2. Uncontrolled type 2 diabetes mellitus with hyperglycemia (HCC) Comments: Chronic, I will check labs as below. She is encouraged to limit her intake of refined sugars. I will adjust meds if needed. - Hemoglobin A1c - Amb Referral To Provider Referral Exercise Program (P.R.E.P)  3. OSA (obstructive sleep apnea) Comments: Unfortunately, she has yet to receive her CPAP machine. This is likely contributing to her uncontrolled HTN. Advised to sleep on her side.  4. Class 3 severe obesity due to excess calories with serious comorbidity and body mass index (BMI) of 45.0 to 49.9 in adult [facility]) Comments: She is encouraged to aim  for at least 150 minutes of exercise per week. She is agreeable to PREP referral. - Amb Referral To Provider Referral Exercise Program (P.R.E.P)    Patient was given opportunity to ask questions. Patient verbalized understanding of the plan and was able to repeat key elements of the plan. All questions were answered to their satisfaction.   I, Maximino Greenland, MD, have reviewed all documentation for this visit. The documentation on 12/25/21 for the exam, diagnosis, procedures, and orders are all accurate and complete.   IF YOU HAVE BEEN REFERRED TO A SPECIALIST, IT MAY TAKE 1-2 WEEKS TO SCHEDULE/PROCESS THE REFERRAL. IF YOU HAVE NOT HEARD FROM US/SPECIALIST IN TWO WEEKS, PLEASE GIVE [place] A CALL AT [phone] X [phone].   THE PATIENT IS ENCOURAGED TO PRACTICE SOCIAL DISTANCING DUE TO THE COVID-19 PANDEMIC.

## 2021-12-28 ENCOUNTER — Telehealth: Payer: Self-pay

## 2021-12-28 NOTE — Telephone Encounter (Signed)
Call back received from pt. Returned call.  Patient is very interested in starting PREP. Closest to [employer]. Prefers 1pm start T/Th class. Will plan on calling her closer to start of class to confirm start date and do intake. Pt agreeable. Has my number for call back.

## 2021-12-28 NOTE — Telephone Encounter (Signed)
Call to pt reference PREP referral. Requested call back to discuss

## 2022-01-03 ENCOUNTER — Other Ambulatory Visit

## 2022-01-03 ENCOUNTER — Ambulatory Visit

## 2022-01-03 ENCOUNTER — Other Ambulatory Visit: Payer: Self-pay

## 2022-01-03 VITALS — BP 132/80 | HR 81 | Temp 98.1°F | Ht 60.0 in | Wt 260.0 lb

## 2022-01-03 DIAGNOSIS — I10 Essential (primary) hypertension: Secondary | ICD-10-CM

## 2022-01-03 NOTE — Patient Instructions (Signed)
Hypertension, Adult ?Hypertension is another name for high blood pressure. High blood pressure forces your heart to work harder to pump blood. This can cause problems over time. ?There are two numbers in a blood pressure reading. There is a top number (systolic) over a bottom number (diastolic). It is best to have a blood pressure that is below 120/80. ?What are the causes? ?The cause of this condition is not known. Some other conditions can lead to high blood pressure. ?What increases the risk? ?Some lifestyle factors can make you more likely to develop high blood pressure: ?Smoking. ?Not getting enough exercise or physical activity. ?Being overweight. ?Having too much fat, sugar, calories, or salt (sodium) in your diet. ?Drinking too much alcohol. ?Other risk factors include: ?Having any of these conditions: ?Heart disease. ?Diabetes. ?High cholesterol. ?Kidney disease. ?Obstructive sleep apnea. ?Having a family history of high blood pressure and high cholesterol. ?Age. The risk increases with age. ?Stress. ?What are the signs or symptoms? ?High blood pressure may not cause symptoms. Very high blood pressure (hypertensive crisis) may cause: ?Headache. ?Fast or uneven heartbeats (palpitations). ?Shortness of breath. ?Nosebleed. ?Vomiting or feeling like you may vomit (nauseous). ?Changes in how you see. ?Very bad chest pain. ?Feeling dizzy. ?Seizures. ?How is this treated? ?This condition is treated by making healthy lifestyle changes, such as: ?Eating healthy foods. ?Exercising more. ?Drinking less alcohol. ?Your doctor may prescribe medicine if lifestyle changes do not help enough and if: ?Your top number is above 130. ?Your bottom number is above 80. ?Your personal target blood pressure may vary. ?Follow these instructions at home: ?Eating and drinking ? ?If told, follow the DASH eating plan. To follow this plan: ?Fill one half of your plate at each meal with fruits and vegetables. ?Fill one fourth of your plate  at each meal with whole grains. Whole grains include whole-wheat pasta, brown rice, and whole-grain bread. ?Eat or drink low-fat dairy products, such as skim milk or low-fat yogurt. ?Fill one fourth of your plate at each meal with low-fat (lean) proteins. Low-fat proteins include fish, chicken without skin, eggs, beans, and tofu. ?Avoid fatty meat, cured and processed meat, or chicken with skin. ?Avoid pre-made or processed food. ?Limit the amount of salt in your diet to less than 1,500 mg each day. ?Do not drink alcohol if: ?Your doctor tells you not to drink. ?You are pregnant, may be pregnant, or are planning to become pregnant. ?If you drink alcohol: ?Limit how much you have to: ?0-1 drink a day for women. ?0-2 drinks a day for men. ?Know how much alcohol is in your drink. In the [place] one drink equals one 12 oz bottle of beer (355 mL), one 5 oz glass of wine (148 mL), or one 1? oz glass of hard liquor (44 mL). ?Lifestyle ? ?Work with your doctor to stay at a healthy weight or to lose weight. Ask your doctor what the best weight is for you. ?Get at least 30 minutes of exercise that causes your heart to beat faster (aerobic exercise) most days of the week. This may include walking, swimming, or biking. ?Get at least 30 minutes of exercise that strengthens your muscles (resistance exercise) at least 3 days a week. This may include lifting weights or doing Pilates. ?Do not smoke or use any products that contain nicotine or tobacco. If you need help quitting, ask your doctor. ?Check your blood pressure at home as told by your doctor. ?Keep all follow-up visits. ?Medicines ?Take over-the-counter and prescription medicines   only as told by your doctor. Follow directions carefully. ?Do not skip doses of blood pressure medicine. The medicine does not work as well if you skip doses. Skipping doses also puts you at risk for problems. ?Ask your doctor about side effects or reactions to medicines that you should watch  for. ?Contact a doctor if: ?You think you are having a reaction to the medicine you are taking. ?You have headaches that keep coming back. ?You feel dizzy. ?You have swelling in your ankles. ?You have trouble with your vision. ?Get help right away if: ?You get a very bad headache. ?You start to feel mixed up (confused). ?You feel weak or numb. ?You feel faint. ?You have very bad pain in your: ?Chest. ?Belly (abdomen). ?You vomit more than once. ?You have trouble breathing. ?These symptoms may be an emergency. Get help right away. Call 911. ?Do not wait to see if the symptoms will go away. ?Do not drive yourself to the hospital. ?Summary ?Hypertension is another name for high blood pressure. ?High blood pressure forces your heart to work harder to pump blood. ?For most people, a normal blood pressure is less than 120/80. ?Making healthy choices can help lower blood pressure. If your blood pressure does not get lower with healthy choices, you may need to take medicine. ?This information is not intended to replace advice given to you by your health care provider. Make sure you discuss any questions you have with your health care provider. ?Document Revised: 11/02/2020 Document Reviewed: 11/02/2020 ?Elsevier Patient Education ? 2023 Elsevier Inc. ? ?

## 2022-01-03 NOTE — Progress Notes (Signed)
Pt presents today for bpc. She currently takes HCTZ- 25mg  around 12:00pm & olmesartan- amlodipine-HCTZ 40-10-25 in the afternoon which she has not taken yet & sprinolcatone 25MG  in the morning. She drinks 5 bottles of water a day & exercises regularly. She has brought in her at home bp cuff, reading came back: 181/86, an older machine. Patient told to change out batteries.  Patient aware, per provider STOP HCTZ 25MG . Patient should only take olmesartan- amlodipine-HCTZ 40-10-25 & Spirolactone 25MG . She will come back in 2 weeks for nurse visit. Appointment scheduled.

## 2022-01-04 LAB — BMP8+EGFR
BUN/Creatinine Ratio: 11 — ABNORMAL LOW (ref 12–28)
BUN: 11 mg/dL (ref 8–27)
CO2: 26 mmol/L (ref 20–29)
Calcium: 9.4 mg/dL (ref 8.7–10.3)
Chloride: 101 mmol/L (ref 96–106)
Creatinine, Ser: 0.97 mg/dL (ref 0.57–1.00)
Glucose: 219 mg/dL — ABNORMAL HIGH (ref 70–99)
Potassium: 4.1 mmol/L (ref 3.5–5.2)
Sodium: 141 mmol/L (ref 134–144)
eGFR: 66 mL/min/{1.73_m2} (ref >59)

## 2022-01-08 ENCOUNTER — Ambulatory Visit: Payer: Self-pay | Admitting: Internal Medicine

## 2022-01-16 ENCOUNTER — Other Ambulatory Visit: Payer: Self-pay

## 2022-01-16 MED ORDER — XIGDUO XR 5-1000 MG PO TB24: ORAL_TABLET | ORAL | 1 refills | Status: DC

## 2022-01-17 ENCOUNTER — Ambulatory Visit (INDEPENDENT_AMBULATORY_CARE_PROVIDER_SITE_OTHER)

## 2022-01-17 VITALS — BP 130/84 | HR 72 | Temp 98.1°F | Ht 60.0 in | Wt 260.0 lb

## 2022-01-17 DIAGNOSIS — I10 Essential (primary) hypertension: Secondary | ICD-10-CM

## 2022-01-17 NOTE — Progress Notes (Signed)
Pt presents today for bpc. She takes olmesartan-amlodipine-HCTZ 40-10-12.5 taken in the morning. Denies dizziness, chest pain, SOB. She has had 3 1/2 bottles of water today. She has been exercising since last nurse visit. For today she has had oatmeal & pineapple. For lunch she will eat tuna & salad. BP Readings from Last 3 Encounters:  01/17/22 130/84  01/03/22 132/80  12/25/21 (!) 148/88  Patient will follow up at next scheduled appointment on 03/07/2022.

## 2022-01-22 ENCOUNTER — Telehealth: Payer: Self-pay | Admitting: Neurology

## 2022-01-22 NOTE — Telephone Encounter (Signed)
Marisa [name], see phone note from 12/06/21:  Called pt back. She received fax about CPAP from [facility]. They are needing [place] to send our recommendation about getting her set up with CPAP to fax# [phone].  Put [id number].  Husband ID: [id number]. We need to make sure to include [name]. Advised we will get this faxed over for her. She verbalized understanding.    I faxed CPAP order, office note, sleep study, insurance/demographics to fax# above. Received fax confirmation.      [facility] to [facility] are [facility] in their process to get her set up with CPAP.

## 2022-01-22 NOTE — Telephone Encounter (Signed)
I read the note to her when I spoke with her on the phone earlier. I called her again and told her that she needs to [facility] with [facility] for [facility] and pt is upset and stated that our office is "messy and disorganized" and she does not feel that it is her responsibility to reach out to the [place] for her cpap because no one from our office reached out to her to tell her that. Pt also stated that she spoke with the [facility] a couple weeks ago and was informed that they were running behind. I informed pt that after we sent our information over to the [place] that it is their responsibility and that if she has not heard from them then she would need to reach out.

## 2022-01-22 NOTE — Telephone Encounter (Signed)
Pt stated during appointment reminder call that she does not have the cpap for her appointment and did not see a need in the appointment if she does not have a cpap. Pt would like a call to discuss why she has not received any updates of when she will get her cpap

## 2022-01-23 ENCOUNTER — Ambulatory Visit: Admitting: Neurology

## 2022-01-23 NOTE — Telephone Encounter (Signed)
Noted  

## 2022-01-30 ENCOUNTER — Ambulatory Visit: Admitting: Neurology

## 2022-02-15 ENCOUNTER — Other Ambulatory Visit: Payer: Self-pay

## 2022-02-15 DIAGNOSIS — E1165 Type 2 diabetes mellitus with hyperglycemia: Secondary | ICD-10-CM

## 2022-02-15 MED ORDER — ACCU-CHEK GUIDE ME W/DEVICE KIT: PACK | 1 refills | Status: AC

## 2022-02-15 MED ORDER — ACCU-CHEK SOFTCLIX LANCETS MISC: 2 refills | Status: DC

## 2022-02-15 MED ORDER — ACCU-CHEK GUIDE VI STRP: ORAL_STRIP | 2 refills | Status: DC

## 2022-02-15 NOTE — Progress Notes (Signed)
Call to pt reference next class for PREP starting on 03/11/22 LM requesting call back to discuss participating.

## 2022-02-20 ENCOUNTER — Other Ambulatory Visit: Payer: Self-pay | Admitting: Internal Medicine

## 2022-02-20 ENCOUNTER — Other Ambulatory Visit: Payer: Self-pay

## 2022-02-20 DIAGNOSIS — R87610 Atypical squamous cells of undetermined significance on cytologic smear of cervix (ASC-US): Secondary | ICD-10-CM

## 2022-02-20 DIAGNOSIS — I10 Essential (primary) hypertension: Secondary | ICD-10-CM

## 2022-02-20 DIAGNOSIS — K5909 Other constipation: Secondary | ICD-10-CM

## 2022-02-20 MED ORDER — ATORVASTATIN CALCIUM 40 MG PO TABS: ORAL_TABLET | ORAL | 1 refills | Status: DC

## 2022-02-20 MED ORDER — OLMESARTAN-AMLODIPINE-HCTZ 40-10-25 MG PO TABS: 1.0000 | ORAL_TABLET | Freq: Every day | ORAL | 1 refills | Status: DC

## 2022-02-20 MED ORDER — LINACLOTIDE 145 MCG PO CAPS: ORAL_CAPSULE | ORAL | 1 refills | Status: DC

## 2022-02-20 MED ORDER — METFORMIN HCL 1000 MG PO TABS: 1000.0000 mg | ORAL_TABLET | Freq: Two times a day (BID) | ORAL | 1 refills | Status: DC

## 2022-02-20 MED ORDER — FOLIC ACID 1 MG PO TABS: ORAL_TABLET | ORAL | 1 refills | Status: DC

## 2022-02-20 MED ORDER — DAPAGLIFLOZIN PROPANEDIOL 10 MG PO TABS: 10.0000 mg | ORAL_TABLET | Freq: Every day | ORAL | 2 refills | Status: DC

## 2022-03-01 ENCOUNTER — Telehealth: Payer: Self-pay

## 2022-03-01 NOTE — Telephone Encounter (Signed)
Call to pt reference next PREP class starting on 03/11/22 at [place] 230p-345p.  Verified that schedule works.  Intake scheduled for 03/04/22 at 315p at [facility]. Will meet her at front desk.

## 2022-03-05 NOTE — Progress Notes (Signed)
[facility] PREP Evaluation  Patient Details  Name: Marisa [name] MRN: [id number] Date of Birth: 1961-01-13 Age: 62 y.o. PCP: Glendale Chard, MD  Vitals:   03/05/22 1630  BP: (!) 138/100  Pulse: 85  SpO2: 99%  Weight: 261 lb 3.2 oz (118.5 kg)     [facility] Eval - 03/05/22 1700       Referral    Referring Provider [facility]    Reason for referral Hypertension;Diabetes;High Cholesterol    Program Start Date 03/11/22   [facility] 230p-345pm x 12 wks     Measurement   Waist Circumference 50 inches    Hip Circumference 50.5 inches    Body fat 49.2 percent      Information for Trainer   Goals Strength training, wt loss of 20 lbs    Current Exercise occasionally-has ankle/knee swelling    Orthopedic Concerns Left ankle and knee, hx of right shoulder surgery    Pertinent Medical History HTN, OSA, DM2, inc chol    Current Barriers none    Restrictions/Precautions Diabetic snack before exercise   no falls   Medications that affect exercise Medication causing dizziness/drowsiness      Timed Up and Go (TUGS)   Timed Up and Go Low risk <9 seconds      Mobility and Daily Activities   I find it easy to walk up or down two or more flights of stairs. 1    I have no trouble taking out the trash. 4    I do housework such as vacuuming and dusting on my own without difficulty. 4    I can easily lift a gallon of milk (8lbs). 4    I can easily walk a mile. 4    I have no trouble reaching into high cupboards or reaching down to pick up something from the floor. 4    I do not have trouble doing out-door work such as Armed forces logistics/support/administrative officer, raking leaves, or gardening. 4      Mobility and Daily Activities   I feel younger than my age. 4    I feel independent. 2    I feel energetic. 2    I live an active life.  2    I feel strong. 4    I feel healthy. 4    I feel active as other people my age. 2      How fit and strong are you.   Fit and Strong Total Score 45            Past Medical History:   Diagnosis Date   Borderline diabetes    Hypertension    Past Surgical History:  Procedure Laterality Date   ABDOMINAL HYSTERECTOMY     CESAREAN SECTION     SHOULDER SURGERY     Social History   Tobacco Use  Smoking Status Never  Smokeless Tobacco Never    Barnett Hatter 03/05/2022, 5:18 PM

## 2022-03-07 ENCOUNTER — Ambulatory Visit (INDEPENDENT_AMBULATORY_CARE_PROVIDER_SITE_OTHER): Admitting: Internal Medicine

## 2022-03-07 ENCOUNTER — Encounter: Payer: Self-pay | Admitting: Internal Medicine

## 2022-03-07 VITALS — BP 134/88 | HR 82 | Temp 97.9°F | Ht 60.0 in | Wt 253.2 lb

## 2022-03-07 DIAGNOSIS — Z6841 Body Mass Index (BMI) 40.0 and over, adult: Secondary | ICD-10-CM | POA: Diagnosis not present

## 2022-03-07 DIAGNOSIS — E1165 Type 2 diabetes mellitus with hyperglycemia: Secondary | ICD-10-CM

## 2022-03-07 DIAGNOSIS — I10 Essential (primary) hypertension: Secondary | ICD-10-CM

## 2022-03-07 NOTE — Progress Notes (Signed)
I,Victoria T Hamilton,acting as a scribe for Maximino Greenland, MD.,have documented all relevant documentation on the behalf of Maximino Greenland, MD,as directed by  Maximino Greenland, MD while in the presence of Maximino Greenland, MD.    Subjective:     Patient ID: Marisa [name] , female    DOB: 05/07/1960 , 62 y.o.   MRN: [id number]   Chief Complaint  Patient presents with   Diabetes   Hypertension    HPI  She is here today for diabetes and BP f/u.  She reports compliance with meds.  She denies headaches, chest pain and shortness of breath. Patient states she has been working out everyday at the [employer].  Patient states she forgot her med list at home. She cannot remember what medications she is taking, she is waiting for some of her meds to come in the mail.          Diabetes She presents for her follow-up diabetic visit. She has type 2 diabetes mellitus. Her disease course has been stable. There are no hypoglycemic associated symptoms. Pertinent negatives for diabetes include no blurred vision. There are no hypoglycemic complications. Risk factors for coronary artery disease include diabetes mellitus, hypertension, sedentary lifestyle, post-menopausal, dyslipidemia and obesity. She is following a diabetic diet. She does not see a podiatrist. Hypertension This is a chronic problem. The current episode started more than 1 year ago. The problem has been gradually improving since onset. The problem is uncontrolled. Pertinent negatives include no blurred vision. Risk factors for coronary artery disease include diabetes mellitus, dyslipidemia, obesity, post-menopausal state and sedentary lifestyle. The current treatment provides moderate improvement. Compliance problems include exercise.       Past Medical History:  Diagnosis Date   Borderline diabetes    Hypertension      Family History  Problem Relation Age of Onset   Diabetes Mother    Hypertension Mother      Current  Outpatient Medications:    Accu-Chek Softclix Lancets lancets, Use as instructed to check blood sugars three time daily E11.65, Disp: 100 each, Rfl: 2   atorvastatin (LIPITOR) 40 MG tablet, Take one tablet by mouth everyday except sundays, Disp: 90 tablet, Rfl: 1   Blood Glucose Monitoring Suppl (ACCU-CHEK GUIDE ME) w/Device KIT, Use as directed to check blood sugars three times daily E11.65, Disp: 1 kit, Rfl: 1   linaclotide (LINZESS) 145 MCG CAPS capsule, TK 1 C PO  BEFORE FIRST MEAL OF THE DAY., Disp: 90 capsule, Rfl: 1   Olmesartan-amLODIPine-HCTZ 40-10-25 MG TABS, Take 1 tablet by mouth daily., Disp: 90 tablet, Rfl: 1   Semaglutide, 1 MG/DOSE, (OZEMPIC, 1 MG/DOSE,) 2 MG/1.5ML SOPN, Inject 1 mg into the skin once a week., Disp: 4.5 mL, Rfl: 1   dapagliflozin propanediol (FARXIGA) 10 MG TABS tablet, Take 1 tablet (10 mg total) by mouth daily before breakfast. (Patient not taking: Reported on 03/07/2022), Disp: 90 tablet, Rfl: 2   fluticasone (FLOVENT DISKUS) 50 MCG/BLIST diskus inhaler, Inhale into the lungs. (Patient not taking: Reported on 03/07/2022), Disp: , Rfl:    folic acid (FOLVITE) 1 MG tablet, One tablet po daily (Patient not taking: Reported on 03/07/2022), Disp: 90 tablet, Rfl: 1   glucose blood (ACCU-CHEK GUIDE) test strip, Use as instructed to check blood sugars three times daily E11.65 (Patient not taking: Reported on 03/07/2022), Disp: 100 each, Rfl: 2   metFORMIN (GLUCOPHAGE) 1000 MG tablet, Take 1 tablet (1,000 mg total) by mouth 2 (two) times  daily with a meal. (Patient not taking: Reported on 03/07/2022), Disp: 180 tablet, Rfl: 1   spironolactone (ALDACTONE) 25 MG tablet, Take 1 tablet (25 mg total) by mouth daily. (Patient not taking: Reported on 01/17/2022), Disp: 30 tablet, Rfl: 11   Vitamin D, Ergocalciferol, (DRISDOL) 1.25 MG (50000 UNIT) CAPS capsule, Take 1 capsule (50,000 Units total) by mouth every 7 (seven) days. (Patient not taking: Reported on 03/07/2022), Disp: 12 capsule, Rfl: 0    No Known Allergies   Review of Systems  Constitutional: Negative.   Eyes:  Negative for blurred vision.  Respiratory: Negative.    Cardiovascular: Negative.   Neurological: Negative.   Psychiatric/Behavioral: Negative.       Today's Vitals   03/07/22 1513 03/07/22 1540  BP: (!) 136/90 134/88  Pulse: 82   Temp: 97.9 F (36.6 C)   SpO2: 98%   Weight: 253 lb 3.2 oz (114.9 kg)   Height: 5' (1.524 m)    Body mass index is 49.45 kg/m.  Wt Readings from Last 3 Encounters:  03/07/22 253 lb 3.2 oz (114.9 kg)  03/05/22 261 lb 3.2 oz (118.5 kg)  01/17/22 260 lb (117.9 kg)    Objective:  Physical Exam Vitals and nursing note reviewed.  Constitutional:      Appearance: Normal appearance.  HENT:     Head: Normocephalic and atraumatic.     Nose:     Comments: Masked     Mouth/Throat:     Comments: Masked  Eyes:     Extraocular Movements: Extraocular movements intact.  Cardiovascular:     Rate and Rhythm: Normal rate and regular rhythm.     Heart sounds: Normal heart sounds.  Pulmonary:     Effort: Pulmonary effort is normal.     Breath sounds: Normal breath sounds.  Musculoskeletal:     Right lower leg: Edema present.     Left lower leg: Edema present.  Skin:    General: Skin is warm.  Neurological:     General: No focal deficit present.     Mental Status: She is alert.  Psychiatric:        Mood and Affect: Mood normal.        Behavior: Behavior normal.      Assessment And Plan:     1. Uncontrolled type 2 diabetes mellitus with hyperglycemia (HCC) Comments: Chronic, I will check labs as below. Unfortunately, I believe compliance is still an issue. She is supposed to be taking metformin, Farxiga and Ozempic. - CBC - Hemoglobin A1c  2. Essential hypertension, benign Comments: Fair control. I question medication compliance, encouraged to bring all meds to next appt. No med changes today, encouraged to follow low sodium diet. - CBC - Hemoglobin A1c  3. Class 3  severe obesity due to excess calories with serious comorbidity and body mass index (BMI) of 45.0 to 49.9 in adult [facility]) Comments: She was congratulated on her 7lb weight loss since December 2023. She is encouraged to aim for at least 150 minutes of exercise per week.  Patient was given opportunity to ask questions. Patient verbalized understanding of the plan and was able to repeat key elements of the plan. All questions were answered to their satisfaction.   I, Maximino Greenland, MD, have reviewed all documentation for this visit. The documentation on 03/07/22 for the exam, diagnosis, procedures, and orders are all accurate and complete.   IF YOU HAVE BEEN REFERRED TO A SPECIALIST, IT MAY TAKE 1-2 WEEKS TO SCHEDULE/PROCESS THE  REFERRAL. IF YOU HAVE NOT HEARD FROM US/SPECIALIST IN TWO WEEKS, PLEASE GIVE [place] A CALL AT [phone].   THE PATIENT IS ENCOURAGED TO PRACTICE SOCIAL DISTANCING DUE TO THE COVID-19 PANDEMIC.

## 2022-03-07 NOTE — Patient Instructions (Signed)

## 2022-03-08 LAB — CBC
Hematocrit: 42.8 % (ref 34.0–46.6)
Hemoglobin: 13.5 g/dL (ref 11.1–15.9)
MCH: 22.2 pg — ABNORMAL LOW (ref 26.6–33.0)
MCHC: 31.5 g/dL (ref 31.5–35.7)
MCV: 70 fL — ABNORMAL LOW (ref 79–97)
Platelets: 301 10*3/uL (ref 150–450)
RBC: 6.08 x10E6/uL — ABNORMAL HIGH (ref 3.77–5.28)
RDW: 17.1 % — ABNORMAL HIGH (ref 11.7–15.4)
WBC: 6.6 10*3/uL (ref 3.4–10.8)

## 2022-03-08 LAB — HEMOGLOBIN A1C
Est. average glucose Bld gHb Est-mCnc: 171 mg/dL
Hgb A1c MFr Bld: 7.6 % — ABNORMAL HIGH (ref 4.8–5.6)

## 2022-03-10 ENCOUNTER — Other Ambulatory Visit: Payer: Self-pay | Admitting: Internal Medicine

## 2022-03-10 DIAGNOSIS — R0601 Orthopnea: Secondary | ICD-10-CM

## 2022-03-11 ENCOUNTER — Other Ambulatory Visit: Payer: Self-pay

## 2022-03-11 DIAGNOSIS — Z79899 Other long term (current) drug therapy: Secondary | ICD-10-CM

## 2022-03-11 DIAGNOSIS — E559 Vitamin D deficiency, unspecified: Secondary | ICD-10-CM

## 2022-03-13 NOTE — Progress Notes (Signed)
[facility] PREP Weekly Session  Patient Details  Name: Marisa [name] MRN: [id number] Date of Birth: 1960-01-31 Age: 62 y.o. PCP: Glendale Chard, MD  There were no vitals filed for this visit.   [facility] Weekly seesion - 03/13/22 1500       [facility] "PREP" Location   [facility] "PREP" Location [facility]      Weekly Session   Topic Discussed Goal setting and welcome to the program   scale of perceived exertion, fit testing   Classes attended to date 2             Barnett Hatter 03/13/2022, 3:54 PM

## 2022-03-14 ENCOUNTER — Other Ambulatory Visit

## 2022-03-19 NOTE — Progress Notes (Signed)
[facility] PREP Weekly Session  Patient Details  Name: OZELLE MILK MRN: [id number] Date of Birth: [birth year] Age: 62 y.o. PCP: Glendale Chard, MD  Vitals:   03/18/22 1430  Weight: 256 lb 6.4 oz (116.3 kg)     [facility] Weekly seesion - 03/19/22 1700       [facility] "PREP" Location   [facility]PREP[facility] Location [facility]      Weekly Session   Topic Discussed Other ways to be active;Importance of resistance training    Minutes exercised this week 30 minutes    Classes attended to date [place] 03/19/2022, 5:07 PM

## 2022-03-21 ENCOUNTER — Other Ambulatory Visit

## 2022-03-26 ENCOUNTER — Other Ambulatory Visit

## 2022-04-01 ENCOUNTER — Other Ambulatory Visit: Payer: Self-pay

## 2022-04-01 DIAGNOSIS — E559 Vitamin D deficiency, unspecified: Secondary | ICD-10-CM

## 2022-04-01 MED ORDER — VITAMIN D (ERGOCALCIFEROL) 1.25 MG (50000 UNIT) PO CAPS: 50000.0000 [IU] | ORAL_CAPSULE | ORAL | 0 refills | Status: DC

## 2022-04-02 NOTE — Progress Notes (Signed)
[facility] PREP Weekly Session  Patient Details  Name: MONIGUE HARKEY MRN: [id number] Date of Birth: 12-20-1960 Age: 62 y.o. PCP: Glendale Chard, MD  Vitals:   04/01/22 1430  Weight: 248 lb 12.8 oz (112.9 kg)     [facility] Weekly seesion - 04/02/22 1600       [facility] "PREP" Location   [facility] "PREP[facility] Location [facility]      Weekly Session   Topic Discussed Health habits    Minutes exercised this week 840 minutes    Classes attended to date [place] 04/02/2022, 4:53 PM

## 2022-04-08 ENCOUNTER — Other Ambulatory Visit

## 2022-04-08 DIAGNOSIS — Z79899 Other long term (current) drug therapy: Secondary | ICD-10-CM

## 2022-04-08 LAB — BMP8+EGFR
BUN/Creatinine Ratio: 11 — ABNORMAL LOW (ref 12–28)
BUN: 10 mg/dL (ref 8–27)
CO2: 26 mmol/L (ref 20–29)
Calcium: 9.4 mg/dL (ref 8.7–10.3)
Chloride: 96 mmol/L (ref 96–106)
Creatinine, Ser: 0.91 mg/dL (ref 0.57–1.00)
Glucose: 104 mg/dL — ABNORMAL HIGH (ref 70–99)
Potassium: 3.4 mmol/L — ABNORMAL LOW (ref 3.5–5.2)
Sodium: 138 mmol/L (ref 134–144)
eGFR: 71 mL/min/{1.73_m2} (ref >59)

## 2022-04-16 ENCOUNTER — Telehealth: Payer: Self-pay | Admitting: Neurology

## 2022-04-16 ENCOUNTER — Ambulatory Visit

## 2022-04-16 DIAGNOSIS — R0601 Orthopnea: Secondary | ICD-10-CM

## 2022-04-16 NOTE — Telephone Encounter (Signed)
Received a call today from the sleep lab, " Patient left a voicemail on my phone stating that she had her SS in September 2023 and she stated that she has not received her breathing machine yet and would like a call back.

## 2022-04-16 NOTE — Telephone Encounter (Signed)
I have copied and pasted this call to the call that was on file for the patient from dec where patient was advised to have called the [place]. Routed to phone room to call the patient and inform her to contact the [place]

## 2022-04-16 NOTE — Progress Notes (Signed)
[facility] PREP Weekly Session  Patient Details  Name: Marisa [name] MRN: [id number] Date of Birth: 19-Jun-1960 Age: 62 y.o. PCP: Glendale Chard, MD  Vitals:   04/15/22 1430  Weight: 245 lb (111.1 kg)     [facility] Weekly seesion - 04/16/22 1200       [facility] "PREP" Location   [facility]PREP" [facility]      Weekly Session   Topic Discussed Stress management and problem solving    Minutes exercised this week 460 minutes    Classes attended to date [place] 04/16/2022, 12:25 PM

## 2022-04-16 NOTE — Telephone Encounter (Signed)
Patient left a voicemail on my phone stating that she had her SS in September 2023 and she stated that she has not received her breathing machine yet and would like a call back.

## 2022-04-17 NOTE — Telephone Encounter (Signed)
Called pt. Informed her of message nurse Myriam Jacobson sent that she would have to go through [place] to get set up. Pt said she will call the [place] again, Pt said thanks for call.

## 2022-04-17 NOTE — Telephone Encounter (Signed)
Stacey from [facility] called. Stated pt needs to go through [place] to get set-up because they are not allowed to bill the [place].

## 2022-04-17 NOTE — Telephone Encounter (Signed)
I called pt. Stated she called the [place] and she was told our office could send the order to an [place] and they could bill the [place].

## 2022-04-17 NOTE — Telephone Encounter (Signed)
I have forwarded the orders for the patient to [facility] to inquire on this. We have always been advised that patients with VA insurance must go through [place]. I will await to hear back from [place]. I requested that [facility] reach out to pt if they are unable to get her set up so she can hear from [place] as well.

## 2022-04-25 NOTE — Progress Notes (Signed)
[facility] PREP Weekly Session  Patient Details  Name: FANI HAYLEY MRN: [id number] Date of Birth: Nov 17, 1960 Age: 62 y.o. PCP: Glendale Chard, MD  Vitals:     [facility] Weekly seesion - 04/25/22 1100       [facility] "PREP" Location   [facility]PREP" [facility]      Weekly Session   Topic Discussed Expectations and non-scale victories    Minutes exercised this week 180 minutes    Classes attended to date [place] 04/25/2022, 11:46 AM

## 2022-04-26 LAB — HM MAMMOGRAPHY: HM Mammogram: NORMAL (ref 0–4)

## 2022-04-30 NOTE — Progress Notes (Signed)
[facility] PREP Weekly Session  Patient Details  Name: Marisa [name] MRN: [id number] Date of Birth: Feb 16, 1960 Age: 62 y.o. PCP: Glendale Chard, MD  Vitals:   04/29/22 1128  Weight: 250 lb 3.2 oz (113.5 kg)     [facility] Weekly seesion - 04/30/22 1100       [facility] "PREP" Location   [facility]PREP[facility] Location [facility]      Weekly Session   Topic Discussed --   portions   Minutes exercised this week --   no min recorded, sts getting 10K steps   Classes attended to date 12           Class held on 04/29/22   Barnett Hatter 04/30/2022, 11:30 AM

## 2022-05-14 NOTE — Progress Notes (Signed)
[facility] PREP Weekly Session  Patient Details  Name: Marisa [name] MRN: [id number] Date of Birth: 04-14-1960 Age: 62 y.o. PCP: Dorothyann Peng, MD  Vitals:   05/13/22 1430  Weight: 250 lb (113.4 kg)     [facility] Weekly seesion - 05/14/22 1700       [facility] "PREP" Location   [facility]PREP[facility] Location [facility]      Weekly Session   Topic Discussed Calorie breakdown    Classes attended to date 15             Marisa [name] 05/14/2022, 5:37 PM

## 2022-05-22 NOTE — Progress Notes (Signed)
[facility] PREP Weekly Session  Patient Details  Name: Marisa [name] MRN: [id number] Date of Birth: [birth year] Age: 62 y.o. PCP: Dorothyann Peng, MD  Vitals:   05/20/22 1430  Weight: 254 lb (115.2 kg)     [facility] Weekly seesion - 05/22/22 1600       [facility] "PREP" Location   [facility]" [facility]      Weekly Session   Topic Discussed Hitting roadblocks    Minutes exercised this week 12 minutes    Classes attended to date 17             Bonnye Fava 05/22/2022, 4:06 PM

## 2022-06-04 ENCOUNTER — Ambulatory Visit (INDEPENDENT_AMBULATORY_CARE_PROVIDER_SITE_OTHER): Admitting: Internal Medicine

## 2022-06-04 ENCOUNTER — Encounter: Payer: Self-pay | Admitting: Internal Medicine

## 2022-06-04 VITALS — BP 138/100 | HR 78 | Temp 98.0°F | Ht 60.0 in | Wt 247.6 lb

## 2022-06-04 DIAGNOSIS — M25562 Pain in left knee: Secondary | ICD-10-CM | POA: Diagnosis not present

## 2022-06-04 DIAGNOSIS — I10 Essential (primary) hypertension: Secondary | ICD-10-CM | POA: Diagnosis not present

## 2022-06-04 DIAGNOSIS — E559 Vitamin D deficiency, unspecified: Secondary | ICD-10-CM

## 2022-06-04 DIAGNOSIS — E7800 Pure hypercholesterolemia, unspecified: Secondary | ICD-10-CM | POA: Diagnosis not present

## 2022-06-04 DIAGNOSIS — Z6841 Body Mass Index (BMI) 40.0 and over, adult: Secondary | ICD-10-CM

## 2022-06-04 DIAGNOSIS — G8929 Other chronic pain: Secondary | ICD-10-CM | POA: Diagnosis not present

## 2022-06-04 DIAGNOSIS — E1165 Type 2 diabetes mellitus with hyperglycemia: Secondary | ICD-10-CM | POA: Diagnosis not present

## 2022-06-04 MED ORDER — KETOROLAC TROMETHAMINE 30 MG/ML IJ SOLN
30.0000 mg | Freq: Once | INTRAMUSCULAR | Status: AC
Start: 2022-06-04 — End: 2022-06-04
  Administered 2022-06-04: 30 mg via INTRAMUSCULAR

## 2022-06-04 MED ORDER — TRAMADOL HCL 50 MG PO TABS: 50.0000 mg | ORAL_TABLET | Freq: Four times a day (QID) | ORAL | 0 refills | Status: DC | PRN

## 2022-06-04 NOTE — Progress Notes (Signed)
[facility] PREP Evaluation  Patient Details  Name: Marisa [name] MRN: [id number] Date of Birth: 1960-05-02 Age: 62 y.o. PCP: Dorothyann Peng, MD  Vitals:   06/03/22 1500  BP: (!) 150/100  Pulse: 94  SpO2: 94%  Weight: 247 lb 3.2 oz (112.1 kg)     [facility] Eval - 06/04/22 1600       [facility] "PREP" Location   [facility]PREP" Location [facility]      Referral    Referring Provider Allyne Gee    Program Start Date 03/11/22    Program End Date 05/29/22      Measurement   Waist Circumference 50 inches    Waist Circumference End Program 47 inches    Hip Circumference 50.5 inches    Hip Circumference End Program 52 inches    Body fat --   attempted x 3, error message     Mobility and Daily Activities   I find it easy to walk up or down two or more flights of stairs. 1    I have no trouble taking out the trash. 4    I do housework such as vacuuming and dusting on my own without difficulty. 4    I can easily lift a gallon of milk (8lbs). 4    I can easily walk a mile. 4    I have no trouble reaching into high cupboards or reaching down to pick up something from the floor. 4    I do not have trouble doing out-door work such as Loss adjuster, chartered, raking leaves, or gardening. 4      Mobility and Daily Activities   I feel younger than my age. 3    I feel independent. 4    I feel energetic. 3    I live an active life.  3    I feel strong. 3    I feel healthy. 2    I feel active as other people my age. 3      How fit and strong are you.   Fit and Strong Total Score 46            Past Medical History:  Diagnosis Date   Borderline diabetes    Hypertension    Past Surgical History:  Procedure Laterality Date   ABDOMINAL HYSTERECTOMY     CESAREAN SECTION     SHOULDER SURGERY     Social History   Tobacco Use  Smoking Status Never  Smokeless Tobacco Never  Attended 17 workouts, and 9 educational sessions Fit testing: Cardio march: 264 to 322 Sit to stand: 7 to  18 Bicep curl: 13 to 28 Balance somewhat improved. Encouraged to continue to exercise, Keep sodium at 1500mg  per day and added sugars <24 per day.  Encouraged f/u for knee. Has PT planned for ankle on same leg.   Bonnye Fava 06/04/2022, 4:57 PM

## 2022-06-04 NOTE — Progress Notes (Signed)
I,Victoria T Hamilton,acting as a scribe for Gwynneth Aliment, MD.,have documented all relevant documentation on the behalf of Gwynneth Aliment, MD,as directed by  Gwynneth Aliment, MD while in the presence of Gwynneth Aliment, MD.   Subjective:     Patient ID: Marisa [name] , female    DOB: 09-20-1960 , 62 y.o.   MRN: [id number]   Chief Complaint  Patient presents with   Hypertension   Diabetes   Hyperlipidemia    HPI  She is here today for diabetes and BP f/u.  She reports compliance with meds.  She denies headaches, chest pain and shortness of breath. Patient states she has been working out everyday at the [employer].       Hypertension This is a chronic problem. The current episode started more than 1 year ago. The problem has been gradually improving since onset. The problem is uncontrolled. Pertinent negatives include no blurred vision, chest pain, headaches, palpitations or shortness of breath. Risk factors for coronary artery disease include diabetes mellitus, dyslipidemia, obesity, post-menopausal state and sedentary lifestyle. The current treatment provides moderate improvement. Compliance problems include exercise.   Diabetes She presents for her follow-up diabetic visit. She has type 2 diabetes mellitus. Her disease course has been stable. There are no hypoglycemic associated symptoms. Pertinent negatives for hypoglycemia include no confusion, dizziness or headaches. Pertinent negatives for diabetes include no blurred vision, no chest pain, no polydipsia, no polyphagia and no weakness. There are no hypoglycemic complications. Risk factors for coronary artery disease include diabetes mellitus, hypertension, sedentary lifestyle, post-menopausal, dyslipidemia and obesity. She is following a diabetic diet. She participates in exercise three times a week. An ACE inhibitor/angiotensin II receptor blocker is being taken. She does not see a podiatrist.Eye exam is current.   Hyperlipidemia This is a chronic problem. The current episode started more than 1 year ago. Exacerbating diseases include diabetes and obesity. Pertinent negatives include no chest pain or shortness of breath. Current antihyperlipidemic treatment includes statins. The current treatment provides moderate improvement of lipids.     Past Medical History:  Diagnosis Date   Borderline diabetes    Hypertension      Family History  Problem Relation Age of Onset   Diabetes Mother    Hypertension Mother      Current Outpatient Medications:    Accu-Chek Softclix Lancets lancets, Use as instructed to check blood sugars three time daily E11.65, Disp: 100 each, Rfl: 2   atorvastatin (LIPITOR) 40 MG tablet, Take one tablet by mouth everyday except sundays, Disp: 90 tablet, Rfl: 1   Blood Glucose Monitoring Suppl (ACCU-CHEK GUIDE ME) w/Device KIT, Use as directed to check blood sugars three times daily E11.65, Disp: 1 kit, Rfl: 1   dapagliflozin propanediol (FARXIGA) 10 MG TABS tablet, Take 1 tablet (10 mg total) by mouth daily before breakfast. (Patient taking differently: Take 10 mg by mouth daily before breakfast. Patient will take once daily.), Disp: 90 tablet, Rfl: 2   fluticasone (FLOVENT DISKUS) 50 MCG/BLIST diskus inhaler, Inhale into the lungs., Disp: , Rfl:    folic acid (FOLVITE) 1 MG tablet, One tablet po daily, Disp: 90 tablet, Rfl: 1   linaclotide (LINZESS) 145 MCG CAPS capsule, TK 1 C PO  BEFORE FIRST MEAL OF THE DAY., Disp: 90 capsule, Rfl: 1   metFORMIN (GLUCOPHAGE) 1000 MG tablet, Take 1 tablet (1,000 mg total) by mouth 2 (two) times daily with a meal. (Patient taking differently: Take 1,000 mg by mouth once.  Patient will take once daily.), Disp: 180 tablet, Rfl: 1   Olmesartan-amLODIPine-HCTZ 40-10-25 MG TABS, Take 1 tablet by mouth daily., Disp: 90 tablet, Rfl: 1   traMADol (ULTRAM) 50 MG tablet, Take 1 tablet (50 mg total) by mouth every 6 (six) hours as needed., Disp: 20 tablet,  Rfl: 0   Vitamin D, Ergocalciferol, (DRISDOL) 1.25 MG (50000 UNIT) CAPS capsule, Take 1 capsule (50,000 Units total) by mouth every 7 (seven) days., Disp: 12 capsule, Rfl: 0   glucose blood (ACCU-CHEK GUIDE) test strip, Use as instructed to check blood sugars three times daily E11.65 (Patient not taking: Reported on 03/07/2022), Disp: 100 each, Rfl: 2   Semaglutide, 1 MG/DOSE, (OZEMPIC, 1 MG/DOSE,) 2 MG/1.5ML SOPN, Inject 1 mg into the skin once a week., Disp: 4.5 mL, Rfl: 1   spironolactone (ALDACTONE) 25 MG tablet, Take 1 tablet (25 mg total) by mouth daily. (Patient not taking: Reported on 01/17/2022), Disp: 30 tablet, Rfl: 11   No Known Allergies    Review of Systems  Constitutional: Negative.   HENT: Negative.    Eyes: Negative.  Negative for blurred vision.  Respiratory: Negative.  Negative for shortness of breath.   Cardiovascular: Negative.  Negative for chest pain and palpitations.  Gastrointestinal: Negative.   Endocrine: Negative for polydipsia and polyphagia.  Musculoskeletal: Negative.   Allergic/Immunologic: Negative.   Neurological:  Negative for dizziness, weakness and headaches.  Psychiatric/Behavioral:  Negative for confusion.      Today's Vitals   06/04/22 1424 06/04/22 1441  BP: (!) 150/110 (!) 138/100  Pulse: 78   Temp: 98 F (36.7 C)   SpO2: 98%   Weight: 247 lb 9.6 oz (112.3 kg)   Height: 5' (1.524 m)    Body mass index is 48.36 kg/m.  Wt Readings from Last 3 Encounters:  06/04/22 247 lb 9.6 oz (112.3 kg)  06/03/22 247 lb 3.2 oz (112.1 kg)  05/20/22 254 lb (115.2 kg)    Objective:  Physical Exam Vitals and nursing note reviewed.  Constitutional:      Appearance: Normal appearance. She is obese.  HENT:     Head: Normocephalic and atraumatic.  Eyes:     Extraocular Movements: Extraocular movements intact.  Cardiovascular:     Rate and Rhythm: Normal rate and regular rhythm.     Heart sounds: Normal heart sounds.  Pulmonary:     Effort: Pulmonary  effort is normal.     Breath sounds: Normal breath sounds.  Musculoskeletal:     Cervical back: Normal range of motion.     Comments: Ambulatory w/ cane  Skin:    General: Skin is warm.  Neurological:     General: No focal deficit present.     Mental Status: She is alert.  Psychiatric:        Mood and Affect: Mood normal.        Behavior: Behavior normal.         Assessment And Plan:     1. Essential hypertension, benign Comments: Chronic, uncontrolled. She will c/w olmesartan/hctz/amlodipine daily. She has not been taking spironolactone, she should resume.  2. Uncontrolled type 2 diabetes mellitus with hyperglycemia (HCC) Comments: Chronic, importance of regular exercise was d/w patient. Encouraged to follow dietary recommendations and decrease intake of processed meats. - CMP14+EGFR - Hemoglobin A1c - Lipid panel - Urine microalbumin-creatinine with uACR - TSH  3. Pure hypercholesterolemia Comments: Chronic, LDL goal < 70.  She will c/w atorvastatin 40mg  daily.  She is encouraged to follow  a heart healthy diet. - CMP14+EGFR - Lipid panel - TSH  4. Chronic pain of left knee Comments: She is followed by Estrellita Ludwig, notes reviewed. She may also benefit from topical Voltaren gel prn. PDMP reviewed, rx tramadol given. - traMADol (ULTRAM) 50 MG tablet; Take 1 tablet (50 mg total) by mouth every 6 (six) hours as needed.  Dispense: 20 tablet; Refill: 0 - ketorolac (TORADOL) 30 MG/ML injection 30 mg  5. Vitamin D deficiency disease Comments: I will check vitamin D level and supplement as needed. - Vitamin D (25 hydroxy)  6. Class 3 severe obesity due to excess calories with serious comorbidity and body mass index (BMI) of 45.0 to 49.9 in adult (HCC) BMI 48. She is encouraged to initially strive for BMI less than 40 to decrease cardiac risk. Advised to aim for at least 150 minutes of exercise per week.    Patient was given opportunity to ask questions. Patient verbalized  understanding of the plan and was able to repeat key elements of the plan. All questions were answered to their satisfaction.   I, Gwynneth Aliment, MD, have reviewed all documentation for this visit. The documentation on 06/04/22 for the exam, diagnosis, procedures, and orders are all accurate and complete.   THE PATIENT IS ENCOURAGED TO PRACTICE SOCIAL DISTANCING DUE TO THE COVID-19 PANDEMIC.

## 2022-06-04 NOTE — Patient Instructions (Addendum)
Hypertension, Adult ?Hypertension is another name for high blood pressure. High blood pressure forces your heart to work harder to pump blood. This can cause problems over time. ?There are two numbers in a blood pressure reading. There is a top number (systolic) over a bottom number (diastolic). It is best to have a blood pressure that is below 120/80. ?What are the causes? ?The cause of this condition is not known. Some other conditions can lead to high blood pressure. ?What increases the risk? ?Some lifestyle factors can make you more likely to develop high blood pressure: ?Smoking. ?Not getting enough exercise or physical activity. ?Being overweight. ?Having too much fat, sugar, calories, or salt (sodium) in your diet. ?Drinking too much alcohol. ?Other risk factors include: ?Having any of these conditions: ?Heart disease. ?Diabetes. ?High cholesterol. ?Kidney disease. ?Obstructive sleep apnea. ?Having a family history of high blood pressure and high cholesterol. ?Age. The risk increases with age. ?Stress. ?What are the signs or symptoms? ?High blood pressure may not cause symptoms. Very high blood pressure (hypertensive crisis) may cause: ?Headache. ?Fast or uneven heartbeats (palpitations). ?Shortness of breath. ?Nosebleed. ?Vomiting or feeling like you may vomit (nauseous). ?Changes in how you see. ?Very bad chest pain. ?Feeling dizzy. ?Seizures. ?How is this treated? ?This condition is treated by making healthy lifestyle changes, such as: ?Eating healthy foods. ?Exercising more. ?Drinking less alcohol. ?Your doctor may prescribe medicine if lifestyle changes do not help enough and if: ?Your top number is above 130. ?Your bottom number is above 80. ?Your personal target blood pressure may vary. ?Follow these instructions at home: ?Eating and drinking ? ?If told, follow the DASH eating plan. To follow this plan: ?Fill one half of your plate at each meal with fruits and vegetables. ?Fill one fourth of your plate  at each meal with whole grains. Whole grains include whole-wheat pasta, brown rice, and whole-grain bread. ?Eat or drink low-fat dairy products, such as skim milk or low-fat yogurt. ?Fill one fourth of your plate at each meal with low-fat (lean) proteins. Low-fat proteins include fish, chicken without skin, eggs, beans, and tofu. ?Avoid fatty meat, cured and processed meat, or chicken with skin. ?Avoid pre-made or processed food. ?Limit the amount of salt in your diet to less than 1,500 mg each day. ?Do not drink alcohol if: ?Your doctor tells you not to drink. ?You are pregnant, may be pregnant, or are planning to become pregnant. ?If you drink alcohol: ?Limit how much you have to: ?0-1 drink a day for women. ?0-2 drinks a day for men. ?Know how much alcohol is in your drink. In the [place] one drink equals one 12 oz bottle of beer (355 mL), one 5 oz glass of wine (148 mL), or one 1? oz glass of hard liquor (44 mL). ?Lifestyle ? ?Work with your doctor to stay at a healthy weight or to lose weight. Ask your doctor what the best weight is for you. ?Get at least 30 minutes of exercise that causes your heart to beat faster (aerobic exercise) most days of the week. This may include walking, swimming, or biking. ?Get at least 30 minutes of exercise that strengthens your muscles (resistance exercise) at least 3 days a week. This may include lifting weights or doing Pilates. ?Do not smoke or use any products that contain nicotine or tobacco. If you need help quitting, ask your doctor. ?Check your blood pressure at home as told by your doctor. ?Keep all follow-up visits. ?Medicines ?Take over-the-counter and prescription medicines   only as told by your doctor. Follow directions carefully. ?Do not skip doses of blood pressure medicine. The medicine does not work as well if you skip doses. Skipping doses also puts you at risk for problems. ?Ask your doctor about side effects or reactions to medicines that you should watch  for. ?Contact a doctor if: ?You think you are having a reaction to the medicine you are taking. ?You have headaches that keep coming back. ?You feel dizzy. ?You have swelling in your ankles. ?You have trouble with your vision. ?Get help right away if: ?You get a very bad headache. ?You start to feel mixed up (confused). ?You feel weak or numb. ?You feel faint. ?You have very bad pain in your: ?Chest. ?Belly (abdomen). ?You vomit more than once. ?You have trouble breathing. ?These symptoms may be an emergency. Get help right away. Call 911. ?Do not wait to see if the symptoms will go away. ?Do not drive yourself to the hospital. ?Summary ?Hypertension is another name for high blood pressure. ?High blood pressure forces your heart to work harder to pump blood. ?For most people, a normal blood pressure is less than 120/80. ?Making healthy choices can help lower blood pressure. If your blood pressure does not get lower with healthy choices, you may need to take medicine. ?This information is not intended to replace advice given to you by your health care provider. Make sure you discuss any questions you have with your health care provider. ?Document Revised: 11/02/2020 Document Reviewed: 11/02/2020 ?Elsevier Patient Education ? 2023 [employer] ? ?

## 2022-06-05 LAB — LIPID PANEL
Chol/HDL Ratio: 3.9 ratio (ref 0.0–4.4)
Cholesterol, Total: 179 mg/dL (ref 100–199)
HDL: 46 mg/dL (ref >39)
LDL Chol Calc (NIH): 109 mg/dL — ABNORMAL HIGH (ref 0–99)
Triglycerides: 133 mg/dL (ref 0–149)
VLDL Cholesterol Cal: 24 mg/dL (ref 5–40)

## 2022-06-05 LAB — CMP14+EGFR
ALT: 13 IU/L (ref 0–32)
AST: 17 IU/L (ref 0–40)
Albumin/Globulin Ratio: 1.2 (ref 1.2–2.2)
Albumin: 4.3 g/dL (ref 3.9–4.9)
Alkaline Phosphatase: 83 IU/L (ref 44–121)
BUN/Creatinine Ratio: 15 (ref 12–28)
BUN: 12 mg/dL (ref 8–27)
Bilirubin Total: 0.4 mg/dL (ref 0.0–1.2)
CO2: 26 mmol/L (ref 20–29)
Calcium: 9.8 mg/dL (ref 8.7–10.3)
Chloride: 96 mmol/L (ref 96–106)
Creatinine, Ser: 0.82 mg/dL (ref 0.57–1.00)
Globulin, Total: 3.6 g/dL (ref 1.5–4.5)
Glucose: 109 mg/dL — ABNORMAL HIGH (ref 70–99)
Potassium: 3.6 mmol/L (ref 3.5–5.2)
Sodium: 138 mmol/L (ref 134–144)
Total Protein: 7.9 g/dL (ref 6.0–8.5)
eGFR: 81 mL/min/{1.73_m2} (ref >59)

## 2022-06-05 LAB — MICROALBUMIN / CREATININE URINE RATIO
Creatinine, Urine: 34.5 mg/dL
Microalb/Creat Ratio: 94 mg/g creat — ABNORMAL HIGH (ref 0–29)
Microalbumin, Urine: 32.6 ug/mL

## 2022-06-05 LAB — TSH: TSH: 0.567 u[IU]/mL (ref 0.450–4.500)

## 2022-06-05 LAB — HEMOGLOBIN A1C
Est. average glucose Bld gHb Est-mCnc: 148 mg/dL
Hgb A1c MFr Bld: 6.8 % — ABNORMAL HIGH (ref 4.8–5.6)

## 2022-06-05 LAB — VITAMIN D 25 HYDROXY (VIT D DEFICIENCY, FRACTURES): Vit D, 25-Hydroxy: 37.7 ng/mL (ref 30.0–100.0)

## 2022-06-11 ENCOUNTER — Telehealth: Payer: Self-pay | Admitting: Neurology

## 2022-06-11 NOTE — Telephone Encounter (Signed)
Pt was scheduled for her initial CPAP on (08-21-22) Pt was informed to bring machine and power cord to the appointment.   DME -[facility] and between dates are 07/07/22-09/05/22

## 2022-07-29 ENCOUNTER — Other Ambulatory Visit: Payer: Self-pay | Admitting: Internal Medicine

## 2022-07-29 DIAGNOSIS — E1165 Type 2 diabetes mellitus with hyperglycemia: Secondary | ICD-10-CM

## 2022-08-06 DIAGNOSIS — H2513 Age-related nuclear cataract, bilateral: Secondary | ICD-10-CM | POA: Insufficient documentation

## 2022-08-21 ENCOUNTER — Encounter: Payer: Self-pay | Admitting: Neurology

## 2022-08-21 ENCOUNTER — Ambulatory Visit: Admitting: Neurology

## 2022-08-21 VITALS — BP 151/99 | HR 82 | Ht 60.0 in | Wt 252.0 lb

## 2022-08-21 DIAGNOSIS — G4734 Idiopathic sleep related nonobstructive alveolar hypoventilation: Secondary | ICD-10-CM

## 2022-08-21 DIAGNOSIS — G4733 Obstructive sleep apnea (adult) (pediatric): Secondary | ICD-10-CM | POA: Diagnosis not present

## 2022-08-21 DIAGNOSIS — R0601 Orthopnea: Secondary | ICD-10-CM | POA: Diagnosis not present

## 2022-08-21 NOTE — Patient Instructions (Signed)
Living With Sleep Apnea Sleep apnea is a condition in which breathing pauses or becomes shallow during sleep. Sleep apnea is most commonly caused by a collapsed or blocked airway. People with sleep apnea usually snore loudly. They may have times when they gasp and stop breathing for 10 seconds or more during sleep. This may happen many times during the night. The breaks in breathing also interrupt the deep sleep that you need to feel rested. Even if you do not completely wake up from the gaps in breathing, your sleep may not be restful and you feel tired during the day. You may also have a headache in the morning and low energy during the day, and you may feel anxious or depressed. How can sleep apnea affect me? Sleep apnea increases your chances of extreme tiredness during the day (daytime fatigue). It can also increase your risk for health conditions, such as: Heart attack. Stroke. Obesity. Type 2 diabetes. Heart failure. Irregular heartbeat. High blood pressure. If you have daytime fatigue as a result of sleep apnea, you may be more likely to: Perform poorly at school or work. Fall asleep while driving. Have difficulty with attention. Develop depression or anxiety. Have sexual dysfunction. What actions can I take to manage sleep apnea? Sleep apnea treatment  If you were given a device to open your airway while you sleep, use it only as told by your health care provider. You may be given: An oral appliance. This is a custom-made mouthpiece that shifts your lower jaw forward. A continuous positive airway pressure (CPAP) device. This device blows air through a mask when you breathe out (exhale). A nasal expiratory positive airway pressure (EPAP) device. This device has valves that you put into each nostril. A bi-level positive airway pressure (BIPAP) device. This device blows air through a mask when you breathe in (inhale) and breathe out (exhale). You may need surgery if other treatments  do not work for you. Sleep habits Go to sleep and wake up at the same time every day. This helps set your internal clock (circadian rhythm) for sleeping. If you stay up later than usual, such as on weekends, try to get up in the morning within 2 hours of your normal wake time. Try to get at least 7-9 hours of sleep each night. Stop using a computer, tablet, and mobile phone a few hours before bedtime. Do not take long naps during the day. If you nap, limit it to 30 minutes. Have a relaxing bedtime routine. Reading or listening to music may relax you and help you sleep. Use your bedroom only for sleep. Keep your television and computer out of your bedroom. Keep your bedroom cool, dark, and quiet. Use a supportive mattress and pillows. Follow your health care provider's instructions for other changes to sleep habits. Nutrition Do not eat heavy meals in the evening. Do not have caffeine in the later part of the day. The effects of caffeine can last for more than 5 hours. Follow your health care provider's or dietitian's instructions for any diet changes. Lifestyle     Do not drink alcohol before bedtime. Alcohol can cause you to fall asleep at first, but then it can cause you to wake up in the middle of the night and have trouble getting back to sleep. Do not use any products that contain nicotine or tobacco. These products include cigarettes, chewing tobacco, and vaping devices, such as e-cigarettes. If you need help quitting, ask your health care provider. Medicines Take   over-the-counter and prescription medicines only as told by your health care provider. Do not use over-the-counter sleep medicine. You can become dependent on this medicine, and it can make sleep apnea worse. Do not use medicines, such as sedatives and narcotics, unless told by your health care provider. Activity Exercise on most days, but avoid exercising in the evening. Exercising near bedtime can interfere with  sleeping. If possible, spend time outside every day. Natural light helps regulate your circadian rhythm. General information Lose weight if you need to, and maintain a healthy weight. Keep all follow-up visits. This is important. If you are having surgery, make sure to tell your health care provider that you have sleep apnea. You may need to bring your device with you. Where to find more information Learn more about sleep apnea and daytime fatigue from: American Sleep Association: sleepassociation.org National Sleep Foundation: sleepfoundation.org National Heart, Lung, and Blood Institute: nhlbi.nih.gov Summary Sleep apnea is a condition in which breathing pauses or becomes shallow during sleep. Sleep apnea can cause daytime fatigue and other serious health conditions. You may need to wear a device while sleeping to help keep your airway open. If you are having surgery, make sure to tell your health care provider that you have sleep apnea. You may need to bring your device with you. Making changes to sleep habits, diet, lifestyle, and activity can help you manage sleep apnea. This information is not intended to replace advice given to you by your health care provider. Make sure you discuss any questions you have with your health care provider. Document Revised: 08/23/2020 Document Reviewed: 12/24/2019 Elsevier Patient Education  2023 Else[employer] Inc[employer] CPAP and BIPAP Information CPAP and BIPAP are methods that use air pressure to keep your airways open and to help you breathe well. CPAP and BIPAP use different amounts of pressure. Your health care provider will tell you whether CPAP or BIPAP would be more helpful for you. CPAP stands for "continuous positive airway pressure." With CPAP, the amount of pressure stays the same while you breathe in (inhale) and out (exhale). BIPAP stands for "bi-level positive airway pressure." With BIPAP, the amount of pressure will be higher when you inhale and  lower when you exhale. This allows you to take larger breaths. CPAP or BIPAP may be used in the hospital, or your health care provider may want you to use it at home. You may need to have a sleep study before your health care provider can order a machine for you to use at home. What are the advantages? CPAP or BIPAP can be helpful if you have: Sleep apnea. Chronic obstructive pulmonary disease (COPD). Heart failure. Medical conditions that cause muscle weakness, including muscular dystrophy or amyotrophic lateral sclerosis (ALS). Other problems that cause breathing to be shallow, weak, abnormal, or difficult. CPAP and BIPAP are most commonly used for obstructive sleep apnea (OSA) to keep the airways from collapsing when the muscles relax during sleep. What are the risks? Generally, this is a safe treatment. However, problems may occur, including: Irritated skin or skin sores if the mask does not fit properly. Dry or stuffy nose or nosebleeds. Dry mouth. Feeling gassy or bloated. Sinus or lung infection if the equipment is not cleaned properly. When should CPAP or BIPAP be used? In most cases, the mask only needs to be worn during sleep. Generally, the mask needs to be worn throughout the night and during any daytime naps. People with certain medical conditions may also need to wear the mask   at other times, such as when they are awake. Follow instructions from your health care provider about when to use the machine. What happens during CPAP or BIPAP?  Both CPAP and BIPAP are provided by a small machine with a flexible plastic tube that attaches to a plastic mask that you wear. Air is blown through the mask into your nose or mouth. The amount of pressure that is used to blow the air can be adjusted on the machine. Your health care provider will set the pressure setting and help you find the best mask for you. Tips for using the mask Because the mask needs to be snug, some people feel trapped or  closed-in (claustrophobic) when first using the mask. If you feel this way, you may need to get used to the mask. One way to do this is to hold the mask loosely over your nose or mouth and then gradually apply the mask more snugly. You can also gradually increase the amount of time that you use the mask. Masks are available in various types and sizes. If your mask does not fit well, talk with your health care provider about getting a different one. Some common types of masks include: Full face masks, which fit over the mouth and nose. Nasal masks, which fit over the nose. Nasal pillow or prong masks, which fit into the nostrils. If you are using a mask that fits over your nose and you tend to breathe through your mouth, a chin strap may be applied to help keep your mouth closed. Use a skin barrier to protect your skin as told by your health care provider. Some CPAP and BIPAP machines have alarms that may sound if the mask comes off or develops a leak. If you have trouble with the mask, it is very important that you talk with your health care provider about finding a way to make the mask easier to tolerate. Do not stop using the mask. There could be a negative impact on your health if you stop using the mask. Tips for using the machine Place your CPAP or BIPAP machine on a secure table or stand near an electrical outlet. Know where the on/off switch is on the machine. Follow instructions from your health care provider about how to set the pressure on your machine and when you should use it. Do not eat or drink while the CPAP or BIPAP machine is on. Food or fluids could get pushed into your lungs by the pressure of the CPAP or BIPAP. For home use, CPAP and BIPAP machines can be rented or purchased through home health care companies. Many different brands of machines are available. Renting a machine before purchasing may help you find out which particular machine works well for you. Your health insurance  company may also decide which machine you may get. Keep the CPAP or BIPAP machine and attachments clean. Ask your health care provider for specific instructions. Check the humidifier if you have a dry stuffy nose or nosebleeds. Make sure it is working correctly. Follow these instructions at home: Take over-the-counter and prescription medicines only as told by your health care provider. Ask if you can take sinus medicine if your sinuses are blocked. Do not use any products that contain nicotine or tobacco. These products include cigarettes, chewing tobacco, and vaping devices, such as e-cigarettes. If you need help quitting, ask your health care provider. Keep all follow-up visits. This is important. Contact a health care provider if: You have redness   or pressure sores on your head, face, mouth, or nose from the mask or head gear. You have trouble using the CPAP or BIPAP machine. You cannot tolerate wearing the CPAP or BIPAP mask. Someone tells you that you snore even when wearing your CPAP or BIPAP. Get help right away if: You have trouble breathing. You feel confused. Summary CPAP and BIPAP are methods that use air pressure to keep your airways open and to help you breathe well. If you have trouble with the mask, it is very important that you talk with your health care provider about finding a way to make the mask easier to tolerate. Do not stop using the mask. There could be a negative impact to your health if you stop using the mask. Follow instructions from your health care provider about when to use the machine. This information is not intended to replace advice given to you by your health care provider. Make sure you discuss any questions you have with your health care provider. Document Revised: 08/23/2020 Document Reviewed: 12/24/2019 Elsevier Patient Education  2023 [employer] Inc.  

## 2022-08-21 NOTE — Progress Notes (Signed)
Provider:  Melvyn Novas, MD  Primary Care Physician:  Dorothyann Peng, MD [address] [place] [address]     Referring Provider: Dorothyann Peng, Md [address] [place],  [place] [address]          Chief Complaint according to patient   Patient presents with:     New on CPAP - Patient (Initial Visit)           HISTORY OF PRESENT ILLNESS:  Marisa [name] is a 62 y.o. female patient who is here for revisit 08/21/2022 for  first follow -up on CPAP .  Chief concern according to patient :  Pt is here for initial CPAP. No more  NOCTURIA, no snoring, rested in AM.  Pt states she is doing well with CPAP. States she is sleeping 6-7 hours, states she takes naps at times. The residual sleepiness is not apnea related , it is noted after medication intake.   90% compliance - 6.h 29 m. 6-18 cm water residual AHi is 0.4/h.      Here is the extract of the sleep study:  POLYSOMNOGRAPHY  INTERPRETATION REPORT    STUDY DATE:  11/09/2021       PATIENT NAME:  Marisa [name]         DATE OF BIRTH:  October 18, 1960  PATIENT ID:  [id number]    TYPE OF STUDY:  PSG  READING PHYSICIAN: Melvyn Novas, MD REFERRED BY: Dorothyann Peng, MD SCORING TECHNICIAN: Domingo Cocking, RPSGT   HISTORY:10-03-2021: Orthopnoea, pre-diabetes, Super obesity- high risk for OSA. Snoring, Nocturia.  Height: 60 in Weight: 263 lbs (BMI 51) Neck Size: 18 in   Based on AASM criteria, there were 54 apneas (53 obstructive; 0 central; 1 mixed), and 44 hypopneas. Apnea index was 9.9/h. Hypopnea index was 8.1/h. The AHI = apnea-hypopnea index was 18.0/h overall. Total time in sleep was 100% supine - with an AHI at 18.0/h supine. There was no REM sleep. There were 0 respiratory effort-related arousals (RERAs).  Continuous snoring was recorded.  Oxyhemoglobin Saturation Nadir during sleep was at 69% from a mean saturation of 92%.  Of the Total Sleep Time (TST), time in Hypoxemia  (<89%) was present for 39.7 minutes, or 12.2% of total sleep time.   Review of Systems: Out of a complete 14 system review, the patient complains of only the following symptoms, and all other reviewed systems are negative.:  Fatigue, sleepiness , snoring, fragmented sleep, - all improved    How likely are you to doze in the following situations: 0 = not likely, 1 = slight chance, 2 = moderate chance, 3 = high chance   Sitting and Reading? Watching Television? Sitting inactive in a public place (theater or meeting)? As a passenger in a car for an hour without a break? Lying down in the afternoon when circumstances permit? Sitting and talking to someone? Sitting quietly after lunch without alcohol? In a car, while stopped for a few minutes in traffic?   Total =  6 from 9/ 24 points   FSS endorsed at  14 , from 25/ 63 points.   Social History   Socioeconomic History   Marital status: Married    Spouse name: Not on file   Number of children: Not on file   Years of education: Not on file   Highest education level: Not on file  Occupational History   Not on file  Tobacco Use  Smoking status: Never   Smokeless tobacco: Never  Vaping Use   Vaping status: Never Used  Substance and Sexual Activity   Alcohol use: No   Drug use: No   Sexual activity: Not on file  Other Topics Concern   Not on file  Social History Narrative   Not on file   Social Determinants of Health   Financial Resource Strain: Not on file  Food Insecurity: Not on file  Transportation Needs: Not on file  Physical Activity: Not on file  Stress: Not on file  Social Connections: Unknown (06/08/2021)   Received from [employer], [facility]   Social Network    Social Network: Not on file    Family History  Problem Relation Age of Onset   Diabetes Mother    Hypertension Mother     Past Medical History:  Diagnosis Date   Borderline diabetes    Hypertension     Past Surgical History:   Procedure Laterality Date   ABDOMINAL HYSTERECTOMY     CESAREAN SECTION     SHOULDER SURGERY       Current Outpatient Medications on File Prior to Visit  Medication Sig Dispense Refill   Accu-Chek Softclix Lancets lancets Use as instructed to check blood sugars three time daily E11.65 100 each 2   atorvastatin (LIPITOR) 40 MG tablet Take one tablet by mouth everyday except sundays 90 tablet 1   Blood Glucose Monitoring Suppl (ACCU-CHEK GUIDE ME) w/Device KIT Use as directed to check blood sugars three times daily E11.65 1 kit 1   dapagliflozin propanediol (FARXIGA) 10 MG TABS tablet Take 1 tablet (10 mg total) by mouth daily before breakfast. (Patient taking differently: Take 10 mg by mouth daily before breakfast. Patient will take once daily.) 90 tablet 2   fluticasone (FLOVENT DISKUS) 50 MCG/BLIST diskus inhaler Inhale into the lungs.     folic acid (FOLVITE) 1 MG tablet One tablet po daily 90 tablet 1   glucose blood (ACCU-CHEK GUIDE) test strip Use as instructed to check blood sugars three times daily E11.65 100 each 2   linaclotide (LINZESS) 145 MCG CAPS capsule TK 1 C PO  BEFORE FIRST MEAL OF THE DAY. 90 capsule 1   metFORMIN (GLUCOPHAGE) 1000 MG tablet Take 1 tablet (1,000 mg total) by mouth 2 (two) times daily with a meal. (Patient taking differently: Take 1,000 mg by mouth once. Patient will take once daily.) 180 tablet 1   Olmesartan-amLODIPine-HCTZ 40-10-25 MG TABS Take 1 tablet by mouth daily. 90 tablet 1   Semaglutide, 1 MG/DOSE, (OZEMPIC, 1 MG/DOSE,) 4 MG/3ML SOPN INJECT 1 MG SUBCUTANEOUSLY ONCE A WEEK - (ADMINISTER BY SUBCUTANEOUS INJECTION INTO THE ABDOMEN, THIGH, OR UPPER ARM AT ANY TIME OF DAY ON THE SAME DAY EACH WEEK) DISCARD PEN 56 DAYS AFTER FIRST USE 9 mL 3   traMADol (ULTRAM) 50 MG tablet Take 1 tablet (50 mg total) by mouth every 6 (six) hours as needed. 20 tablet 0   Vitamin D, Ergocalciferol, (DRISDOL) 1.25 MG (50000 UNIT) CAPS capsule Take 1 capsule (50,000 Units  total) by mouth every 7 (seven) days. 12 capsule 0   spironolactone (ALDACTONE) 25 MG tablet Take 1 tablet (25 mg total) by mouth daily. (Patient not taking: Reported on 01/17/2022) 30 tablet 11   No current facility-administered medications on file prior to visit.     DIAGNOSTIC DATA (LABS, IMAGING, TESTING) - I reviewed patient records, labs, notes, testing and imaging myself where available.  Lab Results  Component  Value Date   WBC 6.6 03/07/2022   HGB 13.5 03/07/2022   HCT 42.8 03/07/2022   MCV 70 (L) 03/07/2022   PLT 301 03/07/2022      Component Value Date/Time   NA 138 06/04/2022 1518   K 3.6 06/04/2022 1518   CL 96 06/04/2022 1518   CO2 26 06/04/2022 1518   GLUCOSE 109 (H) 06/04/2022 1518   GLUCOSE 140 (H) 02/17/2015 0047   BUN 12 06/04/2022 1518   CREATININE 0.82 06/04/2022 1518   CALCIUM 9.8 06/04/2022 1518   PROT 7.9 06/04/2022 1518   ALBUMIN 4.3 06/04/2022 1518   AST 17 06/04/2022 1518   ALT 13 06/04/2022 1518   ALKPHOS 83 06/04/2022 1518   BILITOT 0.4 06/04/2022 1518   GFRNONAA 86 11/01/2019 1509   GFRAA 99 11/01/2019 1509   Lab Results  Component Value Date   CHOL 179 06/04/2022   HDL 46 06/04/2022   LDLCALC 109 (H) 06/04/2022   TRIG 133 06/04/2022   CHOLHDL 3.9 06/04/2022   Lab Results  Component Value Date   HGBA1C 6.8 (H) 06/04/2022   No results found for: "VITAMINB12" Lab Results  Component Value Date   TSH 0.567 06/04/2022    PHYSICAL EXAM:  Today's Vitals   08/21/22 1045  BP: (!) 151/99  Pulse: 82  Weight: 252 lb (114.3 kg)  Height: 5' (1.524 m)   Body mass index is 49.22 kg/m.   Wt Readings from Last 3 Encounters:  08/21/22 252 lb (114.3 kg)  06/04/22 247 lb 9.6 oz (112.3 kg)  06/03/22 247 lb 3.2 oz (112.1 kg)     Ht Readings from Last 3 Encounters:  08/21/22 5' (1.524 m)  06/04/22 5' (1.524 m)  03/07/22 5' (1.524 m)      General: The patient is awake, alert and appears not in acute distress. The patient is well  groomed. Head: Normocephalic, atraumatic. Neck is supple.  Mallampati 3 plus ,  neck circumference:18 inches . Nasal airflow  patent.  Retrognathia is not  seen.  Dental status: biological , TMJ.  Cardiovascular:  Regular rate and cardiac rhythm by pulse,  without distended neck veins. Respiratory:  deferred.  Skin:  With evidence of ankle edema, still swollen fingers. Trunk: The patient's posture is erect.   Neurologic exam : The patient is awake and alert, oriented to place and time.   Memory subjective described as intact.  Attention span & concentration ability appears normal.  Speech is fluent,.  Mood and affect are appropriate.   Cranial nerves: no loss of smell or taste reported  Pupils are equal and briskly reactive to light. Funduscopic exam deferred. .  Extraocular movements in vertical and horizontal planes were intact and without nystagmus. No Diplopia. Visual fields by finger perimetry are intact. Hearing was intact to soft voice and finger rubbing.    Facial sensation intact to fine touch.  Facial motor strength is symmetric and tongue and uvula move midline.  Neck ROM : rotation, tilt and flexion extension were normal for age and shoulder shrug was symmetrical.    Motor exam:  Symmetric bulk, tone and ROM.   Normal tone without cog -wheeling, symmetric grip strength .    ASSESSMENT AND PLAN:  62  year -old female patient with OSA and hypoxemia now new on CPAP here with:    1) Happy CPAP user, feeling less fatigued, no longer sleeping, no nocturia.   2) high compliance - with notable reduction of Epworth and fatigue scores.  3) encouraged further weight loss. RV in 12 months. BMI is now 49 .    Follow up either personally or through our NP within 12 months.   I would like to thank Dorothyann Peng, MD for allowing me to meet with and to take care of this pleasant patient.    After spending a total time of  30  minutes face to face and additional time for  physical and neurologic examination, review of laboratory studies,  personal review of imaging studies, reports and results of other testing and review of referral information / records as far as provided in visit,   Electronically signed by: Melvyn Novas, MD 08/21/2022 11:22 AM  [facility] Board certified by The [employer] of Sleep Medicine and Diplomate of the [employer] of [employer] Medicine. Board certified In Neurology through the ABPN, Fellow of the [employer] of Neu[employer].

## 2022-10-11 ENCOUNTER — Encounter: Payer: Self-pay | Admitting: Pharmacist

## 2022-10-14 ENCOUNTER — Other Ambulatory Visit: Payer: Self-pay | Admitting: Internal Medicine

## 2022-10-14 DIAGNOSIS — R87610 Atypical squamous cells of undetermined significance on cytologic smear of cervix (ASC-US): Secondary | ICD-10-CM

## 2022-10-21 ENCOUNTER — Ambulatory Visit (INDEPENDENT_AMBULATORY_CARE_PROVIDER_SITE_OTHER): Admitting: Internal Medicine

## 2022-10-21 ENCOUNTER — Encounter: Payer: Self-pay | Admitting: Internal Medicine

## 2022-10-21 ENCOUNTER — Other Ambulatory Visit (HOSPITAL_COMMUNITY)
Admission: RE | Admit: 2022-10-21 | Discharge: 2022-10-21 | Disposition: A | Discharge status: Home / Self Care | Source: Ambulatory Visit | Attending: Internal Medicine | Admitting: Internal Medicine

## 2022-10-21 VITALS — BP 132/80 | HR 87 | Temp 98.5°F | Ht 60.0 in | Wt 250.2 lb

## 2022-10-21 DIAGNOSIS — Z23 Encounter for immunization: Secondary | ICD-10-CM | POA: Diagnosis not present

## 2022-10-21 DIAGNOSIS — Z0000 Encounter for general adult medical examination without abnormal findings: Secondary | ICD-10-CM

## 2022-10-21 DIAGNOSIS — R87610 Atypical squamous cells of undetermined significance on cytologic smear of cervix (ASC-US): Secondary | ICD-10-CM | POA: Insufficient documentation

## 2022-10-21 DIAGNOSIS — E7800 Pure hypercholesterolemia, unspecified: Secondary | ICD-10-CM

## 2022-10-21 DIAGNOSIS — E1129 Type 2 diabetes mellitus with other diabetic kidney complication: Secondary | ICD-10-CM

## 2022-10-21 DIAGNOSIS — R809 Proteinuria, unspecified: Secondary | ICD-10-CM | POA: Diagnosis not present

## 2022-10-21 DIAGNOSIS — Z1211 Encounter for screening for malignant neoplasm of colon: Secondary | ICD-10-CM | POA: Diagnosis not present

## 2022-10-21 DIAGNOSIS — I10 Essential (primary) hypertension: Secondary | ICD-10-CM | POA: Diagnosis not present

## 2022-10-21 DIAGNOSIS — Z6841 Body Mass Index (BMI) 40.0 and over, adult: Secondary | ICD-10-CM

## 2022-10-21 DIAGNOSIS — Z79899 Other long term (current) drug therapy: Secondary | ICD-10-CM

## 2022-10-21 LAB — POCT URINALYSIS DIPSTICK
Bilirubin, UA: NEGATIVE
Blood, UA: NEGATIVE
Glucose, UA: POSITIVE — AB
Ketones, UA: NEGATIVE
Leukocytes, UA: NEGATIVE
Nitrite, UA: NEGATIVE
Protein, UA: NEGATIVE
Spec Grav, UA: <=1.005 — AB (ref 1.010–1.025)
Urobilinogen, UA: 0.2 E.U./dL
pH, UA: 5.5 (ref 5.0–8.0)

## 2022-10-21 LAB — POC HEMOCCULT BLD/STL (OFFICE/1-CARD/DIAGNOSTIC): Fecal Occult Blood, POC: NEGATIVE

## 2022-10-21 MED ORDER — METFORMIN HCL 1000 MG PO TABS
1000.0000 mg | ORAL_TABLET | Freq: Two times a day (BID) | ORAL | 2 refills | Status: AC
Start: 2022-10-21 — End: ?

## 2022-10-21 NOTE — Assessment & Plan Note (Signed)

## 2022-10-21 NOTE — Assessment & Plan Note (Addendum)
Chronic, fair control. Goal BP<120/80.  EKG performed, NSR w/ nonspecific T abnormality.  Importance of dietary/medication/exercise compliance was discussed with the patient.  She will continue with olmesartan/amlodipine/hydrochlorothiazide 40/10/25mg  daily.  She is encouraged to follow a low sodium diet. She will f/u in four months for re-evaluation.

## 2022-10-21 NOTE — Progress Notes (Signed)
ekgI,Jameka J Llittleton, CMA,acting as a Neurosurgeon for Gwynneth Aliment, MD.,have documented all relevant documentation on the behalf of Gwynneth Aliment, MD,as directed by  Gwynneth Aliment, MD while in the presence of Gwynneth Aliment, MD.  Subjective:    Patient ID: Marisa [name] , female    DOB: 1960-07-12 , 62 y.o.   MRN: [id number]  Chief Complaint  Patient presents with   Annual Exam   Diabetes   Hypertension    HPI  She is here today for a full physical examination. She is no longer followed by GYN. She has had a hysterectomy.   She reports compliance with meds. She denies headaches, chest pain and shortness of breath. She has no specific concerns or complaints at this time.   Letter sent to Dr. Logan Bores for eye exam.  Diabetes She presents for her follow-up diabetic visit. She has type 2 diabetes mellitus. Her disease course has been stable. There are no hypoglycemic associated symptoms. Pertinent negatives for hypoglycemia include no confusion, dizziness or headaches. Pertinent negatives for diabetes include no chest pain, no polydipsia, no polyphagia and no weakness. There are no hypoglycemic complications. Risk factors for coronary artery disease include diabetes mellitus, hypertension, sedentary lifestyle, post-menopausal, dyslipidemia and obesity. She does not see a podiatrist. Hypertension This is a chronic problem. The current episode started more than 1 year ago. The problem has been gradually improving since onset. The problem is uncontrolled. Pertinent negatives include no chest pain, headaches, palpitations or shortness of breath. Compliance problems include exercise.      Past Medical History:  Diagnosis Date   Borderline diabetes    Hypertension      Family History  Problem Relation Age of Onset   Diabetes Mother    Hypertension Mother      Current Outpatient Medications:    Accu-Chek Softclix Lancets lancets, Use as instructed to check blood sugars three  time daily E11.65, Disp: 100 each, Rfl: 2   atorvastatin (LIPITOR) 40 MG tablet, Take one tablet by mouth everyday except sundays, Disp: 90 tablet, Rfl: 1   Blood Glucose Monitoring Suppl (ACCU-CHEK GUIDE ME) w/Device KIT, Use as directed to check blood sugars three times daily E11.65, Disp: 1 kit, Rfl: 1   dapagliflozin propanediol (FARXIGA) 10 MG TABS tablet, Take 1 tablet (10 mg total) by mouth daily before breakfast. (Patient taking differently: Take 10 mg by mouth daily before breakfast. Patient will take once daily.), Disp: 90 tablet, Rfl: 2   folic acid (FOLVITE) 1 MG tablet, TAKE ONE TABLET BY MOUTH EVERY DAY, Disp: 90 tablet, Rfl: 1   glucose blood (ACCU-CHEK GUIDE) test strip, Use as instructed to check blood sugars three times daily E11.65, Disp: 100 each, Rfl: 2   linaclotide (LINZESS) 145 MCG CAPS capsule, TK 1 C PO  BEFORE FIRST MEAL OF THE DAY., Disp: 90 capsule, Rfl: 1   Olmesartan-amLODIPine-HCTZ 40-10-25 MG TABS, Take 1 tablet by mouth daily., Disp: 90 tablet, Rfl: 1   Semaglutide, 1 MG/DOSE, (OZEMPIC, 1 MG/DOSE,) 4 MG/3ML SOPN, INJECT 1 MG SUBCUTANEOUSLY ONCE A WEEK - (ADMINISTER BY SUBCUTANEOUS INJECTION INTO THE ABDOMEN, THIGH, OR UPPER ARM AT ANY TIME OF DAY ON THE SAME DAY EACH WEEK) DISCARD PEN 56 DAYS AFTER FIRST USE, Disp: 9 mL, Rfl: 3   metFORMIN (GLUCOPHAGE) 1000 MG tablet, Take 1 tablet (1,000 mg total) by mouth in the morning and at bedtime., Disp: 180 tablet, Rfl: 2   Vitamin D, Ergocalciferol, (DRISDOL) 1.25 MG (50000 UNIT)  CAPS capsule, TAKE ONE CAPSULE BY MOUTH EVERY SEVEN DAYS (MAY CONTAIN SOY OR PEANUT--TALK TO YOUR DOCTOR BEFORE TAKING IF YOU ARE ALLERGIC), Disp: 12 capsule, Rfl: 0   No Known Allergies    The patient states she uses status post hysterectomy for birth control. No LMP recorded. Patient has had a hysterectomy.. Negative for Dysmenorrhea. Negative for: breast discharge, breast lump(s), breast pain and breast self exam. Associated symptoms include  abnormal vaginal bleeding. Pertinent negatives include abnormal bleeding (hematology), anxiety, decreased libido, depression, difficulty falling sleep, dyspareunia, history of infertility, nocturia, sexual dysfunction, sleep disturbances, urinary incontinence, urinary urgency, vaginal discharge and vaginal itching. Diet regular.The patient states her exercise level is  intermittent.  . The patient's tobacco use is:  Social History   Tobacco Use  Smoking Status Never  Smokeless Tobacco Never  . She has been exposed to passive smoke. The patient's alcohol use is:  Social History   Substance and Sexual Activity  Alcohol Use No    Review of Systems  Constitutional: Negative.   HENT: Negative.    Eyes: Negative.   Respiratory: Negative.  Negative for shortness of breath.   Cardiovascular: Negative.  Negative for chest pain and palpitations.  Gastrointestinal: Negative.   Endocrine: Negative.  Negative for polydipsia and polyphagia.  Genitourinary: Negative.   Musculoskeletal: Negative.   Skin: Negative.   Neurological: Negative.  Negative for dizziness, weakness and headaches.  Hematological: Negative.   Psychiatric/Behavioral: Negative.  Negative for confusion.      Today's Vitals   10/21/22 1452  BP: 132/80  Pulse: 87  Temp: 98.5 F (36.9 C)  Weight: 250 lb 3.2 oz (113.5 kg)  Height: 5' (1.524 m)  PainSc: 0-No pain   Body mass index is 48.86 kg/m.  Wt Readings from Last 3 Encounters:  10/21/22 250 lb 3.2 oz (113.5 kg)  08/21/22 252 lb (114.3 kg)  06/04/22 247 lb 9.6 oz (112.3 kg)     Objective:  Physical Exam Vitals and nursing note reviewed. Exam conducted with a chaperone present.  Constitutional:      Appearance: Normal appearance. She is obese.  HENT:     Head: Normocephalic and atraumatic.     Right Ear: Tympanic membrane, ear canal and external ear normal.     Left Ear: Tympanic membrane, ear canal and external ear normal.     Nose: Nose normal.      Mouth/Throat:     Mouth: Mucous membranes are moist.     Pharynx: Oropharynx is clear.  Eyes:     Extraocular Movements: Extraocular movements intact.     Conjunctiva/sclera: Conjunctivae normal.     Pupils: Pupils are equal, round, and reactive to light.  Cardiovascular:     Rate and Rhythm: Normal rate and regular rhythm.     Pulses: Normal pulses.          Dorsalis pedis pulses are 2+ on the right side and 2+ on the left side.     Heart sounds: Normal heart sounds.  Pulmonary:     Effort: Pulmonary effort is normal.     Breath sounds: Normal breath sounds.  Chest:  Breasts:    Tanner Score is 5.     Right: Normal.     Left: Normal.  Abdominal:     General: Abdomen is flat. Bowel sounds are normal.     Palpations: Abdomen is soft.  Genitourinary:    General: Normal vulva.     Exam position: Lithotomy position.  Tanner stage (genital): 5.     Labia:        Right: No rash.        Left: No rash.      Vagina: Normal.     Cervix: Normal.     Uterus: Absent.      Rectum: Normal. Guaiac result negative.     Comments: deferred Musculoskeletal:        General: Normal range of motion.     Cervical back: Normal range of motion and neck supple.  Feet:     Right foot:     Protective Sensation: 5 sites tested.  5 sites sensed.     Skin integrity: Dry skin present.     Toenail Condition: Right toenails are normal.     Left foot:     Protective Sensation: 5 sites tested.  5 sites sensed.     Skin integrity: Dry skin present.     Toenail Condition: Left toenails are normal.  Lymphadenopathy:     Lower Body: No right inguinal adenopathy. No left inguinal adenopathy.  Skin:    General: Skin is warm and dry.  Neurological:     General: No focal deficit present.     Mental Status: She is alert and oriented to person, place, and time.  Psychiatric:        Mood and Affect: Mood normal.        Behavior: Behavior normal.         Assessment And Plan:     Encounter for  general adult medical examination w/o abnormal findings Assessment & Plan: A full exam was performed.  Importance of monthly self breast exams was discussed with the patient.  She is advised to get 30-45 minutes of regular exercise, no less than four to five days per week. Both weight-bearing and aerobic exercises are recommended.  She is advised to follow a healthy diet with at least six fruits/veggies per day, decrease intake of red meat and other saturated fats and to increase fish intake to twice weekly.  Meats/fish should not be fried -- baked, boiled or broiled is preferable. It is also important to cut back on your sugar intake.  Be sure to read labels - try to avoid anything with added sugar, high fructose corn syrup or other sweeteners.  If you must use a sweetener, you can try stevia or monkfruit.  It is also important to avoid artificially sweetened foods/beverages and diet drinks. Lastly, wear SPF 50 sunscreen on exposed skin and when in direct sunlight for an extended period of time.  Be sure to avoid fast food restaurants and aim for at least 60 ounces of water daily.      Orders: -     CBC -     Hemoglobin A1c -     Lipid panel -     CMP14+EGFR -     TSH  ASCUS of cervix with negative high risk HPV Assessment & Plan: Pap smear performed. DRE performed, stool is heme negative.   Orders: -     Cytology - PAP  Type 2 diabetes mellitus with microalbuminuria, without long-term current use of insulin (HCC) Assessment & Plan: Chronic, diabetic foot exam was performed. She will continue with Farxiga 10mg  daily, metformin 1000mg  twice daily and Ozempic 1mg  weekly.  I DISCUSSED WITH THE PATIENT AT LENGTH REGARDING THE GOALS OF GLYCEMIC CONTROL AND POSSIBLE LONG-TERM COMPLICATIONS.  I  ALSO STRESSED THE IMPORTANCE OF COMPLIANCE WITH HOME GLUCOSE MONITORING, DIETARY RESTRICTIONS  INCLUDING AVOIDANCE OF SUGARY DRINKS/PROCESSED FOODS,  ALONG WITH REGULAR EXERCISE.  I  ALSO STRESSED THE  IMPORTANCE OF ANNUAL EYE EXAMS, SELF FOOT CARE AND COMPLIANCE WITH OFFICE VISITS.   Orders: -     POCT urinalysis dipstick -     Flu vaccine trivalent PF, 6mos and older(Flulaval,Afluria,Fluarix,Fluzone) -     EKG 12-Lead -     metFORMIN HCl; Take 1 tablet (1,000 mg total) by mouth in the morning and at bedtime.  Dispense: 180 tablet; Refill: 2  Essential hypertension, benign Assessment & Plan: Chronic, fair control. Goal BP<120/80.  EKG performed, NSR w/ nonspecific T abnormality.  Importance of dietary/medication/exercise compliance was discussed with the patient.  She will continue with olmesartan/amlodipine/hydrochlorothiazide 40/10/25mg  daily.  She is encouraged to follow a low sodium diet. She will f/u in four months for re-evaluation.   Orders: -     EKG 12-Lead  Pure hypercholesterolemia Assessment & Plan: Chronic, LDL goal is less than 120/80.  She will continue with atorvastatin 40mg  daily except Sundays. She is encouraged to follow a heart healthy lifestyle -clean foods, baked meats, regular exercise and adequate sleep.   Class 3 severe obesity due to excess calories with serious comorbidity and body mass index (BMI) of 45.0 to 49.9 in adult [facility]) Assessment & Plan: BMI 48.  She is encouraged to initially strive for BMI less than 40 to decrease cardiac risk. Advised to aim for at least 150 minutes of exercise per week.    Drug therapy  Immunization due -     Flu vaccine trivalent PF, 6mos and older(Flulaval,Afluria,Fluarix,Fluzone)  Encounter for Hemoccult screening -     POC Hemoccult Bld/Stl (1-Cd Office Dx)  Other orders -     T4, free -     Specimen status report     Return for 1 year physical, 3 month dm check. Patient was given opportunity to ask questions. Patient verbalized understanding of the plan and was able to repeat key elements of the plan. All questions were answered to their satisfaction.   I, Gwynneth Aliment, MD, have reviewed all  documentation for this visit. The documentation on 10/21/22 for the exam, diagnosis, procedures, and orders are all accurate and complete.

## 2022-10-21 NOTE — Assessment & Plan Note (Signed)
BMI 48.  She is encouraged to initially strive for BMI less than 40 to decrease cardiac risk. Advised to aim for at least 150 minutes of exercise per week.

## 2022-10-22 ENCOUNTER — Other Ambulatory Visit: Payer: Self-pay | Admitting: Internal Medicine

## 2022-10-22 DIAGNOSIS — E559 Vitamin D deficiency, unspecified: Secondary | ICD-10-CM

## 2022-10-22 LAB — CMP14+EGFR
ALT: 14 IU/L (ref 0–32)
AST: 13 IU/L (ref 0–40)
Albumin: 4.1 g/dL (ref 3.9–4.9)
Alkaline Phosphatase: 78 IU/L (ref 44–121)
BUN/Creatinine Ratio: 12 (ref 12–28)
BUN: 10 mg/dL (ref 8–27)
Bilirubin Total: 0.3 mg/dL (ref 0.0–1.2)
CO2: 25 mmol/L (ref 20–29)
Calcium: 9.6 mg/dL (ref 8.7–10.3)
Chloride: 102 mmol/L (ref 96–106)
Creatinine, Ser: 0.81 mg/dL (ref 0.57–1.00)
Globulin, Total: 3.2 g/dL (ref 1.5–4.5)
Glucose: 89 mg/dL (ref 70–99)
Potassium: 3.6 mmol/L (ref 3.5–5.2)
Sodium: 142 mmol/L (ref 134–144)
Total Protein: 7.3 g/dL (ref 6.0–8.5)
eGFR: 82 mL/min/{1.73_m2} (ref >59)

## 2022-10-22 LAB — CBC
Hematocrit: 42 % (ref 34.0–46.6)
Hemoglobin: 12.9 g/dL (ref 11.1–15.9)
MCH: 22.6 pg — ABNORMAL LOW (ref 26.6–33.0)
MCHC: 30.7 g/dL — ABNORMAL LOW (ref 31.5–35.7)
MCV: 74 fL — ABNORMAL LOW (ref 79–97)
Platelets: 315 10*3/uL (ref 150–450)
RBC: 5.7 x10E6/uL — ABNORMAL HIGH (ref 3.77–5.28)
RDW: 17.4 % — ABNORMAL HIGH (ref 11.7–15.4)
WBC: 6.5 10*3/uL (ref 3.4–10.8)

## 2022-10-22 LAB — TSH: TSH: 0.413 u[IU]/mL — ABNORMAL LOW (ref 0.450–4.500)

## 2022-10-22 LAB — LIPID PANEL
Chol/HDL Ratio: 3.2 ratio (ref 0.0–4.4)
Cholesterol, Total: 157 mg/dL (ref 100–199)
HDL: 49 mg/dL (ref >39)
LDL Chol Calc (NIH): 87 mg/dL (ref 0–99)
Triglycerides: 118 mg/dL (ref 0–149)
VLDL Cholesterol Cal: 21 mg/dL (ref 5–40)

## 2022-10-22 LAB — HEMOGLOBIN A1C
Est. average glucose Bld gHb Est-mCnc: 128 mg/dL
Hgb A1c MFr Bld: 6.1 % — ABNORMAL HIGH (ref 4.8–5.6)

## 2022-10-23 ENCOUNTER — Other Ambulatory Visit: Payer: Self-pay | Admitting: Internal Medicine

## 2022-10-23 DIAGNOSIS — E1129 Type 2 diabetes mellitus with other diabetic kidney complication: Secondary | ICD-10-CM

## 2022-10-24 LAB — T4, FREE: Free T4: 1.49 ng/dL (ref 0.82–1.77)

## 2022-10-24 LAB — SPECIMEN STATUS REPORT

## 2022-10-27 MED ORDER — METFORMIN HCL 1000 MG PO TABS
1000.0000 mg | ORAL_TABLET | Freq: Two times a day (BID) | ORAL | 2 refills | Status: DC
Start: 2022-10-27 — End: 2023-11-26

## 2022-10-27 NOTE — Assessment & Plan Note (Signed)
Pap smear performed. DRE performed, stool is heme negative.

## 2022-10-27 NOTE — Assessment & Plan Note (Signed)
Chronic, diabetic foot exam was performed. She will continue with Farxiga 10mg  daily, metformin 1000mg  twice daily and Ozempic 1mg  weekly.  I DISCUSSED WITH THE PATIENT AT LENGTH REGARDING THE GOALS OF GLYCEMIC CONTROL AND POSSIBLE LONG-TERM COMPLICATIONS.  I  ALSO STRESSED THE IMPORTANCE OF COMPLIANCE WITH HOME GLUCOSE MONITORING, DIETARY RESTRICTIONS INCLUDING AVOIDANCE OF SUGARY DRINKS/PROCESSED FOODS,  ALONG WITH REGULAR EXERCISE.  I  ALSO STRESSED THE IMPORTANCE OF ANNUAL EYE EXAMS, SELF FOOT CARE AND COMPLIANCE WITH OFFICE VISITS.

## 2022-10-27 NOTE — Assessment & Plan Note (Signed)
Chronic, LDL goal is less than 120/80.  She will continue with atorvastatin 40mg  daily except Sundays. She is encouraged to follow a heart healthy lifestyle -clean foods, baked meats, regular exercise and adequate sleep.

## 2022-10-28 LAB — CYTOLOGY - PAP
Comment: NEGATIVE
Diagnosis: NEGATIVE
High risk HPV: NEGATIVE

## 2022-12-09 DIAGNOSIS — H25013 Cortical age-related cataract, bilateral: Secondary | ICD-10-CM | POA: Insufficient documentation

## 2022-12-09 DIAGNOSIS — H52203 Unspecified astigmatism, bilateral: Secondary | ICD-10-CM | POA: Insufficient documentation

## 2022-12-19 LAB — HM DIABETES EYE EXAM

## 2023-02-05 ENCOUNTER — Ambulatory Visit: Admitting: Internal Medicine

## 2023-02-19 ENCOUNTER — Ambulatory Visit (INDEPENDENT_AMBULATORY_CARE_PROVIDER_SITE_OTHER): Admitting: Internal Medicine

## 2023-02-19 ENCOUNTER — Encounter: Payer: Self-pay | Admitting: Internal Medicine

## 2023-02-19 VITALS — BP 134/86 | HR 82 | Temp 98.3°F | Ht 60.0 in | Wt 251.6 lb

## 2023-02-19 DIAGNOSIS — E785 Hyperlipidemia, unspecified: Secondary | ICD-10-CM

## 2023-02-19 DIAGNOSIS — E1169 Type 2 diabetes mellitus with other specified complication: Secondary | ICD-10-CM

## 2023-02-19 DIAGNOSIS — Z23 Encounter for immunization: Secondary | ICD-10-CM | POA: Diagnosis not present

## 2023-02-19 DIAGNOSIS — I10 Essential (primary) hypertension: Secondary | ICD-10-CM | POA: Diagnosis not present

## 2023-02-19 DIAGNOSIS — E66813 Obesity, class 3: Secondary | ICD-10-CM | POA: Diagnosis not present

## 2023-02-19 DIAGNOSIS — Z6841 Body Mass Index (BMI) 40.0 and over, adult: Secondary | ICD-10-CM

## 2023-02-19 MED ORDER — ATORVASTATIN CALCIUM 40 MG PO TABS: ORAL_TABLET | ORAL | 1 refills | Status: DC

## 2023-02-19 NOTE — Assessment & Plan Note (Signed)
Chronic, fair control. Goal BP<120/80.  Importance of dietary/medication/exercise compliance was discussed with the patient.  She will continue with olmesartan/amlodipine/hydrochlorothiazide 40/10/25mg  daily.  She is encouraged to follow a low sodium diet. She will f/u in four months for re-evaluation.

## 2023-02-19 NOTE — Assessment & Plan Note (Signed)
BMI 49.  She is encouraged to initially strive for BMI less than 40 to decrease cardiac risk. Advised to aim for at least 150 minutes of exercise per week.

## 2023-02-19 NOTE — Progress Notes (Signed)
I,Victoria T Deloria Lair, CMA,acting as a Neurosurgeon for Gwynneth Aliment, MD.,have documented all relevant documentation on the behalf of Gwynneth Aliment, MD,as directed by  Gwynneth Aliment, MD while in the presence of Gwynneth Aliment, MD.  Subjective:  Patient ID: Marisa [name] , female    DOB: [birth year] , 63 y.o.   MRN: [id number]  Chief Complaint  Patient presents with   Diabetes   Hypertension   Hyperlipidemia    HPI  She is here today for diabetes, hypertension and  cholesterol  f/u.  She reports compliance with meds.  She denies having any headaches, chest pain and shortness of breath. She denies having any specific questions or concerns.   She states her sugars have been elevated in the 200s. She admits she does eat things that will raise her sugars.  She admits she is also not exercising on a regular basis.       Diabetes She presents for her follow-up diabetic visit. She has type 2 diabetes mellitus. Her disease course has been stable. There are no hypoglycemic associated symptoms. Pertinent negatives for diabetes include no blurred vision. There are no hypoglycemic complications. Risk factors for coronary artery disease include diabetes mellitus, hypertension, sedentary lifestyle, post-menopausal, dyslipidemia and obesity. She is following a diabetic diet. She does not see a podiatrist. Hypertension This is a chronic problem. The current episode started more than 1 year ago. The problem has been gradually improving since onset. The problem is uncontrolled. Pertinent negatives include no blurred vision. Risk factors for coronary artery disease include diabetes mellitus, dyslipidemia, obesity, post-menopausal state and sedentary lifestyle. The current treatment provides moderate improvement. Compliance problems include exercise.      Past Medical History:  Diagnosis Date   Borderline diabetes    Hypertension      Family History  Problem Relation Age of Onset   Diabetes  Mother    Hypertension Mother      Current Outpatient Medications:    Blood Glucose Monitoring Suppl (ACCU-CHEK GUIDE ME) w/Device KIT, Use as directed to check blood sugars three times daily E11.65, Disp: 1 kit, Rfl: 1   dapagliflozin propanediol (FARXIGA) 10 MG TABS tablet, Take 1 tablet (10 mg total) by mouth daily before breakfast. (Patient taking differently: Take 10 mg by mouth daily before breakfast. Patient will take once daily.), Disp: 90 tablet, Rfl: 2   folic acid (FOLVITE) 1 MG tablet, TAKE ONE TABLET BY MOUTH EVERY DAY, Disp: 90 tablet, Rfl: 1   glucose blood (ACCU-CHEK GUIDE) test strip, Use as instructed to check blood sugars three times daily E11.65, Disp: 100 each, Rfl: 2   linaclotide (LINZESS) 145 MCG CAPS capsule, TK 1 C PO  BEFORE FIRST MEAL OF THE DAY., Disp: 90 capsule, Rfl: 1   metFORMIN (GLUCOPHAGE) 1000 MG tablet, Take 1 tablet (1,000 mg total) by mouth in the morning and at bedtime., Disp: 180 tablet, Rfl: 2   Olmesartan-amLODIPine-HCTZ 40-10-25 MG TABS, Take 1 tablet by mouth daily., Disp: 90 tablet, Rfl: 1   Semaglutide, 1 MG/DOSE, (OZEMPIC, 1 MG/DOSE,) 4 MG/3ML SOPN, INJECT 1 MG SUBCUTANEOUSLY ONCE A WEEK - (ADMINISTER BY SUBCUTANEOUS INJECTION INTO THE ABDOMEN, THIGH, OR UPPER ARM AT ANY TIME OF DAY ON THE SAME DAY EACH WEEK) DISCARD PEN 56 DAYS AFTER FIRST USE, Disp: 9 mL, Rfl: 3   Vitamin D, Ergocalciferol, (DRISDOL) 1.25 MG (50000 UNIT) CAPS capsule, TAKE ONE CAPSULE BY MOUTH EVERY SEVEN DAYS (MAY CONTAIN SOY OR PEANUT--TALK TO YOUR DOCTOR BEFORE  TAKING IF YOU ARE ALLERGIC), Disp: 12 capsule, Rfl: 0   Accu-Chek Softclix Lancets lancets, Use as instructed to check blood sugars three time daily E11.65, Disp: 100 each, Rfl: 2   atorvastatin (LIPITOR) 40 MG tablet, Take one tablet by mouth everyday except sundays, Disp: 90 tablet, Rfl: 1   No Known Allergies   Review of Systems  Constitutional: Negative.   Eyes:  Negative for blurred vision.  Respiratory:  Negative.    Cardiovascular: Negative.   Gastrointestinal: Negative.   Neurological: Negative.   Psychiatric/Behavioral: Negative.       Today's Vitals   02/19/23 1141  BP: 134/86  Pulse: 82  Temp: 98.3 F (36.8 C)  SpO2: 98%  Weight: 251 lb 9.6 oz (114.1 kg)  Height: 5' (1.524 m)   Body mass index is 49.14 kg/m.  Wt Readings from Last 3 Encounters:  02/19/23 251 lb 9.6 oz (114.1 kg)  10/21/22 250 lb 3.2 oz (113.5 kg)  08/21/22 252 lb (114.3 kg)     Objective:  Physical Exam Vitals and nursing note reviewed.  Constitutional:      Appearance: Normal appearance. She is obese.  HENT:     Head: Normocephalic and atraumatic.  Eyes:     Extraocular Movements: Extraocular movements intact.  Cardiovascular:     Rate and Rhythm: Normal rate and regular rhythm.     Heart sounds: Normal heart sounds.  Pulmonary:     Effort: Pulmonary effort is normal.     Breath sounds: Normal breath sounds.  Musculoskeletal:     Cervical back: Normal range of motion.  Skin:    General: Skin is warm.  Neurological:     General: No focal deficit present.     Mental Status: She is alert.  Psychiatric:        Mood and Affect: Mood normal.        Behavior: Behavior normal.         Assessment And Plan:  Dyslipidemia associated with type 2 diabetes mellitus (HCC) Assessment & Plan: Chronic, she states her sugars are uncontrolled. She admits to indiscretion with her diet.  She will continue with [place] 10mg  daily, metformin 1000mg  twice daily and Ozempic 1mg  weekly for now. I do plan to increase the Ozempic to 1.5mg  then 2mg  weekly.  She was given sample of 0.5mg  Ozempic to use with Ozempic 1mg  weekly. After reviewing labs, I will make additional recommendations as needed.    Orders: -     CMP14+EGFR -     Hemoglobin A1c -     TSH + free T4  Essential hypertension, benign Assessment & Plan: Chronic, fair control. Goal BP<120/80.  Importance of dietary/medication/exercise compliance  was discussed with the patient.  She will continue with olmesartan/amlodipine/hydrochlorothiazide 40/10/25mg  daily.  She is encouraged to follow a low sodium diet. She will f/u in four months for re-evaluation.   Orders: -     CMP14+EGFR  Class 3 severe obesity due to excess calories with serious comorbidity and body mass index (BMI) of 45.0 to 49.9 in adult [facility]) Assessment & Plan: BMI 49.  She is encouraged to initially strive for BMI less than 40 to decrease cardiac risk. Advised to aim for at least 150 minutes of exercise per week.    Immunization due -     Pneumococcal conjugate vaccine 20-valent  Other orders -     Atorvastatin Calcium; Take one tablet by mouth everyday except sundays  Dispense: 90 tablet; Refill: 1  She is encouraged  to strive for BMI less than 30 to decrease cardiac risk. Advised to aim for at least 150 minutes of exercise per week.    Return for 3 month dm check.  Patient was given opportunity to ask questions. Patient verbalized understanding of the plan and was able to repeat key elements of the plan. All questions were answered to their satisfaction.    I, Gwynneth Aliment, MD, have reviewed all documentation for this visit. The documentation on 02/19/23 for the exam, diagnosis, procedures, and orders are all accurate and complete.   IF YOU HAVE BEEN REFERRED TO A SPECIALIST, IT MAY TAKE 1-2 WEEKS TO SCHEDULE/PROCESS THE REFERRAL. IF YOU HAVE NOT HEARD FROM US/SPECIALIST IN TWO WEEKS, PLEASE GIVE [place] A CALL AT [phone].   THE PATIENT IS ENCOURAGED TO PRACTICE SOCIAL DISTANCING DUE TO THE COVID-19 PANDEMIC.

## 2023-02-19 NOTE — Patient Instructions (Signed)

## 2023-02-19 NOTE — Assessment & Plan Note (Addendum)
Chronic, she states her sugars are uncontrolled. She admits to indiscretion with her diet.  She will continue with [place] 10mg  daily, metformin 1000mg  twice daily and Ozempic 1mg  weekly for now. I do plan to increase the Ozempic to 1.5mg  then 2mg  weekly.  She was given sample of 0.5mg  Ozempic to use with Ozempic 1mg  weekly. After reviewing labs, I will make additional recommendations as needed.

## 2023-02-20 LAB — CMP14+EGFR
ALT: 13 [IU]/L (ref 0–32)
AST: 15 [IU]/L (ref 0–40)
Albumin: 3.9 g/dL (ref 3.9–4.9)
Alkaline Phosphatase: 89 [IU]/L (ref 44–121)
BUN/Creatinine Ratio: 12 (ref 12–28)
BUN: 9 mg/dL (ref 8–27)
Bilirubin Total: 0.2 mg/dL (ref 0.0–1.2)
CO2: 25 mmol/L (ref 20–29)
Calcium: 9.1 mg/dL (ref 8.7–10.3)
Chloride: 104 mmol/L (ref 96–106)
Creatinine, Ser: 0.73 mg/dL (ref 0.57–1.00)
Globulin, Total: 3.2 g/dL (ref 1.5–4.5)
Glucose: 120 mg/dL — ABNORMAL HIGH (ref 70–99)
Potassium: 4.2 mmol/L (ref 3.5–5.2)
Sodium: 142 mmol/L (ref 134–144)
Total Protein: 7.1 g/dL (ref 6.0–8.5)
eGFR: 92 mL/min/{1.73_m2} (ref >59)

## 2023-02-20 LAB — TSH+FREE T4
Free T4: 1.28 ng/dL (ref 0.82–1.77)
TSH: 0.734 u[IU]/mL (ref 0.450–4.500)

## 2023-02-20 LAB — HEMOGLOBIN A1C
Est. average glucose Bld gHb Est-mCnc: 140 mg/dL
Hgb A1c MFr Bld: 6.5 % — ABNORMAL HIGH (ref 4.8–5.6)

## 2023-03-26 ENCOUNTER — Other Ambulatory Visit: Payer: Self-pay | Admitting: Internal Medicine

## 2023-03-26 DIAGNOSIS — E559 Vitamin D deficiency, unspecified: Secondary | ICD-10-CM

## 2023-03-26 DIAGNOSIS — E1165 Type 2 diabetes mellitus with hyperglycemia: Secondary | ICD-10-CM

## 2023-03-26 DIAGNOSIS — K5909 Other constipation: Secondary | ICD-10-CM

## 2023-03-26 DIAGNOSIS — R87610 Atypical squamous cells of undetermined significance on cytologic smear of cervix (ASC-US): Secondary | ICD-10-CM

## 2023-05-02 LAB — HM MAMMOGRAPHY

## 2023-05-06 ENCOUNTER — Encounter: Payer: Self-pay | Admitting: Internal Medicine

## 2023-05-20 ENCOUNTER — Ambulatory Visit: Admitting: Internal Medicine

## 2023-05-27 ENCOUNTER — Other Ambulatory Visit: Payer: Self-pay | Admitting: Internal Medicine

## 2023-05-27 ENCOUNTER — Encounter: Payer: Self-pay | Admitting: Internal Medicine

## 2023-05-27 ENCOUNTER — Ambulatory Visit (INDEPENDENT_AMBULATORY_CARE_PROVIDER_SITE_OTHER): Admitting: Internal Medicine

## 2023-05-27 VITALS — BP 150/98 | HR 97 | Temp 98.0°F | Ht 60.0 in | Wt 246.4 lb

## 2023-05-27 DIAGNOSIS — E7800 Pure hypercholesterolemia, unspecified: Secondary | ICD-10-CM | POA: Diagnosis not present

## 2023-05-27 DIAGNOSIS — E785 Hyperlipidemia, unspecified: Secondary | ICD-10-CM

## 2023-05-27 DIAGNOSIS — E1169 Type 2 diabetes mellitus with other specified complication: Secondary | ICD-10-CM

## 2023-05-27 DIAGNOSIS — E66813 Obesity, class 3: Secondary | ICD-10-CM

## 2023-05-27 DIAGNOSIS — G4733 Obstructive sleep apnea (adult) (pediatric): Secondary | ICD-10-CM

## 2023-05-27 DIAGNOSIS — L989 Disorder of the skin and subcutaneous tissue, unspecified: Secondary | ICD-10-CM | POA: Insufficient documentation

## 2023-05-27 DIAGNOSIS — R000 Tachycardia, unspecified: Secondary | ICD-10-CM | POA: Insufficient documentation

## 2023-05-27 DIAGNOSIS — I10 Essential (primary) hypertension: Secondary | ICD-10-CM | POA: Diagnosis not present

## 2023-05-27 DIAGNOSIS — E559 Vitamin D deficiency, unspecified: Secondary | ICD-10-CM

## 2023-05-27 DIAGNOSIS — Z6841 Body Mass Index (BMI) 40.0 and over, adult: Secondary | ICD-10-CM

## 2023-05-27 MED ORDER — NEBIVOLOL HCL 5 MG PO TABS
5.0000 mg | ORAL_TABLET | Freq: Every day | ORAL | 1 refills | Status: DC
Start: 2023-05-27 — End: 2023-06-20

## 2023-05-27 MED ORDER — OLMESARTAN-AMLODIPINE-HCTZ 40-10-25 MG PO TABS: 1.0000 | ORAL_TABLET | Freq: Every day | ORAL | 1 refills | Status: DC

## 2023-05-27 NOTE — Assessment & Plan Note (Signed)
 Hypertension remains uncontrolled with elevated home and office readings. Current regimen includes olmesartan , amlodipine , and hydrochlorothiazide . Goal is BP <120/80. Discussed 24-hour control to reduce cardiovascular risks. - Prescribe additional antihypertensive for evening dose. - Start Bystolic 5mg  nightly, this will be taken with dinner - Recheck BP in two weeks with nurse visit. - Reminded to follow low sodium diet.

## 2023-05-27 NOTE — Assessment & Plan Note (Signed)
 She is advised to stay well hydrated and to avoid caffeinated beverages. She is encouraged to gradually increase her daily activity to help with conditioning. At this time, she denies having any palpitations.

## 2023-05-27 NOTE — Assessment & Plan Note (Signed)
 Suboptimal glycemic control with home glucose 139-160 mg/dL. Current regimen includes Ozempic , metformin , and Farxiga . Plan to increase Ozempic  for better control and weight loss. Discussed Ozempic 's benefits in reducing cardiac and renal risks. - Increase Ozempic  to 1.5 mg weekly for one month, then 2 mg weekly. - Order A1c test. - She will f/u In 3 months for re-evaluation.

## 2023-05-27 NOTE — Patient Instructions (Addendum)
 Increase Ozempic  to 1mg  PLUS 0.5mg  starting Monday, May 5th Continue this dose for four weeks, then start Ozempic  1mg  PLUS 1mg   Type 2 Diabetes Mellitus, Diagnosis, Adult Type 2 diabetes (type 2 diabetes mellitus) is a long-term (chronic) disease. It may happen when there is one or both of these problems: The pancreas does not make enough insulin. The body does not react in a normal way to insulin that it makes. Insulin lets sugars go into cells in your body. If you have type 2 diabetes, sugars cannot get into your cells. Sugars build up in the blood. This causes high blood sugar. What are the causes? The exact cause of this condition is not known. What increases the risk? Having type 2 diabetes in your family. Being overweight or very overweight. Not being active. Your body not reacting in a normal way to the insulin it makes. Having higher than normal blood sugar over time. Having a type of diabetes when you were pregnant. Having a condition that causes small fluid-filled sacs on your ovaries. What are the signs or symptoms? At first, you may have no symptoms. You will get symptoms slowly. They may include: More thirst than normal. More hunger than normal. Needing to pee more than normal. Losing weight without trying. Feeling tired. Feeling weak. Seeing things blurry. Dark patches on your skin. How is this treated? This condition may be treated by a diabetes expert. You may need to: Follow an eating plan made by a food expert (dietitian). Get regular exercise. Find ways to deal with stress. Check blood sugar as often as told. Take medicines. Your doctor will set treatment goals for you. Your blood sugar should be at these levels: Before meals: 80-130 mg/dL (4.4-7.2 mmol/L). After meals: below 180 mg/dL (10 mmol/L). Over the last 2-3 months: less than 7%. Follow these instructions at home: Medicines Take your diabetes medicines or insulin every day. Take medicines as told  to help you prevent other problems caused by this condition. You may need: Aspirin. Medicine to lower cholesterol. Medicine to control blood pressure. Questions to ask your doctor Should I meet with a diabetes educator? What medicines do I need, and when should I take them? What will I need to treat my condition at home? When should I check my blood sugar? Where can I find a support group? Who can I call if I have questions? When is my next doctor visit? General instructions Take over-the-counter and prescription medicines only as told by your doctor. Keep all follow-up visits. Where to find more information For help and guidance and more information about diabetes, please go to: American Diabetes Association (ADA): www.diabetes.org American Association of Diabetes Care and Education Specialists (ADCES): www.diabeteseducator.org International Diabetes Federation (IDF): DCOnly.dk Contact a doctor if: Your blood sugar is at or above 240 mg/dL (16.1 mmol/L) for 2 days in a row. You have been sick for 2 days or more, and you are not getting better. You have had a fever for 2 days or more, and you are not getting better. You have any of these problems for more than 6 hours: You cannot eat or drink. You feel like you may vomit. You vomit. You have watery poop (diarrhea). Get help right away if: Your blood sugar is lower than 54 mg/dL (3 mmol/L). You feel mixed up (confused). You have trouble thinking clearly. You have trouble breathing. You have medium or large ketone levels in your pee. These symptoms may be an emergency. Get help right away. Call  your local emergency services (911 in the [place] Do not wait to see if the symptoms will go away. Do not drive yourself to the hospital. Summary Type 2 diabetes is a long-term disease. Your pancreas may not make enough insulin, or your body may not react in a normal way to insulin that it makes. This condition is treated with an eating  plan, lifestyle changes, and medicines. Your doctor will set treatment goals for you. These will help you keep your blood sugar in a healthy range. Keep all follow-up visits. This information is not intended to replace advice given to you by your health care provider. Make sure you discuss any questions you have with your health care provider. Document Revised: 04/10/2020 Document Reviewed: 04/10/2020 Elsevier Patient Education  2024 [employer].

## 2023-05-27 NOTE — Assessment & Plan Note (Signed)
 BMI 48.  She was congratulated on her 5lb weight loss since her last visit.  She is encouraged to initially strive for BMI less than 40 to decrease cardiac risk. Advised to aim for at least 150 minutes of exercise per week.

## 2023-05-27 NOTE — Assessment & Plan Note (Signed)
 Chronic, LDL goal is less than 70.  Discussed cardiac risk factors and screening. She is willing to undergo cardiac calcium  score. - Order lipoprotein A test. - Offer cardiac calcium  score for $99.

## 2023-05-27 NOTE — Progress Notes (Signed)
 I,Victoria T Basil Lim, CMA,acting as a Neurosurgeon for Smiley Dung, MD.,have documented all relevant documentation on the behalf of Smiley Dung, MD,as directed by  Smiley Dung, MD while in the presence of Smiley Dung, MD.  Subjective:  Patient ID: Marisa [name] , female    DOB: 07-13-1960 , 63 y.o.   MRN: [id number]  Chief Complaint  Patient presents with   Diabetes    Patient presents today for dm, bp & cholesterol follow up. She reports compliance with medications. Denies headache, chest pain & sob. While here today she would like recommendation for a dermatologist. She has skin tag like moles on her hands she is concerned about.    Hypertension   Hyperlipidemia    HPI Discussed the use of AI scribe software for clinical note transcription with the patient, who gave verbal consent to proceed.  History of Present Illness Marisa [name] is a 63 year old female with diabetes who presents for a diabetes check.  She monitors her blood glucose at home, with levels ranging from 139 mg/dL to 409 mg/dL, typically higher in the morning. Her current medications include Ozempic  1 mg weekly, metformin  twice daily, and Farxiga  daily. She manages her diet by avoiding potatoes and limiting rice intake. No nausea with Ozempic .  She manages hypertension with olmesartan , amlodipine , and hydrochlorothiazide  taken once daily. Her blood pressure was 139/80 mmHg an hour before the visit but increased upon arrival, which she attributes to stress from traffic. She denies caffeine intake and notes an elevated heart rate without caffeine consumption.  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 She has consulted a foot doctor for these issues and is considering getting a stationary bike to aid in exercise.  She reports feeling better overall, attributing this to the use of a CPAP machine, which has improved her energy levels and reduced  sluggishness. She has lost five pounds since her last visit and is actively trying to lose more weight.  She takes Turbostatin 40 mg daily except Sundays and occasionally uses Linzess  145 mcg as needed. She recently ran out of prescription vitamin D  and is awaiting a refill.   HPI   Past Medical History:  Diagnosis Date   Borderline diabetes    Hypertension      Family History  Problem Relation Age of Onset   Diabetes Mother    Hypertension Mother      Current Outpatient Medications:    ACCU-CHEK GUIDE TEST test strip, USE 1 STRIP TO TEST THREE TIMES A DAY TO CHECK BLOOD SUGARS. (KEEP UNUSED STRIPS IN ORIGINAL SEALED CONTAINER BETWEEN USES), Disp: 100 each, Rfl: 2   Accu-Chek Softclix Lancets lancets, USE LANCET TO TEST THREE TIMES A DAY - (DISCARD LANCET IN APPROPRIATE CONTAINER IMMEDIATELY AFTER USE. USE A NEW LANCET FOR EACH TESTING), Disp: 100 each, Rfl: 2   atorvastatin  (LIPITOR) 40 MG tablet, Take one tablet by mouth everyday except sundays, Disp: 90 tablet, Rfl: 1   Blood Glucose Monitoring Suppl (ACCU-CHEK GUIDE ME) w/Device KIT, Use as directed to check blood sugars three times daily E11.65, Disp: 1 kit, Rfl: 1   dapagliflozin  propanediol (FARXIGA ) 10 MG TABS tablet, TAKE ONE TABLET BY MOUTH EVERY DAY BEFORE BREAKFAST, Disp: 90 tablet, Rfl: 2   folic acid  (FOLVITE ) 1 MG tablet, TAKE ONE TABLET BY MOUTH EVERY DAY, Disp: 90 tablet, Rfl: 1   LINZESS  145 MCG CAPS capsule, TAKE ONE CAPSULE BY MOUTH  EVERY DAY BEFORE FIRST MEAL OF THE DAY, Disp: 90 capsule, Rfl: 1   metFORMIN  (GLUCOPHAGE ) 1000 MG tablet, Take 1 tablet (1,000 mg total) by mouth in the morning and at bedtime., Disp: 180 tablet, Rfl: 2   nebivolol (BYSTOLIC) 5 MG tablet, Take 1 tablet (5 mg total) by mouth daily., Disp: 30 tablet, Rfl: 1   Semaglutide , 1 MG/DOSE, (OZEMPIC , 1 MG/DOSE,) 4 MG/3ML SOPN, INJECT 1 MG SUBCUTANEOUSLY ONCE A WEEK - (ADMINISTER BY SUBCUTANEOUS INJECTION INTO THE ABDOMEN, THIGH, OR UPPER ARM AT ANY  TIME OF DAY ON THE SAME DAY EACH WEEK) DISCARD PEN 56 DAYS AFTER FIRST USE, Disp: 9 mL, Rfl: 3   Vitamin D , Ergocalciferol , (DRISDOL ) 1.25 MG (50000 UNIT) CAPS capsule, TAKE ONE CAPSULE BY MOUTH EVERY SEVEN DAYS (MAY CONTAIN SOY OR PEANUT--TALK TO YOUR DOCTOR BEFORE TAKING IF YOU ARE ALLERGIC), Disp: 12 capsule, Rfl: 0   Olmesartan -amLODIPine -HCTZ 40-10-25 MG TABS, Take 1 tablet by mouth daily., Disp: 90 tablet, Rfl: 1   No Known Allergies   Review of Systems  Constitutional: Negative.   Respiratory: Negative.    Cardiovascular: Negative.   Gastrointestinal: Negative.   Neurological: Negative.   Psychiatric/Behavioral: Negative.       Today's Vitals   05/27/23 1452 05/27/23 1513  BP: (!) 150/100 (!) 150/98  Pulse: (!) 101 97  Temp: 98 F (36.7 C)   SpO2: 98%   Weight: 246 lb 6.4 oz (111.8 kg)   Height: 5' (1.524 m)    Body mass index is 48.12 kg/m.  Wt Readings from Last 3 Encounters:  05/27/23 246 lb 6.4 oz (111.8 kg)  02/19/23 251 lb 9.6 oz (114.1 kg)  10/21/22 250 lb 3.2 oz (113.5 kg)     Objective:  Physical Exam Vitals and nursing note reviewed.  Constitutional:      Appearance: Normal appearance. She is obese.  HENT:     Head: Normocephalic and atraumatic.  Eyes:     Extraocular Movements: Extraocular movements intact.  Cardiovascular:     Rate and Rhythm: Normal rate and regular rhythm.     Heart sounds: Normal heart sounds.  Pulmonary:     Effort: Pulmonary effort is normal.     Breath sounds: Normal breath sounds.  Musculoskeletal:     Cervical back: Normal range of motion.  Skin:    General: Skin is warm.     Findings: Lesion present.     Comments: hyperpigmented papular lesion right hand, thenar area   Neurological:     General: No focal deficit present.     Mental Status: She is alert.  Psychiatric:        Mood and Affect: Mood normal.        Behavior: Behavior normal.         Assessment And Plan:  Dyslipidemia associated with type 2  diabetes mellitus (HCC) Assessment & Plan: Suboptimal glycemic control with home glucose 139-160 mg/dL. Current regimen includes Ozempic , metformin , and Farxiga . Plan to increase Ozempic  for better control and weight loss. Discussed Ozempic 's benefits in reducing cardiac and renal risks. - Increase Ozempic  to 1.5 mg weekly for one month, then 2 mg weekly. - Order A1c test. - She will f/u In 3 months for re-evaluation.   Orders: -     CMP14+EGFR -     Lipid panel -     Hemoglobin A1c -     Microalbumin / creatinine urine ratio  Essential hypertension, benign Assessment & Plan: Hypertension remains uncontrolled with elevated home  and office readings. Current regimen includes olmesartan , amlodipine , and hydrochlorothiazide . Goal is BP <120/80. Discussed 24-hour control to reduce cardiovascular risks. - Prescribe additional antihypertensive for evening dose. - Start Bystolic 5mg  nightly, this will be taken with dinner - Recheck BP in two weeks with nurse visit. - Reminded to follow low sodium diet.   Orders: -     Olmesartan -amLODIPine -HCTZ; Take 1 tablet by mouth daily.  Dispense: 90 tablet; Refill: 1 -     Nebivolol HCl; Take 1 tablet (5 mg total) by mouth daily.  Dispense: 30 tablet; Refill: 1  Pure hypercholesterolemia Assessment & Plan: Chronic, LDL goal is less than 70.  Discussed cardiac risk factors and screening. She is willing to undergo cardiac calcium  score. - Order lipoprotein A test. - Offer cardiac calcium  score for $99.  Orders: -     Lipoprotein A (LPA) -     CT CARDIAC SCORING (SELF PAY ONLY); Future  Tachycardia Assessment & Plan: She is advised to stay well hydrated and to avoid caffeinated beverages. She is encouraged to gradually increase her daily activity to help with conditioning. At this time, she denies having any palpitations.    Skin lesion of hand Assessment & Plan: I will refer her to Derm for further evaluation.   Orders: -     Ambulatory  referral to Dermatology  Vitamin D  deficiency disease Assessment & Plan: I WILL CHECK A VIT D LEVEL AND SUPPLEMENT AS NEEDED.  ALSO ENCOURAGED TO SPEND 15 MINUTES IN THE SUN DAILY.   Orders: -     VITAMIN D  25 Hydroxy (Vit-D Deficiency, Fractures)  Class 3 severe obesity due to excess calories with serious comorbidity and body mass index (BMI) of 45.0 to 49.9 in adult [facility]) Assessment & Plan: BMI 48.  She was congratulated on her 5lb weight loss since her last visit.  She is encouraged to initially strive for BMI less than 40 to decrease cardiac risk. Advised to aim for at least 150 minutes of exercise per week.    OSA on CPAP Assessment & Plan: Managed with CPAP therapy. Reports improved energy and mood, indicating effective management. Reminded to wear for at least four hours per night.     Return in 2 weeks (on 06/10/2023), or bp check - [place], for 3 month dm check.  Patient was given opportunity to ask questions. Patient verbalized understanding of the plan and was able to repeat key elements of the plan. All questions were answered to their satisfaction.    I, Smiley Dung, MD, have reviewed all documentation for this visit. The documentation on 05/27/23 for the exam, diagnosis, procedures, and orders are all accurate and complete.   IF YOU HAVE BEEN REFERRED TO A SPECIALIST, IT MAY TAKE 1-2 WEEKS TO SCHEDULE/PROCESS THE REFERRAL. IF YOU HAVE NOT HEARD FROM US /SPECIALIST IN TWO WEEKS, PLEASE GIVE US  A CALL AT [phone].   THE PATIENT IS ENCOURAGED TO PRACTICE SOCIAL DISTANCING DUE TO THE COVID-19 PANDEMIC.

## 2023-05-27 NOTE — Assessment & Plan Note (Signed)
 Managed with CPAP therapy. Reports improved energy and mood, indicating effective management. Reminded to wear for at least four hours per night.

## 2023-05-27 NOTE — Assessment & Plan Note (Signed)
I will refer her to Derm for further evaluation.

## 2023-05-27 NOTE — Assessment & Plan Note (Signed)
 I WILL CHECK A VIT D LEVEL AND SUPPLEMENT AS NEEDED.  ALSO ENCOURAGED TO SPEND 15 MINUTES IN THE SUN DAILY.

## 2023-05-28 LAB — CMP14+EGFR
ALT: 10 IU/L (ref 0–32)
AST: 14 IU/L (ref 0–40)
Albumin: 4.3 g/dL (ref 3.9–4.9)
Alkaline Phosphatase: 112 IU/L (ref 44–121)
BUN/Creatinine Ratio: 12 (ref 12–28)
BUN: 10 mg/dL (ref 8–27)
Bilirubin Total: 0.5 mg/dL (ref 0.0–1.2)
CO2: 28 mmol/L (ref 20–29)
Calcium: 9.6 mg/dL (ref 8.7–10.3)
Chloride: 98 mmol/L (ref 96–106)
Creatinine, Ser: 0.81 mg/dL (ref 0.57–1.00)
Globulin, Total: 3.4 g/dL (ref 1.5–4.5)
Glucose: 104 mg/dL — ABNORMAL HIGH (ref 70–99)
Potassium: 3.6 mmol/L (ref 3.5–5.2)
Sodium: 141 mmol/L (ref 134–144)
Total Protein: 7.7 g/dL (ref 6.0–8.5)
eGFR: 82 mL/min/{1.73_m2} (ref >59)

## 2023-05-28 LAB — HEMOGLOBIN A1C
Est. average glucose Bld gHb Est-mCnc: 157 mg/dL
Hgb A1c MFr Bld: 7.1 % — ABNORMAL HIGH (ref 4.8–5.6)

## 2023-05-28 LAB — LIPOPROTEIN A (LPA): Lipoprotein (a): 10.9 nmol/L (ref <75.0)

## 2023-05-28 LAB — LIPID PANEL
Chol/HDL Ratio: 4.2 ratio (ref 0.0–4.4)
Cholesterol, Total: 186 mg/dL (ref 100–199)
HDL: 44 mg/dL (ref >39)
LDL Chol Calc (NIH): 114 mg/dL — ABNORMAL HIGH (ref 0–99)
Triglycerides: 159 mg/dL — ABNORMAL HIGH (ref 0–149)
VLDL Cholesterol Cal: 28 mg/dL (ref 5–40)

## 2023-05-28 LAB — MICROALBUMIN / CREATININE URINE RATIO
Creatinine, Urine: 45.7 mg/dL
Microalb/Creat Ratio: 58 mg/g{creat} — ABNORMAL HIGH (ref 0–29)
Microalbumin, Urine: 26.7 ug/mL

## 2023-05-28 LAB — VITAMIN D 25 HYDROXY (VIT D DEFICIENCY, FRACTURES): Vit D, 25-Hydroxy: 23.4 ng/mL — ABNORMAL LOW (ref 30.0–100.0)

## 2023-05-29 ENCOUNTER — Encounter: Payer: Self-pay | Admitting: Internal Medicine

## 2023-06-04 ENCOUNTER — Ambulatory Visit (HOSPITAL_BASED_OUTPATIENT_CLINIC_OR_DEPARTMENT_OTHER)
Admission: RE | Admit: 2023-06-04 | Discharge: 2023-06-04 | Disposition: A | Discharge status: Home / Self Care | Payer: Self-pay | Source: Ambulatory Visit | Attending: Internal Medicine | Admitting: Internal Medicine

## 2023-06-04 DIAGNOSIS — E7800 Pure hypercholesterolemia, unspecified: Secondary | ICD-10-CM | POA: Insufficient documentation

## 2023-06-08 ENCOUNTER — Encounter: Payer: Self-pay | Admitting: Internal Medicine

## 2023-06-09 NOTE — Patient Instructions (Incomplete)
 Hypertension, Adult Hypertension is another name for high blood pressure. High blood pressure forces your heart to work harder to pump blood. This can cause problems over time. There are two numbers in a blood pressure reading. There is a top number (systolic) over a bottom number (diastolic). It is best to have a blood pressure that is below 120/80. What are the causes? The cause of this condition is not known. Some other conditions can lead to high blood pressure. What increases the risk? Some lifestyle factors can make you more likely to develop high blood pressure: Smoking. Not getting enough exercise or physical activity. Being overweight. Having too much fat, sugar, calories, or salt (sodium) in your diet. Drinking too much alcohol. Other risk factors include: Having any of these conditions: Heart disease. Diabetes. High cholesterol. Kidney disease. Obstructive sleep apnea. Having a family history of high blood pressure and high cholesterol. Age. The risk increases with age. Stress. What are the signs or symptoms? High blood pressure may not cause symptoms. Very high blood pressure (hypertensive crisis) may cause: Headache. Fast or uneven heartbeats (palpitations). Shortness of breath. Nosebleed. Vomiting or feeling like you may vomit (nauseous). Changes in how you see. Very bad chest pain. Feeling dizzy. Seizures. How is this treated? This condition is treated by making healthy lifestyle changes, such as: Eating healthy foods. Exercising more. Drinking less alcohol. Your doctor may prescribe medicine if lifestyle changes do not help enough and if: Your top number is above 130. Your bottom number is above 80. Your personal target blood pressure may vary. Follow these instructions at home: Eating and drinking  If told, follow the DASH eating plan. To follow this plan: Fill one half of your plate at each meal with fruits and vegetables. Fill one fourth of your plate  at each meal with whole grains. Whole grains include whole-wheat pasta, brown rice, and whole-grain bread. Eat or drink low-fat dairy products, such as skim milk or low-fat yogurt. Fill one fourth of your plate at each meal with low-fat (lean) proteins. Low-fat proteins include fish, chicken without skin, eggs, beans, and tofu. Avoid fatty meat, cured and processed meat, or chicken with skin. Avoid pre-made or processed food. Limit the amount of salt in your diet to less than 1,500 mg each day. Do not drink alcohol if: Your doctor tells you not to drink. You are pregnant, may be pregnant, or are planning to become pregnant. If you drink alcohol: Limit how much you have to: 0-1 drink a day for women. 0-2 drinks a day for men. Know how much alcohol is in your drink. In the [place] one drink equals one 12 oz bottle of beer (355 mL), one 5 oz glass of wine (148 mL), or one 1 oz glass of hard liquor (44 mL). Lifestyle  Work with your doctor to stay at a healthy weight or to lose weight. Ask your doctor what the best weight is for you. Get at least 30 minutes of exercise that causes your heart to beat faster (aerobic exercise) most days of the week. This may include walking, swimming, or biking. Get at least 30 minutes of exercise that strengthens your muscles (resistance exercise) at least 3 days a week. This may include lifting weights or doing Pilates. Do not smoke or use any products that contain nicotine or tobacco. If you need help quitting, ask your doctor. Check your blood pressure at home as told by your doctor. Keep all follow-up visits. Medicines Take over-the-counter and prescription medicines  only as told by your doctor. Follow directions carefully. Do not skip doses of blood pressure medicine. The medicine does not work as well if you skip doses. Skipping doses also puts you at risk for problems. Ask your doctor about side effects or reactions to medicines that you should watch  for. Contact a doctor if: You think you are having a reaction to the medicine you are taking. You have headaches that keep coming back. You feel dizzy. You have swelling in your ankles. You have trouble with your vision. Get help right away if: You get a very bad headache. You start to feel mixed up (confused). You feel weak or numb. You feel faint. You have very bad pain in your: Chest. Belly (abdomen). You vomit more than once. You have trouble breathing. These symptoms may be an emergency. Get help right away. Call 911. Do not wait to see if the symptoms will go away. Do not drive yourself to the hospital. Summary Hypertension is another name for high blood pressure. High blood pressure forces your heart to work harder to pump blood. For most people, a normal blood pressure is less than 120/80. Making healthy choices can help lower blood pressure. If your blood pressure does not get lower with healthy choices, you may need to take medicine. This information is not intended to replace advice given to you by your health care provider. Make sure you discuss any questions you have with your health care provider. Document Revised: 11/02/2020 Document Reviewed: 11/02/2020 Elsevier Patient Education  2024 [employer].

## 2023-06-09 NOTE — Progress Notes (Deleted)
 I,Jaisean Monteforte T Basil Lim, CMA,acting as a Neurosurgeon for Smiley Dung, MD.,have documented all relevant documentation on the behalf of Smiley Dung, MD,as directed by  Smiley Dung, MD while in the presence of Smiley Dung, MD.  Subjective:  Patient ID: Marisa [name] , female    DOB: 1960-06-26 , 63 y.o.   MRN: [id number]  No chief complaint on file.   HPI  HPI   Past Medical History:  Diagnosis Date  . Borderline diabetes   . Hypertension      Family History  Problem Relation Age of Onset  . Diabetes Mother   . Hypertension Mother      Current Outpatient Medications:  .  ACCU-CHEK GUIDE TEST test strip, USE 1 STRIP TO TEST THREE TIMES A DAY TO CHECK BLOOD SUGARS. (KEEP UNUSED STRIPS IN ORIGINAL SEALED CONTAINER BETWEEN USES), Disp: 100 each, Rfl: 2 .  Accu-Chek Softclix Lancets lancets, USE LANCET TO TEST THREE TIMES A DAY - (DISCARD LANCET IN APPROPRIATE CONTAINER IMMEDIATELY AFTER USE. USE A NEW LANCET FOR EACH TESTING), Disp: 100 each, Rfl: 2 .  atorvastatin  (LIPITOR) 40 MG tablet, Take one tablet by mouth everyday except sundays, Disp: 90 tablet, Rfl: 1 .  Blood Glucose Monitoring Suppl (ACCU-CHEK GUIDE ME) w/Device KIT, Use as directed to check blood sugars three times daily E11.65, Disp: 1 kit, Rfl: 1 .  dapagliflozin  propanediol (FARXIGA ) 10 MG TABS tablet, TAKE ONE TABLET BY MOUTH EVERY DAY BEFORE BREAKFAST, Disp: 90 tablet, Rfl: 2 .  folic acid  (FOLVITE ) 1 MG tablet, TAKE ONE TABLET BY MOUTH EVERY DAY, Disp: 90 tablet, Rfl: 1 .  LINZESS  145 MCG CAPS capsule, TAKE ONE CAPSULE BY MOUTH EVERY DAY BEFORE FIRST MEAL OF THE DAY, Disp: 90 capsule, Rfl: 1 .  metFORMIN  (GLUCOPHAGE ) 1000 MG tablet, Take 1 tablet (1,000 mg total) by mouth in the morning and at bedtime., Disp: 180 tablet, Rfl: 2 .  nebivolol  (BYSTOLIC ) 5 MG tablet, Take 1 tablet (5 mg total) by mouth daily., Disp: 30 tablet, Rfl: 1 .  Olmesartan -amLODIPine -HCTZ 40-10-25 MG TABS, Take 1 tablet by mouth  daily., Disp: 90 tablet, Rfl: 1 .  Semaglutide , 1 MG/DOSE, (OZEMPIC , 1 MG/DOSE,) 4 MG/3ML SOPN, INJECT 1 MG SUBCUTANEOUSLY ONCE A WEEK - (ADMINISTER BY SUBCUTANEOUS INJECTION INTO THE ABDOMEN, THIGH, OR UPPER ARM AT ANY TIME OF DAY ON THE SAME DAY EACH WEEK) DISCARD PEN 56 DAYS AFTER FIRST USE, Disp: 9 mL, Rfl: 3 .  Vitamin D , Ergocalciferol , (DRISDOL ) 1.25 MG (50000 UNIT) CAPS capsule, TAKE ONE CAPSULE BY MOUTH EVERY SEVEN DAYS (MAY CONTAIN SOY OR PEANUT--TALK TO YOUR DOCTOR BEFORE TAKING IF YOU ARE ALLERGIC), Disp: 12 capsule, Rfl: 0   No Known Allergies   Review of Systems  Constitutional: Negative.   Respiratory: Negative.    Cardiovascular: Negative.   Neurological: Negative.   Psychiatric/Behavioral: Negative.      There were no vitals filed for this visit. There is no height or weight on file to calculate BMI.  Wt Readings from Last 3 Encounters:  05/27/23 246 lb 6.4 oz (111.8 kg)  02/19/23 251 lb 9.6 oz (114.1 kg)  10/21/22 250 lb 3.2 oz (113.5 kg)     Objective:  Physical Exam      Assessment And Plan:  Essential hypertension, benign     No follow-ups on file.  Patient was given opportunity to ask questions. Patient verbalized understanding of the plan and was able to repeat key elements of the plan. All questions  were answered to their satisfaction.  Smiley Dung, MD  I, Smiley Dung, MD, have reviewed all documentation for this visit. The documentation on 06/09/23 for the exam, diagnosis, procedures, and orders are all accurate and complete.   IF YOU HAVE BEEN REFERRED TO A SPECIALIST, IT MAY TAKE 1-2 WEEKS TO SCHEDULE/PROCESS THE REFERRAL. IF YOU HAVE NOT HEARD FROM US /SPECIALIST IN TWO WEEKS, PLEASE GIVE US  A CALL AT [phone].   THE PATIENT IS ENCOURAGED TO PRACTICE SOCIAL DISTANCING DUE TO THE COVID-19 PANDEMIC.

## 2023-06-10 ENCOUNTER — Ambulatory Visit: Admitting: Internal Medicine

## 2023-06-10 DIAGNOSIS — I10 Essential (primary) hypertension: Secondary | ICD-10-CM

## 2023-06-20 ENCOUNTER — Ambulatory Visit

## 2023-06-20 DIAGNOSIS — I10 Essential (primary) hypertension: Secondary | ICD-10-CM

## 2023-06-20 MED ORDER — NEBIVOLOL HCL 5 MG PO TABS: 5.0000 mg | ORAL_TABLET | Freq: Every day | ORAL | 2 refills | Status: DC

## 2023-06-20 NOTE — Progress Notes (Signed)
 Patient is in office today for a nurse visit for Blood Pressure Check. Patient blood pressure was 150/90, Patient No chest pain, No shortness of breath, No dyspnea on exertion, No orthopnea, No paroxysmal nocturnal dyspnea, No edema, No palpitations, No syncope.  Patient currently taking nebivolol  5mg  PM- She has been out of it for a week now she reports her numbers at home were better when she was taking it, also taking olmesartan -amlodipine -hydrochlorothiazide  40-10-25mg  AM.  BP Readings from Last 3 Encounters:  06/20/23 (!) 150/90  05/27/23 (!) 150/98  02/19/23 134/86   Per Provider- [place] BP check in 3 weeks, take Nebivolol  daily as prescribed.  Patient is requesting a 30 day supply of Nevbivolol to [facility] since she has to pay for it from [place] and needs it today.

## 2023-07-01 ENCOUNTER — Telehealth: Payer: Self-pay | Admitting: Neurology

## 2023-07-01 NOTE — Telephone Encounter (Signed)
 LVM and sent MyChart msg informing pt of r/s needed for 7/24 appt- NP out.

## 2023-07-08 ENCOUNTER — Ambulatory Visit (INDEPENDENT_AMBULATORY_CARE_PROVIDER_SITE_OTHER): Admitting: Internal Medicine

## 2023-07-08 ENCOUNTER — Ambulatory Visit

## 2023-07-08 ENCOUNTER — Encounter: Payer: Self-pay | Admitting: Internal Medicine

## 2023-07-08 VITALS — BP 140/82 | HR 99 | Temp 98.2°F | Ht 60.0 in | Wt 246.0 lb

## 2023-07-08 DIAGNOSIS — Z6841 Body Mass Index (BMI) 40.0 and over, adult: Secondary | ICD-10-CM | POA: Diagnosis not present

## 2023-07-08 DIAGNOSIS — I10 Essential (primary) hypertension: Secondary | ICD-10-CM

## 2023-07-08 DIAGNOSIS — E66813 Obesity, class 3: Secondary | ICD-10-CM

## 2023-07-08 DIAGNOSIS — R202 Paresthesia of skin: Secondary | ICD-10-CM | POA: Insufficient documentation

## 2023-07-08 MED ORDER — GABAPENTIN 100 MG PO CAPS: 100.0000 mg | ORAL_CAPSULE | Freq: Every day | ORAL | 2 refills | Status: DC

## 2023-07-08 MED ORDER — NEBIVOLOL HCL 10 MG PO TABS: 10.0000 mg | ORAL_TABLET | Freq: Every day | ORAL | 1 refills | Status: DC

## 2023-07-08 NOTE — Assessment & Plan Note (Signed)
 BMI 48.  She is encouraged to initially strive for BMI less than 40 to decrease cardiac risk.  - I again stressed the importance of regular exercise.  - Advised to aim for at least 150 minutes of exercise per week.

## 2023-07-08 NOTE — Assessment & Plan Note (Signed)
 Chronic, uncontrolled.  Blood pressure elevated on last three visits. Currently on olmesartan /amlodipine /hydrochlorothiazide   and Bystolic . Increased Bystolic  dosage due to lack of regular exercise and dietary sodium intake. Advised lifestyle modifications. - Increase Bystolic  dosage to 10 mg by taking two 5mg  tablets in the evening until she runs out of current supply - Send a new prescription to the local pharmacy for Bystolic  10mg  daily. - Schedule a nurse visit in two weeks to monitor blood pressure. - Advise reducing sodium intake by avoiding foods in bags or boxes, bread, and cheese. - Encourage exercise, suggesting swimming to avoid joint strain.

## 2023-07-08 NOTE — Patient Instructions (Signed)
 Hypertension, Adult Hypertension is another name for high blood pressure. High blood pressure forces your heart to work harder to pump blood. This can cause problems over time. There are two numbers in a blood pressure reading. There is a top number (systolic) over a bottom number (diastolic). It is best to have a blood pressure that is below 120/80. What are the causes? The cause of this condition is not known. Some other conditions can lead to high blood pressure. What increases the risk? Some lifestyle factors can make you more likely to develop high blood pressure: Smoking. Not getting enough exercise or physical activity. Being overweight. Having too much fat, sugar, calories, or salt (sodium) in your diet. Drinking too much alcohol. Other risk factors include: Having any of these conditions: Heart disease. Diabetes. High cholesterol. Kidney disease. Obstructive sleep apnea. Having a family history of high blood pressure and high cholesterol. Age. The risk increases with age. Stress. What are the signs or symptoms? High blood pressure may not cause symptoms. Very high blood pressure (hypertensive crisis) may cause: Headache. Fast or uneven heartbeats (palpitations). Shortness of breath. Nosebleed. Vomiting or feeling like you may vomit (nauseous). Changes in how you see. Very bad chest pain. Feeling dizzy. Seizures. How is this treated? This condition is treated by making healthy lifestyle changes, such as: Eating healthy foods. Exercising more. Drinking less alcohol. Your doctor may prescribe medicine if lifestyle changes do not help enough and if: Your top number is above 130. Your bottom number is above 80. Your personal target blood pressure may vary. Follow these instructions at home: Eating and drinking  If told, follow the DASH eating plan. To follow this plan: Fill one half of your plate at each meal with fruits and vegetables. Fill one fourth of your plate  at each meal with whole grains. Whole grains include whole-wheat pasta, brown rice, and whole-grain bread. Eat or drink low-fat dairy products, such as skim milk or low-fat yogurt. Fill one fourth of your plate at each meal with low-fat (lean) proteins. Low-fat proteins include fish, chicken without skin, eggs, beans, and tofu. Avoid fatty meat, cured and processed meat, or chicken with skin. Avoid pre-made or processed food. Limit the amount of salt in your diet to less than 1,500 mg each day. Do not drink alcohol if: Your doctor tells you not to drink. You are pregnant, may be pregnant, or are planning to become pregnant. If you drink alcohol: Limit how much you have to: 0-1 drink a day for women. 0-2 drinks a day for men. Know how much alcohol is in your drink. In the [place] one drink equals one 12 oz bottle of beer (355 mL), one 5 oz glass of wine (148 mL), or one 1 oz glass of hard liquor (44 mL). Lifestyle  Work with your doctor to stay at a healthy weight or to lose weight. Ask your doctor what the best weight is for you. Get at least 30 minutes of exercise that causes your heart to beat faster (aerobic exercise) most days of the week. This may include walking, swimming, or biking. Get at least 30 minutes of exercise that strengthens your muscles (resistance exercise) at least 3 days a week. This may include lifting weights or doing Pilates. Do not smoke or use any products that contain nicotine or tobacco. If you need help quitting, ask your doctor. Check your blood pressure at home as told by your doctor. Keep all follow-up visits. Medicines Take over-the-counter and prescription medicines  only as told by your doctor. Follow directions carefully. Do not skip doses of blood pressure medicine. The medicine does not work as well if you skip doses. Skipping doses also puts you at risk for problems. Ask your doctor about side effects or reactions to medicines that you should watch  for. Contact a doctor if: You think you are having a reaction to the medicine you are taking. You have headaches that keep coming back. You feel dizzy. You have swelling in your ankles. You have trouble with your vision. Get help right away if: You get a very bad headache. You start to feel mixed up (confused). You feel weak or numb. You feel faint. You have very bad pain in your: Chest. Belly (abdomen). You vomit more than once. You have trouble breathing. These symptoms may be an emergency. Get help right away. Call 911. Do not wait to see if the symptoms will go away. Do not drive yourself to the hospital. Summary Hypertension is another name for high blood pressure. High blood pressure forces your heart to work harder to pump blood. For most people, a normal blood pressure is less than 120/80. Making healthy choices can help lower blood pressure. If your blood pressure does not get lower with healthy choices, you may need to take medicine. This information is not intended to replace advice given to you by your health care provider. Make sure you discuss any questions you have with your health care provider. Document Revised: 11/02/2020 Document Reviewed: 11/02/2020 Elsevier Patient Education  2024 [employer].

## 2023-07-08 NOTE — Progress Notes (Signed)
 I,Jameka J Llittleton, CMA,acting as a Neurosurgeon for Marisa Dung, MD.,have documented all relevant documentation on the behalf of Marisa Dung, MD,as directed by  Marisa Dung, MD while in the presence of Marisa Dung, MD.  Subjective:  Patient ID: Marisa [name] , female    DOB: 02-06-1960 , 63 y.o.   MRN: [id number]  Chief Complaint  Patient presents with   Hypertension    Patient presents today for a bpc. Patient reports compliance with her meds.   facial tingling    Patient complains of facial tingling. She reports she is unsure why her face feels this way. She stated she reached out to the dentist and they told her to call us  for a referral to neurology for possible trigeminal neuralgia.     HPI Discussed the use of AI scribe software for clinical note transcription with the patient, who gave verbal consent to proceed.  History of Present Illness Marisa [name] is a 63 year old female who initially presented for NV bp check. She then requested Neuro referral for further evaluation of facial tingling and discomfort following a dental procedure.  She experiences tingling and discomfort on the left side of her face following a dental procedure two weeks ago. Initially, she attributed these sensations to the numbing medication used during the procedure, which included a cleaning and filling done on the same day. Despite returning to the dentist, she was informed that her teeth were not the source of the issue.  The sensation is described as feeling like she has been 'slapped in the face,' with stinging and tingling triggered by touching her face. The symptoms do not occur spontaneously and are not triggered by activities such as chewing or brushing her teeth. No associated headaches, muscle spasms in the jaw, or changes in taste are reported.  She uses a CPAP machine nightly for at least four hours and has noted lines on her forehead from the mask. She has not observed  any rashes on her face and denies any recent falls, neck pain, or hearing problems. She recalls having chickenpox in second grade.  Her social history includes occasional beer consumption, about once a month, and she does not drink alcohol regularly. She is not currently engaging in regular exercise, although she has attempted walking, which is sometimes limited by ankle discomfort.  She is currently taking olmesartan  40/10/25 mg daily in the morning. Her blood pressure has been high during recent checks.   She is here today for diabetes and BP f/u.  She reports compliance with meds.  She denies headaches, chest pain and shortness of breath. Patient states she has been working out everyday at the [employer].       Hypertension This is a chronic problem. The current episode started more than 1 year ago. The problem has been gradually improving since onset. The problem is uncontrolled. Pertinent negatives include no palpitations. Risk factors for coronary artery disease include diabetes mellitus, dyslipidemia, obesity, post-menopausal state and sedentary lifestyle. The current treatment provides moderate improvement. Compliance problems include exercise.      Past Medical History:  Diagnosis Date   Borderline diabetes    Hypertension      Family History  Problem Relation Age of Onset   Diabetes Mother    Hypertension Mother      Current Outpatient Medications:    gabapentin (NEURONTIN) 100 MG capsule, Take 1 capsule (100 mg total) by mouth daily., Disp: 30 capsule, Rfl: 2  nebivolol  (BYSTOLIC ) 10 MG tablet, Take 1 tablet (10 mg total) by mouth daily., Disp: 30 tablet, Rfl: 1   ACCU-CHEK GUIDE TEST test strip, USE 1 STRIP TO TEST THREE TIMES A DAY TO CHECK BLOOD SUGARS. (KEEP UNUSED STRIPS IN ORIGINAL SEALED CONTAINER BETWEEN USES), Disp: 100 each, Rfl: 2   Accu-Chek Softclix Lancets lancets, USE LANCET TO TEST THREE TIMES A DAY - (DISCARD LANCET IN APPROPRIATE CONTAINER IMMEDIATELY  AFTER USE. USE A NEW LANCET FOR EACH TESTING), Disp: 100 each, Rfl: 2   atorvastatin  (LIPITOR) 40 MG tablet, Take one tablet by mouth everyday except sundays, Disp: 90 tablet, Rfl: 1   Blood Glucose Monitoring Suppl (ACCU-CHEK GUIDE ME) w/Device KIT, Use as directed to check blood sugars three times daily E11.65, Disp: 1 kit, Rfl: 1   dapagliflozin  propanediol (FARXIGA ) 10 MG TABS tablet, TAKE ONE TABLET BY MOUTH EVERY DAY BEFORE BREAKFAST, Disp: 90 tablet, Rfl: 2   folic acid  (FOLVITE ) 1 MG tablet, TAKE ONE TABLET BY MOUTH EVERY DAY, Disp: 90 tablet, Rfl: 1   LINZESS  145 MCG CAPS capsule, TAKE ONE CAPSULE BY MOUTH EVERY DAY BEFORE FIRST MEAL OF THE DAY, Disp: 90 capsule, Rfl: 1   metFORMIN  (GLUCOPHAGE ) 1000 MG tablet, Take 1 tablet (1,000 mg total) by mouth in the morning and at bedtime., Disp: 180 tablet, Rfl: 2   Olmesartan -amLODIPine -HCTZ 40-10-25 MG TABS, Take 1 tablet by mouth daily., Disp: 90 tablet, Rfl: 1   Semaglutide , 1 MG/DOSE, (OZEMPIC , 1 MG/DOSE,) 4 MG/3ML SOPN, INJECT 1 MG SUBCUTANEOUSLY ONCE A WEEK - (ADMINISTER BY SUBCUTANEOUS INJECTION INTO THE ABDOMEN, THIGH, OR UPPER ARM AT ANY TIME OF DAY ON THE SAME DAY EACH WEEK) DISCARD PEN 56 DAYS AFTER FIRST USE, Disp: 9 mL, Rfl: 3   Vitamin D , Ergocalciferol , (DRISDOL ) 1.25 MG (50000 UNIT) CAPS capsule, TAKE ONE CAPSULE BY MOUTH EVERY SEVEN DAYS (MAY CONTAIN SOY OR PEANUT--TALK TO YOUR DOCTOR BEFORE TAKING IF YOU ARE ALLERGIC), Disp: 12 capsule, Rfl: 0   No Known Allergies   Review of Systems  Constitutional: Negative.   Respiratory: Negative.    Cardiovascular: Negative.  Negative for palpitations.  Gastrointestinal: Negative.   Neurological: Negative.   Psychiatric/Behavioral: Negative.       Today's Vitals   07/08/23 1027 07/08/23 1132  BP: (!) 140/80 (!) 140/82  Pulse: 100 99  Temp: 98.2 F (36.8 C)   TempSrc: Oral   Weight: 246 lb (111.6 kg)   Height: 5' (1.524 m)   PainSc: 0-No pain    Body mass index is 48.04 kg/m.   Wt Readings from Last 3 Encounters:  07/08/23 246 lb (111.6 kg)  06/20/23 246 lb (111.6 kg)  05/27/23 246 lb 6.4 oz (111.8 kg)    BP Readings from Last 3 Encounters:  07/08/23 (!) 140/82  07/08/23 (!) 140/82  06/20/23 (!) 150/96     The 10-year ASCVD risk score (Arnett DK, et al., 2019) is: 23.1%   Values used to calculate the score:     Age: 33 years     Sex: Female     Is Non-Hispanic African American: Yes     Diabetic: Yes     Tobacco smoker: No     Systolic Blood Pressure: 140 mmHg     Is BP treated: Yes     HDL Cholesterol: 44 mg/dL     Total Cholesterol: 186 mg/dL  Objective:  Physical Exam Vitals and nursing note reviewed.  Constitutional:      Appearance: Normal appearance.  HENT:  Head: Normocephalic and atraumatic.     Comments: Parotid gland swelling on the left Eyes:     Extraocular Movements: Extraocular movements intact.  Cardiovascular:     Rate and Rhythm: Normal rate and regular rhythm.     Heart sounds: Normal heart sounds.  Pulmonary:     Effort: Pulmonary effort is normal.     Breath sounds: Normal breath sounds.  Skin:    General: Skin is warm.  Neurological:     General: No focal deficit present.     Mental Status: She is alert.     Comments: She is able to puff her cheeks bilaterally and squeeze both eyes.  Normal speech  Psychiatric:        Mood and Affect: Mood normal.        Behavior: Behavior normal.         Assessment And Plan:  Essential hypertension, benign Assessment & Plan: Chronic, uncontrolled.  Blood pressure elevated on last three visits. Currently on olmesartan /amlodipine /hydrochlorothiazide   and Bystolic . Increased Bystolic  dosage due to lack of regular exercise and dietary sodium intake. Advised lifestyle modifications. - Increase Bystolic  dosage to 10 mg by taking two 5mg  tablets in the evening until she runs out of current supply - Send a new prescription to the local pharmacy for Bystolic  10mg  daily. -  Schedule a nurse visit in two weeks to monitor blood pressure. - Advise reducing sodium intake by avoiding foods in bags or boxes, bread, and cheese. - Encourage exercise, suggesting swimming to avoid joint strain.   Facial tingling Assessment & Plan: Left-sided facial tingling and numbness post-dental procedure. Differential includes trigeminal neuralgia and less likely Bell's palsy. Possible relation to dental anesthetic or nerve issue. CT scan planned to rule out intracranial pathology. - Prescribe gabapentin for nerve issues to be taken at night. - Order CT scan of the brain to rule out intracranial pathology. - Consider MRI of the brain if CT results are inconclusive. - Recommend neurology evaluation for further assessment.  Orders: -     CT HEAD WO CONTRAST ( ); Future -     Ambulatory referral to Neurology  Class 3 severe obesity due to excess calories with serious comorbidity and body mass index (BMI) of 45.0 to 49.9 in adult Assessment & Plan: BMI 48.  She is encouraged to initially strive for BMI less than 40 to decrease cardiac risk.  - I again stressed the importance of regular exercise.  - Advised to aim for at least 150 minutes of exercise per week.    Other orders -     Gabapentin; Take 1 capsule (100 mg total) by mouth daily.  Dispense: 30 capsule; Refill: 2 -     Nebivolol  HCl; Take 1 tablet (10 mg total) by mouth daily.  Dispense: 30 tablet; Refill: 1   Return in 2 weeks (on 07/22/2023), or NV-bp check.  Patient was given opportunity to ask questions. Patient verbalized understanding of the plan and was able to repeat key elements of the plan. All questions were answered to their satisfaction.    I, Marisa Dung, MD, have reviewed all documentation for this visit. The documentation on 07/08/23 for the exam, diagnosis, procedures, and orders are all accurate and complete.   IF YOU HAVE BEEN REFERRED TO A SPECIALIST, IT MAY TAKE 1-2 WEEKS TO SCHEDULE/PROCESS  THE REFERRAL. IF YOU HAVE NOT HEARD FROM US /SPECIALIST IN TWO WEEKS, PLEASE GIVE US  A CALL AT [phone] X [phone].

## 2023-07-08 NOTE — Assessment & Plan Note (Signed)
 Left-sided facial tingling and numbness post-dental procedure. Differential includes trigeminal neuralgia and less likely Bell's palsy. Possible relation to dental anesthetic or nerve issue. CT scan planned to rule out intracranial pathology. - Prescribe gabapentin for nerve issues to be taken at night. - Order CT scan of the brain to rule out intracranial pathology. - Consider MRI of the brain if CT results are inconclusive. - Recommend neurology evaluation for further assessment.

## 2023-07-14 ENCOUNTER — Encounter: Payer: Self-pay | Admitting: Internal Medicine

## 2023-07-14 NOTE — Telephone Encounter (Signed)
 Pt has called and r/s the appointment

## 2023-07-22 ENCOUNTER — Other Ambulatory Visit: Payer: Self-pay | Admitting: Internal Medicine

## 2023-07-22 ENCOUNTER — Ambulatory Visit

## 2023-07-22 VITALS — BP 142/86 | HR 73 | Temp 98.1°F | Ht 60.0 in | Wt 246.0 lb

## 2023-07-22 DIAGNOSIS — I10 Essential (primary) hypertension: Secondary | ICD-10-CM

## 2023-07-22 NOTE — Patient Instructions (Signed)
 Hypertension, Adult Hypertension is another name for high blood pressure. High blood pressure forces your heart to work harder to pump blood. This can cause problems over time. There are two numbers in a blood pressure reading. There is a top number (systolic) over a bottom number (diastolic). It is best to have a blood pressure that is below 120/80. What are the causes? The cause of this condition is not known. Some other conditions can lead to high blood pressure. What increases the risk? Some lifestyle factors can make you more likely to develop high blood pressure: Smoking. Not getting enough exercise or physical activity. Being overweight. Having too much fat, sugar, calories, or salt (sodium) in your diet. Drinking too much alcohol. Other risk factors include: Having any of these conditions: Heart disease. Diabetes. High cholesterol. Kidney disease. Obstructive sleep apnea. Having a family history of high blood pressure and high cholesterol. Age. The risk increases with age. Stress. What are the signs or symptoms? High blood pressure may not cause symptoms. Very high blood pressure (hypertensive crisis) may cause: Headache. Fast or uneven heartbeats (palpitations). Shortness of breath. Nosebleed. Vomiting or feeling like you may vomit (nauseous). Changes in how you see. Very bad chest pain. Feeling dizzy. Seizures. How is this treated? This condition is treated by making healthy lifestyle changes, such as: Eating healthy foods. Exercising more. Drinking less alcohol. Your doctor may prescribe medicine if lifestyle changes do not help enough and if: Your top number is above 130. Your bottom number is above 80. Your personal target blood pressure may vary. Follow these instructions at home: Eating and drinking  If told, follow the DASH eating plan. To follow this plan: Fill one half of your plate at each meal with fruits and vegetables. Fill one fourth of your plate  at each meal with whole grains. Whole grains include whole-wheat pasta, brown rice, and whole-grain bread. Eat or drink low-fat dairy products, such as skim milk or low-fat yogurt. Fill one fourth of your plate at each meal with low-fat (lean) proteins. Low-fat proteins include fish, chicken without skin, eggs, beans, and tofu. Avoid fatty meat, cured and processed meat, or chicken with skin. Avoid pre-made or processed food. Limit the amount of salt in your diet to less than 1,500 mg each day. Do not drink alcohol if: Your doctor tells you not to drink. You are pregnant, may be pregnant, or are planning to become pregnant. If you drink alcohol: Limit how much you have to: 0-1 drink a day for women. 0-2 drinks a day for men. Know how much alcohol is in your drink. In the [place] one drink equals one 12 oz bottle of beer (355 mL), one 5 oz glass of wine (148 mL), or one 1 oz glass of hard liquor (44 mL). Lifestyle  Work with your doctor to stay at a healthy weight or to lose weight. Ask your doctor what the best weight is for you. Get at least 30 minutes of exercise that causes your heart to beat faster (aerobic exercise) most days of the week. This may include walking, swimming, or biking. Get at least 30 minutes of exercise that strengthens your muscles (resistance exercise) at least 3 days a week. This may include lifting weights or doing Pilates. Do not smoke or use any products that contain nicotine or tobacco. If you need help quitting, ask your doctor. Check your blood pressure at home as told by your doctor. Keep all follow-up visits. Medicines Take over-the-counter and prescription medicines  only as told by your doctor. Follow directions carefully. Do not skip doses of blood pressure medicine. The medicine does not work as well if you skip doses. Skipping doses also puts you at risk for problems. Ask your doctor about side effects or reactions to medicines that you should watch  for. Contact a doctor if: You think you are having a reaction to the medicine you are taking. You have headaches that keep coming back. You feel dizzy. You have swelling in your ankles. You have trouble with your vision. Get help right away if: You get a very bad headache. You start to feel mixed up (confused). You feel weak or numb. You feel faint. You have very bad pain in your: Chest. Belly (abdomen). You vomit more than once. You have trouble breathing. These symptoms may be an emergency. Get help right away. Call 911. Do not wait to see if the symptoms will go away. Do not drive yourself to the hospital. Summary Hypertension is another name for high blood pressure. High blood pressure forces your heart to work harder to pump blood. For most people, a normal blood pressure is less than 120/80. Making healthy choices can help lower blood pressure. If your blood pressure does not get lower with healthy choices, you may need to take medicine. This information is not intended to replace advice given to you by your health care provider. Make sure you discuss any questions you have with your health care provider. Document Revised: 11/02/2020 Document Reviewed: 11/02/2020 Elsevier Patient Education  2024 [employer].

## 2023-07-22 NOTE — Progress Notes (Signed)
 Patient presents today for bpc. She currently takes Bystolic  10MG  in the evening & Olmesartan -Amlodipine -hydrochlorothiazide  40-10-25MG  in the morning. Denies headache, chest pain & sob. She drinks at least 6 bottles a day. Denies any exercise currently. Initial bp: 144/88. Bp taken again after 10 minutes:  BP Readings from Last 3 Encounters:  07/22/23 (!) 142/86  07/08/23 (!) 140/82  07/08/23 (!) 140/82  She states also taking bp readings this morning around 7am: 138/82. Per provider goal is less than 120/80. She must increase her daily activity. She will also be referred to the hypertension clinic for further evaluation. She is on four meds. & not yet at goal. It is also important to avoid adding salt to her foods and to cut out processed meats including bacon, sausage and deli meats. Patient aware.

## 2023-07-29 ENCOUNTER — Ambulatory Visit (HOSPITAL_BASED_OUTPATIENT_CLINIC_OR_DEPARTMENT_OTHER)
Admission: RE | Admit: 2023-07-29 | Discharge: 2023-07-29 | Disposition: A | Discharge status: Home / Self Care | Source: Ambulatory Visit | Attending: Internal Medicine | Admitting: Internal Medicine

## 2023-07-29 DIAGNOSIS — R202 Paresthesia of skin: Secondary | ICD-10-CM | POA: Insufficient documentation

## 2023-08-03 ENCOUNTER — Ambulatory Visit: Payer: Self-pay | Admitting: Internal Medicine

## 2023-08-21 ENCOUNTER — Ambulatory Visit: Admitting: Neurology

## 2023-09-01 ENCOUNTER — Ambulatory Visit (INDEPENDENT_AMBULATORY_CARE_PROVIDER_SITE_OTHER): Admitting: Internal Medicine

## 2023-09-01 ENCOUNTER — Encounter: Payer: Self-pay | Admitting: Internal Medicine

## 2023-09-01 VITALS — BP 130/80 | HR 95 | Temp 98.2°F | Ht 60.0 in | Wt 246.0 lb

## 2023-09-01 DIAGNOSIS — E785 Hyperlipidemia, unspecified: Secondary | ICD-10-CM

## 2023-09-01 DIAGNOSIS — E66813 Obesity, class 3: Secondary | ICD-10-CM

## 2023-09-01 DIAGNOSIS — I10 Essential (primary) hypertension: Secondary | ICD-10-CM | POA: Diagnosis not present

## 2023-09-01 DIAGNOSIS — M1712 Unilateral primary osteoarthritis, left knee: Secondary | ICD-10-CM

## 2023-09-01 DIAGNOSIS — E7800 Pure hypercholesterolemia, unspecified: Secondary | ICD-10-CM

## 2023-09-01 DIAGNOSIS — E1169 Type 2 diabetes mellitus with other specified complication: Secondary | ICD-10-CM

## 2023-09-01 DIAGNOSIS — Z6841 Body Mass Index (BMI) 40.0 and over, adult: Secondary | ICD-10-CM

## 2023-09-01 MED ORDER — NEBIVOLOL HCL 10 MG PO TABS: 10.0000 mg | ORAL_TABLET | Freq: Every day | ORAL | 2 refills | Status: DC

## 2023-09-01 NOTE — Patient Instructions (Signed)

## 2023-09-01 NOTE — Progress Notes (Signed)
 I,Marisa [name], CMA,acting as a neurosurgeon for Marisa LOISE Slocumb, MD.,have documented all relevant documentation on the behalf of Marisa LOISE Slocumb, MD,as directed by  Marisa LOISE Slocumb, MD while in the presence of Marisa LOISE Slocumb, MD.  Subjective:  Patient ID: Marisa [name] , female    DOB: 03-07-1960 , 63 y.o.   MRN: [id number]  Chief Complaint  Patient presents with   Diabetes    Patient presents today for a diabetes and bp check. Patient reports compliance with her meds. Patient denies having chest pain,sob or headaches at this time.    Hypertension    HPI Discussed the use of AI scribe software for clinical note transcription with the patient, who gave verbal consent to proceed.  History of Present Illness Marisa [name] is a 63 year old female with diabetes who presents for a diabetes check.  Her blood sugar levels have been improving, with a recent postprandial reading of 140 mg/dL. She is currently taking all prescribed medications, including those for diabetes and cholesterol.  She faces challenges with weight management, stating she does not eat much and often has to make herself eat. She has eliminated foods like potatoes, rice, and bread from her diet but is not currently exercising due to knee pain. She reports difficulty putting weight on her left knee, which has been diagnosed as arthritis. She has seen a specialist who did not provide a brace or injection, but she was offered meloxicam. She has attempted walking with her husband but finds it difficult due to knee pain.  She is managing high cholesterol and has been taking her cholesterol medication as prescribed. She is awaiting further evaluation of her cholesterol levels.  She is currently taking nebivolol  for blood pressure management and prefers to receive her medications through mail order. She has been managing her blood pressure and cholesterol medications as instructed.  Her social history includes caring  for her husband's elderly mother and brother, which impacts her time availability for exercise.   Diabetes She presents for her follow-up diabetic visit. She has type 2 diabetes mellitus. Her disease course has been stable. There are no hypoglycemic associated symptoms. Pertinent negatives for diabetes include no blurred vision, no polydipsia, no polyphagia and no polyuria. There are no hypoglycemic complications. Risk factors for coronary artery disease include diabetes mellitus, hypertension, sedentary lifestyle, post-menopausal, dyslipidemia and obesity. She is following a diabetic diet. She does not see a podiatrist. Hypertension This is a chronic problem. The current episode started more than 1 year ago. The problem has been gradually improving since onset. The problem is uncontrolled. Pertinent negatives include no blurred vision. Risk factors for coronary artery disease include diabetes mellitus, dyslipidemia, obesity, post-menopausal state and sedentary lifestyle. The current treatment provides moderate improvement. Compliance problems include exercise.      Past Medical History:  Diagnosis Date   Borderline diabetes    Hypertension      Family History  Problem Relation Age of Onset   Diabetes Mother    Hypertension Mother      Current Outpatient Medications:    ACCU-CHEK GUIDE TEST test strip, USE 1 STRIP TO TEST THREE TIMES A DAY TO CHECK BLOOD SUGARS. (KEEP UNUSED STRIPS IN ORIGINAL SEALED CONTAINER BETWEEN USES), Disp: 100 each, Rfl: 2   Accu-Chek Softclix Lancets lancets, USE LANCET TO TEST THREE TIMES A DAY - (DISCARD LANCET IN APPROPRIATE CONTAINER IMMEDIATELY AFTER USE. USE A NEW LANCET FOR EACH TESTING), Disp: 100 each, Rfl: 2  atorvastatin  (LIPITOR) 40 MG tablet, Take one tablet by mouth everyday except sundays, Disp: 90 tablet, Rfl: 1   Blood Glucose Monitoring Suppl (ACCU-CHEK GUIDE ME) w/Device KIT, Use as directed to check blood sugars three times daily E11.65, Disp:  1 kit, Rfl: 1   dapagliflozin  propanediol (FARXIGA ) 10 MG TABS tablet, TAKE ONE TABLET BY MOUTH EVERY DAY BEFORE BREAKFAST, Disp: 90 tablet, Rfl: 2   folic acid  (FOLVITE ) 1 MG tablet, TAKE ONE TABLET BY MOUTH EVERY DAY, Disp: 90 tablet, Rfl: 1   gabapentin  (NEURONTIN ) 100 MG capsule, Take 1 capsule (100 mg total) by mouth daily., Disp: 30 capsule, Rfl: 2   LINZESS  145 MCG CAPS capsule, TAKE ONE CAPSULE BY MOUTH EVERY DAY BEFORE FIRST MEAL OF THE DAY, Disp: 90 capsule, Rfl: 1   metFORMIN  (GLUCOPHAGE ) 1000 MG tablet, Take 1 tablet (1,000 mg total) by mouth in the morning and at bedtime., Disp: 180 tablet, Rfl: 2   Olmesartan -amLODIPine -HCTZ 40-10-25 MG TABS, Take 1 tablet by mouth daily., Disp: 90 tablet, Rfl: 1   Semaglutide , 1 MG/DOSE, (OZEMPIC , 1 MG/DOSE,) 4 MG/3ML SOPN, INJECT 1 MG SUBCUTANEOUSLY ONCE A WEEK - (ADMINISTER BY SUBCUTANEOUS INJECTION INTO THE ABDOMEN, THIGH, OR UPPER ARM AT ANY TIME OF DAY ON THE SAME DAY EACH WEEK) DISCARD PEN 56 DAYS AFTER FIRST USE, Disp: 9 mL, Rfl: 3   Vitamin D , Ergocalciferol , (DRISDOL ) 1.25 MG (50000 UNIT) CAPS capsule, TAKE ONE CAPSULE BY MOUTH EVERY SEVEN DAYS (MAY CONTAIN SOY OR PEANUT--TALK TO YOUR DOCTOR BEFORE TAKING IF YOU ARE ALLERGIC), Disp: 12 capsule, Rfl: 0   nebivolol  (BYSTOLIC ) 10 MG tablet, Take 1 tablet (10 mg total) by mouth daily., Disp: 90 tablet, Rfl: 2   No Known Allergies   Review of Systems  Constitutional: Negative.   Eyes: Negative.  Negative for blurred vision.  Respiratory: Negative.    Cardiovascular: Negative.   Gastrointestinal: Negative.   Endocrine: Negative for polydipsia, polyphagia and polyuria.  Musculoskeletal:  Positive for arthralgias.  Skin: Negative.   Psychiatric/Behavioral: Negative.       Today's Vitals   09/01/23 1156  BP: 130/80  Pulse: 95  Temp: 98.2 F (36.8 C)  TempSrc: Oral  Weight: 246 lb (111.6 kg)  Height: 5' (1.524 m)  PainSc: 0-No pain   Body mass index is 48.04 kg/m.  Wt Readings from  Last 3 Encounters:  09/01/23 246 lb (111.6 kg)  07/22/23 246 lb (111.6 kg)  07/08/23 246 lb (111.6 kg)    The 10-year ASCVD risk score (Arnett DK, et al., 2019) is: 19.4%   Values used to calculate the score:     Age: 30 years     Clincally relevant sex: Female     Is Non-Hispanic African American: Yes     Diabetic: Yes     Tobacco smoker: No     Systolic Blood Pressure: 130 mmHg     Is BP treated: Yes     HDL Cholesterol: 44 mg/dL     Total Cholesterol: 186 mg/dL  Objective:  Physical Exam Vitals and nursing note reviewed.  Constitutional:      Appearance: Normal appearance. She is obese.  HENT:     Head: Normocephalic and atraumatic.  Eyes:     Extraocular Movements: Extraocular movements intact.  Cardiovascular:     Rate and Rhythm: Normal rate and regular rhythm.     Heart sounds: Normal heart sounds.  Pulmonary:     Effort: Pulmonary effort is normal.     Breath sounds:  Normal breath sounds.  Musculoskeletal:     Cervical back: Normal range of motion.  Skin:    General: Skin is warm.  Neurological:     General: No focal deficit present.     Mental Status: She is alert.  Psychiatric:        Mood and Affect: Mood normal.        Behavior: Behavior normal.      Assessment And Plan:  Dyslipidemia associated with type 2 diabetes mellitus (HCC) Assessment & Plan: Current regimen includes Ozempic , metformin , and Farxiga .  Blood sugar levels improving but remain above target with postprandial reading of 140 mg/dL. - Order A1c test. - She will f/u In 3 months for re-evaluation.  - Importance of dietary/medication compliance was discussed with the patient.   Orders: -     Hemoglobin A1c -     BMP8+EGFR  Essential hypertension, benign Assessment & Plan: Chronic, improved control.  She will continue with olmesartan /amlodipine /hydrochlorothiazide   and Bystolic  10mg  daily.   - Advise reducing sodium intake by avoiding foods in bags or boxes, bread, and cheese. -  Encourage exercise, suggesting swimming to avoid joint strain.   Primary osteoarthritis of left knee Assessment & Plan: Chronic left knee pain persists. Discussed aquatic exercises and potential cortisone injection if symptoms persist. Emphasized self-care to prevent hospitalization. - Recommend aquatic exercises such as water aerobics or walking in the pool. - Advise contacting the specialist for a cortisone injection if symptoms do not improve.   Class 3 severe obesity due to excess calories with serious comorbidity and body mass index (BMI) of 45.0 to 49.9 in adult Assessment & Plan: BMI 48. She is encouraged to strive to lose ten percent of her body weight to decrease cardiac risk. Again, encouraged water exercises to decrease risk of aggravating her knee OA.    Other orders -     Nebivolol  HCl; Take 1 tablet (10 mg total) by mouth daily.  Dispense: 90 tablet; Refill: 2   Return in 3 months (on 12/02/2023), or dm check/lipid check.  Patient was given opportunity to ask questions. Patient verbalized understanding of the plan and was able to repeat key elements of the plan. All questions were answered to their satisfaction.    I, Marisa LOISE Slocumb, MD, have reviewed all documentation for this visit. The documentation on 09/01/23 for the exam, diagnosis, procedures, and orders are all accurate and complete.   IF YOU HAVE BEEN REFERRED TO A SPECIALIST, IT MAY TAKE 1-2 WEEKS TO SCHEDULE/PROCESS THE REFERRAL. IF YOU HAVE NOT HEARD FROM US /SPECIALIST IN TWO WEEKS, PLEASE GIVE US  A CALL AT [phone].

## 2023-09-02 ENCOUNTER — Ambulatory Visit: Payer: Self-pay | Admitting: Internal Medicine

## 2023-09-02 LAB — BMP8+EGFR
BUN/Creatinine Ratio: 18 (ref 12–28)
BUN: 14 mg/dL (ref 8–27)
CO2: 26 mmol/L (ref 20–29)
Calcium: 9.2 mg/dL (ref 8.7–10.3)
Chloride: 99 mmol/L (ref 96–106)
Creatinine, Ser: 0.77 mg/dL (ref 0.57–1.00)
Glucose: 119 mg/dL — ABNORMAL HIGH (ref 70–99)
Potassium: 3.6 mmol/L (ref 3.5–5.2)
Sodium: 140 mmol/L (ref 134–144)
eGFR: 87 mL/min/1.73 (ref >59)

## 2023-09-02 LAB — HEMOGLOBIN A1C
Est. average glucose Bld gHb Est-mCnc: 143 mg/dL
Hgb A1c MFr Bld: 6.6 % — ABNORMAL HIGH (ref 4.8–5.6)

## 2023-09-03 DIAGNOSIS — M1712 Unilateral primary osteoarthritis, left knee: Secondary | ICD-10-CM | POA: Insufficient documentation

## 2023-09-03 NOTE — Assessment & Plan Note (Signed)
 BMI 48. She is encouraged to strive to lose ten percent of her body weight to decrease cardiac risk. Again, encouraged water exercises to decrease risk of aggravating her knee OA.

## 2023-09-03 NOTE — Assessment & Plan Note (Signed)
 Chronic, improved control.  She will continue with olmesartan /amlodipine /hydrochlorothiazide   and Bystolic  10mg  daily.   - Advise reducing sodium intake by avoiding foods in bags or boxes, bread, and cheese. - Encourage exercise, suggesting swimming to avoid joint strain.

## 2023-09-03 NOTE — Assessment & Plan Note (Signed)
 Current regimen includes Ozempic , metformin , and Farxiga .  Blood sugar levels improving but remain above target with postprandial reading of 140 mg/dL. - Order A1c test. - She will f/u In 3 months for re-evaluation.  - Importance of dietary/medication compliance was discussed with the patient.

## 2023-09-03 NOTE — Assessment & Plan Note (Signed)
 Previous cholesterol levels were high. She is on prescribed cholesterol medication. Discussed dietary modifications, specifically reducing egg yolk intake and increasing egg whites. Consider increasing medication dosage if cholesterol levels do not improve. - Check cholesterol levels at next visit. - Advise dietary modification to use one egg with additional egg whites.

## 2023-09-03 NOTE — Assessment & Plan Note (Signed)
 Chronic left knee pain persists. Discussed aquatic exercises and potential cortisone injection if symptoms persist. Emphasized self-care to prevent hospitalization. - Recommend aquatic exercises such as water aerobics or walking in the pool. - Advise contacting the specialist for a cortisone injection if symptoms do not improve.

## 2023-09-15 NOTE — Progress Notes (Unsigned)
 error

## 2023-09-16 ENCOUNTER — Encounter: Payer: Self-pay | Admitting: Neurology

## 2023-09-16 ENCOUNTER — Other Ambulatory Visit

## 2023-09-16 ENCOUNTER — Ambulatory Visit: Admitting: Neurology

## 2023-09-16 VITALS — BP 183/96 | HR 88 | Ht 60.0 in | Wt 254.0 lb

## 2023-09-16 DIAGNOSIS — R202 Paresthesia of skin: Secondary | ICD-10-CM | POA: Diagnosis not present

## 2023-09-16 NOTE — Progress Notes (Signed)
 [facility] Clinic Note - Initial Visit   Date: 09/16/2023   Marisa [name] MRN: [id number] DOB: 1960-08-29   Dear Dr. Jarold:  Thank you for your kind referral of Marisa [name] for consultation of left facial numbness. Although her history is well known to you, please allow us  to reiterate it for the purpose of our medical record. The patient was accompanied to the clinic by self.    Marisa [name] is a 63 y.o. right-handed female with diabetes mellitus, hypertension, and hyperlipidemia presenting for evaluation of left facial numbness.   IMPRESSION/PLAN: Left facial numbness, slowly improving.  Initially symptoms involved the left medial face (eyelid, nose, cheek, and upper lip), however, this has resolved and she has slightly numbness over the left lower lip.  Uncertain etiology. CT head was personally reviewed and normal.  Diabetes is well-controlled.   - Check ESR, CRP, vitamin B12, folate, SPEP with IFE, ANA, SSA/B  - MRI brain wwo contrast  Further recommendations pending results.   ------------------------------------------------------------- History of present illness: Several months ago, she developed numbness involving the left side of her face, which involved her medial left eye, check, and lips.  It lasted about two weeks and self-resolved.  She recalls having tenderness over the left upper teeth around the same time.  She saw her dentist who prescribed antibiotics for possible infection.  She has very mild numbness now over the lower lip on the left. No associated numbness involving the left arm or leg, headaches, vision changes, or facial weakness.    Nonsmoker and does not drink alcohol.    Out-side paper records, electronic medical record, and images have been reviewed where available and summarized as:  CT head wo contrast 08/03/2023:  Normal  Lab Results  Component Value Date   HGBA1C 6.6 (H) 09/01/2023   Lab  Results  Component Value Date   TSH 0.734 02/19/2023    Past Medical History:  Diagnosis Date   Borderline diabetes    Hypertension     Past Surgical History:  Procedure Laterality Date   ABDOMINAL HYSTERECTOMY     CESAREAN SECTION     SHOULDER SURGERY       Medications:  Outpatient Encounter Medications as of 09/16/2023  Medication Sig   ACCU-CHEK GUIDE TEST test strip USE 1 STRIP TO TEST THREE TIMES A DAY TO CHECK BLOOD SUGARS. (KEEP UNUSED STRIPS IN ORIGINAL SEALED CONTAINER BETWEEN USES)   Accu-Chek Softclix Lancets lancets USE LANCET TO TEST THREE TIMES A DAY - (DISCARD LANCET IN APPROPRIATE CONTAINER IMMEDIATELY AFTER USE. USE A NEW LANCET FOR EACH TESTING)   atorvastatin  (LIPITOR) 40 MG tablet Take one tablet by mouth everyday except sundays   Blood Glucose Monitoring Suppl (ACCU-CHEK GUIDE ME) w/Device KIT Use as directed to check blood sugars three times daily E11.65   dapagliflozin  propanediol (FARXIGA ) 10 MG TABS tablet TAKE ONE TABLET BY MOUTH EVERY DAY BEFORE BREAKFAST   folic acid  (FOLVITE ) 1 MG tablet TAKE ONE TABLET BY MOUTH EVERY DAY   gabapentin  (NEURONTIN ) 100 MG capsule Take 1 capsule (100 mg total) by mouth daily. (Patient taking differently: Take 100 mg by mouth daily. PRN)   LINZESS  145 MCG CAPS capsule TAKE ONE CAPSULE BY MOUTH EVERY DAY BEFORE FIRST MEAL OF THE DAY   metFORMIN  (GLUCOPHAGE ) 1000 MG tablet Take 1 tablet (1,000 mg total) by mouth in the morning and at bedtime.   nebivolol  (BYSTOLIC ) 10 MG tablet Take 1 tablet (10 mg total) by mouth  daily.   Olmesartan -amLODIPine -HCTZ 40-10-25 MG TABS Take 1 tablet by mouth daily.   Semaglutide , 1 MG/DOSE, (OZEMPIC , 1 MG/DOSE,) 4 MG/3ML SOPN INJECT 1 MG SUBCUTANEOUSLY ONCE A WEEK - (ADMINISTER BY SUBCUTANEOUS INJECTION INTO THE ABDOMEN, THIGH, OR UPPER ARM AT ANY TIME OF DAY ON THE SAME DAY EACH WEEK) DISCARD PEN 56 DAYS AFTER FIRST USE   Vitamin D , Ergocalciferol , (DRISDOL ) 1.25 MG (50000 UNIT) CAPS capsule TAKE  ONE CAPSULE BY MOUTH EVERY SEVEN DAYS (MAY CONTAIN SOY OR PEANUT--TALK TO YOUR DOCTOR BEFORE TAKING IF YOU ARE ALLERGIC)   No facility-administered encounter medications on file as of 09/16/2023.    Allergies: No Known Allergies  Family History: Family History  Problem Relation Age of Onset   Diabetes Mother    Hypertension Mother     Social History: Social History   Tobacco Use   Smoking status: Never   Smokeless tobacco: Never  Vaping Use   Vaping status: Never Used  Substance Use Topics   Alcohol use: No   Drug use: No   Social History   Social History Narrative   Are you right handed or left handed? Right Handed   Are you currently employed ? No    What is your current occupation? None   Do you live at home alone? No    Who lives with you? Husband    What type of home do you live in: 1 story or 2 story? Two story home        Vital Signs:  BP (!) 183/96   Pulse 88   Ht 5' (1.524 m)   Wt 254 lb (115.2 kg)   SpO2 99%   BMI 49.61 kg/m    Neurological Exam: MENTAL STATUS including orientation to time, place, person, recent and remote memory, attention span and concentration, language, and fund of knowledge is normal.  Speech is not dysarthric.  CRANIAL NERVES: II:  No visual field defects.     III-IV-VI: Pupils equal round and reactive to light.  Normal conjugate, extra-ocular eye movements in all directions of gaze.  No nystagmus.  No ptosis.   V:  Normal facial sensation.    VII:  Normal facial symmetry and movements.   VIII:  Normal hearing and vestibular function.   IX-X:  Normal palatal movement.   XI:  Normal shoulder shrug and head rotation.   XII:  Normal tongue strength and range of motion, no deviation or fasciculation.  MOTOR:  Motor strength is 5/5 throughout.  No atrophy, fasciculations or abnormal movements.  No pronator drift.   MSRs:                                           Right        Left brachioradialis 2+  2+  biceps 2+  2+   triceps 2+  2+  patellar 2+  2+  ankle jerk 2+  2+  Hoffman no  no  plantar response down  down   SENSORY:  Normal and symmetric perception of light touch, pinprick, vibration, and temperature.  Romberg's sign absent.   COORDINATION/GAIT: Normal finger-to- nose-finger.  Intact rapid alternating movements bilaterally. Gait narrow based and stable.    Thank you for allowing me to participate in patient's care.  If I can answer any additional questions, I would be pleased to do so.    Sincerely,  Malissa Slay K. Tobie, DO

## 2023-09-20 LAB — PROTEIN ELECTROPHORESIS, SERUM
Albumin ELP: 3.9 g/dL (ref 3.8–4.8)
Alpha 1: 0.2 g/dL (ref 0.2–0.3)
Alpha 2: 0.7 g/dL (ref 0.5–0.9)
Beta 2: 0.5 g/dL (ref 0.2–0.5)
Beta Globulin: 0.4 g/dL (ref 0.4–0.6)
Gamma Globulin: 1.5 g/dL (ref 0.8–1.7)
Total Protein: 7.3 g/dL (ref 6.1–8.1)

## 2023-09-20 LAB — IMMUNOFIXATION ELECTROPHORESIS
IgG (Immunoglobin G), Serum: 1683 mg/dL — ABNORMAL HIGH (ref 600–1540)
IgM, Serum: 52 mg/dL (ref 50–300)
Immunoglobulin A: 343 mg/dL — ABNORMAL HIGH (ref 70–320)

## 2023-09-20 LAB — SJOGREN'S SYNDROME ANTIBODS(SSA + SSB)
SSA (Ro) (ENA) Antibody, IgG: <1 AI
SSB (La) (ENA) Antibody, IgG: <1 AI

## 2023-09-20 LAB — C-REACTIVE PROTEIN: CRP: 12.2 mg/L — ABNORMAL HIGH (ref <8.0)

## 2023-09-20 LAB — B12 AND FOLATE PANEL
Folate: 8.5 ng/mL
Vitamin B-12: 390 pg/mL (ref 200–1100)

## 2023-09-20 LAB — ANA: Anti Nuclear Antibody (ANA): NEGATIVE

## 2023-09-20 LAB — SEDIMENTATION RATE: Sed Rate: 28 mm/h (ref 0–30)

## 2023-09-24 ENCOUNTER — Ambulatory Visit: Payer: Self-pay | Admitting: Neurology

## 2023-09-30 ENCOUNTER — Ambulatory Visit
Admission: RE | Admit: 2023-09-30 | Discharge: 2023-09-30 | Disposition: A | Discharge status: Home / Self Care | Source: Ambulatory Visit | Attending: Neurology

## 2023-09-30 DIAGNOSIS — R202 Paresthesia of skin: Secondary | ICD-10-CM

## 2023-09-30 MED ORDER — GADOPICLENOL 0.5 MMOL/ML IV SOLN
10.0000 mL | Freq: Once | INTRAVENOUS | Status: AC | PRN
  Administered 2023-09-30: 10 mL via INTRAVENOUS

## 2023-10-07 NOTE — Progress Notes (Deleted)
 Patient: Marisa [name] Date of Birth: 03/27/1960  Reason for Visit: Follow up History from: Patient Primary Neurologist: Dohmeier   ASSESSMENT AND PLAN 63 y.o. year old female   OSA on CPAP (split night sleep study Oct 2023, sleep apnea in the mild degree, didn't capture REM sleep which would have likely increased the AHI. Setup May 2024)   HISTORY OF PRESENT ILLNESS: Today 10/07/23   HISTORY  Marisa [name] is a 63 y.o. female patient who is here for revisit 08/21/2022 for  first follow -up on CPAP .  Chief concern according to patient :  Pt is here for initial CPAP. No more  NOCTURIA, no snoring, rested in AM.  Pt states she is doing well with CPAP. States she is sleeping 6-7 hours, states she takes naps at times. The residual sleepiness is not apnea related , it is noted after medication intake.   90% compliance - 6.h 29 m. 6-18 cm water residual AHi is 0.4/h.        Here is the extract of the sleep study:   POLYSOMNOGRAPHY  INTERPRETATION REPORT    STUDY DATE:  11/09/2021       PATIENT NAME:  Marisa [name]         DATE OF BIRTH:  1960/08/17  PATIENT ID:  [id number]    TYPE OF STUDY:  PSG  READING PHYSICIAN: Dedra Gores, MD REFERRED BY: Catheryn Slocumb, MD SCORING TECHNICIAN: Donnice Counts, RPSGT   HISTORY:10-03-2021: Orthopnoea, pre-diabetes, Super obesity- high risk for OSA. Snoring, Nocturia.  Height: 60 in Weight: 263 lbs (BMI 51) Neck Size: 18 in    Based on AASM criteria, there were 54 apneas (53 obstructive; 0 central; 1 mixed), and 44 hypopneas. Apnea index was 9.9/h. Hypopnea index was 8.1/h. The AHI = apnea-hypopnea index was 18.0/h overall. Total time in sleep was 100% supine - with an AHI at 18.0/h supine. There was no REM sleep. There were 0 respiratory effort-related arousals (RERAs).  Continuous snoring was recorded.  Oxyhemoglobin Saturation Nadir during sleep was at 69% from a mean saturation of 92%.  Of the Total Sleep Time  (TST), time in Hypoxemia (<89%) was present for 39.7 minutes, or 12.2% of total sleep time.   REVIEW OF SYSTEMS: Out of a complete 14 system review of symptoms, the patient complains only of the following symptoms, and all other reviewed systems are negative.  See HPI  ALLERGIES: No Known Allergies  HOME MEDICATIONS: Outpatient Medications Prior to Visit  Medication Sig Dispense Refill   ACCU-CHEK GUIDE TEST test strip USE 1 STRIP TO TEST THREE TIMES A DAY TO CHECK BLOOD SUGARS. (KEEP UNUSED STRIPS IN ORIGINAL SEALED CONTAINER BETWEEN USES) 100 each 2   Accu-Chek Softclix Lancets lancets USE LANCET TO TEST THREE TIMES A DAY - (DISCARD LANCET IN APPROPRIATE CONTAINER IMMEDIATELY AFTER USE. USE A NEW LANCET FOR EACH TESTING) 100 each 2   atorvastatin  (LIPITOR) 40 MG tablet Take one tablet by mouth everyday except sundays 90 tablet 1   Blood Glucose Monitoring Suppl (ACCU-CHEK GUIDE ME) w/Device KIT Use as directed to check blood sugars three times daily E11.65 1 kit 1   dapagliflozin  propanediol (FARXIGA ) 10 MG TABS tablet TAKE ONE TABLET BY MOUTH EVERY DAY BEFORE BREAKFAST 90 tablet 2   folic acid  (FOLVITE ) 1 MG tablet TAKE ONE TABLET BY MOUTH EVERY DAY 90 tablet 1   gabapentin  (NEURONTIN ) 100 MG capsule Take 1 capsule (100 mg total) by mouth daily. (Patient taking differently:  Take 100 mg by mouth daily. PRN) 30 capsule 2   LINZESS  145 MCG CAPS capsule TAKE ONE CAPSULE BY MOUTH EVERY DAY BEFORE FIRST MEAL OF THE DAY 90 capsule 1   metFORMIN  (GLUCOPHAGE ) 1000 MG tablet Take 1 tablet (1,000 mg total) by mouth in the morning and at bedtime. 180 tablet 2   nebivolol  (BYSTOLIC ) 10 MG tablet Take 1 tablet (10 mg total) by mouth daily. 90 tablet 2   Olmesartan -amLODIPine -HCTZ 40-10-25 MG TABS Take 1 tablet by mouth daily. 90 tablet 1   Semaglutide , 1 MG/DOSE, (OZEMPIC , 1 MG/DOSE,) 4 MG/3ML SOPN INJECT 1 MG SUBCUTANEOUSLY ONCE A WEEK - (ADMINISTER BY SUBCUTANEOUS INJECTION INTO THE ABDOMEN, THIGH, OR  UPPER ARM AT ANY TIME OF DAY ON THE SAME DAY EACH WEEK) DISCARD PEN 56 DAYS AFTER FIRST USE 9 mL 3   Vitamin D , Ergocalciferol , (DRISDOL ) 1.25 MG (50000 UNIT) CAPS capsule TAKE ONE CAPSULE BY MOUTH EVERY SEVEN DAYS (MAY CONTAIN SOY OR PEANUT--TALK TO YOUR DOCTOR BEFORE TAKING IF YOU ARE ALLERGIC) 12 capsule 0   No facility-administered medications prior to visit.    PAST MEDICAL HISTORY: Past Medical History:  Diagnosis Date   Borderline diabetes    Hypertension     PAST SURGICAL HISTORY: Past Surgical History:  Procedure Laterality Date   ABDOMINAL HYSTERECTOMY     CESAREAN SECTION     SHOULDER SURGERY      FAMILY HISTORY: Family History  Problem Relation Age of Onset   Diabetes Mother    Hypertension Mother     SOCIAL HISTORY: Social History   Socioeconomic History   Marital status: Married    Spouse name: Not on file   Number of children: Not on file   Years of education: Not on file   Highest education level: Not on file  Occupational History   Not on file  Tobacco Use   Smoking status: Never   Smokeless tobacco: Never  Vaping Use   Vaping status: Never Used  Substance and Sexual Activity   Alcohol use: No   Drug use: No   Sexual activity: Not on file  Other Topics Concern   Not on file  Social History Narrative   Are you right handed or left handed? Right Handed   Are you currently employed ? No    What is your current occupation? None   Do you live at home alone? No    Who lives with you? Husband    What type of home do you live in: 1 story or 2 story? Two story home       Social Drivers of Corporate investment banker Strain: Not on file  Food Insecurity: Not on file  Transportation Needs: Not on file  Physical Activity: Not on file  Stress: Not on file  Social Connections: Unknown (06/08/2021)   Received from [facility]   Social Network    Social Network: Not on file  Intimate Partner Violence: Unknown (04/30/2021)   Received from [facility]   HITS    Physically Hurt: Not on file    Insult or Talk Down To: Not on file    Threaten Physical Harm: Not on file    Scream or Curse: Not on file    PHYSICAL EXAM  There were no vitals filed for this visit. There is no height or weight on file to calculate BMI.  Generalized: Well developed, in no acute distress  Neurological examination  Mentation: Alert oriented to time, place,  history taking. Follows all commands speech and language fluent Cranial nerve II-XII: Pupils were equal round reactive to light. Extraocular movements were full, visual field were full on confrontational test. Facial sensation and strength were normal. Uvula tongue midline. Head turning and shoulder shrug  were normal and symmetric. Motor: The motor testing reveals 5 over 5 strength of all 4 extremities. Good symmetric motor tone is noted throughout.  Sensory: Sensory testing is intact to soft touch on all 4 extremities. No evidence of extinction is noted.  Coordination: Cerebellar testing reveals good finger-nose-finger and heel-to-shin bilaterally.  Gait and station: Gait is normal. Tandem gait is normal. Romberg is negative. No drift is seen.  Reflexes: Deep tendon reflexes are symmetric and normal bilaterally.   DIAGNOSTIC DATA (LABS, IMAGING, TESTING) - I reviewed patient records, labs, notes, testing and imaging myself where available.  Lab Results  Component Value Date   WBC 6.5 10/21/2022   HGB 12.9 10/21/2022   HCT 42.0 10/21/2022   MCV 74 (L) 10/21/2022   PLT 315 10/21/2022      Component Value Date/Time   NA 140 09/01/2023 1255   K 3.6 09/01/2023 1255   CL 99 09/01/2023 1255   CO2 26 09/01/2023 1255   GLUCOSE 119 (H) 09/01/2023 1255   GLUCOSE 140 (H) 02/17/2015 0047   BUN 14 09/01/2023 1255   CREATININE 0.77 09/01/2023 1255   CALCIUM  9.2 09/01/2023 1255   PROT 7.3 09/16/2023 0947   PROT 7.7 05/27/2023 1532   ALBUMIN 4.3 05/27/2023 1532   AST 14 05/27/2023 1532   ALT 10  05/27/2023 1532   ALKPHOS 112 05/27/2023 1532   BILITOT 0.5 05/27/2023 1532   GFRNONAA 86 11/01/2019 1509   GFRAA 99 11/01/2019 1509   Lab Results  Component Value Date   CHOL 186 05/27/2023   HDL 44 05/27/2023   LDLCALC 114 (H) 05/27/2023   TRIG 159 (H) 05/27/2023   CHOLHDL 4.2 05/27/2023   Lab Results  Component Value Date   HGBA1C 6.6 (H) 09/01/2023   Lab Results  Component Value Date   VITAMINB12 390 09/16/2023   Lab Results  Component Value Date   TSH 0.734 02/19/2023    Lauraine Born, AGNP-C, DNP 10/07/2023, 9:34 PM [facility] [address], [address] [place], [place] [address] [phone]

## 2023-10-08 ENCOUNTER — Ambulatory Visit: Admitting: Neurology

## 2023-10-08 ENCOUNTER — Encounter: Payer: Self-pay | Admitting: Neurology

## 2023-10-09 ENCOUNTER — Ambulatory Visit (INDEPENDENT_AMBULATORY_CARE_PROVIDER_SITE_OTHER): Admitting: Family

## 2023-10-09 ENCOUNTER — Encounter (HOSPITAL_BASED_OUTPATIENT_CLINIC_OR_DEPARTMENT_OTHER): Payer: Self-pay

## 2023-10-09 ENCOUNTER — Telehealth: Payer: Self-pay

## 2023-10-09 ENCOUNTER — Encounter (HOSPITAL_BASED_OUTPATIENT_CLINIC_OR_DEPARTMENT_OTHER): Payer: Self-pay | Admitting: Family

## 2023-10-09 VITALS — BP 170/100 | HR 71 | Ht 60.0 in | Wt 256.0 lb

## 2023-10-09 DIAGNOSIS — R0989 Other specified symptoms and signs involving the circulatory and respiratory systems: Secondary | ICD-10-CM | POA: Diagnosis not present

## 2023-10-09 DIAGNOSIS — E1169 Type 2 diabetes mellitus with other specified complication: Secondary | ICD-10-CM | POA: Diagnosis not present

## 2023-10-09 DIAGNOSIS — E785 Hyperlipidemia, unspecified: Secondary | ICD-10-CM

## 2023-10-09 DIAGNOSIS — I1A0 Resistant hypertension: Secondary | ICD-10-CM | POA: Diagnosis not present

## 2023-10-09 NOTE — Progress Notes (Signed)
 [facility] Initial Assessment:    Date:  10/09/2023   ID:  Marisa [name], DOB 03/07/1960, MRN [id number]  PCP:  Jarold Medici, MD  Cardiologist:  None  Nephrologist:  Referring MD: Jarold Medici, MD   CC: Hypertension  History of Present Illness:    Marisa [name] is a 63 y.o. female with a hx of HTN, DM2, HLD, obesity, vitamin D  deficiency here to establish care in the [facility].   Prior echo 03/2022 normal LVEF 55-60%, LV normal in size, mild LVH, grade 1 diastolic dysfunction, mild LAE, mild TR, trace MR. Prior coronary calcium  score 06/04/2023 of 0.  Marisa [name] was diagnosed with hypertension more than 10 years ago. Pleasant lady who is retired and enjoys spending time with her grandchildren. It has been difficult to control. Blood pressure checked with arm cuff at home. Readings have been 130s/80s. However when it is higher will have frontal headache. She attributes high BP today to coffee this morning. Also notes BP will escalate with stress. Assists in caretaking for her elderly mother-in-law. Reports home cuff previously checked for accuracy by PCP. she reports tobacco use never. She does not exercise. Previously completed PREP program. She feels Ozempic  is not facilitating weight loss but also notes she only eats one meal per day. Endorses wearing  CPAP regularly.  She is interested in more natural management of her blood pressure and blood sugar.  Previous antihypertensives:   Past Medical History:  Diagnosis Date   Borderline diabetes    Hypertension     Past Surgical History:  Procedure Laterality Date   ABDOMINAL HYSTERECTOMY     CESAREAN SECTION     SHOULDER SURGERY      Current Medications: Current Meds  Medication Sig   ACCU-CHEK GUIDE TEST test strip USE 1 STRIP TO TEST THREE TIMES A DAY TO CHECK BLOOD SUGARS. (KEEP UNUSED STRIPS IN ORIGINAL SEALED CONTAINER BETWEEN USES)   Accu-Chek Softclix  Lancets lancets USE LANCET TO TEST THREE TIMES A DAY - (DISCARD LANCET IN APPROPRIATE CONTAINER IMMEDIATELY AFTER USE. USE A NEW LANCET FOR EACH TESTING)   atorvastatin  (LIPITOR) 40 MG tablet Take one tablet by mouth everyday except sundays   Blood Glucose Monitoring Suppl (ACCU-CHEK GUIDE ME) w/Device KIT Use as directed to check blood sugars three times daily E11.65   dapagliflozin  propanediol (FARXIGA ) 10 MG TABS tablet TAKE ONE TABLET BY MOUTH EVERY DAY BEFORE BREAKFAST   folic acid  (FOLVITE ) 1 MG tablet TAKE ONE TABLET BY MOUTH EVERY DAY   gabapentin  (NEURONTIN ) 100 MG capsule Take 1 capsule (100 mg total) by mouth daily. (Patient taking differently: Take 100 mg by mouth daily. PRN)   LINZESS  145 MCG CAPS capsule TAKE ONE CAPSULE BY MOUTH EVERY DAY BEFORE FIRST MEAL OF THE DAY   metFORMIN  (GLUCOPHAGE ) 1000 MG tablet Take 1 tablet (1,000 mg total) by mouth in the morning and at bedtime.   nebivolol  (BYSTOLIC ) 10 MG tablet Take 1 tablet (10 mg total) by mouth daily.   Olmesartan -amLODIPine -HCTZ 40-10-25 MG TABS Take 1 tablet by mouth daily.   Semaglutide , 1 MG/DOSE, (OZEMPIC , 1 MG/DOSE,) 4 MG/3ML SOPN INJECT 1 MG SUBCUTANEOUSLY ONCE A WEEK - (ADMINISTER BY SUBCUTANEOUS INJECTION INTO THE ABDOMEN, THIGH, OR UPPER ARM AT ANY TIME OF DAY ON THE SAME DAY EACH WEEK) DISCARD PEN 56 DAYS AFTER FIRST USE   Vitamin D , Ergocalciferol , (DRISDOL ) 1.25 MG (50000 UNIT) CAPS capsule TAKE ONE CAPSULE BY MOUTH EVERY SEVEN DAYS (MAY CONTAIN  SOY OR PEANUT--TALK TO YOUR DOCTOR BEFORE TAKING IF YOU ARE ALLERGIC)     Allergies:   Patient has no known allergies.   Social History   Socioeconomic History   Marital status: Married    Spouse name: Not on file   Number of children: Not on file   Years of education: Not on file   Highest education level: Not on file  Occupational History   Not on file  Tobacco Use   Smoking status: Never   Smokeless tobacco: Never  Vaping Use   Vaping status: Never Used   Substance and Sexual Activity   Alcohol use: No   Drug use: No   Sexual activity: Not on file  Other Topics Concern   Not on file  Social History Narrative   Are you right handed or left handed? Right Handed   Are you currently employed ? No    What is your current occupation? None   Do you live at home alone? No    Who lives with you? Husband    What type of home do you live in: 1 story or 2 story? Two story home       Social Drivers of Health   Financial Resource Strain: Low Risk  (10/09/2023)   Overall Financial Resource Strain (CARDIA)    Difficulty of Paying Living Expenses: Not hard at all  Food Insecurity: No Food Insecurity (10/09/2023)   Hunger Vital Sign    Worried About Running Out of Food in the Last Year: Never true    Ran Out of Food in the Last Year: Never true  Transportation Needs: No Transportation Needs (10/09/2023)   PRAPARE - Administrator, Civil Service (Medical): No    Lack of Transportation (Non-Medical): No  Physical Activity: Inactive (10/09/2023)   Exercise Vital Sign    Days of Exercise per Week: 0 days    Minutes of Exercise per Session: 0 min  Stress: Stress Concern Present (10/09/2023)   [employer] of Occupational Health - Occupational Stress Questionnaire    Feeling of Stress: Very much  Social Connections: Socially Integrated (10/09/2023)   Social Connection and Isolation Panel    Frequency of Communication with Friends and Family: More than three times a week    Frequency of Social Gatherings with Friends and Family: More than three times a week    Attends Religious Services: More than 4 times per year    Active Member of [employer] or Organizations: Yes    Attends Engineer, structural: More than 4 times per year    Marital Status: Married     Family History: The patient's family history includes Diabetes in her mother; Hypertension in her mother.  ROS:   Please see the history of present illness.     All other  systems reviewed and are negative.  EKGs/Labs/Other Studies Reviewed:    EKG Interpretation Date/Time:  Thursday October 09 2023 11:20:58 EDT Ventricular Rate:  71 PR Interval:  180 QRS Duration:  84 QT Interval:  414 QTC Calculation: 449 R Axis:   25  Text Interpretation: Normal sinus rhythm  Overall flat T waves  No acute ST/T wave changes Confirmed by Vannie Mora ([id number]) on 10/09/2023 12:03:07 PM    Recent Labs: 10/21/2022: Hemoglobin 12.9; Platelets 315 02/19/2023: TSH 0.734 05/27/2023: ALT 10 09/01/2023: BUN 14; Creatinine, Ser 0.77; Potassium 3.6; Sodium 140   Recent Lipid Panel    Component Value Date/Time   CHOL 186 05/27/2023 1532  TRIG 159 (H) 05/27/2023 1532   HDL 44 05/27/2023 1532   CHOLHDL 4.2 05/27/2023 1532   LDLCALC 114 (H) 05/27/2023 1532    Physical Exam:   VS:  BP (!) 170/100 (BP Location: Left Arm)   Pulse 71   Ht 5' (1.524 m)   Wt 256 lb (116.1 kg)   SpO2 99%   BMI 50.00 kg/m  , BMI Body mass index is 50 kg/m. GENERAL:  Well appearing, overweight HEENT: Pupils equal round and reactive, fundi not visualized, oral mucosa unremarkable NECK:  No jugular venous distention, waveform within normal limits, carotid upstroke brisk and symmetric, L carotid bruit, no thyromegaly LYMPHATICS:  No cervical adenopathy LUNGS:  Clear to auscultation bilaterally HEART:  RRR.  PMI not displaced or sustained,S1 and S2 within normal limits, no S3, no S4, no clicks, no rubs, no murmurs ABD:  Flat, positive bowel sounds normal in frequency in pitch, no bruits, no rebound, no guarding, no midline pulsatile mass, no hepatomegaly, no splenomegaly EXT:  2 plus pulses throughout, no edema, no cyanosis no clubbing SKIN:  No rashes no nodules NEURO:  Cranial nerves II through XII grossly intact, motor grossly intact throughout PSYCH:  Cognitively intact, oriented to person place and time   ASSESSMENT/PLAN:    HTN - BP labile and not at goal <130/80. Attributes elevated  reading in clinic today to coffee intake.  Continue olmesartan -amlodipine -HCTZ 40 - 20 - 25 mg daily. Discussed increasing Nebivolol  to 20mg  daily, she requests to check BP at home for 1 week prior to making changes.  Discussed need to increase physical activity to 150 minutes of moderate intensity activity per week as well as adhere to low-sodium diet for optimal BP control. Encouraged to participate in right start exercise program. She will bring BP cuff to next OV to ensure accuracy. Secondary workup: Plan for plasma catecholamines, metanephrines, renin aldosterone ratio Plan for renal artery duplex to rule out stenosis Plan for carotid duplex, as below. OSA already treated.  Left carotid bruit - noted on exam. plan for carotid duplex.   OSA - CPAP compliance encouraged. Endorses using regularly. Missed her sleep apnea follow up with neurology yesterday due to mix-up of location, plans to reschedule.   DM2 / Morbid obesity - Continue to follow with PCP. Refer to Nutrition & Diabetes Education. Suspect she is under-eating with only 1 meal per day. Reports no significant weight loss on ozempic , likely due to lifestyle choices. Could consider discussion with PCP regarding increasing Ozempic  to 2mg  dose vs trial of Mounjaro.  Screening for Secondary Hypertension:     10/09/2023   12:40 PM  Causes  Renovascular HTN Screened  Sleep Apnea Screened     - Comments treated with CPAP  Thyroid Disease Screened  Hyperaldosteronism Screened  Pheochromocytoma Screened  Coarctation of the Aorta Screened    Relevant Labs/Studies:    Latest Ref Rng & Units 09/01/2023   12:55 PM 05/27/2023    3:32 PM 02/19/2023   12:30 PM  Basic Labs  Sodium 134 - 144 mmol/L 140  141  142   Potassium 3.5 - 5.2 mmol/L 3.6  3.6  4.2   Creatinine 0.57 - 1.00 mg/dL 9.22  9.18  9.26        Latest Ref Rng & Units 02/19/2023   12:30 PM 10/21/2022    4:20 PM  Thyroid   TSH 0.450 - 4.500 uIU/mL 0.734  0.413  10/09/2023   12:11 PM  Renovascular   Renal Artery US  Completed Yes      Disposition:    FU with MD/APP/PharmD in 1-2 months    Medication Adjustments/Labs and Tests Ordered: Current medicines are reviewed at length with the patient today.  Concerns regarding medicines are outlined above.  Orders Placed This Encounter  Procedures   Aldosterone + renin activity w/ ratio   Catecholamines, fractionated, plasma   Metanephrines, plasma   Ambulatory referral to Nutrition and Diabetic Education   EKG 12-Lead   VAS US  RENAL ARTERY DUPLEX   VAS US  CAROTID   No orders of the defined types were placed in this encounter.    Signed, Reche GORMAN Finder, NP  10/09/2023 12:40 PM     [facility]

## 2023-10-09 NOTE — Telephone Encounter (Signed)
-----   Message from Reche GORMAN Finder sent at 10/09/2023 12:41 PM EDT ----- Marisa [name] she went to the wrong neurology office yesterday is why she missed her appt. Gave her your number to call to reschedule.

## 2023-10-09 NOTE — Telephone Encounter (Signed)
 Called and spoke to pt and scheduled appt for next week

## 2023-10-09 NOTE — Patient Instructions (Addendum)
 Medication Instructions:  Continue your current medications   Labwork: Your physician recommends that you return for lab work tomorrow or one day next week: renin-aldosterone ratio, catecholamines, metanephrines   Testing/Procedures: Your physician has requested that you have a renal artery duplex. During this test, an ultrasound is used to evaluate blood flow to the kidneys. Allow one hour for this exam. Do not eat after midnight the day before and avoid carbonated beverages. Take your medications as you usually do.   Your physician has requested that you have a carotid duplex. This test is an ultrasound of the carotid arteries in your neck. It looks at blood flow through these arteries that supply the brain with blood. Allow one hour for this exam. There are no restrictions or special instructions.  Your EKG today looked good!   Follow-Up: Please follow up in 1-2 month in [facility] with Dr. Raford, Reche Finder, NP or Allean Mink PharmD   Please bring blood pressure cuff to your next office visit.    Special Instructions:    Reche GORMAN Finder, NP will reach out in one week via MyChart to check in on blood pressure. If not consistently at goal <130/80, will consider increasing dose of Nebivolol   Our goal is for your blood pressure to be less than 130/80  Tips to Measure your Blood Pressure Correctly  Here's what you can do to ensure a correct reading:  Don't drink a caffeinated beverage or smoke during the 30 minutes before the test.  Sit quietly for five minutes before the test begins.  During the measurement, sit in a chair with your feet on the floor and your arm supported so your elbow is at about heart level.  The inflatable part of the cuff should completely cover at least 80% of your upper arm, and the cuff should be placed on bare skin, not over a shirt.  Don't talk during the measurement.  Have your blood pressure measured twice, with a brief break in between. If  the readings are different by 5 points or more, have it done a third time.  In 2017, new guidelines from the American Heart Association, the [employer] of Cardiology, and nine other health organizations lowered the diagnosis of high blood pressure to 130/80 mm Hg or higher for all adults. The guidelines also redefined the various blood pressure categories to now include normal, elevated, Stage 1 hypertension, Stage 2 hypertension, and hypertensive crisis (see Blood pressure categories).  Blood pressure categories  Blood pressure category SYSTOLIC (upper number)  DIASTOLIC (lower number)  Normal Less than 120 mm Hg and Less than 80 mm Hg  Elevated 120-129 mm Hg and Less than 80 mm Hg  High blood pressure: Stage 1 hypertension 130-139 mm Hg or 80-89 mm Hg  High blood pressure: Stage 2 hypertension 140 mm Hg or higher or 90 mm Hg or higher  Hypertensive crisis (consult your doctor immediately) Higher than 180 mm Hg and/or Higher than 120 mm Hg  Source: American Heart Association and American Stroke Association. For more on getting your blood pressure under control, buy Controlling Your Blood Pressure, a Special Health Report from [employer]  ________________  Reschedule neurology visit to address sleep apnea: [facility] [phone], [address] [place], [place] [address] [phone]

## 2023-10-12 ENCOUNTER — Other Ambulatory Visit

## 2023-10-13 NOTE — Progress Notes (Unsigned)
 Patient: Marisa [name] Date of Birth: [birth year]  Reason for Visit: Follow up History from: Patient Primary Neurologist: Dohmeier  ASSESSMENT AND PLAN 63 y.o. year old female   OSA on CPAP (split night sleep study Oct 2023, sleep apnea in the mild degree, didn't capture REM sleep which would have likely increased the AHI. Setup May 2024)  Excellent benefit from CPAP.  Will continue nightly use minimum 4 hours.  We will continue current settings.  She will continue to replace supplies routinely through DME.  Her blood pressure was elevated today, she will continue to monitor and discuss with cardiology. She is working on weight loss and has joined a gym. Her CPAP is a Network engineer.  She will follow-up in 1 year.  HISTORY OF PRESENT ILLNESS: Today 10/14/23 Here today for CPAP follow up. Loves her CPAP. Wakes up feeling refreshed. No longer snoring. Sleeping through the night. When she doesn't use it, wake up to use the bathroom frequently. Doesn't wear if lightening is forecasted. Uses [facility]. BP was elevated today, yesterday she had something salty, follows cardiology, they are monitoring her BP. She joined a gym. Few nights recently she missed, she took it apart to clean it and didn't put it back together. Overall loves her CPAP and has excellent benefit. 90 day report shows 71% usage, > 4 hours 60%, 6-18 cm water, AHI 0.5, leak 4.7. 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   HISTORY  Marisa [name] is a 63 y.o. female patient who is here for revisit 08/21/2022 for  first follow -up on CPAP .  Chief concern according to patient :  Pt is here for initial CPAP. No more  NOCTURIA, no snoring, rested in AM.  Pt states she is doing well with CPAP. States she is sleeping 6-7 hours, states she takes naps at times. The residual sleepiness is not apnea related , it is noted after medication intake.   90% compliance - 6.h 29 m. 6-18 cm water residual AHi is 0.4/h.       Here is the extract of the sleep study:   POLYSOMNOGRAPHY  INTERPRETATION REPORT    STUDY DATE:  11/09/2021       PATIENT NAME:  Marisa [name]         DATE OF BIRTH:  1960/02/06  PATIENT ID:  [id number]    TYPE OF STUDY:  PSG  READING PHYSICIAN: Dedra Gores, MD REFERRED BY: Catheryn Slocumb, MD SCORING TECHNICIAN: Donnice Counts, RPSGT   HISTORY:10-03-2021: Orthopnoea, pre-diabetes, Super obesity- high risk for OSA. Snoring, Nocturia.  Height: 60 in Weight: 263 lbs (BMI 51) Neck Size: 18 in    Based on AASM criteria, there were 54 apneas (53 obstructive; 0 central; 1 mixed), and 44 hypopneas. Apnea index was 9.9/h. Hypopnea index was 8.1/h. The AHI = apnea-hypopnea index was 18.0/h overall. Total time in sleep was 100% supine - with an AHI at 18.0/h supine. There was no REM sleep. There were 0 respiratory effort-related arousals (RERAs).  Continuous snoring was recorded.  Oxyhemoglobin Saturation Nadir during sleep was at 69% from a mean saturation of 92%.  Of the Total Sleep Time (TST), time in Hypoxemia (<89%) was present for 39.7 minutes, or 12.2% of total sleep time.   REVIEW OF SYSTEMS: Out of a complete 14 system review of symptoms, the patient complains only of the following symptoms, and all other reviewed systems are negative.  See HPI  ALLERGIES: No Known Allergies  HOME MEDICATIONS: Outpatient Medications Prior to Visit  Medication Sig Dispense Refill   ACCU-CHEK GUIDE TEST test strip USE 1 STRIP TO TEST THREE TIMES A DAY TO CHECK BLOOD SUGARS. (KEEP UNUSED STRIPS IN ORIGINAL SEALED CONTAINER BETWEEN USES) 100 each 2   Accu-Chek Softclix Lancets lancets USE LANCET TO TEST THREE TIMES A DAY - (DISCARD LANCET IN APPROPRIATE CONTAINER IMMEDIATELY AFTER USE. USE A NEW LANCET FOR EACH TESTING) 100 each 2   atorvastatin  (LIPITOR) 40 MG tablet Take one tablet by mouth everyday except sundays 90 tablet 1   Blood Glucose Monitoring Suppl (ACCU-CHEK GUIDE ME) w/Device  KIT Use as directed to check blood sugars three times daily E11.65 1 kit 1   dapagliflozin  propanediol (FARXIGA ) 10 MG TABS tablet TAKE ONE TABLET BY MOUTH EVERY DAY BEFORE BREAKFAST 90 tablet 2   folic acid  (FOLVITE ) 1 MG tablet TAKE ONE TABLET BY MOUTH EVERY DAY 90 tablet 1   gabapentin  (NEURONTIN ) 100 MG capsule Take 1 capsule (100 mg total) by mouth daily. (Patient taking differently: Take 100 mg by mouth daily. PRN) 30 capsule 2   LINZESS  145 MCG CAPS capsule TAKE ONE CAPSULE BY MOUTH EVERY DAY BEFORE FIRST MEAL OF THE DAY 90 capsule 1   metFORMIN  (GLUCOPHAGE ) 1000 MG tablet Take 1 tablet (1,000 mg total) by mouth in the morning and at bedtime. 180 tablet 2   nebivolol  (BYSTOLIC ) 10 MG tablet Take 1 tablet (10 mg total) by mouth daily. 90 tablet 2   Olmesartan -amLODIPine -HCTZ 40-10-25 MG TABS Take 1 tablet by mouth daily. 90 tablet 1   Semaglutide , 1 MG/DOSE, (OZEMPIC , 1 MG/DOSE,) 4 MG/3ML SOPN INJECT 1 MG SUBCUTANEOUSLY ONCE A WEEK - (ADMINISTER BY SUBCUTANEOUS INJECTION INTO THE ABDOMEN, THIGH, OR UPPER ARM AT ANY TIME OF DAY ON THE SAME DAY EACH WEEK) DISCARD PEN 56 DAYS AFTER FIRST USE 9 mL 3   Vitamin D , Ergocalciferol , (DRISDOL ) 1.25 MG (50000 UNIT) CAPS capsule TAKE ONE CAPSULE BY MOUTH EVERY SEVEN DAYS (MAY CONTAIN SOY OR PEANUT--TALK TO YOUR DOCTOR BEFORE TAKING IF YOU ARE ALLERGIC) 12 capsule 0   No facility-administered medications prior to visit.    PAST MEDICAL HISTORY: Past Medical History:  Diagnosis Date   Borderline diabetes    Hypertension     PAST SURGICAL HISTORY: Past Surgical History:  Procedure Laterality Date   ABDOMINAL HYSTERECTOMY     CESAREAN SECTION     SHOULDER SURGERY      FAMILY HISTORY: Family History  Problem Relation Age of Onset   Diabetes Mother    Hypertension Mother     SOCIAL HISTORY: Social History   Socioeconomic History   Marital status: Married    Spouse name: Not on file   Number of children: Not on file   Years of  education: Not on file   Highest education level: Not on file  Occupational History   Not on file  Tobacco Use   Smoking status: Never   Smokeless tobacco: Never  Vaping Use   Vaping status: Never Used  Substance and Sexual Activity   Alcohol use: No   Drug use: No   Sexual activity: Not on file  Other Topics Concern   Not on file  Social History Narrative   Are you right handed or left handed? Right Handed   Are you currently employed ? No    What is your current occupation? None   Do you live at home alone? No    Who lives with you? Husband  What type of home do you live in: 1 story or 2 story? Two story home       Social Drivers of Health   Financial Resource Strain: Low Risk  (10/09/2023)   Overall Financial Resource Strain (CARDIA)    Difficulty of Paying Living Expenses: Not hard at all  Food Insecurity: No Food Insecurity (10/09/2023)   Hunger Vital Sign    Worried About Running Out of Food in the Last Year: Never true    Ran Out of Food in the Last Year: Never true  Transportation Needs: No Transportation Needs (10/09/2023)   PRAPARE - Administrator, Civil Service (Medical): No    Lack of Transportation (Non-Medical): No  Physical Activity: Inactive (10/09/2023)   Exercise Vital Sign    Days of Exercise per Week: 0 days    Minutes of Exercise per Session: 0 min  Stress: Stress Concern Present (10/09/2023)   [employer] of Occupational Health - Occupational Stress Questionnaire    Feeling of Stress: Very much  Social Connections: Socially Integrated (10/09/2023)   Social Connection and Isolation Panel    Frequency of Communication with Friends and Family: More than three times a week    Frequency of Social Gatherings with Friends and Family: More than three times a week    Attends Religious Services: More than 4 times per year    Active Member of [employer] or Organizations: Yes    Attends Banker Meetings: More than 4 times per year     Marital Status: Married  Catering manager Violence: Unknown (04/30/2021)   Received from [facility]   HITS    Physically Hurt: Not on file    Insult or Talk Down To: Not on file    Threaten Physical Harm: Not on file    Scream or Curse: Not on file   PHYSICAL EXAM  Vitals:   10/14/23 0810 10/14/23 0813  BP: (!) 175/108 (!) 180/110  Pulse: 92   Weight: 252 lb (114.3 kg)   Height: 5' (1.524 m)    Body mass index is 49.22 kg/m.  Generalized: Well developed, in no acute distress  Neurological examination  Mentation: Alert oriented to time, place, history taking. Follows all commands speech and language fluent Cranial nerve II-XII: Pupils were equal round reactive to light. Extraocular movements were full, visual field were full on confrontational test. Facial sensation and strength were normal. Head turning and shoulder shrug  were normal and symmetric. Motor: The motor testing reveals 5 over 5 strength of all 4 extremities. Good symmetric motor tone is noted throughout.  Sensory: Sensory testing is intact to soft touch on all 4 extremities. No evidence of extinction is noted.  Gait and station: Gait is normal.   DIAGNOSTIC DATA (LABS, IMAGING, TESTING) - I reviewed patient records, labs, notes, testing and imaging myself where available.  Lab Results  Component Value Date   WBC 6.5 10/21/2022   HGB 12.9 10/21/2022   HCT 42.0 10/21/2022   MCV 74 (L) 10/21/2022   PLT 315 10/21/2022      Component Value Date/Time   NA 140 09/01/2023 1255   K 3.6 09/01/2023 1255   CL 99 09/01/2023 1255   CO2 26 09/01/2023 1255   GLUCOSE 119 (H) 09/01/2023 1255   GLUCOSE 140 (H) 02/17/2015 0047   BUN 14 09/01/2023 1255   CREATININE 0.77 09/01/2023 1255   CALCIUM  9.2 09/01/2023 1255   PROT 7.3 09/16/2023 0947   PROT 7.7 05/27/2023 1532  ALBUMIN 4.3 05/27/2023 1532   AST 14 05/27/2023 1532   ALT 10 05/27/2023 1532   ALKPHOS 112 05/27/2023 1532   BILITOT 0.5 05/27/2023 1532   GFRNONAA  86 11/01/2019 1509   GFRAA 99 11/01/2019 1509   Lab Results  Component Value Date   CHOL 186 05/27/2023   HDL 44 05/27/2023   LDLCALC 114 (H) 05/27/2023   TRIG 159 (H) 05/27/2023   CHOLHDL 4.2 05/27/2023   Lab Results  Component Value Date   HGBA1C 6.6 (H) 09/01/2023   Lab Results  Component Value Date   VITAMINB12 390 09/16/2023   Lab Results  Component Value Date   TSH 0.734 02/19/2023   Lauraine Born, AGNP-C, DNP 10/14/2023, 8:25 AM [facility] [address], [address] [place], [place] [address] [phone]

## 2023-10-14 ENCOUNTER — Ambulatory Visit (INDEPENDENT_AMBULATORY_CARE_PROVIDER_SITE_OTHER): Admitting: Neurology

## 2023-10-14 ENCOUNTER — Encounter: Payer: Self-pay | Admitting: Neurology

## 2023-10-14 VITALS — BP 180/110 | HR 92 | Ht 60.0 in | Wt 252.0 lb

## 2023-10-14 DIAGNOSIS — G4733 Obstructive sleep apnea (adult) (pediatric): Secondary | ICD-10-CM | POA: Diagnosis not present

## 2023-10-14 NOTE — Patient Instructions (Signed)
 Great to see you today! Continue CPAP usage minimum 4 hours nightly Continue current settings Continue to replace supplies routinely through DME Follow-up in 1 year or sooner if needed. Thanks!!  Monitor Blood Pressure, keep a log, if continues to be elevated, please discuss with primary care or cardiology.

## 2023-10-14 NOTE — Progress Notes (Signed)
 SABRA

## 2023-10-21 ENCOUNTER — Ambulatory Visit (HOSPITAL_BASED_OUTPATIENT_CLINIC_OR_DEPARTMENT_OTHER): Payer: Self-pay | Admitting: Family

## 2023-10-21 LAB — METANEPHRINES, PLASMA
Metanephrine, Free: <25 pg/mL (ref 0.0–88.0)
Normetanephrine, Free: 98.5 pg/mL (ref 0.0–285.2)

## 2023-10-21 LAB — ALDOSTERONE + RENIN ACTIVITY W/ RATIO
Aldos/Renin Ratio: 20.1 (ref 0.0–30.0)
Aldosterone: 9.6 ng/dL (ref 0.0–30.0)
Renin Activity, Plasma: 0.478 ng/mL/h (ref 0.167–5.380)

## 2023-10-21 LAB — CATECHOLAMINES, FRACTIONATED, PLASMA
Dopamine: 11.7 pg/mL (ref 0.0–36.7)
Epinephrine: <10 pg/mL (ref 0.0–55.4)
Norepinephrine: 386 pg/mL (ref 115–524)

## 2023-10-27 ENCOUNTER — Encounter: Admitting: Internal Medicine

## 2023-11-06 ENCOUNTER — Ambulatory Visit (INDEPENDENT_AMBULATORY_CARE_PROVIDER_SITE_OTHER)

## 2023-11-06 DIAGNOSIS — I10 Essential (primary) hypertension: Secondary | ICD-10-CM

## 2023-11-06 DIAGNOSIS — R0989 Other specified symptoms and signs involving the circulatory and respiratory systems: Secondary | ICD-10-CM

## 2023-11-06 DIAGNOSIS — I1A0 Resistant hypertension: Secondary | ICD-10-CM

## 2023-11-17 ENCOUNTER — Encounter (HOSPITAL_BASED_OUTPATIENT_CLINIC_OR_DEPARTMENT_OTHER): Payer: Self-pay | Admitting: Pharmacist Clinician (PhC)/ Clinical Pharmacy Specialist

## 2023-11-17 ENCOUNTER — Ambulatory Visit (INDEPENDENT_AMBULATORY_CARE_PROVIDER_SITE_OTHER): Admitting: Pharmacist Clinician (PhC)/ Clinical Pharmacy Specialist

## 2023-11-17 VITALS — BP 182/96 | HR 80 | Ht 60.0 in | Wt 253.3 lb

## 2023-11-17 DIAGNOSIS — I1A0 Resistant hypertension: Secondary | ICD-10-CM | POA: Diagnosis not present

## 2023-11-17 NOTE — Patient Instructions (Signed)
 Follow up appointment: Thursday November 20 at 9 am  Take your BP meds as follows:  Continue with your current medications  Check your blood pressure at home daily and keep record of the readings.  Bring your home BP device and all your readings to your next visit.    Your blood pressure goal is < 130/80  To check your pressure at home you will need to:  1. Sit up in a chair, with feet flat on the floor and back supported. Do not cross your ankles or legs. 2. Rest your left arm so that the cuff is about heart level. If the cuff goes on your upper arm,  then just relax the arm on the table, arm of the chair or your lap. If you have a wrist cuff, we  suggest relaxing your wrist against your chest (think of it as Pledging the Flag with the  wrong arm).  3. Place the cuff snugly around your arm, about 1 inch above the crook of your elbow. The  cords should be inside the groove of your elbow.  4. Sit quietly, with the cuff in place, for about 5 minutes. After that 5 minutes press the power  button to start a reading. 5. Do not talk or move while the reading is taking place.  6. Record your readings on a sheet of paper. Although most cuffs have a memory, it is often  easier to see a pattern developing when the numbers are all in front of you.  7. You can repeat the reading after 1-3 minutes if it is recommended  Make sure your bladder is empty and you have not had caffeine or tobacco within the last 30 min  Always bring your blood pressure log with you to your appointments. If you have not brought your monitor in to be double checked for accuracy, please bring it to your next appointment.  You can find a list of quality blood pressure cuffs at [email or web address]  Important lifestyle changes to control high blood pressure  Intervention  Effect on the BP  Lose extra pounds and watch your waistline Weight loss is one of the most effective lifestyle changes for controlling blood pressure. If  you're overweight or obese, losing even a small amount of weight can help reduce blood pressure. Blood pressure might go down by about 1 millimeter of mercury (mm Hg) with each kilogram (about 2.2 pounds) of weight lost.  Exercise regularly As a general goal, aim for at least 30 minutes of moderate physical activity every day. Regular physical activity can lower high blood pressure by about 5 to 8 mm Hg.  Eat a healthy diet Eating a diet rich in whole grains, fruits, vegetables, and low-fat dairy products and low in saturated fat and cholesterol. A healthy diet can lower high blood pressure by up to 11 mm Hg.  Reduce salt (sodium) in your diet Even a small reduction of sodium in the diet can improve heart health and reduce high blood pressure by about 5 to 6 mm Hg.  Limit alcohol One drink equals 12 ounces of beer, 5 ounces of wine, or 1.5 ounces of 80-proof liquor.  Limiting alcohol to less than one drink a day for women or two drinks a day for men can help lower blood pressure by about 4 mm Hg.   If you have any questions or concerns please use My Chart to send questions or call the office at [phone]

## 2023-11-17 NOTE — Progress Notes (Signed)
 Office Visit    Patient Name: Marisa [name] Date of Encounter: 11/17/2023  Primary Care Provider:  Jarold Medici, MD Primary Cardiologist:  None  Chief Complaint   Hypertension  Significant Past Medical History   DM Managed with medication (empagliflozin, metformin , semaglutide )  HLD Managed with medication (atorvastatin )  OSA Managed with CPAP    No Known Allergies  History of Present Illness    Marisa [name] is a 63 y.o. female patient of Dr. Jarold, in the office today for hypertension evaluation.   Blood Pressure Goal:  130/80  Current Medications:  Nebivolol  10 mg daily, Olmesartan -amlodipine -HCTZ 40-10-25 once daily,   Family Hx:   Mother has history of HTN, DM, deceased with stage 4 kidney (possible heart attack) 3 children: age 29, 32, and 40 (all healthy)  Social Hx:      Tobacco: none  Alcohol: none  Caffeine: stopped drinking coffee   Diet: eat once a day around 2-6 pm and drinks water with lemon, beet juice, and lemon ginger tea.   Exercise: water aerobic and strength training here a [employer]   Home BP readings:     BP at home cuff is too small Recommend doing it in the forearm     Accessory Clinical Findings    Lab Results  Component Value Date   CREATININE 0.77 09/01/2023   BUN 14 09/01/2023   NA 140 09/01/2023   K 3.6 09/01/2023   CL 99 09/01/2023   CO2 26 09/01/2023   Lab Results  Component Value Date   ALT 10 05/27/2023   AST 14 05/27/2023   ALKPHOS 112 05/27/2023   BILITOT 0.5 05/27/2023   Lab Results  Component Value Date   HGBA1C 6.6 (H) 09/01/2023    Home Medications    Current Outpatient Medications  Medication Sig Dispense Refill   ACCU-CHEK GUIDE TEST test strip USE 1 STRIP TO TEST THREE TIMES A DAY TO CHECK BLOOD SUGARS. (KEEP UNUSED STRIPS IN ORIGINAL SEALED CONTAINER BETWEEN USES) 100 each 2   Accu-Chek Softclix Lancets lancets USE LANCET TO TEST THREE TIMES A DAY - (DISCARD LANCET IN APPROPRIATE  CONTAINER IMMEDIATELY AFTER USE. USE A NEW LANCET FOR EACH TESTING) 100 each 2   atorvastatin  (LIPITOR) 40 MG tablet Take one tablet by mouth everyday except sundays 90 tablet 1   Blood Glucose Monitoring Suppl (ACCU-CHEK GUIDE ME) w/Device KIT Use as directed to check blood sugars three times daily E11.65 1 kit 1   dapagliflozin  propanediol (FARXIGA ) 10 MG TABS tablet TAKE ONE TABLET BY MOUTH EVERY DAY BEFORE BREAKFAST 90 tablet 2   folic acid  (FOLVITE ) 1 MG tablet TAKE ONE TABLET BY MOUTH EVERY DAY 90 tablet 1   gabapentin  (NEURONTIN ) 100 MG capsule Take 1 capsule (100 mg total) by mouth daily. (Patient taking differently: Take 100 mg by mouth daily. PRN/NUMBNESS IN FACIAL AREA) 30 capsule 2   LINZESS  145 MCG CAPS capsule TAKE ONE CAPSULE BY MOUTH EVERY DAY BEFORE FIRST MEAL OF THE DAY 90 capsule 1   metFORMIN  (GLUCOPHAGE ) 1000 MG tablet Take 1 tablet (1,000 mg total) by mouth in the morning and at bedtime. 180 tablet 2   nebivolol  (BYSTOLIC ) 10 MG tablet Take 1 tablet (10 mg total) by mouth daily. 90 tablet 2   Olmesartan -amLODIPine -HCTZ 40-10-25 MG TABS Take 1 tablet by mouth daily. 90 tablet 1   Semaglutide , 1 MG/DOSE, (OZEMPIC , 1 MG/DOSE,) 4 MG/3ML SOPN INJECT 1 MG SUBCUTANEOUSLY ONCE A WEEK - (ADMINISTER BY SUBCUTANEOUS INJECTION INTO  THE ABDOMEN, THIGH, OR UPPER ARM AT ANY TIME OF DAY ON THE SAME DAY EACH WEEK) DISCARD PEN 56 DAYS AFTER FIRST USE 9 mL 3   Vitamin D , Ergocalciferol , (DRISDOL ) 1.25 MG (50000 UNIT) CAPS capsule TAKE ONE CAPSULE BY MOUTH EVERY SEVEN DAYS (MAY CONTAIN SOY OR PEANUT--TALK TO YOUR DOCTOR BEFORE TAKING IF YOU ARE ALLERGIC) 12 capsule 0   No current facility-administered medications for this visit.     HYPERTENSION CONTROL Vitals:   11/17/23 0941 11/17/23 0950  BP: (!) 180/98 (!) 182/96    The patient's blood pressure is elevated above target today.  In order to address the patient's elevated BP: Blood pressure will be monitored at home to determine if  medication changes need to be made.      Assessment & Plan    Assessment: Diagnose with HTN for over 10 years now and prefers life style modifications. She feels stressed, frustrated, and irritated with family dynamic. Monitors BP at home but having issues with the cuff being too small. Recommended patient to check BP on the forearm at heart level. Clinic BP is 184/98 mmHg, repeat was 180/100 mmHg Discussed the importance of BP log, diet, physical exercise, and weight loss Taking Nebivolol  10 mg daily and Olmesartan -amlodipine -HCTZ 40-10-25 once daily States that Tribenzor is making her sleepy. Reviewed possible option for BP control, increasing nebivolol .  Addressed all question about ozempic  with eating and weight loss. She will be visiting a nutritionist tomorrow.     Plan: No changes made to HTN medications - per patient choice.  Patient will record BP at home twice a day for three days a week, bring in BP log and machine to next visit and will assess then.  Follow up in November    Tou S Vang, PharmD Student [facility]  [address] [place], [place] [address] [phone]  I was with patient and student for entire visit and agree with above assessment and plan.  Marisa [name] PharmD [facility]

## 2023-11-18 ENCOUNTER — Encounter: Payer: Self-pay | Admitting: Dietician

## 2023-11-18 ENCOUNTER — Encounter: Attending: Family | Admitting: Dietician

## 2023-11-18 VITALS — Wt 249.7 lb

## 2023-11-18 DIAGNOSIS — E1169 Type 2 diabetes mellitus with other specified complication: Secondary | ICD-10-CM | POA: Insufficient documentation

## 2023-11-18 NOTE — Progress Notes (Signed)
 Diabetes Self-Management Education  Visit Type: First/Initial  Appt. Start Time: 0900 Appt. End Time: 0940  11/18/2023  [name], identified by name and date of birth, is a 63 y.o. female with a diagnosis of Diabetes: Type 2.   ASSESSMENT  History includes: HTN, type 2 diabetes, HLD Labs noted: 09/01/23: A1c 6.6% Medications include: farxiga , metformin , semaglutide  Supplements: folic acid , vitamin d   Pt reports she feels like she needs to learn how to eat in order to lose weight. Pt states sometimes she does not eat until 2pm. Pt reports she tried intermittent fasting for a while and would only eat between 2-6pm.   Pt states a few months ago she started buying less processed foods (chips, sweets, etc) because she felt that if she had them around she would eat them.  Pt reports about 3 weeks ago she started going to [employer] and doing water aerobics and strength training on Tuesday and Thursday. Pt reports she is enjoying this. Pt reports she feels like she has lost muscle over time.   Pt reports high stress. Pt states her husband just had surgery, and he is the primary caregiver for his 61 year old mother and he has a brother in the nursing home which increases their stress.   Weight 249 lb 11.2 oz (113.3 kg). Body mass index is 48.77 kg/m.   Diabetes Self-Management Education - 11/18/23 0857       Visit Information   Visit Type First/Initial      Initial Visit   Diabetes Type Type 2    Date Diagnosed unsure    Are you currently following a meal plan? No    Are you taking your medications as prescribed? Yes      Health Coping   How would you rate your overall health? Good      Psychosocial Assessment   Patient Belief/Attitude about Diabetes Motivated to manage diabetes    What is the hardest part about your diabetes right now, causing you the most concern, or is the most worrisome to you about your diabetes?   Making healty food and beverage choices     Self-care barriers None    Self-management support Doctor's office    Other persons present Patient    Patient Concerns Nutrition/Meal planning    Special Needs None    Preferred Learning Style No preference indicated    Learning Readiness Ready    How often do you need to have someone help you when you read instructions, pamphlets, or other written materials from your doctor or pharmacy? 1 - Never    What is the last grade level you completed in school? 13      Pre-Education Assessment   Patient understands the diabetes disease and treatment process. Needs Instruction    Patient understands incorporating nutritional management into lifestyle. Needs Instruction    Patient undertands incorporating physical activity into lifestyle. Needs Instruction    Patient understands using medications safely. Needs Instruction    Patient understands monitoring blood glucose, interpreting and using results Needs Instruction    Patient understands prevention, detection, and treatment of acute complications. Needs Instruction    Patient understands prevention, detection, and treatment of chronic complications. Needs Instruction    Patient understands how to develop strategies to address psychosocial issues. Needs Instruction    Patient understands how to develop strategies to promote health/change behavior. Needs Instruction      Complications   Last HgB A1C per patient/outside source 6.6 %  How often do you check your blood sugar? 0 times/day (not testing)    Have you had a dilated eye exam in the past 12 months? Yes    Have you had a dental exam in the past 12 months? Yes    Are you checking your feet? Yes      Dietary Intake   Breakfast 10am: half a sandwich    Snack (morning) none OR nuts    Lunch leftover chipotle    Snack (afternoon) leftover salad    Dinner leftover salad    Snack (evening) occasional cake or cookies    Beverage(s) 4 bottles water, 30 oz coconut water, herbal teas       Activity / Exercise   Activity / Exercise Type Light (walking / raking leaves)    How many days per week do you exercise? 2    How many minutes per day do you exercise? 45    Total minutes per week of exercise 90      Patient Education   Previous Diabetes Education No    Disease Pathophysiology Explored patient's options for treatment of their diabetes;Factors that contribute to the development of diabetes    Healthy Eating Role of diet in the treatment of diabetes and the relationship between the three main macronutrients and blood glucose level;Plate Method;Meal options for control of blood glucose level and chronic complications.;Information on hints to eating out and maintain blood glucose control.    Being Active Role of exercise on diabetes management, blood pressure control and cardiac health.;Helped patient identify appropriate exercises in relation to his/her diabetes, diabetes complications and other health issue.    Medications Reviewed patients medication for diabetes, action, purpose, timing of dose and side effects.    Monitoring Purpose and frequency of SMBG.    Chronic complications Relationship between chronic complications and blood glucose control;Identified and discussed with patient  current chronic complications    Diabetes Stress and Support Identified and addressed patients feelings and concerns about diabetes;Worked with patient to identify barriers to care and solutions;Role of stress on diabetes    Lifestyle and Health Coping Lifestyle issues that need to be addressed for better diabetes care      Individualized Goals (developed by patient)   Nutrition General guidelines for healthy choices and portions discussed    Physical Activity Exercise 3-5 times per week;45 minutes per day    Medications take my medication as prescribed    Monitoring  Test my blood glucose as discussed    Problem Solving Eating Pattern    Reducing Risk examine blood glucose patterns;do foot  checks daily;treat hypoglycemia with 15 grams of carbs if blood glucose less than 70mg /dL    Health Coping Ask for help with psychological, social, or emotional issues      Post-Education Assessment   Patient understands the diabetes disease and treatment process. Comprehends key points    Patient understands incorporating nutritional management into lifestyle. Comprehends key points    Patient undertands incorporating physical activity into lifestyle. Comprehends key points    Patient understands using medications safely. Comphrehends key points    Patient understands monitoring blood glucose, interpreting and using results Comprehends key points    Patient understands prevention, detection, and treatment of acute complications. Comprehends key points    Patient understands prevention, detection, and treatment of chronic complications. Comprehends key points    Patient understands how to develop strategies to address psychosocial issues. Comprehends key points    Patient understands how to develop  strategies to promote health/change behavior. Comprehends key points      Outcomes   Expected Outcomes Demonstrated interest in learning. Expect positive outcomes    Future DMSE 4-6 wks    Program Status Not Completed          Individualized Plan for Diabetes Self-Management Training:   Learning Objective:  Patient will have a greater understanding of diabetes self-management. Patient education plan is to attend individual and/or group sessions per assessed needs and concerns.   Plan:   Patient Instructions  Goals Established by Patient:   Goal 1: eat at least 3 times a day.  When snacking, aim to include a complex carb and protein. (See snack sheet)  At meals, aim to include 1/2 plate non-starchy vegetables, 1/4 plate protein, and 1/4 plate complex carbs. (See Plate Method sheet)  Goal 2: continue with water aerobics/strength training 2x/wk, add 2-3 days of walking each week.    Expected Outcomes:  Demonstrated interest in learning. Expect positive outcomes  Education material provided: ADA - How to Thrive: A Guide for Your Journey with Diabetes, My Plate, and Snack sheet  If problems or questions, patient to contact team via:  Phone  Future DSME appointment: 4-6 wks

## 2023-11-18 NOTE — Patient Instructions (Signed)
 Goals Established by Patient:   Goal 1: eat at least 3 times a day.  When snacking, aim to include a complex carb and protein. (See snack sheet)  At meals, aim to include 1/2 plate non-starchy vegetables, 1/4 plate protein, and 1/4 plate complex carbs. (See Plate Method sheet)  Goal 2: continue with water aerobics/strength training 2x/wk, add 2-3 days of walking each week.

## 2023-11-26 ENCOUNTER — Encounter: Payer: Self-pay | Admitting: Internal Medicine

## 2023-11-26 ENCOUNTER — Ambulatory Visit (INDEPENDENT_AMBULATORY_CARE_PROVIDER_SITE_OTHER): Admitting: Internal Medicine

## 2023-11-26 VITALS — BP 132/80 | HR 77 | Temp 98.3°F | Ht 60.0 in | Wt 247.6 lb

## 2023-11-26 DIAGNOSIS — Z0000 Encounter for general adult medical examination without abnormal findings: Secondary | ICD-10-CM

## 2023-11-26 DIAGNOSIS — E1129 Type 2 diabetes mellitus with other diabetic kidney complication: Secondary | ICD-10-CM

## 2023-11-26 DIAGNOSIS — M1712 Unilateral primary osteoarthritis, left knee: Secondary | ICD-10-CM

## 2023-11-26 DIAGNOSIS — E1169 Type 2 diabetes mellitus with other specified complication: Secondary | ICD-10-CM | POA: Diagnosis not present

## 2023-11-26 DIAGNOSIS — I10 Essential (primary) hypertension: Secondary | ICD-10-CM | POA: Diagnosis not present

## 2023-11-26 DIAGNOSIS — R809 Proteinuria, unspecified: Secondary | ICD-10-CM

## 2023-11-26 DIAGNOSIS — E66813 Obesity, class 3: Secondary | ICD-10-CM

## 2023-11-26 DIAGNOSIS — E785 Hyperlipidemia, unspecified: Secondary | ICD-10-CM

## 2023-11-26 DIAGNOSIS — Z6841 Body Mass Index (BMI) 40.0 and over, adult: Secondary | ICD-10-CM

## 2023-11-26 LAB — POCT URINALYSIS DIP (CLINITEK)
Bilirubin, UA: NEGATIVE
Blood, UA: NEGATIVE
Glucose, UA: NEGATIVE mg/dL
Ketones, POC UA: NEGATIVE mg/dL
Leukocytes, UA: NEGATIVE
Nitrite, UA: NEGATIVE
POC PROTEIN,UA: NEGATIVE
Spec Grav, UA: 1.015 (ref 1.010–1.025)
Urobilinogen, UA: 0.2 U/dL
pH, UA: 6 (ref 5.0–8.0)

## 2023-11-26 MED ORDER — AMLODIPINE BESYLATE 10 MG PO TABS: 10.0000 mg | ORAL_TABLET | Freq: Every day | ORAL | 2 refills | Status: DC

## 2023-11-26 MED ORDER — METFORMIN HCL 1000 MG PO TABS: ORAL_TABLET | ORAL | 2 refills | Status: AC

## 2023-11-26 MED ORDER — OLMESARTAN MEDOXOMIL-HCTZ 40-25 MG PO TABS: 1.0000 | ORAL_TABLET | Freq: Every day | ORAL | 2 refills | Status: DC

## 2023-11-26 NOTE — Assessment & Plan Note (Signed)
 Chronic, diabetic foot exam was performed.  Type 2 diabetes with morning glucose levels in high 180s, indicating suboptimal control. Last A1c was 6.0. Discussed dietary habits and encouraged increased physical activity.  LDL goal is less than 70.  She agrees to rto in four weeks for lab vsiit.  - Continue metformin  twice daily with meals. - Continue Ozempic  1 mg. - Encourage exercise at least 5 days a week, including water aerobics and strength training. - Schedule next diabetes check in February.

## 2023-11-26 NOTE — Assessment & Plan Note (Signed)
 Osteoarthritis of left knee causing pain and limiting mobility. Reports difficulty walking long distances. - Encourage low-impact exercises such as water aerobics and stationary biking.

## 2023-11-26 NOTE — Assessment & Plan Note (Signed)
 Essential hypertension with BP 132/80 mmHg, slightly above target. Discussed medication adjustments due to combination pill unavailability. - Continue Bystolic  10 mg. - Adjust olmesartan , hydrochlorothiazide  and amlodipine  regimen due to combination pill unavailability. - Send prescriptions for blood pressure medications to avoid running out. - Follow low sodium diet.

## 2023-11-26 NOTE — Assessment & Plan Note (Signed)
 BMI 48..  Class 3 obesity. Discussed dietary habits and exercise routine. Encouraged lifestyle modifications for weight management. - Encourage increased physical activity, aiming for at least 5 days a week. - Discuss dietary modifications with a nutritionist in December.

## 2023-11-26 NOTE — Assessment & Plan Note (Signed)
 Pure hypercholesterolemia Hypercholesterolemia with previous LDL 114 mg/dL, above target for diabetes. Discrepancy in atorvastatin  prescription fulfillment noted. - Ensure atorvastatin  is being received and taken as prescribed. - Monitor cholesterol levels during next lab visit.

## 2023-11-26 NOTE — Progress Notes (Signed)
 -I,Marisa [name], CMA,acting as a neurosurgeon for Marisa LOISE Slocumb, MD.,have documented all relevant documentation on the behalf of Marisa LOISE Slocumb, MD,as directed by  Marisa LOISE Slocumb, MD while in the presence of Marisa LOISE Slocumb, MD.  Subjective:  Patient ID: Marisa [name] , female    DOB: 11-30-1960 , 63 y.o.   MRN: [id number]  Chief Complaint  Patient presents with   Annual Exam    She is here today for a full physical examination. She is no longer followed by GYN. She has had a hysterectomy.   She reports compliance with meds. She denies headaches, chest pain and shortness of breath. She has no specific concerns or complaints at this time. She reports the olmesartan -amlodipine -hydrochlorothiazide  40-10-25 company sent a letter in the mail stating the rx cannot be a combination. She has not be sent a rx for each brand.  EKG completed on 10/09/23   Diabetes   Hypertension    HPI Discussed the use of AI scribe software for clinical note transcription with the patient, who gave verbal consent to proceed.  History of Present Illness Marisa [name] is a 63 year old female with diabetes who presents for a physical and diabetes check.  Her blood sugar levels in the morning are consistently around 180 mg/dL. She is working on dietary changes, such as reducing sweets and avoiding sodas and juices, although she still consumes fruit. She checks her blood sugar every other day due to a busy lifestyle.  She is currently taking atorvastatin , Farxiga , folic acid , metformin  twice a day (at breakfast and dinner), Bystolic  10 mg, olmesartan , and Ozempic  1 mg. She no longer takes gabapentin  as she is not experiencing facial numbness. She receives her medications through the [facility] pharmacy, which sends her notifications for refills.  Her cholesterol was noted to be high in April, with an LDL level of 114 mg/dL. She exercises at least twice a week, participating in water aerobics and strength training.  She has a stationary bike at home and is working on increasing her exercise frequency. Knee pain limits her ability to walk long distances, but she is trying to increase her physical activity gradually.  Her bowel movements are not good despite drinking water and eating differently. She consumes fruits like bananas, apples, and grapes, and is open to trying prunes to help with her bowel issues. She and her husband are trying to eat healthier, often making homemade soup.  She has an eye appointment scheduled for November and cataracts were mentioned to her. She also received a letter indicating her breast tissue is mostly fat. Knee pain affects her ability to walk long distances. Bowel movements are not good.   Diabetes She presents for her follow-up diabetic visit. She has type 2 diabetes mellitus. Her disease course has been stable. There are no hypoglycemic associated symptoms. Pertinent negatives for hypoglycemia include no confusion, dizziness or headaches. Pertinent negatives for diabetes include no chest pain, no polydipsia, no polyphagia and no weakness. There are no hypoglycemic complications. Risk factors for coronary artery disease include diabetes mellitus, hypertension, sedentary lifestyle, post-menopausal, dyslipidemia and obesity. She does not see a podiatrist. Hypertension This is a chronic problem. The current episode started more than 1 year ago. The problem has been gradually improving since onset. The problem is uncontrolled. Pertinent negatives include no chest pain, headaches, palpitations or shortness of breath. Compliance problems include exercise.      Past Medical History:  Diagnosis Date   Borderline diabetes  Hypertension      Family History  Problem Relation Age of Onset   Diabetes Mother    Hypertension Mother      Current Outpatient Medications:    ACCU-CHEK GUIDE TEST test strip, USE 1 STRIP TO TEST THREE TIMES A DAY TO CHECK BLOOD SUGARS. (KEEP UNUSED  STRIPS IN ORIGINAL SEALED CONTAINER BETWEEN USES), Disp: 100 each, Rfl: 2   Accu-Chek Softclix Lancets lancets, USE LANCET TO TEST THREE TIMES A DAY - (DISCARD LANCET IN APPROPRIATE CONTAINER IMMEDIATELY AFTER USE. USE A NEW LANCET FOR EACH TESTING), Disp: 100 each, Rfl: 2   amLODipine  (NORVASC ) 10 MG tablet, Take 1 tablet (10 mg total) by mouth daily., Disp: 90 tablet, Rfl: 2   atorvastatin  (LIPITOR) 40 MG tablet, Take one tablet by mouth everyday except sundays, Disp: 90 tablet, Rfl: 1   Blood Glucose Monitoring Suppl (ACCU-CHEK GUIDE ME) w/Device KIT, Use as directed to check blood sugars three times daily E11.65, Disp: 1 kit, Rfl: 1   dapagliflozin  propanediol (FARXIGA ) 10 MG TABS tablet, TAKE ONE TABLET BY MOUTH EVERY DAY BEFORE BREAKFAST, Disp: 90 tablet, Rfl: 2   folic acid  (FOLVITE ) 1 MG tablet, TAKE ONE TABLET BY MOUTH EVERY DAY, Disp: 90 tablet, Rfl: 1   gabapentin  (NEURONTIN ) 100 MG capsule, Take 1 capsule (100 mg total) by mouth daily. (Patient taking differently: Take 100 mg by mouth daily. PRN/NUMBNESS IN FACIAL AREA), Disp: 30 capsule, Rfl: 2   LINZESS  145 MCG CAPS capsule, TAKE ONE CAPSULE BY MOUTH EVERY DAY BEFORE FIRST MEAL OF THE DAY, Disp: 90 capsule, Rfl: 1   nebivolol  (BYSTOLIC ) 10 MG tablet, Take 1 tablet (10 mg total) by mouth daily., Disp: 90 tablet, Rfl: 2   olmesartan -hydrochlorothiazide  (BENICAR  HCT) 40-25 MG tablet, Take 1 tablet by mouth daily., Disp: 90 tablet, Rfl: 2   Semaglutide , 1 MG/DOSE, (OZEMPIC , 1 MG/DOSE,) 4 MG/3ML SOPN, INJECT 1 MG SUBCUTANEOUSLY ONCE A WEEK - (ADMINISTER BY SUBCUTANEOUS INJECTION INTO THE ABDOMEN, THIGH, OR UPPER ARM AT ANY TIME OF DAY ON THE SAME DAY EACH WEEK) DISCARD PEN 56 DAYS AFTER FIRST USE, Disp: 9 mL, Rfl: 3   Vitamin D , Ergocalciferol , (DRISDOL ) 1.25 MG (50000 UNIT) CAPS capsule, TAKE ONE CAPSULE BY MOUTH EVERY SEVEN DAYS (MAY CONTAIN SOY OR PEANUT--TALK TO YOUR DOCTOR BEFORE TAKING IF YOU ARE ALLERGIC), Disp: 12 capsule, Rfl: 0    metFORMIN  (GLUCOPHAGE ) 1000 MG tablet, Take one tab po twice daily, Disp: 180 tablet, Rfl: 2   No Known Allergies   Review of Systems  Constitutional: Negative.   HENT: Negative.    Eyes: Negative.   Respiratory: Negative.  Negative for shortness of breath.   Cardiovascular: Negative.  Negative for chest pain and palpitations.  Gastrointestinal: Negative.   Endocrine: Negative.  Negative for polydipsia and polyphagia.  Genitourinary: Negative.   Musculoskeletal: Negative.   Skin: Negative.   Allergic/Immunologic: Negative.   Neurological: Negative.  Negative for dizziness, weakness and headaches.  Hematological: Negative.   Psychiatric/Behavioral: Negative.  Negative for confusion.      Today's Vitals   11/26/23 1155  BP: 132/80  Pulse: 77  Temp: 98.3 F (36.8 C)  SpO2: 98%  Weight: 247 lb 9.6 oz (112.3 kg)  Height: 5' (1.524 m)   Body mass index is 48.36 kg/m.  Wt Readings from Last 3 Encounters:  11/26/23 247 lb 9.6 oz (112.3 kg)  11/18/23 249 lb 11.2 oz (113.3 kg)  11/17/23 253 lb 4.8 oz (114.9 kg)     Objective:  Physical Exam Vitals and nursing note reviewed. Exam conducted with a chaperone present.  Constitutional:      Appearance: Normal appearance. She is obese.  HENT:     Head: Normocephalic and atraumatic.     Right Ear: Tympanic membrane, ear canal and external ear normal.     Left Ear: Tympanic membrane, ear canal and external ear normal.     Nose: Nose normal.     Mouth/Throat:     Mouth: Mucous membranes are moist.     Pharynx: Oropharynx is clear.  Eyes:     Extraocular Movements: Extraocular movements intact.     Conjunctiva/sclera: Conjunctivae normal.     Pupils: Pupils are equal, round, and reactive to light.  Cardiovascular:     Rate and Rhythm: Normal rate and regular rhythm.     Pulses: Normal pulses.          Dorsalis pedis pulses are 2+ on the right side and 2+ on the left side.     Heart sounds: Normal heart sounds.  Pulmonary:      Effort: Pulmonary effort is normal.     Breath sounds: Normal breath sounds.  Chest:  Breasts:    Tanner Score is 5.     Right: Normal.     Left: Normal.  Abdominal:     General: Bowel sounds are normal.     Palpations: Abdomen is soft.     Comments: Obese, soft. Difficult to assess organomegaly.   Genitourinary:    General: Normal vulva.     Exam position: Lithotomy position.     Tanner stage (genital): 5.     Labia:        Right: No rash.        Left: No rash.      Vagina: Normal.     Cervix: Normal.     Uterus: Absent.      Rectum: Normal. Guaiac result negative.     Comments: deferred Musculoskeletal:        General: Normal range of motion.     Cervical back: Normal range of motion and neck supple.  Feet:     Right foot:     Protective Sensation: 5 sites tested.  5 sites sensed.     Skin integrity: Dry skin present.     Toenail Condition: Right toenails are normal.     Left foot:     Protective Sensation: 5 sites tested.  5 sites sensed.     Skin integrity: Dry skin present.     Toenail Condition: Left toenails are normal.  Lymphadenopathy:     Lower Body: No right inguinal adenopathy. No left inguinal adenopathy.  Skin:    General: Skin is warm and dry.  Neurological:     General: No focal deficit present.     Mental Status: She is alert and oriented to person, place, and time.  Psychiatric:        Mood and Affect: Mood normal.        Behavior: Behavior normal.         Assessment And Plan:  Encounter for general adult medical examination w/o abnormal findings Assessment & Plan: A full exam was performed.  Importance of monthly self breast exams was discussed with the patient.  She is advised to get 30-45 minutes of regular exercise, no less than four to five days per week. Both weight-bearing and aerobic exercises are recommended.  She is advised to follow a healthy diet with at least six  fruits/veggies per day, decrease intake of red meat and other saturated  fats and to increase fish intake to twice weekly.  Meats/fish should not be fried -- baked, boiled or broiled is preferable. It is also important to cut back on your sugar intake.  Be sure to read labels - try to avoid anything with added sugar, high fructose corn syrup or other sweeteners.  If you must use a sweetener, you can try stevia or monkfruit.  It is also important to avoid artificially sweetened foods/beverages and diet drinks. Lastly, wear SPF 50 sunscreen on exposed skin and when in direct sunlight for an extended period of time.  Be sure to avoid fast food restaurants and aim for at least 60 ounces of water daily.       Dyslipidemia associated with type 2 diabetes mellitus (HCC) Assessment & Plan: Chronic, diabetic foot exam was performed.  Type 2 diabetes with morning glucose levels in high 180s, indicating suboptimal control. Last A1c was 6.0. Discussed dietary habits and encouraged increased physical activity.  LDL goal is less than 70.  She agrees to rto in four weeks for lab vsiit.  - Continue metformin  twice daily with meals. - Continue Ozempic  1 mg. - Encourage exercise at least 5 days a week, including water aerobics and strength training. - Schedule next diabetes check in February.  Orders: -     CBC; Future -     CMP14+EGFR; Future -     Lipid panel; Future -     POCT URINALYSIS DIP (CLINITEK) -     Microalbumin / creatinine urine ratio -     Hemoglobin A1c; Future  Type 2 diabetes mellitus with microalbuminuria, without long-term current use of insulin (HCC) Assessment & Plan: Chronic, diabetic foot exam was performed.  Type 2 diabetes with morning glucose levels in high 180s, indicating suboptimal control. Last A1c was 6.0. Discussed dietary habits and encouraged increased physical activity.  LDL goal is less than 70.  She agrees to rto in four weeks for lab vsiit.  - Continue metformin  twice daily with meals. - Continue Ozempic  1 mg. - Encourage exercise at least 5  days a week, including water aerobics and strength training. - Schedule next diabetes check in February.  Orders: -     metFORMIN  HCl; Take one tab po twice daily  Dispense: 180 tablet; Refill: 2  Essential hypertension, benign Assessment & Plan: Essential hypertension with BP 132/80 mmHg, slightly above target. Discussed medication adjustments due to combination pill unavailability. - Continue Bystolic  10 mg. - Adjust olmesartan , hydrochlorothiazide  and amlodipine  regimen due to combination pill unavailability. - Send prescriptions for blood pressure medications to avoid running out. - Follow low sodium diet.   Orders: -     CMP14+EGFR; Future -     Lipid panel; Future -     POCT URINALYSIS DIP (CLINITEK) -     Microalbumin / creatinine urine ratio  Primary osteoarthritis of left knee Assessment & Plan: Osteoarthritis of left knee causing pain and limiting mobility. Reports difficulty walking long distances. - Encourage low-impact exercises such as water aerobics and stationary biking.   Class 3 severe obesity due to excess calories with serious comorbidity and body mass index (BMI) of 45.0 to 49.9 in adult [facility]) Assessment & Plan: BMI 48..  Class 3 obesity. Discussed dietary habits and exercise routine. Encouraged lifestyle modifications for weight management. - Encourage increased physical activity, aiming for at least 5 days a week. - Discuss dietary modifications with a nutritionist  in December.   Other orders -     Olmesartan  Medoxomil-HCTZ; Take 1 tablet by mouth daily.  Dispense: 90 tablet; Refill: 2 -     amLODIPine  Besylate; Take 1 tablet (10 mg total) by mouth daily.  Dispense: 90 tablet; Refill: 2   Return in 1 month (on 12/30/2023), or NV - bp check1 and flu shot/lab visit, for 1 YEAR HM, 4 MONTH DM F/U.SABRA  Patient was given opportunity to ask questions. Patient verbalized understanding of the plan and was able to repeat key elements of the plan. All questions were  answered to their satisfaction.   I, Marisa LOISE Slocumb, MD, have reviewed all documentation for this visit. The documentation on 11/26/23 for the exam, diagnosis, procedures, and orders are all accurate and complete.   IF YOU HAVE BEEN REFERRED TO A SPECIALIST, IT MAY TAKE 1-2 WEEKS TO SCHEDULE/PROCESS THE REFERRAL. IF YOU HAVE NOT HEARD FROM US /SPECIALIST IN TWO WEEKS, PLEASE GIVE US  A CALL AT [phone].   THE PATIENT IS ENCOURAGED TO PRACTICE SOCIAL DISTANCING DUE TO THE COVID-19 PANDEMIC.

## 2023-11-26 NOTE — Patient Instructions (Signed)

## 2023-11-26 NOTE — Assessment & Plan Note (Signed)

## 2023-11-27 ENCOUNTER — Ambulatory Visit: Payer: Self-pay | Admitting: Internal Medicine

## 2023-11-28 LAB — MICROALBUMIN / CREATININE URINE RATIO
Creatinine, Urine: 120.4 mg/dL
Microalb/Creat Ratio: 33 mg/g{creat} — ABNORMAL HIGH (ref 0–29)
Microalbumin, Urine: 39.8 ug/mL

## 2023-12-02 ENCOUNTER — Ambulatory Visit: Admitting: Internal Medicine

## 2023-12-17 NOTE — Progress Notes (Signed)
 Office Visit    Patient Name: Marisa [name] Date of Encounter: 12/18/2023  Primary Care Provider:  Jarold Medici, MD Primary Cardiologist:  None  Chief Complaint   Hypertension  Significant Past Medical History   DM2 8/25 A1c 6.6  - on empagliflozin, metformin , Ozempic   HLD 4/25 LDL 114 - on atorvastatin   OSA Managed with CPAP    No Known Allergies  History of Present Illness    Marisa [name] is a 63 y.o. female patient of Dr. Jarold, in the office today for hypertension evaluation.   I last saw her a month ago, at which time her BP was 182/96 in the office.  She was on below listed medications and not interested in any changes.  We discussed measuring home blood pressure on forearm, as cuff too small for upper arm.   Since then she was seen by Medici Jarold MD, and had an office BP reading of 132/80.    Patient returns today for follow up.  She recently received a letter telling her that the Tribenzor would no longer be covered, Dr. Jarold wrote prescriptions for each of the three medications individually.  She has lost about 8 pounds recently and this is making her feel much better and more motivated to continue.    Blood Pressure Goal:  130/80  Current Medications:  Nebivolol  10 mg daily, Olmesartan -amlodipine -HCTZ 40-10-25 once daily,   Family Hx:   Mother has history of HTN, DM, deceased with stage 4 kidney (possible heart attack) 3 children: age 6, 56, and 62 (all healthy)  Social Hx:      Tobacco: none  Alcohol: none  Caffeine: stopped drinking coffee  Diet: now eating breakfast - often overnight oats and adding flax; more nuts and seeds to increase protein  Exercise: water aerobic and strength training here a [employer] - is trying to get walking in daily  Home BP readings: still elevated at home, but have improved since last visit  Accessory Clinical Findings    Lab Results  Component Value Date   CREATININE 0.77 09/01/2023   BUN 14  09/01/2023   NA 140 09/01/2023   K 3.6 09/01/2023   CL 99 09/01/2023   CO2 26 09/01/2023   Lab Results  Component Value Date   ALT 10 05/27/2023   AST 14 05/27/2023   ALKPHOS 112 05/27/2023   BILITOT 0.5 05/27/2023   Lab Results  Component Value Date   HGBA1C 6.6 (H) 09/01/2023    Home Medications    Current Outpatient Medications  Medication Sig Dispense Refill   ACCU-CHEK GUIDE TEST test strip USE 1 STRIP TO TEST THREE TIMES A DAY TO CHECK BLOOD SUGARS. (KEEP UNUSED STRIPS IN ORIGINAL SEALED CONTAINER BETWEEN USES) 100 each 2   Accu-Chek Softclix Lancets lancets USE LANCET TO TEST THREE TIMES A DAY - (DISCARD LANCET IN APPROPRIATE CONTAINER IMMEDIATELY AFTER USE. USE A NEW LANCET FOR EACH TESTING) 100 each 2   amLODipine  (NORVASC ) 10 MG tablet Take 1 tablet (10 mg total) by mouth daily. 90 tablet 2   atorvastatin  (LIPITOR) 40 MG tablet Take one tablet by mouth everyday except sundays 90 tablet 1   Blood Glucose Monitoring Suppl (ACCU-CHEK GUIDE ME) w/Device KIT Use as directed to check blood sugars three times daily E11.65 1 kit 1   chlorthalidone (HYGROTON) 25 MG tablet Take 1 tablet (25 mg total) by mouth daily. 90 tablet 3   dapagliflozin  propanediol (FARXIGA ) 10 MG TABS tablet TAKE ONE TABLET BY MOUTH  EVERY DAY BEFORE BREAKFAST 90 tablet 2   folic acid  (FOLVITE ) 1 MG tablet TAKE ONE TABLET BY MOUTH EVERY DAY 90 tablet 1   LINZESS  145 MCG CAPS capsule TAKE ONE CAPSULE BY MOUTH EVERY DAY BEFORE FIRST MEAL OF THE DAY 90 capsule 1   metFORMIN  (GLUCOPHAGE ) 1000 MG tablet Take one tab po twice daily 180 tablet 2   nebivolol  (BYSTOLIC ) 10 MG tablet Take 1 tablet (10 mg total) by mouth daily. 90 tablet 2   olmesartan  (BENICAR ) 40 MG tablet Take 1 tablet (40 mg total) by mouth daily. 90 tablet 3   Semaglutide , 1 MG/DOSE, (OZEMPIC , 1 MG/DOSE,) 4 MG/3ML SOPN INJECT 1 MG SUBCUTANEOUSLY ONCE A WEEK - (ADMINISTER BY SUBCUTANEOUS INJECTION INTO THE ABDOMEN, THIGH, OR UPPER ARM AT ANY TIME OF  DAY ON THE SAME DAY EACH WEEK) DISCARD PEN 56 DAYS AFTER FIRST USE 9 mL 3   Vitamin D , Ergocalciferol , (DRISDOL ) 1.25 MG (50000 UNIT) CAPS capsule TAKE ONE CAPSULE BY MOUTH EVERY SEVEN DAYS (MAY CONTAIN SOY OR PEANUT--TALK TO YOUR DOCTOR BEFORE TAKING IF YOU ARE ALLERGIC) 12 capsule 0   gabapentin  (NEURONTIN ) 100 MG capsule Take 1 capsule (100 mg total) by mouth daily. (Patient not taking: Reported on 12/18/2023) 30 capsule 2   No current facility-administered medications for this visit.     HYPERTENSION CONTROL Vitals:   12/18/23 0910 12/18/23 0934  BP: (!) 142/92 (!) 140/90    The patient's blood pressure is elevated above target today.  In order to address the patient's elevated BP:       Assessment & Plan    Assessment: BP is uncontrolled in office BP 140/90 mmHg;  above the goal (<130/80). Her plan ([employer]) will no longer cover Tribenzor,  PCP divided out and sent month supply of all 3 meds at last visit Tolerates olmesartan , amlodipine , hctz and nebivolol  well, without any side effects Denies SOB, palpitation, chest pain, headaches,or swelling Reiterated the importance of regular exercise and low salt diet   Plan:  Stop taking hydrochlorothiazide  Start taking chlorthalidone 25 mg once daily Continue taking olmesartan , nebivolol  and amlodipine  Patient to keep record of BP readings with heart rate and report to us  at the next visit Patient to follow up with me in 2 months  Labs ordered today:  BMET in 2 weeks  Allean Mink PharmD [facility]

## 2023-12-18 ENCOUNTER — Encounter (HOSPITAL_BASED_OUTPATIENT_CLINIC_OR_DEPARTMENT_OTHER): Payer: Self-pay | Admitting: Pharmacist Clinician (PhC)/ Clinical Pharmacy Specialist

## 2023-12-18 ENCOUNTER — Ambulatory Visit (INDEPENDENT_AMBULATORY_CARE_PROVIDER_SITE_OTHER): Admitting: Pharmacist Clinician (PhC)/ Clinical Pharmacy Specialist

## 2023-12-18 VITALS — BP 140/90 | Ht 60.0 in | Wt 245.0 lb

## 2023-12-18 DIAGNOSIS — I10 Essential (primary) hypertension: Secondary | ICD-10-CM

## 2023-12-18 MED ORDER — OLMESARTAN MEDOXOMIL 40 MG PO TABS: 40.0000 mg | ORAL_TABLET | Freq: Every day | ORAL | 3 refills | Status: DC

## 2023-12-18 MED ORDER — CHLORTHALIDONE 25 MG PO TABS: 25.0000 mg | ORAL_TABLET | Freq: Every day | ORAL | 3 refills | Status: DC

## 2023-12-18 NOTE — Patient Instructions (Signed)
 Follow up appointment: Thursday January 29 at 10 am  Go to the lab in 2-3 weeks to check kidney function  Take your BP meds as follows:    Stop hydrochlorothiazide  25 mg  Start chlorthalidone 25 mg once daily continue with olmesartan  40 mg, amlodipine  10 mg and nebivolol  10 mg all once daily.    Check your blood pressure at home 3-4 days per week (in January!) and keep record of the readings.  Your blood pressure goal is < 130/80  To check your pressure at home you will need to:  1. Sit up in a chair, with feet flat on the floor and back supported. Do not cross your ankles or legs. 2. Rest your left arm so that the cuff is about heart level. If the cuff goes on your upper arm,  then just relax the arm on the table, arm of the chair or your lap. If you have a wrist cuff, we  suggest relaxing your wrist against your chest (think of it as Pledging the Flag with the  wrong arm).  3. Place the cuff snugly around your arm, about 1 inch above the crook of your elbow. The  cords should be inside the groove of your elbow.  4. Sit quietly, with the cuff in place, for about 5 minutes. After that 5 minutes press the power  button to start a reading. 5. Do not talk or move while the reading is taking place.  6. Record your readings on a sheet of paper. Although most cuffs have a memory, it is often  easier to see a pattern developing when the numbers are all in front of you.  7. You can repeat the reading after 1-3 minutes if it is recommended  Make sure your bladder is empty and you have not had caffeine or tobacco within the last 30 min  Always bring your blood pressure log with you to your appointments. If you have not brought your monitor in to be double checked for accuracy, please bring it to your next appointment.  You can find a list of quality blood pressure cuffs at [email or web address]  Important lifestyle changes to control high blood pressure  Intervention  Effect on the BP  Lose  extra pounds and watch your waistline Weight loss is one of the most effective lifestyle changes for controlling blood pressure. If you're overweight or obese, losing even a small amount of weight can help reduce blood pressure. Blood pressure might go down by about 1 millimeter of mercury (mm Hg) with each kilogram (about 2.2 pounds) of weight lost.  Exercise regularly As a general goal, aim for at least 30 minutes of moderate physical activity every day. Regular physical activity can lower high blood pressure by about 5 to 8 mm Hg.  Eat a healthy diet Eating a diet rich in whole grains, fruits, vegetables, and low-fat dairy products and low in saturated fat and cholesterol. A healthy diet can lower high blood pressure by up to 11 mm Hg.  Reduce salt (sodium) in your diet Even a small reduction of sodium in the diet can improve heart health and reduce high blood pressure by about 5 to 6 mm Hg.  Limit alcohol One drink equals 12 ounces of beer, 5 ounces of wine, or 1.5 ounces of 80-proof liquor.  Limiting alcohol to less than one drink a day for women or two drinks a day for men can help lower blood pressure by about 4 mm Hg.  If you have any questions or concerns please use My Chart to send questions or call the office at [phone]

## 2023-12-18 NOTE — Assessment & Plan Note (Signed)
 Assessment: BP is uncontrolled in office BP 140/90 mmHg;  above the goal (<130/80). Her plan ([employer]) will no longer cover Tribenzor,  PCP divided out and sent month supply of all 3 meds at last visit Tolerates olmesartan , amlodipine , hctz and nebivolol  well, without any side effects Denies SOB, palpitation, chest pain, headaches,or swelling Reiterated the importance of regular exercise and low salt diet   Plan:  Stop taking hydrochlorothiazide  Start taking chlorthalidone 25 mg once daily Continue taking olmesartan , nebivolol  and amlodipine  Patient to keep record of BP readings with heart rate and report to us  at the next visit Patient to follow up with me in 2 months  Labs ordered today:  BMET in 2 weeks

## 2023-12-30 ENCOUNTER — Ambulatory Visit

## 2023-12-30 VITALS — BP 144/78 | HR 80 | Temp 98.1°F | Ht 60.0 in | Wt 245.0 lb

## 2023-12-30 DIAGNOSIS — Z23 Encounter for immunization: Secondary | ICD-10-CM

## 2023-12-30 DIAGNOSIS — E1169 Type 2 diabetes mellitus with other specified complication: Secondary | ICD-10-CM

## 2023-12-30 DIAGNOSIS — I10 Essential (primary) hypertension: Secondary | ICD-10-CM

## 2023-12-30 NOTE — Patient Instructions (Signed)
 Hypertension, Adult Hypertension is another name for high blood pressure. High blood pressure forces your heart to work harder to pump blood. This can cause problems over time. There are two numbers in a blood pressure reading. There is a top number (systolic) over a bottom number (diastolic). It is best to have a blood pressure that is below 120/80. What are the causes? The cause of this condition is not known. Some other conditions can lead to high blood pressure. What increases the risk? Some lifestyle factors can make you more likely to develop high blood pressure: Smoking. Not getting enough exercise or physical activity. Being overweight. Having too much fat, sugar, calories, or salt (sodium) in your diet. Drinking too much alcohol. Other risk factors include: Having any of these conditions: Heart disease. Diabetes. High cholesterol. Kidney disease. Obstructive sleep apnea. Having a family history of high blood pressure and high cholesterol. Age. The risk increases with age. Stress. What are the signs or symptoms? High blood pressure may not cause symptoms. Very high blood pressure (hypertensive crisis) may cause: Headache. Fast or uneven heartbeats (palpitations). Shortness of breath. Nosebleed. Vomiting or feeling like you may vomit (nauseous). Changes in how you see. Very bad chest pain. Feeling dizzy. Seizures. How is this treated? This condition is treated by making healthy lifestyle changes, such as: Eating healthy foods. Exercising more. Drinking less alcohol. Your doctor may prescribe medicine if lifestyle changes do not help enough and if: Your top number is above 130. Your bottom number is above 80. Your personal target blood pressure may vary. Follow these instructions at home: Eating and drinking  If told, follow the DASH eating plan. To follow this plan: Fill one half of your plate at each meal with fruits and vegetables. Fill one fourth of your plate  at each meal with whole grains. Whole grains include whole-wheat pasta, brown rice, and whole-grain bread. Eat or drink low-fat dairy products, such as skim milk or low-fat yogurt. Fill one fourth of your plate at each meal with low-fat (lean) proteins. Low-fat proteins include fish, chicken without skin, eggs, beans, and tofu. Avoid fatty meat, cured and processed meat, or chicken with skin. Avoid pre-made or processed food. Limit the amount of salt in your diet to less than 1,500 mg each day. Do not drink alcohol if: Your doctor tells you not to drink. You are pregnant, may be pregnant, or are planning to become pregnant. If you drink alcohol: Limit how much you have to: 0-1 drink a day for women. 0-2 drinks a day for men. Know how much alcohol is in your drink. In the [place] one drink equals one 12 oz bottle of beer (355 mL), one 5 oz glass of wine (148 mL), or one 1 oz glass of hard liquor (44 mL). Lifestyle  Work with your doctor to stay at a healthy weight or to lose weight. Ask your doctor what the best weight is for you. Get at least 30 minutes of exercise that causes your heart to beat faster (aerobic exercise) most days of the week. This may include walking, swimming, or biking. Get at least 30 minutes of exercise that strengthens your muscles (resistance exercise) at least 3 days a week. This may include lifting weights or doing Pilates. Do not smoke or use any products that contain nicotine or tobacco. If you need help quitting, ask your doctor. Check your blood pressure at home as told by your doctor. Keep all follow-up visits. Medicines Take over-the-counter and prescription medicines  only as told by your doctor. Follow directions carefully. Do not skip doses of blood pressure medicine. The medicine does not work as well if you skip doses. Skipping doses also puts you at risk for problems. Ask your doctor about side effects or reactions to medicines that you should watch  for. Contact a doctor if: You think you are having a reaction to the medicine you are taking. You have headaches that keep coming back. You feel dizzy. You have swelling in your ankles. You have trouble with your vision. Get help right away if: You get a very bad headache. You start to feel mixed up (confused). You feel weak or numb. You feel faint. You have very bad pain in your: Chest. Belly (abdomen). You vomit more than once. You have trouble breathing. These symptoms may be an emergency. Get help right away. Call 911. Do not wait to see if the symptoms will go away. Do not drive yourself to the hospital. Summary Hypertension is another name for high blood pressure. High blood pressure forces your heart to work harder to pump blood. For most people, a normal blood pressure is less than 120/80. Making healthy choices can help lower blood pressure. If your blood pressure does not get lower with healthy choices, you may need to take medicine. This information is not intended to replace advice given to you by your health care provider. Make sure you discuss any questions you have with your health care provider. Document Revised: 11/02/2020 Document Reviewed: 11/02/2020 Elsevier Patient Education  2024 [employer].

## 2023-12-30 NOTE — Progress Notes (Signed)
 Patient presents today for a bpc. Patient reports compliance with her meds. Patient reports she is taking the amlodipine  10mg , olmesartan  40mg  and bystolic  10mg  in the mornings and  she takes the chlorthalidone  25mg  in the afternoons. I checked her blood pressure and it was 160/80 P76. I had patient wait 10 minutes and it was 144/78 P80. After speaking with provider patient was advised to take the chlorthalidone  25mg  in the mornings and amlodipine  10mg  with dinner and keep the rest of her meds the same. Patient was advised to come back for a 2 week NV bpc. She was also advised if her bp is still up at that time then a another medication may need to be added. Patient verbalized understanding. YL,RMA     Patient also received a flu vaccine in her left deltoid. Patient tolerated injection well.  Patient also had her labs drawn from 11/26/23.   BP Readings from Last 3 Encounters:  12/18/23 (!) 140/90  11/26/23 132/80  11/17/23 (!) 182/96

## 2023-12-31 ENCOUNTER — Encounter: Admitting: Dietician

## 2023-12-31 LAB — CMP14+EGFR
ALT: 13 IU/L (ref 0–32)
AST: 15 IU/L (ref 0–40)
Albumin: 4.1 g/dL (ref 3.9–4.9)
Alkaline Phosphatase: 99 IU/L (ref 49–135)
BUN/Creatinine Ratio: 17 (ref 12–28)
BUN: 13 mg/dL (ref 8–27)
Bilirubin Total: 0.4 mg/dL (ref 0.0–1.2)
CO2: 26 mmol/L (ref 20–29)
Calcium: 9.5 mg/dL (ref 8.7–10.3)
Chloride: 97 mmol/L (ref 96–106)
Creatinine, Ser: 0.78 mg/dL (ref 0.57–1.00)
Globulin, Total: 3.4 g/dL (ref 1.5–4.5)
Glucose: 118 mg/dL — ABNORMAL HIGH (ref 70–99)
Potassium: 3.3 mmol/L — ABNORMAL LOW (ref 3.5–5.2)
Sodium: 138 mmol/L (ref 134–144)
Total Protein: 7.5 g/dL (ref 6.0–8.5)
eGFR: 85 mL/min/1.73 (ref >59)

## 2023-12-31 LAB — LIPID PANEL
Chol/HDL Ratio: 3.6 ratio (ref 0.0–4.4)
Cholesterol, Total: 167 mg/dL (ref 100–199)
HDL: 47 mg/dL (ref >39)
LDL Chol Calc (NIH): 93 mg/dL (ref 0–99)
Triglycerides: 158 mg/dL — ABNORMAL HIGH (ref 0–149)
VLDL Cholesterol Cal: 27 mg/dL (ref 5–40)

## 2023-12-31 LAB — CBC
Hematocrit: 44.9 % (ref 34.0–46.6)
Hemoglobin: 13.5 g/dL (ref 11.1–15.9)
MCH: 22 pg — ABNORMAL LOW (ref 26.6–33.0)
MCHC: 30.1 g/dL — ABNORMAL LOW (ref 31.5–35.7)
MCV: 73 fL — ABNORMAL LOW (ref 79–97)
Platelets: 304 x10E3/uL (ref 150–450)
RBC: 6.13 x10E6/uL — ABNORMAL HIGH (ref 3.77–5.28)
RDW: 17.3 % — ABNORMAL HIGH (ref 11.7–15.4)
WBC: 7 x10E3/uL (ref 3.4–10.8)

## 2023-12-31 LAB — HEMOGLOBIN A1C
Est. average glucose Bld gHb Est-mCnc: 160 mg/dL
Hgb A1c MFr Bld: 7.2 % — ABNORMAL HIGH (ref 4.8–5.6)

## 2024-01-13 ENCOUNTER — Ambulatory Visit

## 2024-01-13 VITALS — BP 142/90 | HR 80 | Temp 98.3°F | Ht 60.0 in | Wt 245.0 lb

## 2024-01-13 DIAGNOSIS — I10 Essential (primary) hypertension: Secondary | ICD-10-CM

## 2024-01-13 NOTE — Progress Notes (Signed)
 Patient is in office today for a nurse visit for Blood Pressure Check. She currently takes Chlorthalidone  25MG  in the morning & amlodipine  10mg  at dinner. Along with bystolic  10mg  & olmesartan  40mg  in the mornings. Denies headache, chest pain & sob. She reports going through a lot currently with a recent loss of her mother in law , husband being in & out of the ER & being sleep deprived.  BP Readings from Last 3 Encounters:  01/13/24 (!) 142/90  12/30/23 (!) 144/78  12/18/23 (!) 140/90  She reports also being in an hurry she has to take her husband to the doctor. Per provider patient will follow up at 03/31/24 appointment. She is to continue to takes medications as prescribed. We will follow up at next visit.

## 2024-01-13 NOTE — Patient Instructions (Signed)
 Hypertension, Adult Hypertension is another name for high blood pressure. High blood pressure forces your heart to work harder to pump blood. This can cause problems over time. There are two numbers in a blood pressure reading. There is a top number (systolic) over a bottom number (diastolic). It is best to have a blood pressure that is below 120/80. What are the causes? The cause of this condition is not known. Some other conditions can lead to high blood pressure. What increases the risk? Some lifestyle factors can make you more likely to develop high blood pressure: Smoking. Not getting enough exercise or physical activity. Being overweight. Having too much fat, sugar, calories, or salt (sodium) in your diet. Drinking too much alcohol. Other risk factors include: Having any of these conditions: Heart disease. Diabetes. High cholesterol. Kidney disease. Obstructive sleep apnea. Having a family history of high blood pressure and high cholesterol. Age. The risk increases with age. Stress. What are the signs or symptoms? High blood pressure may not cause symptoms. Very high blood pressure (hypertensive crisis) may cause: Headache. Fast or uneven heartbeats (palpitations). Shortness of breath. Nosebleed. Vomiting or feeling like you may vomit (nauseous). Changes in how you see. Very bad chest pain. Feeling dizzy. Seizures. How is this treated? This condition is treated by making healthy lifestyle changes, such as: Eating healthy foods. Exercising more. Drinking less alcohol. Your doctor may prescribe medicine if lifestyle changes do not help enough and if: Your top number is above 130. Your bottom number is above 80. Your personal target blood pressure may vary. Follow these instructions at home: Eating and drinking  If told, follow the DASH eating plan. To follow this plan: Fill one half of your plate at each meal with fruits and vegetables. Fill one fourth of your plate  at each meal with whole grains. Whole grains include whole-wheat pasta, brown rice, and whole-grain bread. Eat or drink low-fat dairy products, such as skim milk or low-fat yogurt. Fill one fourth of your plate at each meal with low-fat (lean) proteins. Low-fat proteins include fish, chicken without skin, eggs, beans, and tofu. Avoid fatty meat, cured and processed meat, or chicken with skin. Avoid pre-made or processed food. Limit the amount of salt in your diet to less than 1,500 mg each day. Do not drink alcohol if: Your doctor tells you not to drink. You are pregnant, may be pregnant, or are planning to become pregnant. If you drink alcohol: Limit how much you have to: 0-1 drink a day for women. 0-2 drinks a day for men. Know how much alcohol is in your drink. In the [place] one drink equals one 12 oz bottle of beer (355 mL), one 5 oz glass of wine (148 mL), or one 1 oz glass of hard liquor (44 mL). Lifestyle  Work with your doctor to stay at a healthy weight or to lose weight. Ask your doctor what the best weight is for you. Get at least 30 minutes of exercise that causes your heart to beat faster (aerobic exercise) most days of the week. This may include walking, swimming, or biking. Get at least 30 minutes of exercise that strengthens your muscles (resistance exercise) at least 3 days a week. This may include lifting weights or doing Pilates. Do not smoke or use any products that contain nicotine or tobacco. If you need help quitting, ask your doctor. Check your blood pressure at home as told by your doctor. Keep all follow-up visits. Medicines Take over-the-counter and prescription medicines  only as told by your doctor. Follow directions carefully. Do not skip doses of blood pressure medicine. The medicine does not work as well if you skip doses. Skipping doses also puts you at risk for problems. Ask your doctor about side effects or reactions to medicines that you should watch  for. Contact a doctor if: You think you are having a reaction to the medicine you are taking. You have headaches that keep coming back. You feel dizzy. You have swelling in your ankles. You have trouble with your vision. Get help right away if: You get a very bad headache. You start to feel mixed up (confused). You feel weak or numb. You feel faint. You have very bad pain in your: Chest. Belly (abdomen). You vomit more than once. You have trouble breathing. These symptoms may be an emergency. Get help right away. Call 911. Do not wait to see if the symptoms will go away. Do not drive yourself to the hospital. Summary Hypertension is another name for high blood pressure. High blood pressure forces your heart to work harder to pump blood. For most people, a normal blood pressure is less than 120/80. Making healthy choices can help lower blood pressure. If your blood pressure does not get lower with healthy choices, you may need to take medicine. This information is not intended to replace advice given to you by your health care provider. Make sure you discuss any questions you have with your health care provider. Document Revised: 11/02/2020 Document Reviewed: 11/02/2020 Elsevier Patient Education  2024 [employer].

## 2024-01-28 ENCOUNTER — Encounter (HOSPITAL_BASED_OUTPATIENT_CLINIC_OR_DEPARTMENT_OTHER): Payer: Self-pay | Admitting: Emergency Medicine

## 2024-01-28 ENCOUNTER — Inpatient Hospital Stay (HOSPITAL_BASED_OUTPATIENT_CLINIC_OR_DEPARTMENT_OTHER)
Admission: EM | Admit: 2024-01-28 | Discharge: 2024-02-05 | DRG: 064 | Disposition: A | Discharge status: Rehabilitation Facility | Attending: Internal Medicine | Admitting: Internal Medicine

## 2024-01-28 ENCOUNTER — Other Ambulatory Visit: Payer: Self-pay

## 2024-01-28 ENCOUNTER — Emergency Department (HOSPITAL_BASED_OUTPATIENT_CLINIC_OR_DEPARTMENT_OTHER)

## 2024-01-28 DIAGNOSIS — Z7984 Long term (current) use of oral hypoglycemic drugs: Secondary | ICD-10-CM

## 2024-01-28 DIAGNOSIS — I10 Essential (primary) hypertension: Secondary | ICD-10-CM | POA: Diagnosis not present

## 2024-01-28 DIAGNOSIS — Z833 Family history of diabetes mellitus: Secondary | ICD-10-CM

## 2024-01-28 DIAGNOSIS — Z7982 Long term (current) use of aspirin: Secondary | ICD-10-CM

## 2024-01-28 DIAGNOSIS — I6389 Other cerebral infarction: Secondary | ICD-10-CM | POA: Diagnosis not present

## 2024-01-28 DIAGNOSIS — Z8249 Family history of ischemic heart disease and other diseases of the circulatory system: Secondary | ICD-10-CM

## 2024-01-28 DIAGNOSIS — R29810 Facial weakness: Secondary | ICD-10-CM | POA: Diagnosis present

## 2024-01-28 DIAGNOSIS — R531 Weakness: Secondary | ICD-10-CM | POA: Diagnosis present

## 2024-01-28 DIAGNOSIS — R471 Dysarthria and anarthria: Secondary | ICD-10-CM | POA: Diagnosis present

## 2024-01-28 DIAGNOSIS — Z6841 Body Mass Index (BMI) 40.0 and over, adult: Secondary | ICD-10-CM | POA: Diagnosis not present

## 2024-01-28 DIAGNOSIS — Z7902 Long term (current) use of antithrombotics/antiplatelets: Secondary | ICD-10-CM | POA: Diagnosis not present

## 2024-01-28 DIAGNOSIS — I071 Rheumatic tricuspid insufficiency: Secondary | ICD-10-CM

## 2024-01-28 DIAGNOSIS — Z7985 Long-term (current) use of injectable non-insulin antidiabetic drugs: Secondary | ICD-10-CM

## 2024-01-28 DIAGNOSIS — G4733 Obstructive sleep apnea (adult) (pediatric): Secondary | ICD-10-CM | POA: Diagnosis present

## 2024-01-28 DIAGNOSIS — R29711 NIHSS score 11: Secondary | ICD-10-CM | POA: Diagnosis not present

## 2024-01-28 DIAGNOSIS — Z823 Family history of stroke: Secondary | ICD-10-CM | POA: Diagnosis not present

## 2024-01-28 DIAGNOSIS — I639 Cerebral infarction, unspecified: Secondary | ICD-10-CM

## 2024-01-28 DIAGNOSIS — E7800 Pure hypercholesterolemia, unspecified: Secondary | ICD-10-CM | POA: Diagnosis present

## 2024-01-28 DIAGNOSIS — Z79899 Other long term (current) drug therapy: Secondary | ICD-10-CM

## 2024-01-28 DIAGNOSIS — E1165 Type 2 diabetes mellitus with hyperglycemia: Secondary | ICD-10-CM | POA: Diagnosis not present

## 2024-01-28 DIAGNOSIS — E041 Nontoxic single thyroid nodule: Secondary | ICD-10-CM | POA: Diagnosis present

## 2024-01-28 DIAGNOSIS — R87610 Atypical squamous cells of undetermined significance on cytologic smear of cervix (ASC-US): Secondary | ICD-10-CM

## 2024-01-28 DIAGNOSIS — R2990 Unspecified symptoms and signs involving the nervous system: Secondary | ICD-10-CM | POA: Diagnosis present

## 2024-01-28 DIAGNOSIS — E66813 Obesity, class 3: Secondary | ICD-10-CM | POA: Diagnosis present

## 2024-01-28 DIAGNOSIS — G936 Cerebral edema: Secondary | ICD-10-CM | POA: Diagnosis present

## 2024-01-28 DIAGNOSIS — G8191 Hemiplegia, unspecified affecting right dominant side: Secondary | ICD-10-CM | POA: Diagnosis present

## 2024-01-28 LAB — DIFFERENTIAL
Abs Immature Granulocytes: 0.02 K/uL (ref 0.00–0.07)
Basophils Absolute: 0 K/uL (ref 0.0–0.1)
Basophils Relative: 0 %
Eosinophils Absolute: 0.1 K/uL (ref 0.0–0.5)
Eosinophils Relative: 1 %
Immature Granulocytes: 0 %
Lymphocytes Relative: 51 %
Lymphs Abs: 4.4 K/uL — ABNORMAL HIGH (ref 0.7–4.0)
Monocytes Absolute: 0.5 K/uL (ref 0.1–1.0)
Monocytes Relative: 6 %
Neutro Abs: 3.6 K/uL (ref 1.7–7.7)
Neutrophils Relative %: 42 %

## 2024-01-28 LAB — CBG MONITORING, ED: Glucose-Capillary: 141 mg/dL — ABNORMAL HIGH (ref 70–99)

## 2024-01-28 LAB — CBC
HCT: 40.2 % (ref 36.0–46.0)
Hemoglobin: 13 g/dL (ref 12.0–15.0)
MCH: 22.2 pg — ABNORMAL LOW (ref 26.0–34.0)
MCHC: 32.3 g/dL (ref 30.0–36.0)
MCV: 68.6 fL — ABNORMAL LOW (ref 80.0–100.0)
Platelets: 273 K/uL (ref 150–400)
RBC: 5.86 MIL/uL — ABNORMAL HIGH (ref 3.87–5.11)
RDW: 18.1 % — ABNORMAL HIGH (ref 11.5–15.5)
WBC: 8.6 K/uL (ref 4.0–10.5)
nRBC: 0 % (ref 0.0–0.2)

## 2024-01-28 LAB — COMPREHENSIVE METABOLIC PANEL WITH GFR
ALT: 10 U/L (ref 0–44)
AST: 17 U/L (ref 15–41)
Albumin: 4.2 g/dL (ref 3.5–5.0)
Alkaline Phosphatase: 95 U/L (ref 38–126)
Anion gap: 14 (ref 5–15)
BUN: 12 mg/dL (ref 8–23)
CO2: 27 mmol/L (ref 22–32)
Calcium: 9.3 mg/dL (ref 8.9–10.3)
Chloride: 100 mmol/L (ref 98–111)
Creatinine, Ser: 0.79 mg/dL (ref 0.44–1.00)
GFR, Estimated: >60 mL/min
Glucose, Bld: 132 mg/dL — ABNORMAL HIGH (ref 70–99)
Potassium: 3.1 mmol/L — ABNORMAL LOW (ref 3.5–5.1)
Sodium: 140 mmol/L (ref 135–145)
Total Bilirubin: 0.5 mg/dL (ref 0.0–1.2)
Total Protein: 7.8 g/dL (ref 6.5–8.1)

## 2024-01-28 LAB — APTT: aPTT: 25 s (ref 24–36)

## 2024-01-28 LAB — PROTIME-INR
INR: 1 (ref 0.8–1.2)
Prothrombin Time: 13.5 s (ref 11.4–15.2)

## 2024-01-28 MED ORDER — IOHEXOL 350 MG/ML SOLN
75.0000 mL | Freq: Once | INTRAVENOUS | Status: AC | PRN
  Administered 2024-01-28: 75 mL via INTRAVENOUS

## 2024-01-28 MED ORDER — CLOPIDOGREL BISULFATE 300 MG PO TABS
300.0000 mg | ORAL_TABLET | Freq: Once | ORAL | Status: AC
  Administered 2024-01-29: 300 mg via ORAL
  Filled 2024-01-28: qty 1

## 2024-01-28 NOTE — ED Notes (Signed)
 EDP at bedside

## 2024-01-28 NOTE — ED Triage Notes (Signed)
 Pt's husband sts pt has seemed off today; sts she was dragging her feet around 1900; pt reports weakness to RT arm and leg since 1830; husband noticed slurred speech at 2200; Code Stroke activated at 2245

## 2024-01-28 NOTE — ED Provider Notes (Signed)
 " [facility] Provider Note   CSN: [id number] Arrival date & time: 01/28/24  2232  An emergency department physician performed an initial assessment on this suspected stroke patient at 2244.  History Chief Complaint  Patient presents with   Weakness    HPI Marisa [name] is a 63 y.o. female presenting for chief complaint of altered mental status/weakness. Family reports that starting around 5 PM, patient had right upper extremity right lower extremity weakness.  Around 7 PM so developing word finding difficulty/aphasia and then developed a facial droop in the evening tonight. Activated as a code stroke by prior team on patient's arrival to the emergency room. By time of my evaluation, patient had already been seen emergently by neurology and now states her symptoms are currently improving though she is still feels mildly weaker on the right side than left side.    Patient's recorded medical, surgical, social, medication list and allergies were reviewed in the Snapshot window as part of the initial history.   Review of Systems   Review of Systems  Constitutional:  Positive for fatigue. Negative for chills and fever.  HENT:  Negative for ear pain and sore throat.   Eyes:  Negative for pain and visual disturbance.  Respiratory:  Negative for cough and shortness of breath.   Cardiovascular:  Negative for chest pain and palpitations.  Gastrointestinal:  Negative for abdominal pain and vomiting.  Genitourinary:  Negative for dysuria and hematuria.  Musculoskeletal:  Negative for arthralgias and back pain.  Skin:  Negative for color change and rash.  Neurological:  Positive for facial asymmetry and weakness. Negative for seizures and syncope.  Psychiatric/Behavioral:  Positive for confusion.   All other systems reviewed and are negative.   Physical Exam Updated Vital Signs BP (!) 164/85   Pulse 73   Resp (!) 24   Ht 5' (1.524 m)    Wt 111.1 kg   SpO2 96%   BMI 47.85 kg/m  Physical Exam Constitutional:      General: She is not in acute distress.    Appearance: She is not ill-appearing or toxic-appearing.  HENT:     Head: Normocephalic and atraumatic.  Eyes:     Extraocular Movements: Extraocular movements intact.     Pupils: Pupils are equal, round, and reactive to light.  Cardiovascular:     Rate and Rhythm: Normal rate.  Pulmonary:     Effort: No respiratory distress.  Abdominal:     General: Abdomen is flat.  Musculoskeletal:        General: No swelling, deformity or signs of injury.     Cervical back: Normal range of motion. No rigidity.  Skin:    General: Skin is warm and dry.  Neurological:     Mental Status: She is alert and oriented to person, place, and time.     Comments: Agree with exam per neurology.  Psychiatric:        Mood and Affect: Mood normal.      ED Course/ Medical Decision Making/ A&P    Procedures .Critical Care  Performed by: Jerral Meth, MD Authorized by: Jerral Meth, MD   Critical care provider statement:    Critical care time (minutes):  30   Critical care was necessary to treat or prevent imminent or life-threatening deterioration of the following conditions:  CNS failure or compromise   Critical care was time spent personally by me on the following activities:  Development of treatment  plan with patient or surrogate, discussions with consultants, evaluation of patient's response to treatment, examination of patient, ordering and review of laboratory studies, ordering and review of radiographic studies, ordering and performing treatments and interventions, pulse oximetry, re-evaluation of patient's condition and review of old charts   Care discussed with: admitting provider      Medications Ordered in ED Medications  aspirin EC tablet 81 mg (has no administration in time range)  clopidogrel (PLAVIX) tablet 75 mg (has no administration in time range)   hydrALAZINE (APRESOLINE) injection 10 mg (has no administration in time range)  potassium chloride  10 mEq in 100 mL IVPB (has no administration in time range)  iohexol (OMNIPAQUE) 350 MG/ML injection 75 mL (75 mLs Intravenous Contrast Given 01/28/24 2322)  clopidogrel (PLAVIX) tablet 300 mg (300 mg Oral Given 01/29/24 0042)    Medical Decision Making:  Medical Decision Making:    [name] is a 63 y.o. female who presented to the ED today with right sided weakness and aphasia.  Due to these symptoms, nursing activated a CODE STROKE per hospital protocol.   Additional history discussed with patient's family/caregivers.  Patient placed on continuous vitals and telemetry monitoring while in ED which was reviewed periodically.   On my initial exam, the pt was in no acute distress, glucose was WNL and deficits include RS W.  Deficits are  persistent.   Reviewed and confirmed nursing documentation for past medical history, family history, social history.   Initial Assessment and Plan:   Patient immediately evaluated jointly by teleneurology and prior team emergency department providers.   This is most consistent with an acute life/limb threatening illness complicated by underlying chronic conditions.  Patient evaluated per code stroke protocol with immediate cross-sectional imaging of the head via CT head to evaluate for intracranial hemorrhage.  This was augmented with CT angiography imaging of the brain for further evaluation of large vessel occlusion.   Per neurology, these rapid studies revealed no acute pathology. Neurology feels that patient's presentation is equivocal given these findings.  Differential includes metabolic encephalopathy, medication encephalopathy, infectious encephalopathy. Neurology has recommended initiation of DAPT and an MRI for further differentiation of patient's syndrome and evaluation for any ischemic disease while completing a metabolic/infectious workup  with laboratory evaluation per EMR.   Initial Study Results: Labs Labs reviewed without evidence of clinically relevant abnormality. Exceptions include:low K  EKG EKG was reviewed independently. Rate, rhythm, axis, intervals all examined and without medically relevant abnormality. ST segments without concerns for elevations.    Radiology  Images reviewed independently, agree with radiology report at this time.   CT ANGIO HEAD NECK W WO CM (CODE STROKE)  Final Result    CT HEAD CODE STROKE WO CONTRAST (LKW 0-4.5h, LVO 0-24h)  Final Result    MR BRAIN WO CONTRAST    (Results Pending)      Final Assessment and Plan:   Discussed with hospitalist. They have agreed with need for admission per recommendations from neurology. Disposition:   Based on the above findings, I believe this patient is stable for admission.    Patient/family educated about specific findings on our evaluation and explained exact reasons for admission.  Patient/family educated about clinical situation and time was allowed to answer questions.   Admission team communicated with and agreed with need for admission. Patient admitted. Patient  ready to move at this time.     Emergency Department Medication Summary:   Medications  aspirin EC tablet 81 mg (has  no administration in time range)  clopidogrel (PLAVIX) tablet 75 mg (has no administration in time range)  hydrALAZINE (APRESOLINE) injection 10 mg (has no administration in time range)  potassium chloride  10 mEq in 100 mL IVPB (has no administration in time range)  iohexol (OMNIPAQUE) 350 MG/ML injection 75 mL (75 mLs Intravenous Contrast Given 01/28/24 2322)  clopidogrel (PLAVIX) tablet 300 mg (300 mg Oral Given 01/29/24 0042)          Clinical Impression:  1. Weakness   2. Cerebrovascular accident (CVA), unspecified mechanism (HCC)      Admit   Final Clinical Impression(s) / ED Diagnoses Final diagnoses:  Weakness  Cerebrovascular accident  (CVA), unspecified mechanism (HCC)    Rx / DC Orders ED Discharge Orders     None         Jerral Meth, MD 01/29/24 0045  "

## 2024-01-28 NOTE — ED Provider Triage Note (Addendum)
 Emergency Medicine Provider Triage Evaluation Note  Marisa [name] , a 63 y.o. female  was evaluated in triage.  Pt complains of weakness.  Review of Systems    Physical Exam  Ht 5' (1.524 m)   Wt 111.1 kg   BMI 47.85 kg/m  Gen:   Awake, no distress   Resp:  Normal effort  MSK:   Moves extremities without difficulty  Other:    Medical Decision Making  Medically screening exam initiated at 10:52 PM.  Appropriate orders placed.  Marisa [name] was informed that the remainder of the evaluation will be completed by another provider, this initial triage assessment does not replace that evaluation, and the importance of remaining in the ED until their evaluation is complete.  Patient presented with weakness, slurred speech.  Here with right side husband at bedside reports patient seemed a bit more confused and lethargic at about 5 PM.  They were out at that point, he noticed that 7 PM when she got out of the car she was having or difficult time walking, noticed that she was dragging her right leg a bit.  Patient then did a FaceTime with daughter at 12 PM and noted that the patient appeared to be having slurred speech and a right-sided facial droop.  Patient is not on blood thinners.  She has a history of diabetes and hypertension.  Activated as a code stroke.   Marisa Fairy DASEN, DO 01/28/24 2253    Marisa Fairy T, DO 01/28/24 [id number]

## 2024-01-28 NOTE — Consult Note (Signed)
 [facility] Consult Services   Patient Name:   Marisa [name], Marisa [name] Date of Birth:   30-Apr-1960 Identification Number:   MRN - [id number] Date of Service:   01/28/2024 22:47:23  Diagnosis:       I63.00 - Cerebrovascular accident (CVA) due to thrombosis of precerebral artery ([facility])  Impression:      Patient presents to the ED for evaluation of slurred speech and right-sided numbness and weakness. On my exam, patient was mildly somnolent, had very mild speech delay, symmetric smile, visual fields intact, no drift in her extremities but reported decrease sensation to light touch in the right hemibody. NIHSS of 3. Her CT head demonstrated no acute abnormalities and CT angiogram is negative for LVO. Patient was not a candidate for IV thrombolytics due to being outside of the time window. Patient's symptoms are concerning for subcortical CVA and recommend initiating dual antiplatelet therapy and admit for MRI brain and stroke workup.  Our recommendations are outlined below.  Recommendations:        Stroke/Telemetry Floor       Neuro Checks (Q4)       Bedside Swallow Eval       DVT Prophylaxis       IV Fluids, Normal Saline       Head of Bed 30 Degrees       Euglycemia and Avoid Hyperthermia (PRN Acetaminophen )       Bolus with Clopidogrel 300 mg bolus x1 and initiate dual antiplatelet therapy with Aspirin 81 mg daily and Clopidogrel 75 mg daily       Antihypertensives PRN if Blood pressure is greater than 220/120 or there is a concern for End organ damage/contraindications for permissive HTN. If blood pressure is greater than 220/120 give labetalol PO or IV or Vasotec IV with a goal of 15% reduction in BP during the first 24 hours.       Obtain MRI brain without contrast       Check lipid profile, A1c       Continue high intensity statin for goal LDL < 70       Obtain TTE, monitor on telemetry  Sign Out:       Discussed with Emergency Department  Provider    ------------------------------------------------------------------------------  Advanced Imaging: CTA Head and Neck Completed.  LVO:No  Patient is not a candidate for NIR   Metrics: Last Known Well: 01/28/2024 17:30:00 Arrival Time: 01/28/2024 22:32:00 Activation Time: 01/28/2024 22:47:23 Initial Response Time: 01/28/2024 22:50:00 Symptoms: Slurred speech, right-sided numbness and weakness. Initial patient interaction: 01/28/2024 23:07:03 NIHSS Assessment Completed: 01/28/2024 23:13:15 Patient is not a candidate for Thrombolytic. Thrombolytic Medical Decision: 01/28/2024 23:13:17 Patient was not deemed candidate for Thrombolytic because of following reasons: LKW outside 4.5 hr window. .  CT Head: I personally reviewed all the CT images that were available to me and it showed: No acute abnormalities  Primary Provider Notified of Diagnostic Impression and Management Plan on: 01/28/2024 23:50:55    ------------------------------------------------------------------------------  History of Present Illness: Patient is a 63 year old Female.  Patient was brought by private transportation with symptoms of Slurred speech, right-sided numbness and weakness. Patient presents to the ED for evaluation of slurred speech and right-sided numbness weakness. Patient's husband reports that he noticed she seemed off and more lethargic starting around 5:30 PM this evening. At 7 PM he notes that her gait was unsteady and she was dragging one of her legs. She was able to help patient into her bedroom and she laid  down and at 9 PM she was talking to her daughter on the phone and noticed that her speech was slurred. States that she has never had more symptoms in the past. Denies any history of stroke and does not take any AP/AC.    Past Medical History:      Hypertension      Diabetes Mellitus      Hyperlipidemia      There is no history of Stroke Other PMH:   OSA  Medications:  No Anticoagulant use  No Antiplatelet use Reviewed EMR for current medications  Allergies:  Reviewed  Social History: Smoking: No  Family History:  There is no family history of premature cerebrovascular disease pertinent to this consultation  ROS : 14 Points Review of Systems was performed and was negative except mentioned in HPI.  Past Surgical History: There Is No Surgical History Contributory To Todays Visit     Examination: BP(217/119), Pulse(93), 1A: Level of Consciousness - Arouses to minor stimulation + 1 1B: Ask Month and Age - Both Questions Right + 0 1C: Blink Eyes & Squeeze Hands - Performs Both Tasks + 0 2: Test Horizontal Extraocular Movements - Normal + 0 3: Test Visual Fields - No Visual Loss + 0 4: Test Facial Palsy (Use Grimace if Obtunded) - Normal symmetry + 0 5A: Test Left Arm Motor Drift - No Drift for 10 Seconds + 0 5B: Test Right Arm Motor Drift - No Drift for 10 Seconds + 0 6A: Test Left Leg Motor Drift - No Drift for 5 Seconds + 0 6B: Test Right Leg Motor Drift - No Drift for 5 Seconds + 0 7: Test Limb Ataxia (FNF/Heel-Shin) - No Ataxia + 0 8: Test Sensation - Mild-Moderate Loss: Less Sharp/More Dull + 1 9: Test Language/Aphasia - Mild-Moderate Aphasia: Some Obvious Changes, Without Significant Limitation + 1 10: Test Dysarthria - Normal + 0 11: Test Extinction/Inattention - No abnormality + 0  NIHSS Score: 3   Pre-Morbid Modified Rankin Scale: 0 Points = No symptoms at all  Spoke with : Dr. Jerral I reviewed the available imaging via Rapid and initiated discussion with the primary provider  This consult was conducted in real time using interactive audio and video technology. Patient was informed of the technology being used for this visit and agreed to proceed. Patient located in hospital and provider located at home/office setting.   Patient is being evaluated for possible acute neurologic impairment and high  probability of imminent or life-threatening deterioration. I spent total of 45 minutes providing care to this patient, including time for face to face visit via telemedicine, review of medical records, imaging studies and discussion of findings with providers, the patient and/or family.    Dr Sheena Cairo   [facility] For Inpatient follow-up with [facility] physician please call [facility] at [phone]. As we are not an outpatient service for any post hospital discharge needs please contact the hospital for assistance. If you have any questions for the [facility] physicians or need to reconsult for clinical or diagnostic changes please contact us  via [facility] at [phone].  Non-radiologist review of imaging performed to assist with emergent clinical decision-making. Remote physician workstations do not possess the same resolution, calibration, or diagnostic capabilities as hospital-based radiology reading stations, and formal radiologist read is necessary.   Signature : Sheena Cairo

## 2024-01-29 ENCOUNTER — Observation Stay (HOSPITAL_COMMUNITY)

## 2024-01-29 DIAGNOSIS — R531 Weakness: Secondary | ICD-10-CM

## 2024-01-29 DIAGNOSIS — R2990 Unspecified symptoms and signs involving the nervous system: Secondary | ICD-10-CM | POA: Diagnosis present

## 2024-01-29 LAB — URINALYSIS, ROUTINE W REFLEX MICROSCOPIC
Bilirubin Urine: NEGATIVE
Glucose, UA: NEGATIVE mg/dL
Hgb urine dipstick: NEGATIVE
Ketones, ur: NEGATIVE mg/dL
Leukocytes,Ua: NEGATIVE
Nitrite: NEGATIVE
Protein, ur: NEGATIVE mg/dL
Specific Gravity, Urine: 1.01 (ref 1.005–1.030)
pH: 7 (ref 5.0–8.0)

## 2024-01-29 LAB — URINE DRUG SCREEN
Amphetamines: NEGATIVE
Barbiturates: NEGATIVE
Benzodiazepines: NEGATIVE
Cocaine: NEGATIVE
Fentanyl: NEGATIVE
Methadone Scn, Ur: NEGATIVE
Opiates: NEGATIVE
Tetrahydrocannabinol: NEGATIVE

## 2024-01-29 LAB — GLUCOSE, CAPILLARY
Glucose-Capillary: 124 mg/dL — ABNORMAL HIGH (ref 70–99)
Glucose-Capillary: 134 mg/dL — ABNORMAL HIGH (ref 70–99)
Glucose-Capillary: 118 mg/dL — ABNORMAL HIGH (ref 70–99)

## 2024-01-29 LAB — CBG MONITORING, ED
Glucose-Capillary: 148 mg/dL — ABNORMAL HIGH (ref 70–99)
Glucose-Capillary: 128 mg/dL — ABNORMAL HIGH (ref 70–99)

## 2024-01-29 MED ORDER — ENOXAPARIN SODIUM 40 MG/0.4ML IJ SOSY
40.0000 mg | PREFILLED_SYRINGE | INTRAMUSCULAR | Status: DC
  Administered 2024-01-30 – 2024-02-05 (×6): 40 mg via SUBCUTANEOUS
  Filled 2024-01-29 (×6): qty 0.4

## 2024-01-29 MED ORDER — DAPAGLIFLOZIN PROPANEDIOL 10 MG PO TABS
10.0000 mg | ORAL_TABLET | Freq: Every day | ORAL | Status: DC
  Administered 2024-01-30 – 2024-02-05 (×6): 10 mg via ORAL
  Filled 2024-01-29 (×7): qty 1

## 2024-01-29 MED ORDER — INSULIN ASPART 100 UNIT/ML IJ SOLN: 0.0000 [IU] | Freq: Three times a day (TID) | INTRAMUSCULAR | Status: DC

## 2024-01-29 MED ORDER — ACETAMINOPHEN 650 MG RE SUPP: 650.0000 mg | RECTAL | Status: AC | PRN

## 2024-01-29 MED ORDER — IRBESARTAN 75 MG PO TABS
37.5000 mg | ORAL_TABLET | Freq: Every day | ORAL | Status: DC
  Administered 2024-01-30 – 2024-02-05 (×6): 37.5 mg via ORAL
  Filled 2024-01-29 (×8): qty 0.5

## 2024-01-29 MED ORDER — HYDRALAZINE HCL 20 MG/ML IJ SOLN: 5.0000 mg | Freq: Four times a day (QID) | INTRAMUSCULAR | Status: DC | PRN

## 2024-01-29 MED ORDER — POTASSIUM CHLORIDE 10 MEQ/100ML IV SOLN
10.0000 meq | Freq: Once | INTRAVENOUS | Status: AC
  Administered 2024-01-29: 10 meq via INTRAVENOUS
  Filled 2024-01-29: qty 100

## 2024-01-29 MED ORDER — INSULIN ASPART 100 UNIT/ML IJ SOLN: 0.0000 [IU] | Freq: Every day | INTRAMUSCULAR | Status: DC

## 2024-01-29 MED ORDER — HYDRALAZINE HCL 20 MG/ML IJ SOLN: 5.0000 mg | INTRAMUSCULAR | Status: DC | PRN

## 2024-01-29 MED ORDER — LABETALOL HCL 5 MG/ML IV SOLN: 5.0000 mg | INTRAVENOUS | Status: DC | PRN

## 2024-01-29 MED ORDER — FOLIC ACID 1 MG PO TABS
1.0000 mg | ORAL_TABLET | Freq: Every day | ORAL | Status: DC
  Administered 2024-01-30 – 2024-02-05 (×6): 1 mg via ORAL
  Filled 2024-01-29 (×6): qty 1

## 2024-01-29 MED ORDER — ATORVASTATIN CALCIUM 40 MG PO TABS
40.0000 mg | ORAL_TABLET | ORAL | Status: DC
  Administered 2024-01-29 – 2024-01-30 (×2): 40 mg via ORAL
  Filled 2024-01-29 (×2): qty 1

## 2024-01-29 MED ORDER — SENNOSIDES-DOCUSATE SODIUM 8.6-50 MG PO TABS
1.0000 | ORAL_TABLET | Freq: Every evening | ORAL | Status: DC | PRN
  Administered 2024-01-31: 1 via ORAL
  Filled 2024-01-29: qty 1

## 2024-01-29 MED ORDER — STROKE: EARLY STAGES OF RECOVERY BOOK
Freq: Once | Status: AC
  Filled 2024-01-29 (×2): qty 1

## 2024-01-29 MED ORDER — ASPIRIN 81 MG PO TBEC
81.0000 mg | DELAYED_RELEASE_TABLET | Freq: Every day | ORAL | Status: DC
  Administered 2024-01-29 – 2024-02-04 (×8): 81 mg via ORAL
  Filled 2024-01-29 (×8): qty 1

## 2024-01-29 MED ORDER — ACETAMINOPHEN 325 MG PO TABS
650.0000 mg | ORAL_TABLET | ORAL | Status: AC | PRN
  Administered 2024-01-29: 650 mg via ORAL
  Filled 2024-01-29: qty 2

## 2024-01-29 MED ORDER — ACETAMINOPHEN 160 MG/5ML PO SOLN: 650.0000 mg | ORAL | Status: AC | PRN

## 2024-01-29 MED ORDER — NEBIVOLOL HCL 10 MG PO TABS
10.0000 mg | ORAL_TABLET | Freq: Every day | ORAL | Status: DC
  Administered 2024-01-29 – 2024-02-05 (×7): 10 mg via ORAL
  Filled 2024-01-29 (×8): qty 1

## 2024-01-29 MED ORDER — ENOXAPARIN SODIUM 40 MG/0.4ML IJ SOSY: 40.0000 mg | PREFILLED_SYRINGE | INTRAMUSCULAR | Status: DC

## 2024-01-29 MED ORDER — AMLODIPINE BESYLATE 10 MG PO TABS: 10.0000 mg | ORAL_TABLET | Freq: Every day | ORAL | Status: DC

## 2024-01-29 MED ORDER — INSULIN ASPART 100 UNIT/ML IJ SOLN
0.0000 [IU] | Freq: Three times a day (TID) | INTRAMUSCULAR | Status: DC
  Administered 2024-01-29: 3 [IU] via SUBCUTANEOUS
  Administered 2024-01-30: 4 [IU] via SUBCUTANEOUS
  Administered 2024-01-30: 3 [IU] via SUBCUTANEOUS
  Administered 2024-01-30 – 2024-01-31 (×2): 4 [IU] via SUBCUTANEOUS
  Administered 2024-01-31 (×2): 3 [IU] via SUBCUTANEOUS
  Administered 2024-02-01: 4 [IU] via SUBCUTANEOUS
  Administered 2024-02-01: 3 [IU] via SUBCUTANEOUS
  Administered 2024-02-01: 7 [IU] via SUBCUTANEOUS
  Administered 2024-02-02: 3 [IU] via SUBCUTANEOUS
  Administered 2024-02-02: 4 [IU] via SUBCUTANEOUS
  Administered 2024-02-03 (×2): 3 [IU] via SUBCUTANEOUS
  Administered 2024-02-03: 7 [IU] via SUBCUTANEOUS
  Administered 2024-02-04: 3 [IU] via SUBCUTANEOUS
  Administered 2024-02-04 (×2): 4 [IU] via SUBCUTANEOUS
  Administered 2024-02-05: 3 [IU] via SUBCUTANEOUS
  Administered 2024-02-05: 7 [IU] via SUBCUTANEOUS
  Filled 2024-01-29: qty 1
  Filled 2024-01-29 (×2): qty 4
  Filled 2024-01-29: qty 3
  Filled 2024-01-29: qty 7
  Filled 2024-01-29 (×3): qty 4
  Filled 2024-01-29: qty 3
  Filled 2024-01-29: qty 1
  Filled 2024-01-29: qty 3
  Filled 2024-01-29: qty 4
  Filled 2024-01-29 (×4): qty 3
  Filled 2024-01-29: qty 7
  Filled 2024-01-29: qty 3

## 2024-01-29 MED ORDER — HYDRALAZINE HCL 20 MG/ML IJ SOLN
10.0000 mg | Freq: Four times a day (QID) | INTRAMUSCULAR | Status: DC | PRN
  Administered 2024-01-29: 10 mg via INTRAVENOUS
  Filled 2024-01-29: qty 1

## 2024-01-29 MED ORDER — CLOPIDOGREL BISULFATE 75 MG PO TABS
75.0000 mg | ORAL_TABLET | Freq: Every day | ORAL | Status: DC
  Administered 2024-01-30 – 2024-02-05 (×6): 75 mg via ORAL
  Filled 2024-01-29 (×6): qty 1

## 2024-01-29 MED ORDER — CHLORTHALIDONE 25 MG PO TABS
25.0000 mg | ORAL_TABLET | Freq: Every day | ORAL | Status: DC
  Administered 2024-01-29 – 2024-02-05 (×7): 25 mg via ORAL
  Filled 2024-01-29 (×8): qty 1

## 2024-01-29 MED ORDER — DAPAGLIFLOZIN PROPANEDIOL 10 MG PO TABS: 10.0000 mg | ORAL_TABLET | Freq: Every day | ORAL | Status: DC

## 2024-01-29 MED ORDER — POTASSIUM CHLORIDE 10 MEQ/100ML IV SOLN
10.0000 meq | INTRAVENOUS | Status: AC
  Administered 2024-01-29 (×3): 10 meq via INTRAVENOUS
  Filled 2024-01-29: qty 100

## 2024-01-29 MED ORDER — HYDRALAZINE HCL 20 MG/ML IJ SOLN: 10.0000 mg | Freq: Three times a day (TID) | INTRAMUSCULAR | Status: DC | PRN

## 2024-01-29 NOTE — Progress Notes (Signed)
 Asked patient if she wants to go on CPAP.  Patient stated that she feels fine and is resting well and doesn't need the CPAP at this time.  Patient agreed to use to use the call bell to let us  know if she changes her mind about the CPAP.  RT will continue to monitor.

## 2024-01-29 NOTE — Hospital Course (Signed)
 [name] is a 64 year old female with history of hypertension, non-insulin-dependent diabetes mellitus, hyperlipidemia.  01/28/2024: Patient presented to the ED for chief concerns of left upper and lower extremity weakness.  01/29/2024: Patient admitted to hospitalist service pending bed availability.  Most updated vitals at the time of my evaluation showed temperature of 98.1, respiration rate 20, heart rate 638, blood pressure 198/93, SpO2 98% on room air.  Serum sodium is 140, potassium 3.1, chloride 100, bicarb 23, BUN 12 serum creatinine 0.79, eGFR greater than 60, nonfasting blood glucose 132, WBC 8.6, hemoglobin 13, platelets 273.  1/1: I assumed care of the patient and completed the admission via virtual encounter.

## 2024-01-29 NOTE — Assessment & Plan Note (Addendum)
 Neurology has been consulted and we appreciate further recommendations Teleneurologist recommends bolus with clopidogrel 300 mg one-time dose, and initiate patient on dual antiplatelet therapy with aspirin 81 mg daily and clopidogrel 75 mg daily Complete echo MRI of the brain  Fasting lipid and A1c ordered Permissive hypertension per neurology recommendations Frequent neuro vascular checks PT, OT Fall precaution

## 2024-01-29 NOTE — ED Notes (Signed)
 EDP made aware of pts change in NIH score.

## 2024-01-29 NOTE — ED Notes (Signed)
 Dr. Sherre performing virtual admission assessment at this time.

## 2024-01-29 NOTE — ED Notes (Signed)
 EDP informed of change to NIHSS

## 2024-01-29 NOTE — Assessment & Plan Note (Signed)
 Atorvastatin 40 mg daily

## 2024-01-29 NOTE — H&P (Addendum)
 " History and Physical - Telemedicine  [name] FMW:[id number] DOB: January 21, 1961 DOA: 01/28/2024  PCP: Jarold Medici, MD  Patient coming from: home  Referring provider: Dr. Jerral, EDP Telemedicine provider: Dr. Sherre Patient location: [facility] Referring diagnosis: stroke like symptoms Patient name and DOB verified: Patient was able to verify her first and last name: Marisa [name], date of birth: [birth year]. Patient consented to Telemedicine Evaluation: yes RN virtual assistant: Arlyne Fujisawa, RN Video encounter time and date: 01/29/2024 and at approximately: 10:33a  Chief Concern: left sided weakness  HPI: [name] is a 64 year old female with history of hypertension, non-insulin-dependent diabetes mellitus, hyperlipidemia.  01/28/2024: Patient presented to the ED for chief concerns of left upper and lower extremity weakness.  01/29/2024: Patient admitted to hospitalist service pending bed availability.  Most updated vitals at the time of my evaluation showed temperature of 98.1, respiration rate 20, heart rate 638, blood pressure 198/93, SpO2 98% on room air.  Serum sodium is 140, potassium 3.1, chloride 100, bicarb 23, BUN 12 serum creatinine 0.79, eGFR greater than 60, nonfasting blood glucose 132, WBC 8.6, hemoglobin 13, platelets 273.  1/1: I assumed care of the patient and completed the admission via virtual encounter. ----------------------------------- At bedside via virtual medicine encounter, patient was able to confirm her first and last name, her date of birth with some verbal difficulty.  Patient reports that at approximately 3 AM on 12/31, she developed weakness of the left upper and lower extremity. She reports she was still able to ambulate with some difficulty. She reports this is never happened before.    She denies any fall, chest pain, shortness of breath, dysuria, recent illnesses or infection, diarrhea, blood  in her stool, blood in your urine, fever, chills, congestion. She endorses that her husband was recently sick with sinusitis though she did not have any of the symptoms.  She denies personal history of a stroke. She denies personal history of heart attacks.  Social history: Patient lives at home with her husband.  She denies tobacco, EtOH, recreational drug use.  She is retired.  ROS: Constitutional: no weight change, no fever ENT/Mouth: no sore throat, no rhinorrhea Eyes: no eye pain, no vision changes Cardiovascular: no chest pain, no dyspnea,  no edema, no palpitations Respiratory: no cough, no sputum, no wheezing Gastrointestinal: no nausea, no vomiting, no diarrhea, no constipation Genitourinary: no urinary incontinence, no dysuria, no hematuria Musculoskeletal: no arthralgias, no myalgias, + weakness of the left lower and upper extremities. Skin: no skin lesions, no pruritus, Neuro: + weakness, no loss of consciousness, no syncope Psych: no anxiety, no depression, no decrease appetite Heme/Lymph: no bruising, no bleeding  Assessment/Plan  Principal Problem:   Stroke-like symptom Active Problems:   Uncontrolled type 2 diabetes mellitus with hyperglycemia (HCC)   Essential hypertension, benign   Obesity, Class III, BMI 40-49.9 (morbid obesity) (HCC)   Pure hypercholesterolemia   OSA on CPAP   Right sided weakness   Assessment and Plan:  * Stroke-like symptom Neurology has been consulted and we appreciate further recommendations Teleneurologist recommends bolus with clopidogrel 300 mg one-time dose, and initiate patient on dual antiplatelet therapy with aspirin 81 mg daily and clopidogrel 75 mg daily Complete echo MRI of the brain  Fasting lipid and A1c ordered Permissive hypertension per neurology recommendations Frequent neuro vascular checks PT, OT Fall precaution  Uncontrolled type 2 diabetes mellitus with hyperglycemia (HCC) Home metformin  will not be resumed on  admission Home Farxiga  resumed  Insulin SSI with at bedtime coverage ordered Goal inpatient blood glucose levels 140-180  Essential hypertension, benign Home scheduled antihypertensive medication will not be resumed on admission as patient requires permissive hypertension Labetolol 5 mg IV every 4 hours as needed for SBP > 220 or diastolic blood pressure > 120, 36 hours ordered A.m. team to resume home antihypertensive medication when benefits outweigh the risk  Pure hypercholesterolemia Atorvastatin  40 mg daily  Obesity, Class III, BMI 40-49.9 (morbid obesity) (HCC) This complicates overall care and prognosis.   Right sided weakness Pending stroke workup  Chart reviewed.   DVT prophylaxis: Pharmacologic DVT not initiated on admission.  AM team to initiate pharmacologic DVT when the benefits outweigh the risk.  Code Status: Full code Diet: Heart healthy/carb modified Family Communication: Updated spouse and other family members at bedside Disposition Plan: Pending clinical course, final neurology recommendation and MRI of the brain without contrast Consults called: Neurology Admission status: Telemetry  Past Medical History:  Diagnosis Date   Borderline diabetes    Hypertension    Past Surgical History:  Procedure Laterality Date   ABDOMINAL HYSTERECTOMY     CESAREAN SECTION     SHOULDER SURGERY     Social History:  reports that she has never smoked. She has never used smokeless tobacco. She reports that she does not drink alcohol and does not use drugs.  Allergies[1] Family History  Problem Relation Age of Onset   Diabetes Mother    Hypertension Mother    Family history: Family history reviewed and not pertinent.  Prior to Admission medications  Medication Sig Start Date End Date Taking? Authorizing Provider  ACCU-CHEK GUIDE TEST test strip USE 1 STRIP TO TEST THREE TIMES A DAY TO CHECK BLOOD SUGARS. (KEEP UNUSED STRIPS IN ORIGINAL SEALED CONTAINER BETWEEN USES)  03/27/23   Jarold Medici, MD  Accu-Chek Softclix Lancets lancets USE LANCET TO TEST THREE TIMES A DAY - (DISCARD LANCET IN APPROPRIATE CONTAINER IMMEDIATELY AFTER USE. USE A NEW LANCET FOR EACH TESTING) 03/27/23   Jarold Medici, MD  amLODipine  (NORVASC ) 10 MG tablet Take 1 tablet (10 mg total) by mouth daily. 11/26/23 11/25/24  Jarold Medici, MD  atorvastatin  (LIPITOR) 40 MG tablet Take one tablet by mouth everyday except sundays 02/19/23   Jarold Medici, MD  Blood Glucose Monitoring Suppl (ACCU-CHEK GUIDE ME) w/Device KIT Use as directed to check blood sugars three times daily E11.65 02/15/22   Jarold Medici, MD  chlorthalidone  (HYGROTON ) 25 MG tablet Take 1 tablet (25 mg total) by mouth daily. 12/18/23 03/17/24  Raford Riggs, MD  dapagliflozin  propanediol (FARXIGA ) 10 MG TABS tablet TAKE ONE TABLET BY MOUTH EVERY DAY BEFORE BREAKFAST 03/27/23   Jarold Medici, MD  folic acid  (FOLVITE ) 1 MG tablet TAKE ONE TABLET BY MOUTH EVERY DAY 03/27/23   Jarold Medici, MD  gabapentin  (NEURONTIN ) 100 MG capsule Take 1 capsule (100 mg total) by mouth daily. Patient not taking: Reported on 01/13/2024 07/08/23 07/07/24  Jarold Medici, MD  LINZESS  145 MCG CAPS capsule TAKE ONE CAPSULE BY MOUTH EVERY DAY BEFORE FIRST MEAL OF THE DAY 03/27/23   Jarold Medici, MD  metFORMIN  (GLUCOPHAGE ) 1000 MG tablet Take one tab po twice daily 11/26/23   Jarold Medici, MD  nebivolol  (BYSTOLIC ) 10 MG tablet Take 1 tablet (10 mg total) by mouth daily. 09/01/23   Jarold Medici, MD  olmesartan  (BENICAR ) 40 MG tablet Take 1 tablet (40 mg total) by mouth daily. 12/18/23   Raford Riggs, MD  Semaglutide , 1 MG/DOSE, (OZEMPIC , 1  MG/DOSE,) 4 MG/3ML SOPN INJECT 1 MG SUBCUTANEOUSLY ONCE A WEEK - (ADMINISTER BY SUBCUTANEOUS INJECTION INTO THE ABDOMEN, THIGH, OR UPPER ARM AT ANY TIME OF DAY ON THE SAME DAY EACH WEEK) DISCARD PEN 56 DAYS AFTER FIRST USE 07/29/22   Jarold Medici, MD  Vitamin D , Ergocalciferol , (DRISDOL ) 1.25 MG (50000 UNIT)  CAPS capsule TAKE ONE CAPSULE BY MOUTH EVERY SEVEN DAYS (MAY CONTAIN SOY OR PEANUT--TALK TO YOUR DOCTOR BEFORE TAKING IF YOU ARE ALLERGIC) 05/27/23   Jarold Medici, MD   Physical Exam completed with assistance of: Arlyne Fujisawa, RN, who was at bedside during this portion of the virtual encounter:  Vitals:   01/29/24 1130 01/29/24 1300 01/29/24 1319 01/29/24 1321  BP:  (!) 190/97  (!) 191/99  Pulse: 81   74  Resp: (!) 23 20  19   Temp:   98.6 F (37 C)   TempSrc:   Oral   SpO2: 99%   100%  Weight:      Height:       Constitutional: appears age-appropriate, anxious Eyes: EOMI,  conjunctivae normal HENMT: Mucous membranes are moist. Hearing appropriate.  Left-sided mouth droop Neck: normal, supple, no masses, no thyromegaly Respiratory: clear to auscultation bilaterally with diminished lung sounds in the bilateral bases, no wheezing. Normal respiratory effort. No accessory muscle use.  Cardiovascular: Regular rate and rhythm, no murmurs.  Normal heart sounds. Abdomen: no tenderness. Bowel sounds positive.  Musculoskeletal: No joint deformity upper and lower extremities.  Decreased ROM of the left lower extremity, no contractures, no atrophy.  No range of motion of the left upper extremity. Skin: no rashes, ulcers on visible skin Neurologic: Strength is appropriate upper extremities.  Psychiatric: Normal judgment and insight. Alert and oriented x 3. Normal mood.   EKG: independently reviewed, showing sinus rhythm with rate of 77, QTc 463  Chest x-ray on Admission: Not indicated at this time.  CT ANGIO HEAD NECK W WO CM (CODE STROKE) Result Date: 01/28/2024 EXAM: CTA HEAD AND NECK WITH 01/28/2024 11:23:40 PM TECHNIQUE: CTA of the head and neck was performed with the administration of 75 mL of iohexol (OMNIPAQUE) 350 MG/ML injection. Multiplanar 2D and/or 3D reformatted images are provided for review. Automated exposure control, iterative reconstruction, and/or weight based adjustment  of the mA/kV was utilized to reduce the radiation dose to as low as reasonably achievable. Stenosis of the internal carotid arteries measured using NASCET criteria. COMPARISON: None available CLINICAL HISTORY: Neuro deficit, acute, stroke suspected. FINDINGS: CTA NECK: AORTIC ARCH AND ARCH VESSELS: No dissection or arterial injury. No significant stenosis of the brachiocephalic or subclavian arteries. CERVICAL CAROTID ARTERIES: No dissection, arterial injury, or hemodynamically significant stenosis by NASCET criteria. CERVICAL VERTEBRAL ARTERIES: No dissection, arterial injury, or significant stenosis. LUNGS AND MEDIASTINUM: Unremarkable. SOFT TISSUES: Enlarged thyroid gland with a 2.2 cm hypodense nodule in the left lobe. BONES: No acute abnormality. CTA HEAD: ANTERIOR CIRCULATION: No significant stenosis of the internal carotid arteries. No significant stenosis of the anterior cerebral arteries. No significant stenosis of the middle cerebral arteries. No aneurysm. POSTERIOR CIRCULATION: No significant stenosis of the posterior cerebral arteries. No significant stenosis of the basilar artery. No significant stenosis of the vertebral arteries. No aneurysm. OTHER: No dural venous sinus thrombosis on this non-dedicated study. IMPRESSION: 1. No large vessel occlusion, hemodynamically significant stenosis, or aneurysm in the head or neck. 2. Enlarged thyroid gland with 2.2 cm hypodense nodule in the left lobe; recommend non-emergent thyroid ultrasound. Electronically signed by: Franky Stanford MD 01/28/2024 11:32  PM EST RP Workstation: [id number]   CT HEAD CODE STROKE WO CONTRAST (LKW 0-4.5h, LVO 0-24h) Result Date: 01/28/2024 EXAM: CT HEAD WITHOUT CONTRAST 01/28/2024 11:06:46 PM TECHNIQUE: CT of the head was performed without the administration of intravenous contrast. Automated exposure control, iterative reconstruction, and/or weight based adjustment of the mA/kV was utilized to reduce the radiation dose to as low  as reasonably achievable. COMPARISON: None available. CLINICAL HISTORY: Neuro deficit, acute, stroke suspected. FINDINGS: BRAIN AND VENTRICLES: No acute hemorrhage. No evidence of acute infarct. The [place] Stroke Program Early CT Score (ASPECTS) is 10. No hydrocephalus. No extra-axial collection. No mass effect or midline shift. ORBITS: No acute abnormality. SINUSES: No acute abnormality. SOFT TISSUES AND SKULL: No acute soft tissue abnormality. No skull fracture. IMPRESSION: 1. No acute intracranial abnormality. 2. ASPECTS score is 10. Communicated to Dr. Fairy Gravely at 11:10 pm on 01/28/2024 Electronically signed by: Franky Stanford MD 01/28/2024 11:11 PM EST RP Workstation: [id number]   Labs on Admission: I have personally reviewed following labs.  CBC: Recent Labs  Lab 01/28/24 2253  WBC 8.6  NEUTROABS 3.6  HGB 13.0  HCT 40.2  MCV 68.6*  PLT 273   Basic Metabolic Panel: Recent Labs  Lab 01/28/24 2253  NA 140  K 3.1*  CL 100  CO2 27  GLUCOSE 132*  BUN 12  CREATININE 0.79  CALCIUM  9.3   GFR: Estimated Creatinine Clearance: 80.4 mL/min (by C-G formula based on SCr of 0.79 mg/dL).  Liver Function Tests: Recent Labs  Lab 01/28/24 2253  AST 17  ALT 10  ALKPHOS 95  BILITOT 0.5  PROT 7.8  ALBUMIN 4.2   Coagulation Profile: Recent Labs  Lab 01/28/24 2253  INR 1.0   CBG: Recent Labs  Lab 01/28/24 2248 01/29/24 0757 01/29/24 1258  GLUCAP 141* 148* 128*   Urine analysis:    Component Value Date/Time   COLORURINE STRAW (A) 01/29/2024 0005   APPEARANCEUR CLEAR 01/29/2024 0005   LABSPEC 1.010 01/29/2024 0005   PHURINE 7.0 01/29/2024 0005   GLUCOSEU NEGATIVE 01/29/2024 0005   HGBUR NEGATIVE 01/29/2024 0005   BILIRUBINUR NEGATIVE 01/29/2024 0005   BILIRUBINUR negative 11/26/2023 1636   BILIRUBINUR Negative 10/21/2022 1516   KETONESUR NEGATIVE 01/29/2024 0005   PROTEINUR NEGATIVE 01/29/2024 0005   UROBILINOGEN 0.2 11/26/2023 1636   UROBILINOGEN 0.2 07/31/2012  2358   NITRITE NEGATIVE 01/29/2024 0005   LEUKOCYTESUR NEGATIVE 01/29/2024 0005   This document was prepared using Dragon Voice Recognition software and may include unintentional dictation errors.  Dr. Sherre [facility] Location: [place]  If 7PM-7AM, please contact overnight-coverage provider If 7AM-7PM, please contact day attending provider [email or web address]  01/29/2024, 3:15 PM      [1] No Known Allergies  "

## 2024-01-29 NOTE — Plan of Care (Signed)
" °  Patient passed swallow screen.  diet ordered.  Everest Brod, MD [facility] 01/29/2024, 6:01 AM   "

## 2024-01-29 NOTE — ED Notes (Signed)
" °  NS called Bed Placement per ED Charge RN, No Bed Available or Discharges at this time. Bed placement and EDP informed of worsening condition and the option for ED to ED transfer. At this time pt will wait for bed per EDP.   "

## 2024-01-29 NOTE — Assessment & Plan Note (Signed)
 -  This complicates overall care and prognosis.

## 2024-01-29 NOTE — ED Notes (Signed)
 [facility] at bedside

## 2024-01-29 NOTE — Assessment & Plan Note (Signed)
 Pending stroke workup

## 2024-01-29 NOTE — Assessment & Plan Note (Addendum)
 Home scheduled antihypertensive medication will not be resumed on admission as patient requires permissive hypertension Labetolol 5 mg IV every 4 hours as needed for SBP > 220 or diastolic blood pressure > 120, 36 hours ordered A.m. team to resume home antihypertensive medication when benefits outweigh the risk

## 2024-01-29 NOTE — Plan of Care (Addendum)
 [facility] to [facility] telemetry bed transfer:  64 year old female past medical history of hypertension, DM type II and hyperlipidemia presented to [facility] with complaining of slurred speech, right-sided facial numbness and weakness.  At bedside patient found [place]man in, delayed speech.  In the ED patient being evaluated by neurology recommended admit for stroke workup. Neurology recommendation is following:        Stroke/Telemetry Floor       Neuro Checks (Q4)       Bedside Swallow Eval       DVT Prophylaxis       IV Fluids, Normal Saline       Head of Bed 30 Degrees       Euglycemia and Avoid Hyperthermia (PRN Acetaminophen )       Bolus with Clopidogrel 300 mg bolus x1 and initiate dual antiplatelet therapy with Aspirin 81 mg daily and Clopidogrel 75 mg daily       Antihypertensives PRN if Blood pressure is greater than 220/120 or there is a concern for End organ damage/contraindications for permissive HTN. If blood pressure is greater than 220/120 give labetalol PO or IV or Vasotec IV with a goal of 15% reduction in BP during the first 24 hours.       Obtain MRI brain without contrast       Check lipid profile, A1c       Continue high intensity statin for goal LDL < 70       Obtain TTE, monitor on telemetry    At presentation to ED patient found hypertensive however blood pressure has been improved.  Otherwise hemodynamically stable. Lab work, CBC unremarkable.  CMP showed low potassium 3.1 otherwise unremarkable.  Pending UA and UDS.  CT head no acute intracranial abnormality. CTA head and neck No large vessel occlusion, hemodynamically significant stenosis, or aneurysm in the head or neck. 2. Enlarged thyroid gland with 2.2 cm hypodense nodule in the left lobe.  Pending MRI of the brain.  In the ED patient received Plavix load.  Hospitalist consulted for further  evaluation and  management of  right-sided upper and lower extremity weakness-need for  stroke workup.  Need to inform on-call neurology upon arrival to [facility].    [facility] will assume care on arrival to accepting facility. Until arrival, care as per EDP. However, [facility] available 24/7 for questions and assistance. Check [email or web address] for on-call coverage. Nursing staff, please call [facility] Admits & Consults System-Wide number under Amion on patient's arrival so appropriate admitting provider can evaluate the pt.   Author: Lamario Mani, MD  [facility]

## 2024-01-29 NOTE — Assessment & Plan Note (Addendum)
 Home metformin  will not be resumed on admission Home Farxiga  resumed Insulin SSI with at bedtime coverage ordered Goal inpatient blood glucose levels 140-180

## 2024-01-29 NOTE — ED Notes (Signed)
" °  NS called [facility] for transport No Trucks Available at this time.  "

## 2024-01-29 NOTE — Plan of Care (Addendum)
" °  Patient's blood pressure is persistently elevated systolic blood pressure about 799 range and diastolic upper 90s range. Heart rate around 60-70.  Given patient is out of window for permissive hypertension in the setting of acute CVA.  Changing to IV hydralazine as needed instead of IV labetalol as labetalol will drop the heart rate further.  FYI pts family is asking why now you are changing her to hydralazine to be 160 parameters and not labetolol 220 parameters.  Asking for explanations.  Explained to patient's family that the reason of changing labetalol to hydralazine in the setting of concern for development of bradycardia.  -After receiving hydralazine patient reported started developing chest pain.  Again explained patient that hydralazine does not cause chest pain.  Nurse reported that patient has 9 beats of ventricular bigeminy.   Again chest pressure and bigeminy in the setting of elevated blood pressure not because of the side effect of IV hydralazine. - At home patient is on 4 different blood pressure medications include amlodipine , Bystolic , Omlesartan and hydrochlorothiazide . - Resuming by Bystolic  and hydrochlorothiazide  from tonight alongside with Ibesartan and amlodipine  from tomorrow morning. -Goal to gradually improving the blood pressure 15% in first hour and 25% in first 24 hours. - Continue IV hydralazine as needed. - EKG showing normal sinus rhythm heart rate 73 and left ventricular hypertrophy criteria. -Goal is to gradually improve the blood pressure  Micaela Speaker, MD [facility] 01/29/2024, 10:37 PM    "

## 2024-01-29 NOTE — ED Notes (Signed)
 RN performed rounds on pt and pt c/o increased weakness and headache. Repeat VS with 215/106 BP. Repeat NIHSS with increased weakness compared to previous NIHSS. MD notified of pt changes.

## 2024-01-30 ENCOUNTER — Observation Stay (HOSPITAL_COMMUNITY)

## 2024-01-30 DIAGNOSIS — R471 Dysarthria and anarthria: Secondary | ICD-10-CM | POA: Diagnosis present

## 2024-01-30 DIAGNOSIS — E669 Obesity, unspecified: Secondary | ICD-10-CM

## 2024-01-30 DIAGNOSIS — E785 Hyperlipidemia, unspecified: Secondary | ICD-10-CM

## 2024-01-30 DIAGNOSIS — Z8249 Family history of ischemic heart disease and other diseases of the circulatory system: Secondary | ICD-10-CM | POA: Diagnosis not present

## 2024-01-30 DIAGNOSIS — Z6841 Body Mass Index (BMI) 40.0 and over, adult: Secondary | ICD-10-CM | POA: Diagnosis not present

## 2024-01-30 DIAGNOSIS — D649 Anemia, unspecified: Secondary | ICD-10-CM | POA: Diagnosis not present

## 2024-01-30 DIAGNOSIS — R29711 NIHSS score 11: Secondary | ICD-10-CM | POA: Diagnosis present

## 2024-01-30 DIAGNOSIS — Z823 Family history of stroke: Secondary | ICD-10-CM | POA: Diagnosis not present

## 2024-01-30 DIAGNOSIS — E7800 Pure hypercholesterolemia, unspecified: Secondary | ICD-10-CM | POA: Diagnosis present

## 2024-01-30 DIAGNOSIS — Z833 Family history of diabetes mellitus: Secondary | ICD-10-CM | POA: Diagnosis not present

## 2024-01-30 DIAGNOSIS — E1151 Type 2 diabetes mellitus with diabetic peripheral angiopathy without gangrene: Secondary | ICD-10-CM | POA: Diagnosis not present

## 2024-01-30 DIAGNOSIS — E119 Type 2 diabetes mellitus without complications: Secondary | ICD-10-CM | POA: Diagnosis not present

## 2024-01-30 DIAGNOSIS — Z7984 Long term (current) use of oral hypoglycemic drugs: Secondary | ICD-10-CM

## 2024-01-30 DIAGNOSIS — I6389 Other cerebral infarction: Secondary | ICD-10-CM

## 2024-01-30 DIAGNOSIS — R2990 Unspecified symptoms and signs involving the nervous system: Secondary | ICD-10-CM | POA: Diagnosis not present

## 2024-01-30 DIAGNOSIS — G8111 Spastic hemiplegia affecting right dominant side: Secondary | ICD-10-CM | POA: Diagnosis not present

## 2024-01-30 DIAGNOSIS — E66813 Obesity, class 3: Secondary | ICD-10-CM | POA: Diagnosis present

## 2024-01-30 DIAGNOSIS — I639 Cerebral infarction, unspecified: Secondary | ICD-10-CM | POA: Diagnosis not present

## 2024-01-30 DIAGNOSIS — R29810 Facial weakness: Secondary | ICD-10-CM | POA: Diagnosis present

## 2024-01-30 DIAGNOSIS — G4733 Obstructive sleep apnea (adult) (pediatric): Secondary | ICD-10-CM | POA: Diagnosis present

## 2024-01-30 DIAGNOSIS — E041 Nontoxic single thyroid nodule: Secondary | ICD-10-CM | POA: Diagnosis present

## 2024-01-30 DIAGNOSIS — E1169 Type 2 diabetes mellitus with other specified complication: Secondary | ICD-10-CM | POA: Diagnosis not present

## 2024-01-30 DIAGNOSIS — I10 Essential (primary) hypertension: Secondary | ICD-10-CM

## 2024-01-30 DIAGNOSIS — Z7982 Long term (current) use of aspirin: Secondary | ICD-10-CM | POA: Diagnosis not present

## 2024-01-30 DIAGNOSIS — R739 Hyperglycemia, unspecified: Secondary | ICD-10-CM | POA: Diagnosis not present

## 2024-01-30 DIAGNOSIS — K5900 Constipation, unspecified: Secondary | ICD-10-CM | POA: Diagnosis not present

## 2024-01-30 DIAGNOSIS — G936 Cerebral edema: Secondary | ICD-10-CM | POA: Diagnosis present

## 2024-01-30 DIAGNOSIS — E1165 Type 2 diabetes mellitus with hyperglycemia: Secondary | ICD-10-CM

## 2024-01-30 DIAGNOSIS — R531 Weakness: Secondary | ICD-10-CM | POA: Diagnosis present

## 2024-01-30 DIAGNOSIS — N179 Acute kidney failure, unspecified: Secondary | ICD-10-CM | POA: Diagnosis not present

## 2024-01-30 DIAGNOSIS — Z7985 Long-term (current) use of injectable non-insulin antidiabetic drugs: Secondary | ICD-10-CM | POA: Diagnosis not present

## 2024-01-30 DIAGNOSIS — R609 Edema, unspecified: Secondary | ICD-10-CM | POA: Diagnosis not present

## 2024-01-30 DIAGNOSIS — Z79899 Other long term (current) drug therapy: Secondary | ICD-10-CM | POA: Diagnosis not present

## 2024-01-30 DIAGNOSIS — M25532 Pain in left wrist: Secondary | ICD-10-CM | POA: Diagnosis not present

## 2024-01-30 DIAGNOSIS — W19XXXD Unspecified fall, subsequent encounter: Secondary | ICD-10-CM | POA: Diagnosis not present

## 2024-01-30 DIAGNOSIS — Z794 Long term (current) use of insulin: Secondary | ICD-10-CM | POA: Diagnosis not present

## 2024-01-30 DIAGNOSIS — K5901 Slow transit constipation: Secondary | ICD-10-CM | POA: Diagnosis not present

## 2024-01-30 DIAGNOSIS — R7989 Other specified abnormal findings of blood chemistry: Secondary | ICD-10-CM | POA: Diagnosis not present

## 2024-01-30 DIAGNOSIS — W19XXXA Unspecified fall, initial encounter: Secondary | ICD-10-CM | POA: Diagnosis not present

## 2024-01-30 DIAGNOSIS — M7989 Other specified soft tissue disorders: Secondary | ICD-10-CM | POA: Diagnosis not present

## 2024-01-30 DIAGNOSIS — G4730 Sleep apnea, unspecified: Secondary | ICD-10-CM | POA: Diagnosis not present

## 2024-01-30 DIAGNOSIS — Z7902 Long term (current) use of antithrombotics/antiplatelets: Secondary | ICD-10-CM | POA: Diagnosis not present

## 2024-01-30 DIAGNOSIS — G8191 Hemiplegia, unspecified affecting right dominant side: Secondary | ICD-10-CM | POA: Diagnosis present

## 2024-01-30 DIAGNOSIS — E876 Hypokalemia: Secondary | ICD-10-CM | POA: Diagnosis not present

## 2024-01-30 LAB — GLUCOSE, CAPILLARY
Glucose-Capillary: 136 mg/dL — ABNORMAL HIGH (ref 70–99)
Glucose-Capillary: 155 mg/dL — ABNORMAL HIGH (ref 70–99)
Glucose-Capillary: 169 mg/dL — ABNORMAL HIGH (ref 70–99)
Glucose-Capillary: 162 mg/dL — ABNORMAL HIGH (ref 70–99)

## 2024-01-30 LAB — CBC
HCT: 41.7 % (ref 36.0–46.0)
Hemoglobin: 13.8 g/dL (ref 12.0–15.0)
MCH: 22.6 pg — ABNORMAL LOW (ref 26.0–34.0)
MCHC: 33.1 g/dL (ref 30.0–36.0)
MCV: 68.4 fL — ABNORMAL LOW (ref 80.0–100.0)
Platelets: 281 K/uL (ref 150–400)
RBC: 6.1 MIL/uL — ABNORMAL HIGH (ref 3.87–5.11)
RDW: 18.6 % — ABNORMAL HIGH (ref 11.5–15.5)
WBC: 7.3 K/uL (ref 4.0–10.5)
nRBC: 0 % (ref 0.0–0.2)

## 2024-01-30 LAB — BASIC METABOLIC PANEL WITH GFR
Anion gap: 14 (ref 5–15)
BUN: 11 mg/dL (ref 8–23)
CO2: 23 mmol/L (ref 22–32)
Calcium: 9.4 mg/dL (ref 8.9–10.3)
Chloride: 100 mmol/L (ref 98–111)
Creatinine, Ser: 0.74 mg/dL (ref 0.44–1.00)
GFR, Estimated: >60 mL/min
Glucose, Bld: 141 mg/dL — ABNORMAL HIGH (ref 70–99)
Potassium: 3.6 mmol/L (ref 3.5–5.1)
Sodium: 137 mmol/L (ref 135–145)

## 2024-01-30 LAB — ECHOCARDIOGRAM COMPLETE
AR max vel: 2.45 cm2
AV Area VTI: 2.2 cm2
AV Area mean vel: 2.29 cm2
AV Mean grad: 3.5 mmHg
AV Peak grad: 6.2 mmHg
Ao pk vel: 1.25 m/s
Area-P 1/2: 2.56 cm2
Height: 60 in
MV M vel: 1.26 m/s
MV Peak grad: 6.4 mmHg
MV VTI: 2.58 cm2
S' Lateral: 3.5 cm
Weight: 3919.99 [oz_av]

## 2024-01-30 LAB — LIPID PANEL
Cholesterol: 214 mg/dL — ABNORMAL HIGH (ref 0–200)
HDL: 45 mg/dL
LDL Cholesterol: 145 mg/dL — ABNORMAL HIGH (ref 0–99)
Total CHOL/HDL Ratio: 4.7 ratio
Triglycerides: 121 mg/dL
VLDL: 24 mg/dL (ref 0–40)

## 2024-01-30 LAB — HIV ANTIBODY (ROUTINE TESTING W REFLEX): HIV Screen 4th Generation wRfx: NONREACTIVE

## 2024-01-30 MED ORDER — ATORVASTATIN CALCIUM 80 MG PO TABS
80.0000 mg | ORAL_TABLET | ORAL | Status: DC
  Administered 2024-01-31 – 2024-02-05 (×4): 80 mg via ORAL
  Filled 2024-01-30 (×4): qty 1

## 2024-01-30 MED ORDER — HYDRALAZINE HCL 20 MG/ML IJ SOLN: 10.0000 mg | Freq: Four times a day (QID) | INTRAMUSCULAR | Status: DC | PRN

## 2024-01-30 MED ORDER — AMLODIPINE BESYLATE 5 MG PO TABS
5.0000 mg | ORAL_TABLET | Freq: Every day | ORAL | Status: DC
  Administered 2024-01-30 – 2024-02-05 (×6): 5 mg via ORAL
  Filled 2024-01-30 (×6): qty 1

## 2024-01-30 NOTE — Evaluation (Signed)
 Speech Language Pathology Evaluation Patient Details Name: Marisa [name] MRN: [id number] DOB: June 29, 1960 Today's Date: 01/30/2024 Time: 1045-1100 SLP Time Calculation (min) (ACUTE ONLY): 15 min  Problem List:  Patient Active Problem List   Diagnosis Date Noted   Right sided weakness 01/29/2024   Stroke-like symptom 01/29/2024   Primary osteoarthritis of left knee 09/03/2023   Facial tingling 07/08/2023   Tachycardia 05/27/2023   Skin lesion of hand 05/27/2023   Encounter for general adult medical examination w/o abnormal findings 10/21/2022   ASCUS of cervix with negative high risk HPV 10/21/2022   OSA on CPAP 08/21/2022   Chronic intermittent hypoxia with obstructive sleep apnea 10/03/2021   Snoring 10/03/2021   Sleeps in sitting position due to orthopnea 10/03/2021   Type 2 diabetes mellitus with microalbuminuria, without long-term current use of insulin (HCC) 10/03/2021   Pure hypercholesterolemia 08/30/2020   Uncontrolled type 2 diabetes mellitus with hyperglycemia (HCC) 02/05/2018   Essential hypertension, benign 02/05/2018   Class 3 severe obesity due to excess calories with serious comorbidity and body mass index (BMI) of 45.0 to 49.9 in adult (HCC) 02/05/2018   Vitamin D  deficiency disease 10/23/2016   Foot pain, bilateral 02/08/2016   Obesity, Class III, BMI 40-49.9 (morbid obesity) (HCC) 02/08/2016   Past Medical History:  Past Medical History:  Diagnosis Date   Borderline diabetes    Hypertension    Past Surgical History:  Past Surgical History:  Procedure Laterality Date   ABDOMINAL HYSTERECTOMY     CESAREAN SECTION     SHOULDER SURGERY     HPI:  Patient is a 64 y.o.  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   Assessment / Plan / Recommendation Clinical Impression  Patient was evaluated via the Cognistat and informal measures to assess cognitive linguistic skills. Patient  with strengths in orientation, expressive/receptive language, attention and awareness. Patient with mild deficits in problem solving and delayed recall. Patient presents with mild-moderate dysarthria characterized by imprecise articulation likely due to facial/oral asymmetry and weakness. Subsequently, patient is approx 85% intelligible at the conversational level. No further ST needs at the acute care venue. Patient would benefit from inpatient rehabilitation >3 hours a day to address aforemented deficits.    SLP Assessment  SLP Recommendation/Assessment: All further Speech Language Pathology needs can be addressed in the next venue of care SLP Visit Diagnosis: Dysarthria and anarthria (R47.1)     Assistance Recommended at Discharge  Intermittent Supervision/Assistance  Functional Status Assessment Patient has had a recent decline in their functional status and demonstrates the ability to make significant improvements in function in a reasonable and predictable amount of time.     SLP Evaluation Cognition  Overall Cognitive Status: Impaired/Different from baseline Arousal/Alertness: Awake/alert Orientation Level: Oriented X4 Year: 2026 Month: January Day of Week: Correct Attention: Focused;Sustained Focused Attention: Appears intact Sustained Attention: Appears intact Memory: Impaired Memory Impairment: Decreased short term memory Decreased Short Term Memory: Verbal basic Awareness: Appears intact Problem Solving: Impaired Problem Solving Impairment: Verbal complex Safety/Judgment: Appears intact       Comprehension  Auditory Comprehension Overall Auditory Comprehension: Appears within functional limits for tasks assessed Yes/No Questions: Within Functional Limits Commands: Within Functional Limits    Expression Expression Primary Mode of Expression: Verbal Verbal Expression Overall Verbal Expression: Appears within functional limits for tasks assessed Initiation: No  impairment Repetition: No impairment Naming: No impairment   Oral / Motor  Oral Motor/Sensory Function  Overall Oral Motor/Sensory Function: Moderate impairment Facial ROM: Reduced right Facial Symmetry: Abnormal symmetry right Facial Strength: Reduced right Facial Sensation: Within Functional Limits Lingual ROM: Reduced right Lingual Symmetry: Within Functional Limits Velum: Within Functional Limits Mandible: Within Functional Limits Motor Speech Overall Motor Speech: Impaired Respiration: Within functional limits Phonation: Low vocal intensity Resonance: Within functional limits Articulation: Impaired Level of Impairment: Phrase Intelligibility: Intelligibility reduced Phrase: 75-100% accurate Sentence: 75-100% accurate Motor Planning: Within functional limits            Kaybree Williams M.A., CCC-SLP 01/30/2024, 11:24 AM

## 2024-01-30 NOTE — Consult Note (Addendum)
 STROKE TEAM PROGRESS NOTE     64 y.o. female with history of HTN, DM, HLD,Obesity, OSA who presented to [facility] complaining of altered mental status and weakness, starting in right arm which started on 12/31 around 5 in the afternoon.   Code stroke was activated by EDP.  Evaluated emergently by teleneurology.  CT CTA negative.  Outside of window for TNK treatment.  MRI shows acute infarct. Transferred to [facility].   INTERIM HISTORY/SUBJECTIVE  Family at bedside.  ECHO at bedside.   On exam, patient continues to have dense right hemiplegia, right facial droop, mild dysarthria that patient states is improved since yesterday. MRI scan shows a large 2.5 cm left subcortical infarct.  CT angiogram shows no large vessel stenosis or occlusion. Patient denies any prior history of DVT, pulmonary embolism, recurrent miscarriages, stroke or TIA.  There is family or stroke in her brother OBJECTIVE  CBC    Component Value Date/Time   WBC 7.3 01/30/2024 0446   RBC 6.10 (H) 01/30/2024 0446   HGB 13.8 01/30/2024 0446   HGB 13.5 12/30/2023 1143   HCT 41.7 01/30/2024 0446   HCT 44.9 12/30/2023 1143   PLT 281 01/30/2024 0446   PLT 304 12/30/2023 1143   MCV 68.4 (L) 01/30/2024 0446   MCV 73 (L) 12/30/2023 1143   MCH 22.6 (L) 01/30/2024 0446   MCHC 33.1 01/30/2024 0446   RDW 18.6 (H) 01/30/2024 0446   RDW 17.3 (H) 12/30/2023 1143   LYMPHSABS 4.4 (H) 01/28/2024 2253   MONOABS 0.5 01/28/2024 2253   EOSABS 0.1 01/28/2024 2253   BASOSABS 0.0 01/28/2024 2253    BMET    Component Value Date/Time   NA 137 01/30/2024 0446   NA 138 12/30/2023 1143   K 3.6 01/30/2024 0446   CL 100 01/30/2024 0446   CO2 23 01/30/2024 0446   GLUCOSE 141 (H) 01/30/2024 0446   BUN 11 01/30/2024 0446   BUN 13 12/30/2023 1143   CREATININE 0.74 01/30/2024 0446   CALCIUM  9.4 01/30/2024 0446   EGFR 85 12/30/2023 1143   GFRNONAA >60 01/30/2024 0446    IMAGING past 24 hours ECHOCARDIOGRAM COMPLETE Result Date:  01/30/2024    ECHOCARDIOGRAM REPORT   Patient Name:   Marisa [name] Date of Exam: 01/30/2024 Medical Rec #:  [id number]            Height:       60.0 in Accession #:    [id number]           Weight:       245.0 lb Date of Birth:  1960-03-18           BSA:          2.035 m Patient Age:    63 years             BP:           167/92 mmHg Patient Gender: F                    HR:           71 bpm. Exam Location:  Inpatient Procedure: 2D Echo, Cardiac Doppler and Color Doppler (Both Spectral and Color            Flow Doppler were utilized during procedure). Indications:    Stroke and TIA  History:        Patient has prior history of Echocardiogram examinations, most  recent 04/16/2022. Risk Factors:Hypertension and Diabetes.  Sonographer:    Sherlean Dubin Referring Phys: [id number] AMY N COX  Sonographer Comments: Image acquisition challenging due to patient body habitus and Image acquisition challenging due to respiratory motion. IMPRESSIONS  1. Left ventricular ejection fraction, by estimation, is 65 to 70%. The left ventricle has normal function. The left ventricle has no regional wall motion abnormalities. There is mild concentric left ventricular hypertrophy. Left ventricular diastolic parameters are consistent with Grade I diastolic dysfunction (impaired relaxation).  2. Right ventricular systolic function is normal. The right ventricular size is normal.  3. The mitral valve is normal in structure. No evidence of mitral valve regurgitation. No evidence of mitral stenosis.  4. The aortic valve is normal in structure. Aortic valve regurgitation is not visualized. No aortic stenosis is present.  5. The inferior vena cava is normal in size with greater than 50% respiratory variability, suggesting right atrial pressure of 3 mmHg. FINDINGS  Left Ventricle: Left ventricular ejection fraction, by estimation, is 65 to 70%. The left ventricle has normal function. The left ventricle has no regional wall motion  abnormalities. The left ventricular internal cavity size was normal in size. There is  mild concentric left ventricular hypertrophy. Left ventricular diastolic parameters are consistent with Grade I diastolic dysfunction (impaired relaxation). Right Ventricle: The right ventricular size is normal. No increase in right ventricular wall thickness. Right ventricular systolic function is normal. Left Atrium: Left atrial size was normal in size. Right Atrium: Right atrial size was normal in size. Pericardium: There is no evidence of pericardial effusion. Mitral Valve: The mitral valve is normal in structure. No evidence of mitral valve regurgitation. No evidence of mitral valve stenosis. MV peak gradient, 3.3 mmHg. The mean mitral valve gradient is 1.0 mmHg. Tricuspid Valve: The tricuspid valve is normal in structure. Tricuspid valve regurgitation is trivial. No evidence of tricuspid stenosis. Aortic Valve: The aortic valve is normal in structure. Aortic valve regurgitation is not visualized. No aortic stenosis is present. Aortic valve mean gradient measures 3.5 mmHg. Aortic valve peak gradient measures 6.2 mmHg. Aortic valve area, by VTI measures 2.20 cm. Pulmonic Valve: The pulmonic valve was normal in structure. Pulmonic valve regurgitation is not visualized. No evidence of pulmonic stenosis. Aorta: The aortic root is normal in size and structure. Venous: The inferior vena cava is normal in size with greater than 50% respiratory variability, suggesting right atrial pressure of 3 mmHg. IAS/Shunts: No atrial level shunt detected by color flow Doppler.  LEFT VENTRICLE PLAX 2D LVIDd:         4.70 cm   Diastology LVIDs:         3.50 cm   LV e' medial:    4.20 cm/s LV PW:         1.10 cm   LV E/e' medial:  12.9 LV IVS:        1.00 cm   LV e' lateral:   5.44 cm/s LVOT diam:     2.00 cm   LV E/e' lateral: 9.9 LV SV:         58 LV SV Index:   29 LVOT Area:     3.14 cm  RIGHT VENTRICLE             IVC RV Basal diam:  3.60 cm      IVC diam: 1.30 cm RV Mid diam:    3.30 cm RV S prime:     13.80 cm/s TAPSE (M-mode): 2.1 cm LEFT ATRIUM  Index        RIGHT ATRIUM           Index LA diam:      2.50 cm 1.23 cm/m   RA Area:     16.10 cm LA Vol (A2C): 53.1 ml 26.10 ml/m  RA Volume:   39.40 ml  19.36 ml/m LA Vol (A4C): 43.5 ml 21.38 ml/m  AORTIC VALVE AV Area (Vmax):    2.45 cm AV Area (Vmean):   2.29 cm AV Area (VTI):     2.20 cm AV Vmax:           124.50 cm/s AV Vmean:          84.500 cm/s AV VTI:            0.265 m AV Peak Grad:      6.2 mmHg AV Mean Grad:      3.5 mmHg LVOT Vmax:         97.25 cm/s LVOT Vmean:        61.700 cm/s LVOT VTI:          0.186 m LVOT/AV VTI ratio: 0.70  AORTA Ao Root diam: 2.70 cm Ao Asc diam:  3.20 cm MITRAL VALVE               TRICUSPID VALVE MV Area (PHT): 2.56 cm    TR Peak grad:   9.1 mmHg MV Area VTI:   2.58 cm    TR Vmax:        151.00 cm/s MV Peak grad:  3.3 mmHg MV Mean grad:  1.0 mmHg    SHUNTS MV Vmax:       0.91 m/s    Systemic VTI:  0.19 m MV Vmean:      52.6 cm/s   Systemic Diam: 2.00 cm MV Decel Time: 296 msec MR Peak grad: 6.4 mmHg MR Vmax:      126.00 cm/s MV E velocity: 54.00 cm/s MV A velocity: 78.40 cm/s MV E/A ratio:  0.69 Marisa [name] Electronically signed by Marisa [name] Signature Date/Time: 01/30/2024/11:10:46 AM    Final    MR BRAIN WO CONTRAST Result Date: 01/29/2024 EXAM: MRI BRAIN WITHOUT CONTRAST 01/29/2024 05:44:05 PM TECHNIQUE: Multiplanar multisequence MRI of the head/brain was performed without the administration of intravenous contrast. COMPARISON: CT head and CTA head and neck 01/28/2024. CLINICAL HISTORY: Neuro deficit, acute, stroke suspected. FINDINGS: BRAIN AND VENTRICLES: There is a 2.9 x 1.5 cm focus of restricted diffusion centered in the left basal ganglia compatible with acute infarct. There is mild associated edema. No significant mass effect on the ventricles. No midline shift. No hydrocephalus. No intracranial hemorrhage. No mass. T2 and FLAIR  hyperintensity throughout the periventricular and subcortical white matter suggestive of mild to moderate chronic microvascular ischemic changes. Cerebral volume within normal limits for patients age. The sella is unremarkable. Normal flow voids. ORBITS: No acute abnormality. SINUSES AND MASTOIDS: No acute abnormality. BONES AND SOFT TISSUES: Normal marrow signal. No acute soft tissue abnormality. IMPRESSION: 1. Acute infarct in the left basal ganglia with mild associated edema. No midline shift. Electronically signed by: Marisa Mania MD 01/29/2024 06:13 PM EST RP Workstation: [id number]    Vitals:   01/29/24 2353 01/30/24 0344 01/30/24 0800 01/30/24 1143  BP: (!) 181/95 (!) 148/73 (!) 167/92 (!) 185/92  Pulse: 78 74 68 71  Resp: 16  15 20   Temp: 98.3 F (36.8 C) 97.8 F (36.6 C) 97.9 F (36.6 C) 98.4 F (36.9 C)  TempSrc: Oral  Oral Oral Oral  SpO2:      Weight:      Height:        PHYSICAL EXAM General:  Alert, well-nourished, well-developed patient  CV: Regular rate and rhythm on monitor Respiratory:  Regular, unlabored respirations on room air  NEURO:  Mental Status: AA&Ox3, patient is able to give clear and coherent history Speech/Language: speech is without aphasia.  Naming, repetition, fluency, and comprehension intact.  Cranial Nerves:  II: PERRL. Visual fields full.  III, IV, VI: EOMI. Eyelids elevate symmetrically.  V: Sensation is intact to light touch and symmetrical to face.  VII: Severe right facial droop VIII: hearing intact to voice. IX, X: Palate elevates symmetrically. Mild dysarthria, improving.   KP:Dynloizm shrug 5/5. XII: tongue is midline without fasciculations. Motor:  Dense right hemiplegia RUE: No spontaneous movement, no grip strength RLE: Slight movement Tone: is normal and bulk is normal Sensation- Intact to light touch bilaterally. Extinction absent to light touch to DSS.   Coordination: FTN unable to perform on right.  Gait- deferred  Most  Recent NIH 11  Baseline modified Rankin score 0  ASSESSMENT/PLAN  Marisa [name] is a 63 y.o. female with history of HTN, DM, HLD,Obesity, who presented to [facility] complaining of altered mental status and weakness, starting in right arm.  Code stroke was activated by EDP.  Evaluated emergently by teleneurology.  CT CTA negative.  Outside of window for TNK treatment.  MRI shows acute infarct.  Admitted for stroke workup.  NIH per tele-neurology 3. Per chart review, patients NIH increased while at [facility] ED prior to transfer to 8, with no interventions ordered. On arrival to [facility] was 12, noted as 10 and 11 on subsequent exams.   Acute Ischemic Infarct:  left basal ganglia  Etiology: Likely cryptogenic given large size of the subcortical infarct in patient with multiple uncontrolled stroke risk factors.   Code Stroke CT head  No acute intracranial abnormality. ASPECTS score is 10. CTA head & neck  No large vessel occlusion, hemodynamically significant stenosis, or aneurysm in the head or neck. Enlarged thyroid gland with 2.2 cm hypodense nodule in the left lobe; recommend non-emergent thyroid ultrasound. 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 Therapy recommendations:  CIR Disposition:  pending  Hypertension Home meds: Amlodipine  10 mg daily Bystolic  10 mg daily Stable Blood Pressure Goal: BP less than 220/110   Hyperlipidemia Home meds: Lipitor 40 mg LDL 145, goal < 70 Increase to Lipitor 80 mg Continue statin at discharge  Diabetes type II Uncontrolled Home meds: Metformin  1000 mg twice daily HgbA1c 7.2, goal < 7.0 CBGs, SSI Recommend close follow-up with PCP for better DM  control  Other Stroke Risk Factors Obesity, Body mass index is 47.85 kg/m., BMI >/= 30 associated with increased stroke risk, recommend weight loss, diet and exercise as appropriate  Obstructive sleep apnea, on CPAP at home  Hospital day # 0  Pt seen by Neuro NP/APP with MD. Note/plan to be edited by MD as needed.    Rocky JAYSON Likes, DNP [facility] Please use AMION for contact information & EPIC for messaging.  I have personally obtained history,examined this patient, reviewed  notes, independently viewed imaging studies, participated in medical decision making and plan of care.ROS completed by me personally and pertinent positives fully documented  I have made any additions or clarifications directly to the above note. Agree with note above.  Patient presented with sudden onset of right hemiparesis and altered mental status due to large left subcortical infarct.  Etiology to be determined.  Continue ongoing stroke workup.  Check 2D echo, bubble study and will likely need TEE.  Heart monitor paroxysmal A-fib.  Aspirin and Plavix for 3 weeks followed by Plavix alone and aggressive risk factor modification.  She also appears to be at risk for sleep apnea and will need outpatient polysomnogram.  Long discussion with patient and her daughter and other family members at the bedside and answered questions.   I personally spent a total of 50 minutes in the care of the patient today including getting/reviewing separately obtained history, performing a medically appropriate exam/evaluation, counseling and educating, placing orders, referring and communicating with other health care professionals, documenting clinical information in the EHR, independently interpreting results, and coordinating care.        Eather Popp, MD Medical Director [facility] Pager: [phone] 01/30/2024 5:01 PM   To contact Stroke Continuity provider, please refer to [email or web address]. After hours, contact  General Neurology

## 2024-01-30 NOTE — Evaluation (Signed)
 Clinical/Bedside Swallow Evaluation Patient Details  Name: Marisa [name] MRN: [id number] Date of Birth: [birth year]  Today's Date: 01/30/2024 Time: SLP Start Time (ACUTE ONLY): 1100 SLP Stop Time (ACUTE ONLY): 1115 SLP Time Calculation (min) (ACUTE ONLY): 15 min  Past Medical History:  Past Medical History:  Diagnosis Date   Borderline diabetes    Hypertension    Past Surgical History:  Past Surgical History:  Procedure Laterality Date   ABDOMINAL HYSTERECTOMY     CESAREAN SECTION     SHOULDER SURGERY     HPI:  Patient is a 64 y.o.  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    Assessment / Plan / Recommendation  Clinical Impression  A bedside swallow evaluation was completed to assess for s/sx of oropharyngeal dysphagia. Oral mechanism exam revealed R facial asymmetry/weakness and lingual weakness/reduced ROM. POs administered included thin liquids via cup/straw, puree and solids. Patient with timely mastication and mild R buccal residuals cleared through lingual sweep. Patient with x1 cough after PO was d/c, though no s/sx of aspiration during PO consumption despite challenging trials. Recommend regular/thin diet with use of standardized precautions including sitting upright during PO, taking small bites/sips at a slow rate and lingual sweep to clear pocketing.  SLP Visit Diagnosis: Dysarthria and anarthria (R47.1);Dysphagia, oral phase (R13.11)    Aspiration Risk  No limitations    Diet Recommendation Regular;Thin liquid    Liquid Administration via: Cup;Straw Medication Administration: Whole meds with liquid Supervision: Staff to assist with self feeding Compensations: Slow rate;Small sips/bites;Lingual sweep for clearance of pocketing Postural Changes: Seated upright at 90 degrees    Other Recommendations Oral Care Recommendations: Oral care BID      Assistance Recommended at Discharge  Intermittent Supervision/Assistance  Functional Status Assessment Patient has had a recent decline in their functional status and demonstrates the ability to make significant improvements in function in a reasonable and predictable amount of time.  Frequency and Duration min 2x/week  2 weeks       Prognosis Prognosis for improved oropharyngeal function: Good      Swallow Study   General HPI: Patient is a 64 y.o.  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 Type of Study: Bedside Swallow Evaluation Previous Swallow Assessment: none Diet Prior to this Study: Regular;Thin liquids (Level 0) Respiratory Status: Room air Behavior/Cognition: Alert;Cooperative;Pleasant mood Oral Care Completed by SLP: No Oral Cavity - Dentition: Missing dentition Vision: Functional for self-feeding Self-Feeding Abilities: Needs assist Patient Positioning: Upright in bed Volitional Cough: Weak Volitional Swallow: Able to elicit    Oral/Motor/Sensory Function Overall Oral Motor/Sensory Function: Moderate impairment Facial ROM: Reduced right Facial Symmetry: Abnormal symmetry right Facial Strength: Reduced right Facial Sensation: Within Functional Limits Lingual ROM: Reduced right Lingual Symmetry: Within Functional Limits Lingual Strength: Reduced Velum: Within Functional Limits Mandible: Within Functional Limits   Ice Chips Ice chips: Not tested   Thin Liquid Thin Liquid: Within functional limits Presentation: Self Fed;Straw;Cup    Nectar Thick Nectar Thick Liquid: Not tested   Honey Thick Honey Thick Liquid: Not tested   Puree Puree: Within functional limits Presentation: Self Fed;Spoon   Solid     Solid: Within functional limits Presentation: Self Fed      Cinthya Bors M.A., CCC-SLP 01/30/2024,11:28 AM

## 2024-01-30 NOTE — Progress Notes (Signed)
  Inpatient Rehab Admissions Coordinator :  Per therapy recommendations, patient was screened for CIR candidacy by Ottie Glazier RN MSN.  At this time patient appears to be a potential candidate for CIR. I will place a rehab consult per protocol for full assessment. Please call me with any questions.  Ottie Glazier RN MSN Admissions Coordinator [phone]

## 2024-01-30 NOTE — Evaluation (Signed)
 Physical Therapy Evaluation Patient Details Name: Marisa [name] MRN: [id number] DOB: Mar 25, 1960 Today's Date: 01/30/2024  History of Present Illness  64 y.o female presents to [facility] on 12/31 with R sided weakness and slurred speech. CT (-). 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 PMH: HTN, DM2, HLD.  Clinical Impression  Pt is currently mobilizing below her baseline due to R sided hemiplegia s/p L basal ganglia CVA. Pt reports normal sensation in R UE and LE but is unable to demonstrate any intentional muscle activation when provided cues. Pt requires min-modA for bed mobility due to assist needed to manage R LE. Pt with forward trunk lean initially when sitting EOB but demonstrates good postural control. Squat pivot performed to transfer to chair. Pt does well pushing with L UE and attempting to stand with L LE in order to initiate transfer. Pt attempts to scoot hips back in chair without assist. Pt would benefit from continued PT services focused on bed mobility, ROM, strength, and transfers to progress functional mobility. Pt appropriate for 3hrs rehab upon discharge in order to optimize functional gains.         If plan is discharge home, recommend the following: Two people to help with walking and/or transfers;Two people to help with bathing/dressing/bathroom;Assistance with cooking/housework;Assist for transportation;Help with stairs or ramp for entrance;Assistance with feeding   Can travel by private vehicle        Equipment Recommendations Rolling walker (2 wheels);BSC/3in1;Wheelchair (measurements PT);Wheelchair cushion (measurements PT)  Recommendations for Other Services       Functional Status Assessment Patient has had a recent decline in their functional status and demonstrates the ability to make significant improvements in function in a reasonable and predictable amount of time.     Precautions / Restrictions Precautions Precautions: Fall Recall of  Precautions/Restrictions: Intact Precaution/Restrictions Comments: Note flaccid R UE Restrictions Weight Bearing Restrictions Per Provider Order: No      Mobility  Bed Mobility Overal bed mobility: Needs Assistance Bed Mobility: Supine to Sit     Supine to sit: Min assist, Mod assist     General bed mobility comments: Assist to reposition while EOB, pt does well reaching and pulling with L UE to bring trunk upright. MaxA required to manage R LE, pt mobilizies L LE independently.    Transfers Overall transfer level: Needs assistance Equipment used: None Transfers: Bed to chair/wheelchair/BSC       Squat pivot transfers: Mod assist, Max assist     General transfer comment: Scoots EOB with verbals cues and MinA, squat pivot performed to chair, pt does well pushing with L UE and attempting to stand with L LE to clear hips from seated surface. Assist required to pivot hips to the degree needed to reach the chair. Squat pivot x3 completed to complete full transfer.    Ambulation/Gait                  Stairs            Wheelchair Mobility     Tilt Bed    Modified Rankin (Stroke Patients Only)       Balance Overall balance assessment: Needs assistance Sitting-balance support: Feet supported Sitting balance-Leahy Scale: Fair Sitting balance - Comments: Pt demonstrates improved seated balance with feet supported. Pt initially leaning forward while sitting EOB, improves with light cues. Assist to support R UE in sitting.  Pertinent Vitals/Pain Pain Assessment Pain Assessment: No/denies pain    Home Living Family/patient expects to be discharged to:: Private residence Living Arrangements: Spouse/significant other Available Help at Discharge: Family;Available 24 hours/day Type of Home: House Home Access: Stairs to enter Entrance Stairs-Rails: Right Entrance Stairs-Number of Steps: 2   Home Layout: One  level;Laundry or work area in [employer] Equipment: Grab bars - tub/shower      Prior Function Prior Level of Function : Independent/Modified Independent;Driving                     Extremity/Trunk Assessment   Upper Extremity Assessment Upper Extremity Assessment: Defer to OT evaluation RUE Deficits / Details: Flaccid RUE Sensation: decreased light touch;decreased proprioception RUE Coordination: decreased fine motor;decreased gross motor    Lower Extremity Assessment Lower Extremity Assessment: RLE deficits/detail;LLE deficits/detail RLE Deficits / Details: No muslce activation noted in L LE despite verbal and tactile cues. Pt reports sensation WNL to light touch. RLE Sensation: WNL LLE Deficits / Details: LLE strength WFL LLE Sensation: WNL LLE Coordination: WNL    Cervical / Trunk Assessment Cervical / Trunk Assessment: Normal  Communication   Communication Communication: Impaired Factors Affecting Communication: Difficulty expressing self;Reduced clarity of speech    Cognition Arousal: Alert Behavior During Therapy: WFL for tasks assessed/performed   PT - Cognitive impairments: No apparent impairments                         Following commands: Intact       Cueing Cueing Techniques: Verbal cues, Tactile cues, Visual cues     General Comments General comments (skin integrity, edema, etc.): VSS throughout. No significant skin abnormalities noted.    Exercises     Assessment/Plan    PT Assessment Patient needs continued PT services  PT Problem List Decreased strength;Decreased mobility;Decreased safety awareness;Impaired tone;Decreased range of motion;Decreased coordination;Decreased knowledge of precautions;Decreased activity tolerance;Decreased balance;Decreased knowledge of use of DME       PT Treatment Interventions DME instruction;Therapeutic exercise;Gait training;Balance training;Stair training;Neuromuscular  re-education;Wheelchair mobility training;Functional mobility training;Therapeutic activities;Patient/family education    PT Goals (Current goals can be found in the Care Plan section)  Acute Rehab PT Goals Patient Stated Goal: No additional goals stated    Frequency Min 3X/week     Co-evaluation               AM-PAC PT 6 Clicks Mobility  Outcome Measure Help needed turning from your back to your side while in a flat bed without using bedrails?: A Little Help needed moving from lying on your back to sitting on the side of a flat bed without using bedrails?: A Little Help needed moving to and from a bed to a chair (including a wheelchair)?: A Lot Help needed standing up from a chair using your arms (e.g., wheelchair or bedside chair)?: Total Help needed to walk in hospital room?: Total Help needed climbing 3-5 steps with a railing? : Total 6 Click Score: 11    End of Session Equipment Utilized During Treatment: Gait belt Activity Tolerance: Patient tolerated treatment well Patient left: in chair;with call bell/phone within reach;with family/visitor present (Hosptial chair alarms out of stock) Nurse Communication: Mobility status PT Visit Diagnosis: Other abnormalities of gait and mobility (R26.89);Muscle weakness (generalized) (M62.81);Hemiplegia and hemiparesis Hemiplegia - Right/Left: Right Hemiplegia - dominant/non-dominant: Dominant Hemiplegia - caused by: Cerebral infarction    Time: 1115-1135 PT Time Calculation (min) (ACUTE ONLY): 20 min   Charges:  PT Evaluation $PT Eval High Complexity: 1 High   PT General Charges $$ ACUTE PT VISIT: 1 Visit         Sabra Morel, PT, DPT  Acute Rehabilitation Services         Office: [phone]     Sabra MARLA Morel 01/30/2024, 12:52 PM

## 2024-01-30 NOTE — Evaluation (Signed)
 Occupational Therapy Evaluation Patient Details Name: Marisa [name] MRN: [id number] DOB: Dec 15, 1960 Today's Date: 01/30/2024   History of Present Illness   64 year old female with history of hypertension, non-insulin-dependent diabetes mellitus, hyperlipidemia.  01/28/2024: Patient presented to the ED for chief concerns of left upper and lower extremity weakness.  MR Brain:  Acute infarct in the left basal ganglia with mild associated edema. No  midline shift.     Clinical Impressions Patient admitted for the diagnosis above.  PTA she lives at home with her spouse, who is able to provide 24 hour assist.  At baseline she remained independent with mobility, ADL, iADL.  Presents with deficits listed below, needing up to Max A for lower body ADL, up to Mod A for basic mobility.  Patient demonstrates ability to participate in a more aggressive rehab.  OT will continue efforts in the acute setting to address deficits.  Patient will benefit from intensive inpatient follow-up therapy, >3 hours/day.     If plan is discharge home, recommend the following:   A lot of help with walking and/or transfers;A lot of help with bathing/dressing/bathroom;Assist for transportation;Assistance with cooking/housework     Functional Status Assessment   Patient has had a recent decline in their functional status and demonstrates the ability to make significant improvements in function in a reasonable and predictable amount of time.     Equipment Recommendations   Tub/shower seat;Wheelchair cushion (measurements OT);Wheelchair (measurements OT);BSC/3in1     Recommendations for Other Services   Rehab consult     Precautions/Restrictions   Precautions Precautions: Fall Recall of Precautions/Restrictions: Intact Restrictions Weight Bearing Restrictions Per Provider Order: No     Mobility Bed Mobility Overal bed mobility: Needs Assistance Bed Mobility: Supine to Sit, Sit to Supine      Supine to sit: Mod assist Sit to supine: Max assist        Transfers Overall transfer level: Needs assistance                 General transfer comment: Able to lateral scoot with vC's and Mod A      Balance Overall balance assessment: Needs assistance Sitting-balance support: Feet supported Sitting balance-Leahy Scale: Fair   Postural control: Posterior lean                                 ADL either performed or assessed with clinical judgement   ADL Overall ADL's : Needs assistance/impaired Eating/Feeding: Minimal assistance;Bed level   Grooming: Set up;Bed level   Upper Body Bathing: Moderate assistance;Sitting   Lower Body Bathing: Maximal assistance;Bed level   Upper Body Dressing : Moderate assistance;Sitting   Lower Body Dressing: Maximal assistance;Bed level   Toilet Transfer: Moderate assistance;+2 for physical assistance;Maximal assistance;BSC/3in1   Toileting- Clothing Manipulation and Hygiene: Maximal assistance;Bed level               Vision Patient Visual Report: No change from baseline       Perception Perception: Within Functional Limits       Praxis Praxis: WFL       Pertinent Vitals/Pain Pain Assessment Pain Assessment: No/denies pain     Extremity/Trunk Assessment Upper Extremity Assessment Upper Extremity Assessment: Right hand dominant;RUE deficits/detail RUE Deficits / Details: Flaccid RUE Sensation: decreased light touch;decreased proprioception RUE Coordination: decreased fine motor;decreased gross motor   Lower Extremity Assessment Lower Extremity Assessment: Defer to PT evaluation   Cervical / Trunk Assessment  Cervical / Trunk Assessment: Normal   Communication Communication Communication: Impaired Factors Affecting Communication: Reduced clarity of speech;Difficulty expressing self   Cognition Arousal: Alert Behavior During Therapy: Lability Cognition: No apparent impairments                                Following commands: Intact       Cueing  General Comments   Cueing Techniques: Verbal cues   VSS on RA.  No spikes in BP noted.     Exercises     Shoulder Instructions      Home Living Family/patient expects to be discharged to:: Private residence Living Arrangements: Spouse/significant other Available Help at Discharge: Family;Available 24 hours/day Type of Home: House Home Access: Stairs to enter [employer] of Steps: 2 Entrance Stairs-Rails: Right Home Layout: One level;Laundry or work area in basement     [employer]: Producer, Television/film/video: Pharmacist, Community: Yes How Accessible: Accessible via walker Home Equipment: Grab bars - tub/shower          Prior Functioning/Environment Prior Level of Function : Independent/Modified Independent;Driving                    OT Problem List: Decreased strength;Decreased range of motion;Impaired balance (sitting and/or standing);Impaired sensation;Impaired UE functional use;Decreased coordination   OT Treatment/Interventions: Self-care/ADL training;Balance training;Neuromuscular education;Therapeutic activities;DME and/or AE instruction;Patient/family education      OT Goals(Current goals can be found in the care plan section)   Acute Rehab OT Goals Patient Stated Goal: (P) Get better and go home OT Goal Formulation: (P) With patient Time For Goal Achievement: (P) 02/13/24 Potential to Achieve Goals: (P) Good ADL Goals Pt Will Perform Grooming: with set-up;sitting Pt Will Perform Upper Body Dressing: with supervision;sitting Pt Will Perform Lower Body Dressing: with min assist;sitting/lateral leans Pt Will Transfer to Toilet: with min assist;squat pivot transfer;bedside commode Pt/caregiver will Perform Home Exercise Program: Increased strength;Right Upper extremity;With minimal assist   OT Frequency:  Min 2X/week     Co-evaluation              AM-PAC OT 6 Clicks Daily Activity     Outcome Measure Help from another person eating meals?: A Little Help from another person taking care of personal grooming?: A Little Help from another person toileting, which includes using toliet, bedpan, or urinal?: A Lot Help from another person bathing (including washing, rinsing, drying)?: A Lot Help from another person to put on and taking off regular upper body clothing?: A Lot Help from another person to put on and taking off regular lower body clothing?: A Lot 6 Click Score: 14   End of Session Nurse Communication: Mobility status  Activity Tolerance: Patient tolerated treatment well Patient left: in bed;with call bell/phone within reach;with family/visitor present  OT Visit Diagnosis: Unsteadiness on feet (R26.81);Muscle weakness (generalized) (M62.81);Hemiplegia and hemiparesis Hemiplegia - Right/Left: Right Hemiplegia - dominant/non-dominant: Dominant Hemiplegia - caused by: Cerebral infarction                Time: 9144-9080 OT Time Calculation (min): 24 min Charges:  OT General Charges $OT Visit: 1 Visit OT Evaluation $OT Eval Moderate Complexity: 1 Mod OT Treatments $Therapeutic Activity: 8-22 mins  01/30/2024  RP, OTR/L  Acute Rehabilitation Services  Office:  [phone]   Marisa [name] 01/30/2024, 10:05 AM

## 2024-01-30 NOTE — H&P (View-Only) (Signed)
 STROKE TEAM PROGRESS NOTE     64 y.o. female with history of HTN, DM, HLD,Obesity, OSA who presented to [facility] complaining of altered mental status and weakness, starting in right arm which started on 12/31 around 5 in the afternoon.   Code stroke was activated by EDP.  Evaluated emergently by teleneurology.  CT CTA negative.  Outside of window for TNK treatment.  MRI shows acute infarct. Transferred to [facility].   INTERIM HISTORY/SUBJECTIVE  Family at bedside.  ECHO at bedside.   On exam, patient continues to have dense right hemiplegia, right facial droop, mild dysarthria that patient states is improved since yesterday. MRI scan shows a large 2.5 cm left subcortical infarct.  CT angiogram shows no large vessel stenosis or occlusion. Patient denies any prior history of DVT, pulmonary embolism, recurrent miscarriages, stroke or TIA.  There is family or stroke in her brother OBJECTIVE  CBC    Component Value Date/Time   WBC 7.3 01/30/2024 0446   RBC 6.10 (H) 01/30/2024 0446   HGB 13.8 01/30/2024 0446   HGB 13.5 12/30/2023 1143   HCT 41.7 01/30/2024 0446   HCT 44.9 12/30/2023 1143   PLT 281 01/30/2024 0446   PLT 304 12/30/2023 1143   MCV 68.4 (L) 01/30/2024 0446   MCV 73 (L) 12/30/2023 1143   MCH 22.6 (L) 01/30/2024 0446   MCHC 33.1 01/30/2024 0446   RDW 18.6 (H) 01/30/2024 0446   RDW 17.3 (H) 12/30/2023 1143   LYMPHSABS 4.4 (H) 01/28/2024 2253   MONOABS 0.5 01/28/2024 2253   EOSABS 0.1 01/28/2024 2253   BASOSABS 0.0 01/28/2024 2253    BMET    Component Value Date/Time   NA 137 01/30/2024 0446   NA 138 12/30/2023 1143   K 3.6 01/30/2024 0446   CL 100 01/30/2024 0446   CO2 23 01/30/2024 0446   GLUCOSE 141 (H) 01/30/2024 0446   BUN 11 01/30/2024 0446   BUN 13 12/30/2023 1143   CREATININE 0.74 01/30/2024 0446   CALCIUM  9.4 01/30/2024 0446   EGFR 85 12/30/2023 1143   GFRNONAA >60 01/30/2024 0446    IMAGING past 24 hours ECHOCARDIOGRAM COMPLETE Result Date:  01/30/2024    ECHOCARDIOGRAM REPORT   Patient Name:   KAMEKO HUKILL Schedler Date of Exam: 01/30/2024 Medical Rec #:  [id number]            Height:       60.0 in Accession #:    [id number]           Weight:       245.0 lb Date of Birth:  1960-03-18           BSA:          2.035 m Patient Age:    63 years             BP:           167/92 mmHg Patient Gender: F                    HR:           71 bpm. Exam Location:  Inpatient Procedure: 2D Echo, Cardiac Doppler and Color Doppler (Both Spectral and Color            Flow Doppler were utilized during procedure). Indications:    Stroke and TIA  History:        Patient has prior history of Echocardiogram examinations, most  recent 04/16/2022. Risk Factors:Hypertension and Diabetes.  Sonographer:    Sherlean Dubin Referring Phys: [id number] AMY N COX  Sonographer Comments: Image acquisition challenging due to patient body habitus and Image acquisition challenging due to respiratory motion. IMPRESSIONS  1. Left ventricular ejection fraction, by estimation, is 65 to 70%. The left ventricle has normal function. The left ventricle has no regional wall motion abnormalities. There is mild concentric left ventricular hypertrophy. Left ventricular diastolic parameters are consistent with Grade I diastolic dysfunction (impaired relaxation).  2. Right ventricular systolic function is normal. The right ventricular size is normal.  3. The mitral valve is normal in structure. No evidence of mitral valve regurgitation. No evidence of mitral stenosis.  4. The aortic valve is normal in structure. Aortic valve regurgitation is not visualized. No aortic stenosis is present.  5. The inferior vena cava is normal in size with greater than 50% respiratory variability, suggesting right atrial pressure of 3 mmHg. FINDINGS  Left Ventricle: Left ventricular ejection fraction, by estimation, is 65 to 70%. The left ventricle has normal function. The left ventricle has no regional wall motion  abnormalities. The left ventricular internal cavity size was normal in size. There is  mild concentric left ventricular hypertrophy. Left ventricular diastolic parameters are consistent with Grade I diastolic dysfunction (impaired relaxation). Right Ventricle: The right ventricular size is normal. No increase in right ventricular wall thickness. Right ventricular systolic function is normal. Left Atrium: Left atrial size was normal in size. Right Atrium: Right atrial size was normal in size. Pericardium: There is no evidence of pericardial effusion. Mitral Valve: The mitral valve is normal in structure. No evidence of mitral valve regurgitation. No evidence of mitral valve stenosis. MV peak gradient, 3.3 mmHg. The mean mitral valve gradient is 1.0 mmHg. Tricuspid Valve: The tricuspid valve is normal in structure. Tricuspid valve regurgitation is trivial. No evidence of tricuspid stenosis. Aortic Valve: The aortic valve is normal in structure. Aortic valve regurgitation is not visualized. No aortic stenosis is present. Aortic valve mean gradient measures 3.5 mmHg. Aortic valve peak gradient measures 6.2 mmHg. Aortic valve area, by VTI measures 2.20 cm. Pulmonic Valve: The pulmonic valve was normal in structure. Pulmonic valve regurgitation is not visualized. No evidence of pulmonic stenosis. Aorta: The aortic root is normal in size and structure. Venous: The inferior vena cava is normal in size with greater than 50% respiratory variability, suggesting right atrial pressure of 3 mmHg. IAS/Shunts: No atrial level shunt detected by color flow Doppler.  LEFT VENTRICLE PLAX 2D LVIDd:         4.70 cm   Diastology LVIDs:         3.50 cm   LV e' medial:    4.20 cm/s LV PW:         1.10 cm   LV E/e' medial:  12.9 LV IVS:        1.00 cm   LV e' lateral:   5.44 cm/s LVOT diam:     2.00 cm   LV E/e' lateral: 9.9 LV SV:         58 LV SV Index:   29 LVOT Area:     3.14 cm  RIGHT VENTRICLE             IVC RV Basal diam:  3.60 cm      IVC diam: 1.30 cm RV Mid diam:    3.30 cm RV S prime:     13.80 cm/s TAPSE (M-mode): 2.1 cm LEFT ATRIUM  Index        RIGHT ATRIUM           Index LA diam:      2.50 cm 1.23 cm/m   RA Area:     16.10 cm LA Vol (A2C): 53.1 ml 26.10 ml/m  RA Volume:   39.40 ml  19.36 ml/m LA Vol (A4C): 43.5 ml 21.38 ml/m  AORTIC VALVE AV Area (Vmax):    2.45 cm AV Area (Vmean):   2.29 cm AV Area (VTI):     2.20 cm AV Vmax:           124.50 cm/s AV Vmean:          84.500 cm/s AV VTI:            0.265 m AV Peak Grad:      6.2 mmHg AV Mean Grad:      3.5 mmHg LVOT Vmax:         97.25 cm/s LVOT Vmean:        61.700 cm/s LVOT VTI:          0.186 m LVOT/AV VTI ratio: 0.70  AORTA Ao Root diam: 2.70 cm Ao Asc diam:  3.20 cm MITRAL VALVE               TRICUSPID VALVE MV Area (PHT): 2.56 cm    TR Peak grad:   9.1 mmHg MV Area VTI:   2.58 cm    TR Vmax:        151.00 cm/s MV Peak grad:  3.3 mmHg MV Mean grad:  1.0 mmHg    SHUNTS MV Vmax:       0.91 m/s    Systemic VTI:  0.19 m MV Vmean:      52.6 cm/s   Systemic Diam: 2.00 cm MV Decel Time: 296 msec MR Peak grad: 6.4 mmHg MR Vmax:      126.00 cm/s MV E velocity: 54.00 cm/s MV A velocity: 78.40 cm/s MV E/A ratio:  0.69 Morene Brownie Electronically signed by Morene Brownie Signature Date/Time: 01/30/2024/11:10:46 AM    Final    MR BRAIN WO CONTRAST Result Date: 01/29/2024 EXAM: MRI BRAIN WITHOUT CONTRAST 01/29/2024 05:44:05 PM TECHNIQUE: Multiplanar multisequence MRI of the head/brain was performed without the administration of intravenous contrast. COMPARISON: CT head and CTA head and neck 01/28/2024. CLINICAL HISTORY: Neuro deficit, acute, stroke suspected. FINDINGS: BRAIN AND VENTRICLES: There is a 2.9 x 1.5 cm focus of restricted diffusion centered in the left basal ganglia compatible with acute infarct. There is mild associated edema. No significant mass effect on the ventricles. No midline shift. No hydrocephalus. No intracranial hemorrhage. No mass. T2 and FLAIR  hyperintensity throughout the periventricular and subcortical white matter suggestive of mild to moderate chronic microvascular ischemic changes. Cerebral volume within normal limits for patients age. The sella is unremarkable. Normal flow voids. ORBITS: No acute abnormality. SINUSES AND MASTOIDS: No acute abnormality. BONES AND SOFT TISSUES: Normal marrow signal. No acute soft tissue abnormality. IMPRESSION: 1. Acute infarct in the left basal ganglia with mild associated edema. No midline shift. Electronically signed by: Donnice Mania MD 01/29/2024 06:13 PM EST RP Workstation: [id number]    Vitals:   01/29/24 2353 01/30/24 0344 01/30/24 0800 01/30/24 1143  BP: (!) 181/95 (!) 148/73 (!) 167/92 (!) 185/92  Pulse: 78 74 68 71  Resp: 16  15 20   Temp: 98.3 F (36.8 C) 97.8 F (36.6 C) 97.9 F (36.6 C) 98.4 F (36.9 C)  TempSrc: Oral  Oral Oral Oral  SpO2:      Weight:      Height:        PHYSICAL EXAM General:  Alert, well-nourished, well-developed patient  CV: Regular rate and rhythm on monitor Respiratory:  Regular, unlabored respirations on room air  NEURO:  Mental Status: AA&Ox3, patient is able to give clear and coherent history Speech/Language: speech is without aphasia.  Naming, repetition, fluency, and comprehension intact.  Cranial Nerves:  II: PERRL. Visual fields full.  III, IV, VI: EOMI. Eyelids elevate symmetrically.  V: Sensation is intact to light touch and symmetrical to face.  VII: Severe right facial droop VIII: hearing intact to voice. IX, X: Palate elevates symmetrically. Mild dysarthria, improving.   KP:Dynloizm shrug 5/5. XII: tongue is midline without fasciculations. Motor:  Dense right hemiplegia RUE: No spontaneous movement, no grip strength RLE: Slight movement Tone: is normal and bulk is normal Sensation- Intact to light touch bilaterally. Extinction absent to light touch to DSS.   Coordination: FTN unable to perform on right.  Gait- deferred  Most  Recent NIH 11  Baseline modified Rankin score 0  ASSESSMENT/PLAN  Ms. SIYANA ERNEY is a 63 y.o. female with history of HTN, DM, HLD,Obesity, who presented to [facility] complaining of altered mental status and weakness, starting in right arm.  Code stroke was activated by EDP.  Evaluated emergently by teleneurology.  CT CTA negative.  Outside of window for TNK treatment.  MRI shows acute infarct.  Admitted for stroke workup.  NIH per tele-neurology 3. Per chart review, patients NIH increased while at [facility] ED prior to transfer to 8, with no interventions ordered. On arrival to [facility] was 12, noted as 10 and 11 on subsequent exams.   Acute Ischemic Infarct:  left basal ganglia  Etiology: Likely cryptogenic given large size of the subcortical infarct in patient with multiple uncontrolled stroke risk factors.   Code Stroke CT head  No acute intracranial abnormality. ASPECTS score is 10. CTA head & neck  No large vessel occlusion, hemodynamically significant stenosis, or aneurysm in the head or neck. Enlarged thyroid gland with 2.2 cm hypodense nodule in the left lobe; recommend non-emergent thyroid ultrasound. 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 Therapy recommendations:  CIR Disposition:  pending  Hypertension Home meds: Amlodipine  10 mg daily Bystolic  10 mg daily Stable Blood Pressure Goal: BP less than 220/110   Hyperlipidemia Home meds: Lipitor 40 mg LDL 145, goal < 70 Increase to Lipitor 80 mg Continue statin at discharge  Diabetes type II Uncontrolled Home meds: Metformin  1000 mg twice daily HgbA1c 7.2, goal < 7.0 CBGs, SSI Recommend close follow-up with PCP for better DM  control  Other Stroke Risk Factors Obesity, Body mass index is 47.85 kg/m., BMI >/= 30 associated with increased stroke risk, recommend weight loss, diet and exercise as appropriate  Obstructive sleep apnea, on CPAP at home  Hospital day # 0  Pt seen by Neuro NP/APP with MD. Note/plan to be edited by MD as needed.    Rocky JAYSON Likes, DNP [facility] Please use AMION for contact information & EPIC for messaging.  I have personally obtained history,examined this patient, reviewed  notes, independently viewed imaging studies, participated in medical decision making and plan of care.ROS completed by me personally and pertinent positives fully documented  I have made any additions or clarifications directly to the above note. Agree with note above.  Patient presented with sudden onset of right hemiparesis and altered mental status due to large left subcortical infarct.  Etiology to be determined.  Continue ongoing stroke workup.  Check 2D echo, bubble study and will likely need TEE.  Heart monitor paroxysmal A-fib.  Aspirin and Plavix for 3 weeks followed by Plavix alone and aggressive risk factor modification.  She also appears to be at risk for sleep apnea and will need outpatient polysomnogram.  Long discussion with patient and her daughter and other family members at the bedside and answered questions.   I personally spent a total of 50 minutes in the care of the patient today including getting/reviewing separately obtained history, performing a medically appropriate exam/evaluation, counseling and educating, placing orders, referring and communicating with other health care professionals, documenting clinical information in the EHR, independently interpreting results, and coordinating care.        Eather Popp, MD Medical Director [facility] Pager: [phone] 01/30/2024 5:01 PM   To contact Stroke Continuity provider, please refer to [email or web address]. After hours, contact  General Neurology

## 2024-01-30 NOTE — Progress Notes (Addendum)
 "                        PROGRESS NOTE        PATIENT DETAILS Name: Marisa [name] Age: 64 y.o. Sex: female Date of Birth: 1960-03-17 Admit Date: 01/28/2024 Admitting Physician Amy LOISE Free, DO ERE:Djwizmd, Catheryn, MD  Brief Summary: Patient is a 64 y.o.  female with history of HTN, HLD, DM-2-presented with right sided weakness-found to have acute CVA.  Significant events: 12/31>> admit to [facility]  Significant studies: 12/02>> A1c: 7.2 12/31>> CTA head/neck: No LVO-or significant stenosis. 01/01>> MRI brain: Left basal ganglia infarct 01/01>> UDS: Negative 01/02>> LDL: 145  Significant microbiology data: None  Procedures: None  Consults: Neurology  Subjective: Lying comfortably in bed-denies any chest pain or shortness of breath.  Continues to have right-sided facial droop/right-sided weakness.  Objective: Vitals: Blood pressure (!) 167/92, pulse 68, temperature 97.9 F (36.6 C), temperature source Oral, resp. rate 15, height 5' (1.524 m), weight 111.1 kg, SpO2 97%.   Exam: Gen Exam:Alert awake-not in any distress HEENT:atraumatic, normocephalic Chest: B/L clear to auscultation anteriorly CVS:S1S2 regular Abdomen:soft non tender, non distended Extremities:no edema Neurology: Right facial droop-right-sided hemiplegia (last about able to move RLE side-to-side). Skin: no rash  Pertinent Labs/Radiology:    Latest Ref Rng & Units 01/30/2024    4:46 AM 01/28/2024   10:53 PM 12/30/2023   11:43 AM  CBC  WBC 4.0 - 10.5 K/uL 7.3  8.6  7.0   Hemoglobin 12.0 - 15.0 g/dL 86.1  86.9  86.4   Hematocrit 36.0 - 46.0 % 41.7  40.2  44.9   Platelets 150 - 400 K/uL 281  273  304     Lab Results  Component Value Date   NA 137 01/30/2024   K 3.6 01/30/2024   CL 100 01/30/2024   CO2 23 01/30/2024      Assessment/Plan: Acute CVA Likely small vessel disease Has a right facial droop/right hemiplegia Workup as above On aspirin/Plavix On high intensity statin Await  further workup Await input from stroke team. PT/OT/SLP eval pending.  HTN Initially permissive hypertension was allowed Antihypertensives being gradually restarted Follow/optimize over next days  DM-2 Continue SSI  Recent Labs    01/29/24 1932 01/29/24 2136 01/30/24 0809  GLUCAP 134* 118* 136*    HLD Statin  2.2 cm nodule left thyroid Incidental finding on CTA head/neck Stable for outpatient nonemergent thyroid ultrasound-as this is an incidental finding.  Class III obesity: Estimated body mass index is 47.85 kg/m as calculated from the following:   Height as of this encounter: 5' (1.524 m).   Weight as of this encounter: 111.1 kg.   Code status:   Code Status: Full Code   DVT Prophylaxis: enoxaparin (LOVENOX) injection 40 mg Start: 01/30/24 1000 SCD's Start: 01/29/24 1047 Place and maintain sequential compression device Start: 01/29/24 0046 Place TED hose Start: 01/29/24 0046   Family Communication: Daughter at bedside   Disposition Plan: Status is: Observation The patient will require care spanning > 2 midnights and should be moved to inpatient because: Severity of illness   Planned Discharge Destination:Skilled nursing facility versus CIR   Diet: Diet Order             Diet heart healthy/carb modified Room service appropriate? Yes; Fluid consistency: Thin  Diet effective now                     Antimicrobial agents: Anti-infectives (  From admission, onward)    None        MEDICATIONS: Scheduled Meds:   stroke: early stages of recovery book   Does not apply Once   amLODipine   5 mg Oral Daily   aspirin EC  81 mg Oral Daily   atorvastatin   40 mg Oral Once per day on Monday Tuesday Wednesday Thursday Friday Saturday   chlorthalidone   25 mg Oral Daily   clopidogrel  75 mg Oral Daily   dapagliflozin  propanediol  10 mg Oral Daily   enoxaparin (LOVENOX) injection  40 mg Subcutaneous Q24H   folic acid   1 mg Oral Daily   insulin aspart   0-20 Units Subcutaneous TID WC   insulin aspart  0-5 Units Subcutaneous QHS   irbesartan  37.5 mg Oral Daily   nebivolol   10 mg Oral Daily   Continuous Infusions: PRN Meds:.acetaminophen  **OR** acetaminophen  (TYLENOL ) oral liquid 160 mg/5 mL **OR** acetaminophen , hydrALAZINE, senna-docusate   I have personally reviewed following labs and imaging studies  LABORATORY DATA: CBC: Recent Labs  Lab 01/28/24 2253 01/30/24 0446  WBC 8.6 7.3  NEUTROABS 3.6  --   HGB 13.0 13.8  HCT 40.2 41.7  MCV 68.6* 68.4*  PLT 273 281    Basic Metabolic Panel: Recent Labs  Lab 01/28/24 2253 01/30/24 0446  NA 140 137  K 3.1* 3.6  CL 100 100  CO2 27 23  GLUCOSE 132* 141*  BUN 12 11  CREATININE 0.79 0.74  CALCIUM  9.3 9.4    GFR: Estimated Creatinine Clearance: 80.4 mL/min (by C-G formula based on SCr of 0.74 mg/dL).  Liver Function Tests: Recent Labs  Lab 01/28/24 2253  AST 17  ALT 10  ALKPHOS 95  BILITOT 0.5  PROT 7.8  ALBUMIN 4.2   No results for input(s): LIPASE, AMYLASE in the last 168 hours. No results for input(s): AMMONIA in the last 168 hours.  Coagulation Profile: Recent Labs  Lab 01/28/24 2253  INR 1.0    Cardiac Enzymes: No results for input(s): CKTOTAL, CKMB, CKMBINDEX, TROPONINI in the last 168 hours.  BNP (last 3 results) No results for input(s): PROBNP in the last 8760 hours.  Lipid Profile: Recent Labs    01/30/24 0446  CHOL 214*  HDL 45  LDLCALC 145*  TRIG 121  CHOLHDL 4.7    Thyroid Function Tests: No results for input(s): TSH, T4TOTAL, FREET4, T3FREE, THYROIDAB in the last 72 hours.  Anemia Panel: No results for input(s): VITAMINB12, FOLATE, FERRITIN, TIBC, IRON, RETICCTPCT in the last 72 hours.  Urine analysis:    Component Value Date/Time   COLORURINE STRAW (A) 01/29/2024 0005   APPEARANCEUR CLEAR 01/29/2024 0005   LABSPEC 1.010 01/29/2024 0005   PHURINE 7.0 01/29/2024 0005   GLUCOSEU  NEGATIVE 01/29/2024 0005   HGBUR NEGATIVE 01/29/2024 0005   BILIRUBINUR NEGATIVE 01/29/2024 0005   BILIRUBINUR negative 11/26/2023 1636   BILIRUBINUR Negative 10/21/2022 1516   KETONESUR NEGATIVE 01/29/2024 0005   PROTEINUR NEGATIVE 01/29/2024 0005   UROBILINOGEN 0.2 11/26/2023 1636   UROBILINOGEN 0.2 07/31/2012 2358   NITRITE NEGATIVE 01/29/2024 0005   LEUKOCYTESUR NEGATIVE 01/29/2024 0005    Sepsis Labs: Lactic Acid, Venous    Component Value Date/Time   LATICACIDVEN 1.15 02/17/2015 0057    MICROBIOLOGY: No results found for this or any previous visit (from the past 240 hours).  RADIOLOGY STUDIES/RESULTS: MR BRAIN WO CONTRAST Result Date: 01/29/2024 EXAM: MRI BRAIN WITHOUT CONTRAST 01/29/2024 05:44:05 PM TECHNIQUE: Multiplanar multisequence MRI of the head/brain was  performed without the administration of intravenous contrast. COMPARISON: CT head and CTA head and neck 01/28/2024. CLINICAL HISTORY: Neuro deficit, acute, stroke suspected. FINDINGS: BRAIN AND VENTRICLES: There is a 2.9 x 1.5 cm focus of restricted diffusion centered in the left basal ganglia compatible with acute infarct. There is mild associated edema. No significant mass effect on the ventricles. No midline shift. No hydrocephalus. No intracranial hemorrhage. No mass. T2 and FLAIR hyperintensity throughout the periventricular and subcortical white matter suggestive of mild to moderate chronic microvascular ischemic changes. Cerebral volume within normal limits for patients age. The sella is unremarkable. Normal flow voids. ORBITS: No acute abnormality. SINUSES AND MASTOIDS: No acute abnormality. BONES AND SOFT TISSUES: Normal marrow signal. No acute soft tissue abnormality. IMPRESSION: 1. Acute infarct in the left basal ganglia with mild associated edema. No midline shift. Electronically signed by: Donnice Mania MD 01/29/2024 06:13 PM EST RP Workstation: [id number]   CT ANGIO HEAD NECK W WO CM (CODE STROKE) Result Date:  01/28/2024 EXAM: CTA HEAD AND NECK WITH 01/28/2024 11:23:40 PM TECHNIQUE: CTA of the head and neck was performed with the administration of 75 mL of iohexol (OMNIPAQUE) 350 MG/ML injection. Multiplanar 2D and/or 3D reformatted images are provided for review. Automated exposure control, iterative reconstruction, and/or weight based adjustment of the mA/kV was utilized to reduce the radiation dose to as low as reasonably achievable. Stenosis of the internal carotid arteries measured using NASCET criteria. COMPARISON: None available CLINICAL HISTORY: Neuro deficit, acute, stroke suspected. FINDINGS: CTA NECK: AORTIC ARCH AND ARCH VESSELS: No dissection or arterial injury. No significant stenosis of the brachiocephalic or subclavian arteries. CERVICAL CAROTID ARTERIES: No dissection, arterial injury, or hemodynamically significant stenosis by NASCET criteria. CERVICAL VERTEBRAL ARTERIES: No dissection, arterial injury, or significant stenosis. LUNGS AND MEDIASTINUM: Unremarkable. SOFT TISSUES: Enlarged thyroid gland with a 2.2 cm hypodense nodule in the left lobe. BONES: No acute abnormality. CTA HEAD: ANTERIOR CIRCULATION: No significant stenosis of the internal carotid arteries. No significant stenosis of the anterior cerebral arteries. No significant stenosis of the middle cerebral arteries. No aneurysm. POSTERIOR CIRCULATION: No significant stenosis of the posterior cerebral arteries. No significant stenosis of the basilar artery. No significant stenosis of the vertebral arteries. No aneurysm. OTHER: No dural venous sinus thrombosis on this non-dedicated study. IMPRESSION: 1. No large vessel occlusion, hemodynamically significant stenosis, or aneurysm in the head or neck. 2. Enlarged thyroid gland with 2.2 cm hypodense nodule in the left lobe; recommend non-emergent thyroid ultrasound. Electronically signed by: Franky Stanford MD 01/28/2024 11:32 PM EST RP Workstation: [id number]   CT HEAD CODE STROKE WO CONTRAST  (LKW 0-4.5h, LVO 0-24h) Result Date: 01/28/2024 EXAM: CT HEAD WITHOUT CONTRAST 01/28/2024 11:06:46 PM TECHNIQUE: CT of the head was performed without the administration of intravenous contrast. Automated exposure control, iterative reconstruction, and/or weight based adjustment of the mA/kV was utilized to reduce the radiation dose to as low as reasonably achievable. COMPARISON: None available. CLINICAL HISTORY: Neuro deficit, acute, stroke suspected. FINDINGS: BRAIN AND VENTRICLES: No acute hemorrhage. No evidence of acute infarct. The [place] Stroke Program Early CT Score (ASPECTS) is 10. No hydrocephalus. No extra-axial collection. No mass effect or midline shift. ORBITS: No acute abnormality. SINUSES: No acute abnormality. SOFT TISSUES AND SKULL: No acute soft tissue abnormality. No skull fracture. IMPRESSION: 1. No acute intracranial abnormality. 2. ASPECTS score is 10. Communicated to Dr. Fairy Gravely at 11:10 pm on 01/28/2024 Electronically signed by: Franky Stanford MD 01/28/2024 11:11 PM EST RP Workstation: [id number]  LOS: 0 days   Donalda Applebaum, MD  [facility]    To contact the attending provider between 7A-7P or the covering provider during after hours 7P-7A, please log into the web site [email or web address] and access using universal [place] password for that web site. If you do not have the password, please call the hospital operator.  01/30/2024, 10:21 AM    "

## 2024-01-31 ENCOUNTER — Inpatient Hospital Stay (HOSPITAL_COMMUNITY)

## 2024-01-31 DIAGNOSIS — I639 Cerebral infarction, unspecified: Secondary | ICD-10-CM | POA: Diagnosis not present

## 2024-01-31 DIAGNOSIS — I10 Essential (primary) hypertension: Secondary | ICD-10-CM | POA: Diagnosis not present

## 2024-01-31 DIAGNOSIS — E1165 Type 2 diabetes mellitus with hyperglycemia: Secondary | ICD-10-CM | POA: Diagnosis not present

## 2024-01-31 DIAGNOSIS — R2990 Unspecified symptoms and signs involving the nervous system: Secondary | ICD-10-CM | POA: Diagnosis not present

## 2024-01-31 DIAGNOSIS — E66813 Obesity, class 3: Secondary | ICD-10-CM | POA: Diagnosis not present

## 2024-01-31 DIAGNOSIS — E119 Type 2 diabetes mellitus without complications: Secondary | ICD-10-CM | POA: Diagnosis not present

## 2024-01-31 DIAGNOSIS — E785 Hyperlipidemia, unspecified: Secondary | ICD-10-CM | POA: Diagnosis not present

## 2024-01-31 DIAGNOSIS — I6389 Other cerebral infarction: Secondary | ICD-10-CM | POA: Diagnosis not present

## 2024-01-31 LAB — GLUCOSE, CAPILLARY
Glucose-Capillary: 139 mg/dL — ABNORMAL HIGH (ref 70–99)
Glucose-Capillary: 167 mg/dL — ABNORMAL HIGH (ref 70–99)
Glucose-Capillary: 135 mg/dL — ABNORMAL HIGH (ref 70–99)
Glucose-Capillary: 182 mg/dL — ABNORMAL HIGH (ref 70–99)

## 2024-01-31 NOTE — Progress Notes (Signed)
 "                        PROGRESS NOTE        PATIENT DETAILS Name: Marisa [name] Age: 64 y.o. Sex: female Date of Birth: 1960/05/10 Admit Date: 01/28/2024 Admitting Physician Amy LOISE Free, DO ERE:Djwizmd, Catheryn, MD  Brief Summary: Patient is a 64 y.o.  female with history of HTN, HLD, DM-2-presented with right sided weakness-found to have acute CVA.  Significant events: 12/31>> admit to [facility]  Significant studies: 12/02>> A1c: 7.2 12/31>> CTA head/neck: No LVO-or significant stenosis. 01/01>> MRI brain: Left basal ganglia infarct 01/01>> UDS: Negative 01/02>> LDL: 145 01/02>> echo: EF 39-34%, grade 1 diastolic dysfunction  Significant microbiology data: None  Procedures: None  Consults: Neurology  Subjective: Sleeping comfortably-no major issues-continues to have unchanged right-sided hemiplegia.  Objective: Vitals: Blood pressure (!) 141/78, pulse 61, temperature 97.7 F (36.5 C), temperature source Oral, resp. rate 16, height 5' (1.524 m), weight 111.1 kg, SpO2 97%.   Exam: Sleeping comfortably but not in any distress Unchanged right-sided hemiplegia/right facial droop.  Pertinent Labs/Radiology:    Latest Ref Rng & Units 01/30/2024    4:46 AM 01/28/2024   10:53 PM 12/30/2023   11:43 AM  CBC  WBC 4.0 - 10.5 K/uL 7.3  8.6  7.0   Hemoglobin 12.0 - 15.0 g/dL 86.1  86.9  86.4   Hematocrit 36.0 - 46.0 % 41.7  40.2  44.9   Platelets 150 - 400 K/uL 281  273  304     Lab Results  Component Value Date   NA 137 01/30/2024   K 3.6 01/30/2024   CL 100 01/30/2024   CO2 23 01/30/2024      Assessment/Plan: Acute CVA Likely small vessel disease versus cardioembolic Unchanged facial droop/right-sided hemiplegia Workup as above On aspirin /Plavix  On high intensity statin Await further recommendations from stroke service.  HTN Initially permissive hypertension was allowed Antihypertensives has been gradually started BP now stable-continue  amlodipine /chlorthalidone /Avapro  and Bystolic . Follow/optimize  DM-2 Continue SSI  Recent Labs    01/30/24 1706 01/30/24 2126 01/31/24 0819  GLUCAP 169* 162* 139*    HLD Statin  OSA CPAP nightly  2.2 cm nodule left thyroid Incidental finding on CTA head/neck Stable for outpatient nonemergent thyroid ultrasound-as this is an incidental finding.  Class III obesity: Estimated body mass index is 47.85 kg/m as calculated from the following:   Height as of this encounter: 5' (1.524 m).   Weight as of this encounter: 111.1 kg.   Code status:   Code Status: Full Code   DVT Prophylaxis: enoxaparin  (LOVENOX ) injection 40 mg Start: 01/30/24 1000 SCD's Start: 01/29/24 1047 Place and maintain sequential compression device Start: 01/29/24 0046 Place TED hose Start: 01/29/24 0046   Family Communication: Son at bedside   Disposition Plan: Status is: Observation The patient will require care spanning > 2 midnights and should be moved to inpatient because: Severity of illness   Planned Discharge Destination:Skilled nursing facility versus CIR   Diet: Diet Order             Diet heart healthy/carb modified Room service appropriate? Yes; Fluid consistency: Thin  Diet effective now                     Antimicrobial agents: Anti-infectives (From admission, onward)    None        MEDICATIONS: Scheduled Meds:  amLODipine   5 mg Oral Daily  aspirin  EC  81 mg Oral Daily   atorvastatin   80 mg Oral Once per day on Monday Tuesday Wednesday Thursday Friday Saturday   chlorthalidone   25 mg Oral Daily   clopidogrel   75 mg Oral Daily   dapagliflozin  propanediol  10 mg Oral Daily   enoxaparin  (LOVENOX ) injection  40 mg Subcutaneous Q24H   folic acid   1 mg Oral Daily   insulin  aspart  0-20 Units Subcutaneous TID WC   insulin  aspart  0-5 Units Subcutaneous QHS   irbesartan   37.5 mg Oral Daily   nebivolol   10 mg Oral Daily   Continuous Infusions: PRN  Meds:.acetaminophen  **OR** acetaminophen  (TYLENOL ) oral liquid 160 mg/5 mL **OR** acetaminophen , hydrALAZINE , senna-docusate   I have personally reviewed following labs and imaging studies  LABORATORY DATA: CBC: Recent Labs  Lab 01/28/24 2253 01/30/24 0446  WBC 8.6 7.3  NEUTROABS 3.6  --   HGB 13.0 13.8  HCT 40.2 41.7  MCV 68.6* 68.4*  PLT 273 281    Basic Metabolic Panel: Recent Labs  Lab 01/28/24 2253 01/30/24 0446  NA 140 137  K 3.1* 3.6  CL 100 100  CO2 27 23  GLUCOSE 132* 141*  BUN 12 11  CREATININE 0.79 0.74  CALCIUM  9.3 9.4    GFR: Estimated Creatinine Clearance: 80.4 mL/min (by C-G formula based on SCr of 0.74 mg/dL).  Liver Function Tests: Recent Labs  Lab 01/28/24 2253  AST 17  ALT 10  ALKPHOS 95  BILITOT 0.5  PROT 7.8  ALBUMIN 4.2   No results for input(s): LIPASE, AMYLASE in the last 168 hours. No results for input(s): AMMONIA in the last 168 hours.  Coagulation Profile: Recent Labs  Lab 01/28/24 2253  INR 1.0    Cardiac Enzymes: No results for input(s): CKTOTAL, CKMB, CKMBINDEX, TROPONINI in the last 168 hours.  BNP (last 3 results) No results for input(s): PROBNP in the last 8760 hours.  Lipid Profile: Recent Labs    01/30/24 0446  CHOL 214*  HDL 45  LDLCALC 145*  TRIG 121  CHOLHDL 4.7    Thyroid Function Tests: No results for input(s): TSH, T4TOTAL, FREET4, T3FREE, THYROIDAB in the last 72 hours.  Anemia Panel: No results for input(s): VITAMINB12, FOLATE, FERRITIN, TIBC, IRON, RETICCTPCT in the last 72 hours.  Urine analysis:    Component Value Date/Time   COLORURINE STRAW (A) 01/29/2024 0005   APPEARANCEUR CLEAR 01/29/2024 0005   LABSPEC 1.010 01/29/2024 0005   PHURINE 7.0 01/29/2024 0005   GLUCOSEU NEGATIVE 01/29/2024 0005   HGBUR NEGATIVE 01/29/2024 0005   BILIRUBINUR NEGATIVE 01/29/2024 0005   BILIRUBINUR negative 11/26/2023 1636   BILIRUBINUR Negative 10/21/2022 1516    KETONESUR NEGATIVE 01/29/2024 0005   PROTEINUR NEGATIVE 01/29/2024 0005   UROBILINOGEN 0.2 11/26/2023 1636   UROBILINOGEN 0.2 07/31/2012 2358   NITRITE NEGATIVE 01/29/2024 0005   LEUKOCYTESUR NEGATIVE 01/29/2024 0005    Sepsis Labs: Lactic Acid, Venous    Component Value Date/Time   LATICACIDVEN 1.15 02/17/2015 0057    MICROBIOLOGY: No results found for this or any previous visit (from the past 240 hours).  RADIOLOGY STUDIES/RESULTS: ECHOCARDIOGRAM COMPLETE Result Date: 01/30/2024    ECHOCARDIOGRAM REPORT   Patient Name:   Marisa [name] Janes Date of Exam: 01/30/2024 Medical Rec #:  [id number]            Height:       60.0 in Accession #:    [id number]  Weight:       245.0 lb Date of Birth:  20-Mar-1960           BSA:          2.035 m Patient Age:    63 years             BP:           167/92 mmHg Patient Gender: F                    HR:           71 bpm. Exam Location:  Inpatient Procedure: 2D Echo, Cardiac Doppler and Color Doppler (Both Spectral and Color            Flow Doppler were utilized during procedure). Indications:    Stroke and TIA  History:        Patient has prior history of Echocardiogram examinations, most                 recent 04/16/2022. Risk Factors:Hypertension and Diabetes.  Sonographer:    Sherlean Dubin Referring Phys: [id number] AMY N COX  Sonographer Comments: Image acquisition challenging due to patient body habitus and Image acquisition challenging due to respiratory motion. IMPRESSIONS  1. Left ventricular ejection fraction, by estimation, is 65 to 70%. The left ventricle has normal function. The left ventricle has no regional wall motion abnormalities. There is mild concentric left ventricular hypertrophy. Left ventricular diastolic parameters are consistent with Grade I diastolic dysfunction (impaired relaxation).  2. Right ventricular systolic function is normal. The right ventricular size is normal.  3. The mitral valve is normal in structure. No evidence of  mitral valve regurgitation. No evidence of mitral stenosis.  4. The aortic valve is normal in structure. Aortic valve regurgitation is not visualized. No aortic stenosis is present.  5. The inferior vena cava is normal in size with greater than 50% respiratory variability, suggesting right atrial pressure of 3 mmHg. FINDINGS  Left Ventricle: Left ventricular ejection fraction, by estimation, is 65 to 70%. The left ventricle has normal function. The left ventricle has no regional wall motion abnormalities. The left ventricular internal cavity size was normal in size. There is  mild concentric left ventricular hypertrophy. Left ventricular diastolic parameters are consistent with Grade I diastolic dysfunction (impaired relaxation). Right Ventricle: The right ventricular size is normal. No increase in right ventricular wall thickness. Right ventricular systolic function is normal. Left Atrium: Left atrial size was normal in size. Right Atrium: Right atrial size was normal in size. Pericardium: There is no evidence of pericardial effusion. Mitral Valve: The mitral valve is normal in structure. No evidence of mitral valve regurgitation. No evidence of mitral valve stenosis. MV peak gradient, 3.3 mmHg. The mean mitral valve gradient is 1.0 mmHg. Tricuspid Valve: The tricuspid valve is normal in structure. Tricuspid valve regurgitation is trivial. No evidence of tricuspid stenosis. Aortic Valve: The aortic valve is normal in structure. Aortic valve regurgitation is not visualized. No aortic stenosis is present. Aortic valve mean gradient measures 3.5 mmHg. Aortic valve peak gradient measures 6.2 mmHg. Aortic valve area, by VTI measures 2.20 cm. Pulmonic Valve: The pulmonic valve was normal in structure. Pulmonic valve regurgitation is not visualized. No evidence of pulmonic stenosis. Aorta: The aortic root is normal in size and structure. Venous: The inferior vena cava is normal in size with greater than 50% respiratory  variability, suggesting right atrial pressure of 3 mmHg. IAS/Shunts: No atrial level shunt  detected by color flow Doppler.  LEFT VENTRICLE PLAX 2D LVIDd:         4.70 cm   Diastology LVIDs:         3.50 cm   LV e' medial:    4.20 cm/s LV PW:         1.10 cm   LV E/e' medial:  12.9 LV IVS:        1.00 cm   LV e' lateral:   5.44 cm/s LVOT diam:     2.00 cm   LV E/e' lateral: 9.9 LV SV:         58 LV SV Index:   29 LVOT Area:     3.14 cm  RIGHT VENTRICLE             IVC RV Basal diam:  3.60 cm     IVC diam: 1.30 cm RV Mid diam:    3.30 cm RV S prime:     13.80 cm/s TAPSE (M-mode): 2.1 cm LEFT ATRIUM           Index        RIGHT ATRIUM           Index LA diam:      2.50 cm 1.23 cm/m   RA Area:     16.10 cm LA Vol (A2C): 53.1 ml 26.10 ml/m  RA Volume:   39.40 ml  19.36 ml/m LA Vol (A4C): 43.5 ml 21.38 ml/m  AORTIC VALVE AV Area (Vmax):    2.45 cm AV Area (Vmean):   2.29 cm AV Area (VTI):     2.20 cm AV Vmax:           124.50 cm/s AV Vmean:          84.500 cm/s AV VTI:            0.265 m AV Peak Grad:      6.2 mmHg AV Mean Grad:      3.5 mmHg LVOT Vmax:         97.25 cm/s LVOT Vmean:        61.700 cm/s LVOT VTI:          0.186 m LVOT/AV VTI ratio: 0.70  AORTA Ao Root diam: 2.70 cm Ao Asc diam:  3.20 cm MITRAL VALVE               TRICUSPID VALVE MV Area (PHT): 2.56 cm    TR Peak grad:   9.1 mmHg MV Area VTI:   2.58 cm    TR Vmax:        151.00 cm/s MV Peak grad:  3.3 mmHg MV Mean grad:  1.0 mmHg    SHUNTS MV Vmax:       0.91 m/s    Systemic VTI:  0.19 m MV Vmean:      52.6 cm/s   Systemic Diam: 2.00 cm MV Decel Time: 296 msec MR Peak grad: 6.4 mmHg MR Vmax:      126.00 cm/s MV E velocity: 54.00 cm/s MV A velocity: 78.40 cm/s MV E/A ratio:  0.69 Morene Brownie Electronically signed by Morene Brownie Signature Date/Time: 01/30/2024/11:10:46 AM    Final    MR BRAIN WO CONTRAST Result Date: 01/29/2024 EXAM: MRI BRAIN WITHOUT CONTRAST 01/29/2024 05:44:05 PM TECHNIQUE: Multiplanar multisequence MRI of the head/brain  was performed without the administration of intravenous contrast. COMPARISON: CT head and CTA head and neck 01/28/2024. CLINICAL HISTORY: Neuro deficit, acute, stroke suspected. FINDINGS: BRAIN AND VENTRICLES:  There is a 2.9 x 1.5 cm focus of restricted diffusion centered in the left basal ganglia compatible with acute infarct. There is mild associated edema. No significant mass effect on the ventricles. No midline shift. No hydrocephalus. No intracranial hemorrhage. No mass. T2 and FLAIR hyperintensity throughout the periventricular and subcortical white matter suggestive of mild to moderate chronic microvascular ischemic changes. Cerebral volume within normal limits for patients age. The sella is unremarkable. Normal flow voids. ORBITS: No acute abnormality. SINUSES AND MASTOIDS: No acute abnormality. BONES AND SOFT TISSUES: Normal marrow signal. No acute soft tissue abnormality. IMPRESSION: 1. Acute infarct in the left basal ganglia with mild associated edema. No midline shift. Electronically signed by: Donnice Mania MD 01/29/2024 06:13 PM EST RP Workstation: [id number]     LOS: 1 day   Donalda Applebaum, MD  [facility]    To contact the attending provider between 7A-7P or the covering provider during after hours 7P-7A, please log into the web site [email or web address] and access using universal [name] password for that web site. If you do not have the password, please call the hospital operator.  01/31/2024, 10:12 AM    "

## 2024-01-31 NOTE — Progress Notes (Signed)
"         STROKE TEAM PROGRESS NOTE  SUBJECTIVE : Patient continues to have dense right hemiplegia and facial droop.  Neurological exam is unchanged.  TCD bubble study done at the bedside is negative for PFO.  Physical therapy recommend inpatient rehab  OBJECTIVE:   Vital signs : Blood pressure (!) 160/76, pulse 68, temperature 98.8 F (37.1 C), temperature source Oral, resp. rate 18, height 5' (1.524 m), weight 111.1 kg, SpO2 97%.  Physical exam  . Afebrile. Head is nontraumatic. Neck is supple without bruit.    Cardiac exam no murmur or gallop. Lungs are clear to auscultation. Distal pulses are well felt.  Neurological exam :   Awake  Alert oriented x 3. Normal speech and language.eye movements full without nystagmus.fundi were not visualized. Vision acuity and fields appear normal. Hearing is normal. Palatal movements are normal. Face symmetric. Tongue midline. Normal strength, tone, reflexes and coordination. Normal sensation. Gait deferred. No results found.   CT head no acute abnormality MRI acute infarct left basal ganglia with mild associated edema CT angio no large vessel occlusion proximal Echo EF 65 to 70%.  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  Vascular Risk Factors: Morbid obesity, diabetes, hypertension, hyperlipidemia at risk for sleep apnea  PLAN: Continue aspirin  and Plavix  for stroke prevention for 3 weeks followed by Plavix  alone.  Check TEE for cardiac source of embolism.  Continue telemetry monitoring.  Mobilize out of bed.  Physical Occupational Therapy consults.  Likely transfer to inpatient rehab in a few days after insurance approval.    I personally spent a total of 35 minutes in the care of the patient today including getting/reviewing separately obtained history, performing a medically appropriate exam/evaluation,  counseling and educating, placing orders, referring and communicating with other health care professionals, documenting clinical information in the EHR, independently interpreting results, and coordinating care.         Eather Popp, MD Medical Director [facility] Pager: [phone] 01/31/2024 1:16 PM  "

## 2024-01-31 NOTE — PMR Pre-admission (Signed)
 PMR Admission Coordinator Pre-Admission Assessment  Patient: Marisa [name] is an 64 y.o., female MRN: [id number] DOB: 1961/01/20 Height: 5' (152.4 cm) Weight: 111.1 kg  Insurance Information HMO:     PPO:      PCP:      IPA:      80/20:      OTHER:  PRIMARY: [facility]      Policy#: [id number]      Subscriber: patient CM Name:       Phone#:      Fax#: [phone] and/or [phone] Pre-Cert#: Not required - just submit claim at the time of discharge.  No updates needed.      Employer: Retired Public Affairs Consultant:  Phone #: [phone]     Name:  Eff. Date: 02/25/17 to 12/26/24     Deduct:       Out of Pocket Max: $3000 ($855 met)      Life Max:  CIR: 75%      SNF: 75% Outpatient: 75%     Co-Pay:  Home Health: 75%      Co-Pay:  DME: 75%     Co-Pay:  Providers: in network  SECONDARY:       Policy#:      Phone#:   Artist:       Phone#:   The Best Boy for patients in Inpatient Rehabilitation Facilities with attached Privacy Act Statement-Health Care Records was provided and verbally reviewed with: Patient  Emergency Contact Information Contact Information     Name Relation Home Work Mobile   Zhong,Jeffrey Sr Spouse [phone]  [phone]      Other Contacts   None on File     Current Medical History  Patient Admitting Diagnosis: L BG infarct  History of Present Illness: A 64 y.o. female with history of HTN, DM, HLD,Obesity, OSA who presented to [facility] on 12/32/25 complaining of altered mental status and weakness, starting in right arm which started on 12/31 around 5 in the afternoon.   Code stroke was activated by EDP.  Evaluated emergently by teleneurology.  CT CTA negative.  Outside of window for TNK treatment.  MRI shows acute basil ganglia infarct. Transferred to [facility] on 01/29/24 for further evaluation.   PT/OT/SLP evaluations completed with recommendations for acute inpatient rehab  admission.  Complete NIHSS TOTAL: 11  Patient's medical record from [facility] has been reviewed by the rehabilitation admission coordinator and physician.  Past Medical History  Past Medical History:  Diagnosis Date   Borderline diabetes    Hypertension     Has the patient had major surgery during 100 days prior to admission? No  Family History   family history includes Diabetes in her mother; Hypertension in her mother.  Current Medications Current Medications[1]  Patients Current Diet:  Diet Order             Diet Carb Modified           Diet - low sodium heart healthy           Diet heart healthy/carb modified Room service appropriate? Yes; Fluid consistency: Thin  Diet effective now                   Precautions / Restrictions Precautions Precautions: Fall Precaution/Restrictions Comments: Note flaccid R UE Restrictions Weight Bearing Restrictions Per Provider Order: No   Has the patient had 2 or more falls or a fall with injury in the past year? No  Prior  Activity Level Community (5-7x/wk): Went out daily, was driving, not working.  Prior Functional Level Self Care: Did the patient need help bathing, dressing, using the toilet or eating? Independent  Indoor Mobility: Did the patient need assistance with walking from room to room (with or without device)? Independent  Stairs: Did the patient need assistance with internal or external stairs (with or without device)? Independent  Functional Cognition: Did the patient need help planning regular tasks such as shopping or remembering to take medications? Independent  Patient Information Are you of Hispanic, Latino/a,or Spanish origin?: A. No, not of Hispanic, Latino/a, or Spanish origin What is your race?: B. Black or African American Do you need or want an interpreter to communicate with a doctor or health care staff?: 0. No  Patient's Response To:  Health Literacy and Transportation Is the  patient able to respond to health literacy and transportation needs?: Yes Health Literacy - How often do you need to have someone help you when you read instructions, pamphlets, or other written material from your doctor or pharmacy?: Never In the past 12 months, has lack of transportation kept you from medical appointments or from getting medications?: No In the past 12 months, has lack of transportation kept you from meetings, work, or from getting things needed for daily living?: No  Home Assistive Devices / Equipment Home Equipment: Grab bars - tub/shower  Prior Device Use: Indicate devices/aids used by the patient prior to current illness, exacerbation or injury? None of the above  Current Functional Level Cognition  Arousal/Alertness: Awake/alert Overall Cognitive Status: Impaired/Different from baseline Orientation Level: Oriented X4 Attention: Focused, Sustained Focused Attention: Appears intact Sustained Attention: Appears intact Memory: Impaired Memory Impairment: Decreased short term memory Decreased Short Term Memory: Verbal basic Awareness: Appears intact Problem Solving: Impaired Problem Solving Impairment: Verbal complex Safety/Judgment: Appears intact    Extremity Assessment (includes Sensation/Coordination)  Upper Extremity Assessment: Right hand dominant, RUE deficits/detail RUE Deficits / Details: Flaccid; ? shoulder elevation - trace RUE Sensation: decreased light touch, decreased proprioception RUE Coordination: decreased fine motor, decreased gross motor  Lower Extremity Assessment: Defer to PT evaluation RLE Deficits / Details: No muslce activation noted in L LE despite verbal and tactile cues. Pt reports sensation WNL to light touch. RLE Sensation: WNL LLE Deficits / Details: LLE strength WFL LLE Sensation: WNL LLE Coordination: WNL    ADLs  Overall ADL's : Needs assistance/impaired Eating/Feeding: Set up, Sitting Grooming: Minimal assistance, Bed  level Upper Body Bathing: Moderate assistance, Bed level Lower Body Bathing: Maximal assistance, Bed level, Sit to/from stand Upper Body Dressing : Moderate assistance, Sitting Lower Body Dressing: Maximal assistance, Bed level Toilet Transfer: Maximal assistance, Squat-pivot Toileting- Clothing Manipulation and Hygiene: Maximal assistance, Bed level (encourage use of urinal) Functional mobility during ADLs: Maximal assistance    Mobility  Overal bed mobility: Needs Assistance Bed Mobility: Supine to Sit Supine to sit: Min assist Sit to supine: Max assist General bed mobility comments: Pt uses L LE to push R LE towards EOB. Uses L UE to initiate roll and push trunk to upright position. MinA for trunk assist and management on R LE once over EOB for positioning. Assist to scoot forward EOB.    Transfers  Overall transfer level: Needs assistance Equipment used: Ambulation equipment used Transfers: Sit to/from Stand, Bed to chair/wheelchair/BSC Sit to Stand: Contact guard assist, Via lift equipment Bed to/from chair/wheelchair/BSC transfer type:: Via Lift equipment Squat pivot transfers: Max assist Transfer via Lift Equipment: Stedy General transfer comment:  Pt pulls self to standing using L UE and LE with guarding for balance. Pt initially leans forward into steady, improves posture with cues and placement of R UE on stedy bar. TotalA to reposition R LE on stedy in standing. Dependent on stedy for transfer to chair. Improved trunk control while seated and standing.    Ambulation / Gait / Stairs / Wheelchair Mobility  Ambulation/Gait General Gait Details: unable Pre-gait activities: weight shifting in stedy    Posture / Balance Dynamic Sitting Balance Sitting balance - Comments: Pt with improving trunk control while EOB. Supervision for seated balance when feet are supported, CGA/minA when unsupported. Pt able to bring trunk in and out of COG with seated UE weight bearing  exercises. Balance Overall balance assessment: Needs assistance Sitting-balance support: Feet supported, Bilateral upper extremity supported Sitting balance-Leahy Scale: Fair Sitting balance - Comments: Pt with improving trunk control while EOB. Supervision for seated balance when feet are supported, CGA/minA when unsupported. Pt able to bring trunk in and out of COG with seated UE weight bearing exercises. Postural control: Right lateral lean Standing balance support: Bilateral upper extremity supported, During functional activity, Reliant on assistive device for balance Standing balance-Leahy Scale: Poor Standing balance comment: Standing balance dependent on stedy in standing. Mod-max UE support to remain standing.    Special considerations/life events  Diabetic management borderline diabetic and Special service needs None   Previous Home Environment (from acute therapy documentation) Living Arrangements: Spouse/significant other  Lives With: Spouse Available Help at Discharge: Family, Available 24 hours/day Type of Home: House Home Layout: One level, Laundry or work area in basement Home Access: Stairs to enter Entrance Stairs-Rails: Right Secretary/administrator of Steps: 2 Bathroom Shower/Tub: Health Visitor: Standard Bathroom Accessibility: Yes How Accessible: Accessible via walker Home Care Services: No  Discharge Living Setting Plans for Discharge Living Setting: Patient's home, House, Lives with (comment) (Lives with husband) Type of Home at Discharge: House Discharge Home Layout: Two level, Able to live on main level with bedroom/bathroom, Laundry or work area in basement Alternate Teacher, Music of Steps: Flight (Does not use basement.  Can stay on mail level.) Discharge Home Access: Stairs to enter Entrance Stairs-Rails: Right Entrance Stairs-Number of Steps: 3 Discharge Bathroom Shower/Tub: Walk-in shower, Door Discharge Bathroom Toilet:  Standard Discharge Bathroom Accessibility: Yes How Accessible: Accessible via wheelchair, Accessible via walker Does the patient have any problems obtaining your medications?: No  Social/Family/Support Systems Patient Roles: Spouse Contact Information: Reyes ku - spouse Anticipated Caregiver: Husband Anticipated Caregiver's Contact Information: Reyes - husband - Ability/Limitations of Caregiver: Husband is retired and can Geneticist, Molecular Availability: 24/7 Discharge Plan Discussed with Primary Caregiver: Yes Is Caregiver In Agreement with Plan?: Yes Does Caregiver/Family have Issues with Lodging/Transportation while Pt is in Rehab?: No  Goals Patient/Family Goal for Rehab: PT/OT/SLP supervision to min assist goals Expected length of stay: 12-14 days Pt/Family Agrees to Admission and willing to participate: Yes Program Orientation Provided & Reviewed with Pt/Caregiver Including Roles  & Responsibilities: Yes  Decrease burden of Care through IP rehab admission: N/A  Possible need for SNF placement upon discharge: Not planned  Patient Condition: I have reviewed medical records from [facility], spoken with CM, and patient and spouse. I met with patient at the bedside for inpatient rehabilitation assessment.  Patient will benefit from ongoing PT, OT, and SLP, can actively participate in 3 hours of therapy a day 5 days of the week, and can make measurable gains during the admission.  Patient will also benefit from the coordinated team approach during an Inpatient Acute Rehabilitation admission.  The patient will receive intensive therapy as well as Rehabilitation physician, nursing, social worker, and care management interventions.  Due to bladder management, bowel management, safety, skin/wound care, disease management, medication administration, pain management, and patient education the patient requires 24 hour a day rehabilitation nursing.  The patient is currently min to  max assist with mobility and basic ADLs.  Discharge setting and therapy post discharge at home with outpatient is anticipated.  Patient has agreed to participate in the Acute Inpatient Rehabilitation Program and will admit today.  Preadmission Screen Completed By:  Lovett CHRISTELLA Ropes, 02/05/2024 11:24 AM ______________________________________________________________________   Discussed status with Dr. Babs on 02/05/24 at 0930 and received approval for admission today.  Admission Coordinator:  Lovett CHRISTELLA Ropes, RN, time 1126/Date 02/05/24   Assessment/Plan: Diagnosis: L Basal ganglia stroke Does the need for close, 24 hr/day Medical supervision in concert with the patient's rehab needs make it unreasonable for this patient to be served in a less intensive setting? Yes Co-Morbidities requiring supervision/potential complications: HTN, HLD, DM; morbid obesity, OSA-  Due to bladder management, bowel management, safety, skin/wound care, disease management, medication administration, pain management, and patient education, does the patient require 24 hr/day rehab nursing? Yes Does the patient require coordinated care of a physician, rehab nurse, PT, OT, and SLP to address physical and functional deficits in the context of the above medical diagnosis(es)? Yes Addressing deficits in the following areas: balance, endurance, locomotion, strength, transferring, bowel/bladder control, bathing, dressing, feeding, grooming, toileting, cognition, and speech Can the patient actively participate in an intensive therapy program of at least 3 hrs of therapy 5 days a week? Yes The potential for patient to make measurable gains while on inpatient rehab is good Anticipated functional outcomes upon discharge from inpatient rehab: supervision and min assist PT, supervision and min assist OT, supervision and min assist SLP Estimated rehab length of stay to reach the above functional goals is: 12-16 days Anticipated discharge  destination: Home 10. Overall Rehab/Functional Prognosis: good   MD Signature:      [1]  Current Facility-Administered Medications:    amLODipine  (NORVASC ) tablet 5 mg, 5 mg, Oral, Daily, Ghimire, Donalda CHRISTELLA, MD, 5 mg at 02/05/24 0910   aspirin  EC tablet 81 mg, 81 mg, Oral, Daily, Powell, Lisa K, RPH, 81 mg at 02/04/24 2157   atorvastatin  (LIPITOR) tablet 80 mg, 80 mg, Oral, Once per day on Monday Tuesday Wednesday Thursday Friday Saturday, Judithe Rocky BROCKS, NP, 80 mg at 02/05/24 9089   bisacodyl  (DULCOLAX) suppository 10 mg, 10 mg, Rectal, Daily PRN, Ghimire, Shanker M, MD, 10 mg at 02/02/24 1652   chlorthalidone  (HYGROTON ) tablet 25 mg, 25 mg, Oral, Daily, Sundil, Subrina, MD, 25 mg at 02/05/24 9081   clopidogrel  (PLAVIX ) tablet 75 mg, 75 mg, Oral, Daily, Sundil, Subrina, MD, 75 mg at 02/05/24 9089   dapagliflozin  propanediol (FARXIGA ) tablet 10 mg, 10 mg, Oral, Daily, Gretel Prentice BIRCH, RPH, 10 mg at 02/05/24 9081   enoxaparin  (LOVENOX ) injection 40 mg, 40 mg, Subcutaneous, Q24H, Sundil, Subrina, MD, 40 mg at 02/05/24 9089   folic acid  (FOLVITE ) tablet 1 mg, 1 mg, Oral, Daily, Cox, Amy N, DO, 1 mg at 02/05/24 0930   hydrALAZINE  (APRESOLINE ) injection 10 mg, 10 mg, Intravenous, Q6H PRN, Ghimire, Donalda CHRISTELLA, MD   insulin  aspart (novoLOG ) injection 0-20 Units, 0-20 Units, Subcutaneous, TID WC, Cox, Amy N, DO, 3 Units at 02/05/24 [phone]  insulin  aspart (novoLOG ) injection 0-5 Units, 0-5 Units, Subcutaneous, QHS, Cox, Amy N, DO   irbesartan  (AVAPRO ) tablet 37.5 mg, 37.5 mg, Oral, Daily, Sundil, Subrina, MD, 37.5 mg at 02/05/24 9081   nebivolol  (BYSTOLIC ) tablet 10 mg, 10 mg, Oral, Daily, Sundil, Subrina, MD, 10 mg at 02/05/24 9081   Oral care mouth rinse, 15 mL, Mouth Rinse, PRN, Ghimire, Donalda HERO, MD   polyethylene glycol (MIRALAX  / GLYCOLAX ) packet 17 g, 17 g, Oral, Daily, Ghimire, Donalda HERO, MD, 17 g at 02/05/24 0919   senna-docusate (Senokot-S) tablet 2 tablet, 2 tablet, Oral, QHS, Ghimire,  Donalda HERO, MD, 2 tablet at 02/04/24 2157

## 2024-01-31 NOTE — Progress Notes (Signed)
 VASCULAR LAB    TCD bubble study has been performed.  See CV proc for preliminary results.   Addilyne Backs, RVT 01/31/2024, 12:51 PM

## 2024-01-31 NOTE — Progress Notes (Signed)
 Physical Therapy Treatment Patient Details Name: Marisa [name] MRN: [id number] DOB: [birth year] Today's Date: 01/31/2024   History of Present Illness 64 y.o female presents to [facility] on 12/31 with R sided weakness and slurred speech. CT (-). 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 PMH: HTN, DM2, HLD.    PT Comments  Session focused on trialing STS transfer using sara stedy to promote weight bearing, standing tolerance, and proprioception to promote muscle activation with functional tasks. Pt requires modA for bed mobility for R LE management and hand held assist to pull trunk upright. Pt initially demonstrates improved trunk control while EOB, however, R trunk lean noted when unsupported on sara stedy and required blocking and cuing to correct. Pt toelrated standing on sara stedy ~5 minutes, modA to stand, and demonstrates weight shifts with R knee block.  Pt would benefit from continued PT services focused on bed mobility, ROM, strength, and transfers to progress functional mobility. Pt appropriate for 3hrs rehab upon discharge in order to optimize functional gains.    If plan is discharge home, recommend the following: Two people to help with walking and/or transfers;Two people to help with bathing/dressing/bathroom;Assistance with cooking/housework;Assistance with feeding;Direct supervision/assist for medications management;Direct supervision/assist for financial management;Assist for transportation;Help with stairs or ramp for entrance   Can travel by private vehicle        Equipment Recommendations  Rolling walker (2 wheels);BSC/3in1;Wheelchair (measurements PT);Wheelchair cushion (measurements PT)    Recommendations for Other Services       Precautions / Restrictions Precautions Precautions: Fall Recall of Precautions/Restrictions: Intact Precaution/Restrictions Comments: Note flaccid R UE Restrictions Weight Bearing Restrictions Per Provider Order: No     Mobility  Bed  Mobility Overal bed mobility: Needs Assistance Bed Mobility: Supine to Sit     Supine to sit: Mod assist     General bed mobility comments: R LE management and trunk assist. Pt pulls trunk upright using L UE  via hand held assist. Steady upon sitting EOB. Attempts to support R LE with L LE to mobilize legs EOB, has difficulty pivoting in this position.    Transfers Overall transfer level: Needs assistance Equipment used: Ambulation equipment used Transfers: Sit to/from Stand, Bed to chair/wheelchair/BSC Sit to Stand: Mod assist (L UE pulling on sara stedy, modA for hip clearance to place platforms)           General transfer comment: Pt does well with sara stedy. R lean noted when seated on platforms, blocking required to maintain upright posture. Has good strength to pull trunk upright when cued. Assist to place and maintain R UE position on sara stedy. R knee against knee block in standing. Transfer via Lift Equipment: Stedy  Ambulation/Gait                   Stairs             Wheelchair Mobility     Tilt Bed    Modified Rankin (Stroke Patients Only)       Balance Overall balance assessment: Needs assistance Sitting-balance support: Feet supported, Bilateral upper extremity supported Sitting balance-Leahy Scale: Fair Sitting balance - Comments: Good seated balance with feet supported. R lean possibly prevented due to elevated head rest while EOB, R lean noted when seated unsupported on sara stedy. R UE placed in position of support but no muscle activation to provide support. Postural control: Right lateral lean Standing balance support: Single extremity supported, Reliant on assistive device for balance Standing balance-Leahy Scale: Poor Standing  balance comment: Reliant on sara stedy and L UE support for standing balance. R knee reliant on knee block due to buckling.                            Communication Communication Communication:  Impaired Factors Affecting Communication: Difficulty expressing self;Reduced clarity of speech  Cognition Arousal: Alert Behavior During Therapy: WFL for tasks assessed/performed   PT - Cognitive impairments: No apparent impairments                         Following commands: Intact      Cueing Cueing Techniques: Verbal cues, Tactile cues, Visual cues  Exercises      General Comments General comments (skin integrity, edema, etc.): VSS throughout. No new skin abnormalities noted.      Pertinent Vitals/Pain Pain Assessment Pain Assessment: No/denies pain    Home Living                          Prior Function            PT Goals (current goals can now be found in the care plan section) Progress towards PT goals: Progressing toward goals    Frequency    Min 3X/week      PT Plan      Co-evaluation              AM-PAC PT 6 Clicks Mobility   Outcome Measure  Help needed turning from your back to your side while in a flat bed without using bedrails?: A Little Help needed moving from lying on your back to sitting on the side of a flat bed without using bedrails?: A Lot Help needed moving to and from a bed to a chair (including a wheelchair)?: Total Help needed standing up from a chair using your arms (e.g., wheelchair or bedside chair)?: A Lot Help needed to walk in hospital room?: Total Help needed climbing 3-5 steps with a railing? : Total 6 Click Score: 10    End of Session   Activity Tolerance: Patient tolerated treatment well Patient left: in chair;with call bell/phone within reach;with family/visitor present Nurse Communication: Mobility status;Need for lift equipment PT Visit Diagnosis: Unsteadiness on feet (R26.81);Muscle weakness (generalized) (M62.81);Hemiplegia and hemiparesis Hemiplegia - Right/Left: Right Hemiplegia - dominant/non-dominant: Dominant Hemiplegia - caused by: Cerebral infarction     Time: 1430-1500 PT  Time Calculation (min) (ACUTE ONLY): 30 min  Charges:    $Therapeutic Activity: 23-37 mins PT General Charges $$ ACUTE PT VISIT: 1 Visit                     Sabra Morel, PT, DPT  Acute Rehabilitation Services         Office: [phone]      Sabra MARLA Morel 01/31/2024, 3:13 PM

## 2024-01-31 NOTE — Progress Notes (Signed)
 IP rehab admissions - I met with patient and her husband at the bedside.  Confirmed that patient has [facility] through husbands retirement.  I gave them rehab booklets and explained inpatient rehab program.  They are interested in CIR.  Husband is available 24/7 for assist at home as needed.  I will have a partner follow up on Monday.  [phone]

## 2024-02-01 DIAGNOSIS — R2990 Unspecified symptoms and signs involving the nervous system: Secondary | ICD-10-CM | POA: Diagnosis not present

## 2024-02-01 DIAGNOSIS — E119 Type 2 diabetes mellitus without complications: Secondary | ICD-10-CM | POA: Diagnosis not present

## 2024-02-01 DIAGNOSIS — E66813 Obesity, class 3: Secondary | ICD-10-CM | POA: Diagnosis not present

## 2024-02-01 DIAGNOSIS — E785 Hyperlipidemia, unspecified: Secondary | ICD-10-CM | POA: Diagnosis not present

## 2024-02-01 DIAGNOSIS — E1165 Type 2 diabetes mellitus with hyperglycemia: Secondary | ICD-10-CM | POA: Diagnosis not present

## 2024-02-01 DIAGNOSIS — I6389 Other cerebral infarction: Secondary | ICD-10-CM | POA: Diagnosis not present

## 2024-02-01 DIAGNOSIS — I10 Essential (primary) hypertension: Secondary | ICD-10-CM | POA: Diagnosis not present

## 2024-02-01 LAB — GLUCOSE, CAPILLARY
Glucose-Capillary: 141 mg/dL — ABNORMAL HIGH (ref 70–99)
Glucose-Capillary: 155 mg/dL — ABNORMAL HIGH (ref 70–99)
Glucose-Capillary: 228 mg/dL — ABNORMAL HIGH (ref 70–99)
Glucose-Capillary: 184 mg/dL — ABNORMAL HIGH (ref 70–99)

## 2024-02-01 MED ORDER — BISACODYL 10 MG RE SUPP
10.0000 mg | Freq: Every day | RECTAL | Status: DC | PRN
  Administered 2024-02-02: 10 mg via RECTAL
  Filled 2024-02-01: qty 1

## 2024-02-01 MED ORDER — SENNOSIDES-DOCUSATE SODIUM 8.6-50 MG PO TABS
2.0000 | ORAL_TABLET | Freq: Every day | ORAL | Status: DC
  Administered 2024-02-01 – 2024-02-04 (×4): 2 via ORAL
  Filled 2024-02-01 (×4): qty 2

## 2024-02-01 MED ORDER — POLYETHYLENE GLYCOL 3350 17 G PO PACK
17.0000 g | PACK | Freq: Every day | ORAL | Status: DC
  Administered 2024-02-01 – 2024-02-05 (×3): 17 g via ORAL
  Filled 2024-02-01 (×4): qty 1

## 2024-02-01 MED ORDER — ORAL CARE MOUTH RINSE: 15.0000 mL | OROMUCOSAL | Status: DC | PRN

## 2024-02-01 MED ORDER — SODIUM CHLORIDE 0.9 % IV SOLN: INTRAVENOUS | Status: DC

## 2024-02-01 NOTE — Progress Notes (Signed)
 "                        PROGRESS NOTE        PATIENT DETAILS Name: Marisa [name] Age: 64 y.o. Sex: female Date of Birth: 07-17-1960 Admit Date: 01/28/2024 Admitting Physician Amy LOISE Free, DO ERE:[email or web address], Catheryn, MD  Brief Summary: Patient is a 64 y.o.  female with history of HTN, HLD, DM-2-presented with right sided weakness-found to have acute CVA.  Significant events: 12/31>> admit to [facility]  Significant studies: 12/02>> A1c: 7.2 12/31>> CTA head/neck: No LVO-or significant stenosis. 01/01>> MRI brain: Left basal ganglia infarct 01/01>> UDS: Negative 01/02>> LDL: 145 01/02>> echo: EF 39-34%, grade 1 diastolic dysfunction 01/03>> TCD with bubble study: No evidence of right-to-left shunt.  Significant microbiology data: None  Procedures: None  Consults: Neurology  Subjective: Appears unchanged-sleeping comfortably-easily arouses-has unchanged right-sided hemiplegia.  Objective: Vitals: Blood pressure 103/64, pulse 61, temperature 97.6 F (36.4 C), temperature source Oral, resp. rate 18, height 5' (1.524 m), weight 111.1 kg, SpO2 96%.   Exam: Right facial droop Unchanged right-sided hemiplegia.  Pertinent Labs/Radiology:    Latest Ref Rng & Units 01/30/2024    4:46 AM 01/28/2024   10:53 PM 12/30/2023   11:43 AM  CBC  WBC 4.0 - 10.5 K/uL 7.3  8.6  7.0   Hemoglobin 12.0 - 15.0 g/dL 86.1  86.9  86.4   Hematocrit 36.0 - 46.0 % 41.7  40.2  44.9   Platelets 150 - 400 K/uL 281  273  304     Lab Results  Component Value Date   NA 137 01/30/2024   K 3.6 01/30/2024   CL 100 01/30/2024   CO2 23 01/30/2024      Assessment/Plan: Acute CVA Likely small vessel disease versus cardioembolic Unchanged facial droop/right-sided hemiplegia Workup as above On aspirin /Plavix  On high intensity statin Cardiology consulted 1/4 for TEE CIR planned on discharge  HTN Initially permissive hypertension was allowed Antihypertensives has been gradually started BP now  stable-continue amlodipine /chlorthalidone /Avapro  and Bystolic . Follow/optimize  DM-2 Continue SSI  Recent Labs    01/31/24 1634 01/31/24 2135 02/01/24 0756  GLUCAP 135* 182* 141*    HLD Statin  OSA CPAP nightly  2.2 cm nodule left thyroid Incidental finding on CTA head/neck Stable for outpatient nonemergent thyroid ultrasound-as this is an incidental finding.  Class III obesity: Estimated body mass index is 47.85 kg/m as calculated from the following:   Height as of this encounter: 5' (1.524 m).   Weight as of this encounter: 111.1 kg.   Code status:   Code Status: Full Code   DVT Prophylaxis: enoxaparin  (LOVENOX ) injection 40 mg Start: 01/30/24 1000 SCD's Start: 01/29/24 1047 Place and maintain sequential compression device Start: 01/29/24 0046 Place TED hose Start: 01/29/24 0046   Family Communication: Son at bedside   Disposition Plan: Status is: Observation The patient will require care spanning > 2 midnights and should be moved to inpatient because: Severity of illness   Planned Discharge Destination:Skilled nursing facility versus CIR   Diet: Diet Order             Diet NPO time specified Except for: Sips with Meds  Diet effective midnight           Diet heart healthy/carb modified Room service appropriate? Yes; Fluid consistency: Thin  Diet effective now                     Antimicrobial  agents: Anti-infectives (From admission, onward)    None        MEDICATIONS: Scheduled Meds:  amLODipine   5 mg Oral Daily   aspirin  EC  81 mg Oral Daily   atorvastatin   80 mg Oral Once per day on Monday Tuesday Wednesday Thursday Friday Saturday   chlorthalidone   25 mg Oral Daily   clopidogrel   75 mg Oral Daily   dapagliflozin  propanediol  10 mg Oral Daily   enoxaparin  (LOVENOX ) injection  40 mg Subcutaneous Q24H   folic acid   1 mg Oral Daily   insulin  aspart  0-20 Units Subcutaneous TID WC   insulin  aspart  0-5 Units Subcutaneous QHS    irbesartan   37.5 mg Oral Daily   nebivolol   10 mg Oral Daily   Continuous Infusions: PRN Meds:.acetaminophen  **OR** acetaminophen  (TYLENOL ) oral liquid 160 mg/5 mL **OR** acetaminophen , hydrALAZINE , senna-docusate   I have personally reviewed following labs and imaging studies  LABORATORY DATA: CBC: Recent Labs  Lab 01/28/24 2253 01/30/24 0446  WBC 8.6 7.3  NEUTROABS 3.6  --   HGB 13.0 13.8  HCT 40.2 41.7  MCV 68.6* 68.4*  PLT 273 281    Basic Metabolic Panel: Recent Labs  Lab 01/28/24 2253 01/30/24 0446  NA 140 137  K 3.1* 3.6  CL 100 100  CO2 27 23  GLUCOSE 132* 141*  BUN 12 11  CREATININE 0.79 0.74  CALCIUM  9.3 9.4    GFR: Estimated Creatinine Clearance: 80.4 mL/min (by C-G formula based on SCr of 0.74 mg/dL).  Liver Function Tests: Recent Labs  Lab 01/28/24 2253  AST 17  ALT 10  ALKPHOS 95  BILITOT 0.5  PROT 7.8  ALBUMIN 4.2   No results for input(s): LIPASE, AMYLASE in the last 168 hours. No results for input(s): AMMONIA in the last 168 hours.  Coagulation Profile: Recent Labs  Lab 01/28/24 2253  INR 1.0    Cardiac Enzymes: No results for input(s): CKTOTAL, CKMB, CKMBINDEX, TROPONINI in the last 168 hours.  BNP (last 3 results) No results for input(s): PROBNP in the last 8760 hours.  Lipid Profile: Recent Labs    01/30/24 0446  CHOL 214*  HDL 45  LDLCALC 145*  TRIG 121  CHOLHDL 4.7    Thyroid Function Tests: No results for input(s): TSH, T4TOTAL, FREET4, T3FREE, THYROIDAB in the last 72 hours.  Anemia Panel: No results for input(s): VITAMINB12, FOLATE, FERRITIN, TIBC, IRON, RETICCTPCT in the last 72 hours.  Urine analysis:    Component Value Date/Time   COLORURINE STRAW (A) 01/29/2024 0005   APPEARANCEUR CLEAR 01/29/2024 0005   LABSPEC 1.010 01/29/2024 0005   PHURINE 7.0 01/29/2024 0005   GLUCOSEU NEGATIVE 01/29/2024 0005   HGBUR NEGATIVE 01/29/2024 0005   BILIRUBINUR NEGATIVE  01/29/2024 0005   BILIRUBINUR negative 11/26/2023 1636   BILIRUBINUR Negative 10/21/2022 1516   KETONESUR NEGATIVE 01/29/2024 0005   PROTEINUR NEGATIVE 01/29/2024 0005   UROBILINOGEN 0.2 11/26/2023 1636   UROBILINOGEN 0.2 07/31/2012 2358   NITRITE NEGATIVE 01/29/2024 0005   LEUKOCYTESUR NEGATIVE 01/29/2024 0005    Sepsis Labs: Lactic Acid, Venous    Component Value Date/Time   LATICACIDVEN 1.15 02/17/2015 0057    MICROBIOLOGY: No results found for this or any previous visit (from the past 240 hours).  RADIOLOGY STUDIES/RESULTS: VAS US  TRANSCRANIAL DOPPLER W BUBBLES Result Date: 01/31/2024  Transcranial Doppler with Bubble Patient Name:  Marisa [name]  Date of Exam:   01/31/2024 Medical Rec #: [id number]  Accession #:    [id number] Date of Birth: 1960/09/05            Patient Gender: F Patient Age:   34 years Exam Location:  [facility] Procedure:      VAS US  TRANSCRANIAL DOPPLER W BUBBLES Referring Phys: ROCKY LIKES --------------------------------------------------------------------------------  Indications: Stroke. Limitations for diagnostic windows: Unable to insonate bilateral transtemporal or orbital windows. Comparison Study: No prior study on file Performing Technologist: Alberta Lis RVS  Examination Guidelines: A complete evaluation includes B-mode imaging, spectral Doppler, color Doppler, and power Doppler as needed of all accessible portions of each vessel. Bilateral testing is considered an integral part of a complete examination. Limited examinations for reoccurring indications may be performed as noted.  Summary: No HITS at rest or during Valsalva. Negative transcranial Doppler Bubble study with no evidence of right to left intracardiac communication.  A vascular evaluation was performed. The vertebral was studied. An IV was inserted into the patient's right Forearm. Verbal informed consent was obtained.     Preliminary    ECHOCARDIOGRAM COMPLETE Result  Date: 01/30/2024    ECHOCARDIOGRAM REPORT   Patient Name:   Marisa [name] Date of Exam: 01/30/2024 Medical Rec #:  [id number]            Height:       60.0 in Accession #:    [id number]           Weight:       245.0 lb Date of Birth:  05-13-1960           BSA:          2.035 m Patient Age:    63 years             BP:           167/92 mmHg Patient Gender: F                    HR:           71 bpm. Exam Location:  Inpatient Procedure: 2D Echo, Cardiac Doppler and Color Doppler (Both Spectral and Color            Flow Doppler were utilized during procedure). Indications:    Stroke and TIA  History:        Patient has prior history of Echocardiogram examinations, most                 recent 04/16/2022. Risk Factors:Hypertension and Diabetes.  Sonographer:    Sherlean Dubin Referring Phys: [id number] AMY N COX  Sonographer Comments: Image acquisition challenging due to patient body habitus and Image acquisition challenging due to respiratory motion. IMPRESSIONS  1. Left ventricular ejection fraction, by estimation, is 65 to 70%. The left ventricle has normal function. The left ventricle has no regional wall motion abnormalities. There is mild concentric left ventricular hypertrophy. Left ventricular diastolic parameters are consistent with Grade I diastolic dysfunction (impaired relaxation).  2. Right ventricular systolic function is normal. The right ventricular size is normal.  3. The mitral valve is normal in structure. No evidence of mitral valve regurgitation. No evidence of mitral stenosis.  4. The aortic valve is normal in structure. Aortic valve regurgitation is not visualized. No aortic stenosis is present.  5. The inferior vena cava is normal in size with greater than 50% respiratory variability, suggesting right atrial pressure of 3 mmHg. FINDINGS  Left Ventricle: Left ventricular ejection fraction, by estimation, is 65 to 70%. The  left ventricle has normal function. The left ventricle has no regional wall  motion abnormalities. The left ventricular internal cavity size was normal in size. There is  mild concentric left ventricular hypertrophy. Left ventricular diastolic parameters are consistent with Grade I diastolic dysfunction (impaired relaxation). Right Ventricle: The right ventricular size is normal. No increase in right ventricular wall thickness. Right ventricular systolic function is normal. Left Atrium: Left atrial size was normal in size. Right Atrium: Right atrial size was normal in size. Pericardium: There is no evidence of pericardial effusion. Mitral Valve: The mitral valve is normal in structure. No evidence of mitral valve regurgitation. No evidence of mitral valve stenosis. MV peak gradient, 3.3 mmHg. The mean mitral valve gradient is 1.0 mmHg. Tricuspid Valve: The tricuspid valve is normal in structure. Tricuspid valve regurgitation is trivial. No evidence of tricuspid stenosis. Aortic Valve: The aortic valve is normal in structure. Aortic valve regurgitation is not visualized. No aortic stenosis is present. Aortic valve mean gradient measures 3.5 mmHg. Aortic valve peak gradient measures 6.2 mmHg. Aortic valve area, by VTI measures 2.20 cm. Pulmonic Valve: The pulmonic valve was normal in structure. Pulmonic valve regurgitation is not visualized. No evidence of pulmonic stenosis. Aorta: The aortic root is normal in size and structure. Venous: The inferior vena cava is normal in size with greater than 50% respiratory variability, suggesting right atrial pressure of 3 mmHg. IAS/Shunts: No atrial level shunt detected by color flow Doppler.  LEFT VENTRICLE PLAX 2D LVIDd:         4.70 cm   Diastology LVIDs:         3.50 cm   LV e' medial:    4.20 cm/s LV PW:         1.10 cm   LV E/e' medial:  12.9 LV IVS:        1.00 cm   LV e' lateral:   5.44 cm/s LVOT diam:     2.00 cm   LV E/e' lateral: 9.9 LV SV:         58 LV SV Index:   29 LVOT Area:     3.14 cm  RIGHT VENTRICLE             IVC RV Basal diam:   3.60 cm     IVC diam: 1.30 cm RV Mid diam:    3.30 cm RV S prime:     13.80 cm/s TAPSE (M-mode): 2.1 cm LEFT ATRIUM           Index        RIGHT ATRIUM           Index LA diam:      2.50 cm 1.23 cm/m   RA Area:     16.10 cm LA Vol (A2C): 53.1 ml 26.10 ml/m  RA Volume:   39.40 ml  19.36 ml/m LA Vol (A4C): 43.5 ml 21.38 ml/m  AORTIC VALVE AV Area (Vmax):    2.45 cm AV Area (Vmean):   2.29 cm AV Area (VTI):     2.20 cm AV Vmax:           124.50 cm/s AV Vmean:          84.500 cm/s AV VTI:            0.265 m AV Peak Grad:      6.2 mmHg AV Mean Grad:      3.5 mmHg LVOT Vmax:         97.25 cm/s LVOT  Vmean:        61.700 cm/s LVOT VTI:          0.186 m LVOT/AV VTI ratio: 0.70  AORTA Ao Root diam: 2.70 cm Ao Asc diam:  3.20 cm MITRAL VALVE               TRICUSPID VALVE MV Area (PHT): 2.56 cm    TR Peak grad:   9.1 mmHg MV Area VTI:   2.58 cm    TR Vmax:        151.00 cm/s MV Peak grad:  3.3 mmHg MV Mean grad:  1.0 mmHg    SHUNTS MV Vmax:       0.91 m/s    Systemic VTI:  0.19 m MV Vmean:      52.6 cm/s   Systemic Diam: 2.00 cm MV Decel Time: 296 msec MR Peak grad: 6.4 mmHg MR Vmax:      126.00 cm/s MV E velocity: 54.00 cm/s MV A velocity: 78.40 cm/s MV E/A ratio:  0.69 Morene Brownie Electronically signed by Morene Brownie Signature Date/Time: 01/30/2024/11:10:46 AM    Final      LOS: 2 days   Donalda Applebaum, MD  [facility]    To contact the attending provider between 7A-7P or the covering provider during after hours 7P-7A, please log into the web site [email or web address] and access using universal [place] password for that web site. If you do not have the password, please call the hospital operator.  02/01/2024, 9:50 AM    "

## 2024-02-01 NOTE — Progress Notes (Signed)
"         STROKE TEAM PROGRESS NOTE  SUBJECTIVE : Her father is at the bedside.  Patient continues to have dense right hemiplegia and facial droop.  Neurological exam is unchanged.  Await transfer to inpatient rehab  OBJECTIVE:   Vital signs : Blood pressure (!)  159/85, pulse 68, temperature 98.8 F (37.1 C), temperature source Oral, resp. rate 18, height 5' (1.524 m), weight 111.1 kg, SpO2 97%.  Physical exam  . Afebrile. Head is nontraumatic. Neck is supple without bruit.    Cardiac exam no murmur or gallop. Lungs are clear to auscultation. Distal pulses are well felt.  Neurological exam :   Awake  Alert oriented x 3. Normal speech and language.eye movements full without nystagmus.fundi were not visualized. Vision acuity and fields appear normal. Hearing is normal. Palatal movements are normal. Face symmetric. Tongue midline. Normal strength, tone, reflexes and coordination. Normal sensation. Gait deferred. No results found.   CT head no acute abnormality MRI acute infarct left basal ganglia with mild associated edema CT angio no large vessel occlusion proximal Echo EF 65 to 70%.  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  Vascular Risk Factors: Morbid obesity, diabetes, hypertension, hyperlipidemia at risk for sleep apnea  PLAN: Continue aspirin  and Plavix  for stroke prevention for 3 weeks followed by Plavix  alone.  Check TEE for cardiac source of embolism.  Continue telemetry monitoring.  Mobilize out of bed.  Continue ongoing physical Occupational Therapy consults.  Likely transfer to inpatient rehab in a few days after insurance approval. Long discussion with patient and family at the bedside and answered questions.  Discussed with Dr.Ghimire   I personally spent a total of 25 minutes in the care of the patient today including getting/reviewing  separately obtained history, performing a medically appropriate exam/evaluation, counseling and educating, placing orders, referring and communicating with other health care professionals, documenting clinical information in the EHR, independently interpreting results, and coordinating care.         Eather Popp, MD Medical Director [facility] Pager: [phone] 02/01/2024 1:03 PM  "

## 2024-02-01 NOTE — Progress Notes (Signed)
"  ° °  [facility] has been requested to perform a transesophageal echocardiogram on Marisa [name] for acute CVA.  After careful review of history and examination, the risks and benefits of transesophageal echocardiogram have been explained including risks of esophageal damage, perforation (1:10,000 risk), bleeding, pharyngeal hematoma as well as other potential complications associated with anesthesia including aspiration, arrhythmia, respiratory failure and death. Alternatives to treatment were discussed, questions were answered. Patient is willing to proceed.   64 year old female with past medical history of hypertension, hyperlipidemia, DM2 and morbid obesity who presented with acute CVA.  Vital signs stable.  Normal red blood cell count.  Normal platelet level.  [name], [place] 02/01/2024 5:12 PM   "

## 2024-02-01 NOTE — Plan of Care (Signed)
" °  Problem: Education: Goal: Knowledge of disease or condition will improve Outcome: Progressing Goal: Knowledge of patient specific risk factors will improve (DELETE if not current risk factor) Outcome: Progressing   Problem: Ischemic Stroke/TIA Tissue Perfusion: Goal: Complications of ischemic stroke/TIA will be minimized Outcome: Progressing   Problem: Health Behavior/Discharge Planning: Goal: Goals will be collaboratively established with patient/family 02/01/2024 1601 by Berkeley Verdie BRAVO, RN Outcome: Progressing 02/01/2024 1510 by Berkeley Verdie BRAVO, RN Outcome: Progressing   "

## 2024-02-01 NOTE — Plan of Care (Signed)
" °  Problem: Education: Goal: Knowledge of disease or condition will improve Outcome: Progressing Goal: Knowledge of patient specific risk factors will improve (DELETE if not current risk factor) Outcome: Progressing   Problem: Coping: Goal: Will verbalize positive feelings about self Outcome: Progressing   Problem: Health Behavior/Discharge Planning: Goal: Goals will be collaboratively established with patient/family Outcome: Progressing   "

## 2024-02-02 ENCOUNTER — Inpatient Hospital Stay (HOSPITAL_COMMUNITY)

## 2024-02-02 ENCOUNTER — Encounter (HOSPITAL_COMMUNITY): Payer: Self-pay | Admitting: Internal Medicine

## 2024-02-02 ENCOUNTER — Other Ambulatory Visit: Payer: Self-pay

## 2024-02-02 ENCOUNTER — Encounter (HOSPITAL_COMMUNITY)
Admission: EM | Disposition: A | Discharge status: Rehabilitation Facility | Payer: Self-pay | Source: Home / Self Care | Attending: Internal Medicine

## 2024-02-02 DIAGNOSIS — I639 Cerebral infarction, unspecified: Secondary | ICD-10-CM | POA: Diagnosis not present

## 2024-02-02 DIAGNOSIS — E119 Type 2 diabetes mellitus without complications: Secondary | ICD-10-CM

## 2024-02-02 DIAGNOSIS — E785 Hyperlipidemia, unspecified: Secondary | ICD-10-CM | POA: Diagnosis not present

## 2024-02-02 DIAGNOSIS — I10 Essential (primary) hypertension: Secondary | ICD-10-CM

## 2024-02-02 DIAGNOSIS — E66813 Obesity, class 3: Secondary | ICD-10-CM | POA: Diagnosis not present

## 2024-02-02 DIAGNOSIS — G4733 Obstructive sleep apnea (adult) (pediatric): Secondary | ICD-10-CM | POA: Diagnosis not present

## 2024-02-02 DIAGNOSIS — E1165 Type 2 diabetes mellitus with hyperglycemia: Secondary | ICD-10-CM | POA: Diagnosis not present

## 2024-02-02 DIAGNOSIS — I071 Rheumatic tricuspid insufficiency: Secondary | ICD-10-CM

## 2024-02-02 DIAGNOSIS — E1151 Type 2 diabetes mellitus with diabetic peripheral angiopathy without gangrene: Secondary | ICD-10-CM

## 2024-02-02 DIAGNOSIS — R2990 Unspecified symptoms and signs involving the nervous system: Secondary | ICD-10-CM | POA: Diagnosis not present

## 2024-02-02 DIAGNOSIS — I6389 Other cerebral infarction: Secondary | ICD-10-CM | POA: Diagnosis not present

## 2024-02-02 DIAGNOSIS — G4730 Sleep apnea, unspecified: Secondary | ICD-10-CM

## 2024-02-02 HISTORY — PX: TRANSESOPHAGEAL ECHOCARDIOGRAM (CATH LAB): EP1270

## 2024-02-02 LAB — ECHO TEE
AV Peak grad: 3.3 mmHg
Ao pk vel: 0.91 m/s

## 2024-02-02 LAB — GLUCOSE, CAPILLARY
Glucose-Capillary: 133 mg/dL — ABNORMAL HIGH (ref 70–99)
Glucose-Capillary: 147 mg/dL — ABNORMAL HIGH (ref 70–99)
Glucose-Capillary: 189 mg/dL — ABNORMAL HIGH (ref 70–99)
Glucose-Capillary: 176 mg/dL — ABNORMAL HIGH (ref 70–99)

## 2024-02-02 MED ORDER — PROPOFOL 10 MG/ML IV BOLUS
INTRAVENOUS | Status: DC | PRN
  Administered 2024-02-02: 50 mg via INTRAVENOUS
  Administered 2024-02-02: 20 mg via INTRAVENOUS
  Administered 2024-02-02: 80 mg via INTRAVENOUS

## 2024-02-02 MED ORDER — LIDOCAINE 2% (20 MG/ML) 5 ML SYRINGE
INTRAMUSCULAR | Status: DC | PRN
  Administered 2024-02-02: 100 mg via INTRAVENOUS

## 2024-02-02 NOTE — Progress Notes (Signed)
 Inpatient Rehab Admissions Coordinator:    CIR following. I am not sure CIR is able to accept payor [facility]). I am working to see if this is possible and will update when I have an answer.   Leita Kleine, MS, CCC-SLP Rehab Admissions Coordinator  [phone] (celll) [phone] (office)

## 2024-02-02 NOTE — Progress Notes (Signed)
 "                        PROGRESS NOTE        PATIENT DETAILS Name: Marisa [name] Age: 64 y.o. Sex: female Date of Birth: [birth year] Admit Date: 01/28/2024 Admitting Physician Amy LOISE Free, DO ERE:[email or web address], Catheryn, MD  Brief Summary: Patient is a 64 y.o.  female with history of HTN, HLD, DM-2-presented with right sided weakness-found to have acute CVA.  Significant events: 12/31>> admit to [facility]  Significant studies: 12/02>> A1c: 7.2 12/31>> CTA head/neck: No LVO-or significant stenosis. 01/01>> MRI brain: Left basal ganglia infarct 01/01>> UDS: Negative 01/02>> LDL: 145 01/02>> echo: EF 39-34%, grade 1 diastolic dysfunction 01/03>> TCD with bubble study: No evidence of right-to-left shunt. 01/05>> TEE: Negative bubble study-no thrombus.  Significant microbiology data: None  Procedures: None  Consults: Neurology  Subjective: No acute major issues overnight-unchanged right-sided hemiplegia.  Objective: Vitals: Blood pressure 127/61, pulse 61, temperature 98.2 F (36.8 C), temperature source Temporal, resp. rate (!) 24, height 5' (1.524 m), weight 111.1 kg, SpO2 93%.   Exam: Awake/alert Right facial droop Unchanged right-sided hemiplegia.  Pertinent Labs/Radiology:    Latest Ref Rng & Units 01/30/2024    4:46 AM 01/28/2024   10:53 PM 12/30/2023   11:43 AM  CBC  WBC 4.0 - 10.5 K/uL 7.3  8.6  7.0   Hemoglobin 12.0 - 15.0 g/dL 86.1  86.9  86.4   Hematocrit 36.0 - 46.0 % 41.7  40.2  44.9   Platelets 150 - 400 K/uL 281  273  304     Lab Results  Component Value Date   NA 137 01/30/2024   K 3.6 01/30/2024   CL 100 01/30/2024   CO2 23 01/30/2024      Assessment/Plan: Acute CVA Likely small vessel disease versus cardioembolic Unchanged facial droop/right-sided hemiplegia Workup as above On aspirin /Plavix  On high intensity statin TEE negative for thrombus-negative bubble study CIR planned on discharge  HTN Initially permissive hypertension was  allowed Antihypertensives has been gradually started BP now stable-continue amlodipine /chlorthalidone /Avapro  and Bystolic . Follow/optimize  DM-2 Continue SSI  Recent Labs    02/01/24 1548 02/01/24 2113 02/02/24 0744  GLUCAP 228* 184* 133*    HLD Statin  OSA CPAP nightly  2.2 cm nodule left thyroid Incidental finding on CTA head/neck Stable for outpatient nonemergent thyroid ultrasound-as this is an incidental finding.  Class III obesity: Estimated body mass index is 47.85 kg/m as calculated from the following:   Height as of this encounter: 5' (1.524 m).   Weight as of this encounter: 111.1 kg.   Code status:   Code Status: Full Code   DVT Prophylaxis: enoxaparin  (LOVENOX ) injection 40 mg Start: 01/30/24 1000 SCD's Start: 01/29/24 1047 Place and maintain sequential compression device Start: 01/29/24 0046 Place TED hose Start: 01/29/24 0046   Family Communication: Son at bedside   Disposition Plan: Status is: Observation The patient will require care spanning > 2 midnights and should be moved to inpatient because: Severity of illness   Planned Discharge Destination:Skilled nursing facility versus CIR   Diet: Diet Order             Diet NPO time specified Except for: Sips with Meds  Diet effective midnight                     Antimicrobial agents: Anti-infectives (From admission, onward)    None  MEDICATIONS: Scheduled Meds:  [MAR Hold] amLODipine   5 mg Oral Daily   [MAR Hold] aspirin  EC  81 mg Oral Daily   [MAR Hold] atorvastatin   80 mg Oral Once per day on Monday Tuesday Wednesday Thursday Friday Saturday   [facility] Hold] chlorthalidone   25 mg Oral Daily   [MAR Hold] clopidogrel   75 mg Oral Daily   [MAR Hold] dapagliflozin  propanediol  10 mg Oral Daily   [MAR Hold] enoxaparin  (LOVENOX ) injection  40 mg Subcutaneous Q24H   [MAR Hold] folic acid   1 mg Oral Daily   [MAR Hold] insulin  aspart  0-20 Units Subcutaneous TID WC   [MAR Hold]  insulin  aspart  0-5 Units Subcutaneous QHS   [MAR Hold] irbesartan   37.5 mg Oral Daily   [MAR Hold] nebivolol   10 mg Oral Daily   [MAR Hold] polyethylene glycol  17 g Oral Daily   [MAR Hold] senna-docusate  2 tablet Oral QHS   Continuous Infusions:  sodium chloride      PRN Meds:.[MAR Hold] acetaminophen  **OR** [MAR Hold] acetaminophen  (TYLENOL ) oral liquid 160 mg/5 mL **OR** [MAR Hold] acetaminophen , [MAR Hold] bisacodyl , [MAR Hold] hydrALAZINE , [MAR Hold] mouth rinse   I have personally reviewed following labs and imaging studies  LABORATORY DATA: CBC: Recent Labs  Lab 01/28/24 2253 01/30/24 0446  WBC 8.6 7.3  NEUTROABS 3.6  --   HGB 13.0 13.8  HCT 40.2 41.7  MCV 68.6* 68.4*  PLT 273 281    Basic Metabolic Panel: Recent Labs  Lab 01/28/24 2253 01/30/24 0446  NA 140 137  K 3.1* 3.6  CL 100 100  CO2 27 23  GLUCOSE 132* 141*  BUN 12 11  CREATININE 0.79 0.74  CALCIUM  9.3 9.4    GFR: Estimated Creatinine Clearance: 80.4 mL/min (by C-G formula based on SCr of 0.74 mg/dL).  Liver Function Tests: Recent Labs  Lab 01/28/24 2253  AST 17  ALT 10  ALKPHOS 95  BILITOT 0.5  PROT 7.8  ALBUMIN 4.2   No results for input(s): LIPASE, AMYLASE in the last 168 hours. No results for input(s): AMMONIA in the last 168 hours.  Coagulation Profile: Recent Labs  Lab 01/28/24 2253  INR 1.0    Cardiac Enzymes: No results for input(s): CKTOTAL, CKMB, CKMBINDEX, TROPONINI in the last 168 hours.  BNP (last 3 results) No results for input(s): PROBNP in the last 8760 hours.  Lipid Profile: No results for input(s): CHOL, HDL, LDLCALC, TRIG, CHOLHDL, LDLDIRECT in the last 72 hours.   Thyroid Function Tests: No results for input(s): TSH, T4TOTAL, FREET4, T3FREE, THYROIDAB in the last 72 hours.  Anemia Panel: No results for input(s): VITAMINB12, FOLATE, FERRITIN, TIBC, IRON, RETICCTPCT in the last 72 hours.  Urine  analysis:    Component Value Date/Time   COLORURINE STRAW (A) 01/29/2024 0005   APPEARANCEUR CLEAR 01/29/2024 0005   LABSPEC 1.010 01/29/2024 0005   PHURINE 7.0 01/29/2024 0005   GLUCOSEU NEGATIVE 01/29/2024 0005   HGBUR NEGATIVE 01/29/2024 0005   BILIRUBINUR NEGATIVE 01/29/2024 0005   BILIRUBINUR negative 11/26/2023 1636   BILIRUBINUR Negative 10/21/2022 1516   KETONESUR NEGATIVE 01/29/2024 0005   PROTEINUR NEGATIVE 01/29/2024 0005   UROBILINOGEN 0.2 11/26/2023 1636   UROBILINOGEN 0.2 07/31/2012 2358   NITRITE NEGATIVE 01/29/2024 0005   LEUKOCYTESUR NEGATIVE 01/29/2024 0005    Sepsis Labs: Lactic Acid, Venous    Component Value Date/Time   LATICACIDVEN 1.15 02/17/2015 0057    MICROBIOLOGY: No results found for this or any previous visit (from the  past 240 hours).  RADIOLOGY STUDIES/RESULTS: EP STUDY Result Date: 02/02/2024 See surgical note for result.  VAS US  TRANSCRANIAL DOPPLER W BUBBLES Result Date: 02/01/2024  Transcranial Doppler with Bubble Patient Name:  LADAN VANDERZANDEN Orrick  Date of Exam:   01/31/2024 Medical Rec #: [id number]             Accession #:    [id number] Date of Birth: 11/14/1960            Patient Gender: F Patient Age:   35 years Exam Location:  [facility] Procedure:      VAS US  TRANSCRANIAL DOPPLER W BUBBLES Referring Phys: ROCKY LIKES --------------------------------------------------------------------------------  Indications: Stroke. Limitations for diagnostic windows: Unable to insonate bilateral transtemporal or orbital windows. Comparison Study: No prior study on file Performing Technologist: Alberta Lis RVS  Examination Guidelines: A complete evaluation includes B-mode imaging, spectral Doppler, color Doppler, and power Doppler as needed of all accessible portions of each vessel. Bilateral testing is considered an integral part of a complete examination. Limited examinations for reoccurring indications may be performed as noted.  Summary: No  HITS at rest or during Valsalva. Negative transcranial Doppler Bubble study with no evidence of right to left intracardiac communication.  A vascular evaluation was performed. The vertebral was studied. An IV was inserted into the patient's right Forearm. Verbal informed consent was obtained.  NEGATIVE TCD Bubble study with no evidence of right to left shunt noted.   Diagnosing physician: Eather Popp MD Electronically signed by Eather Popp MD on 02/01/2024 at 10:38:12 AM.    Final      LOS: 3 days   Donalda Applebaum, MD  [facility]    To contact the attending provider between 7A-7P or the covering provider during after hours 7P-7A, please log into the web site [email or web address] and access using universal [place] password for that web site. If you do not have the password, please call the hospital operator.  02/02/2024, 11:19 AM    "

## 2024-02-02 NOTE — TOC Initial Note (Signed)
 Transition of Care [facility]) - Initial/Assessment Note    Patient Details  Name: Marisa [name] MRN: [id number] Date of Birth: 13-Feb-1960  [facility]) CM/SW Contact:    Inocente GORMAN Kindle, LCSW Phone Number: 02/02/2024, 8:54 AM  Clinical Narrative:                 Patient admitted from home with spouse undergoing stroke workup. Inpatient Care Management following for inpatient rehab determination of candidacy.     Expected Discharge Plan: IP Rehab Facility Barriers to Discharge: English As A Second Language Teacher, Continued Medical Work up   Patient Goals and CMS Choice Patient states their goals for this hospitalization and ongoing recovery are:: Rehab          Expected Discharge Plan and Services In-house Referral: Clinical Social Work   Post Acute Care Choice: IP Rehab Living arrangements for the past 2 months: Single Family Home                                      Prior Living Arrangements/Services Living arrangements for the past 2 months: Single Family Home Lives with:: Spouse Patient language and need for interpreter reviewed:: Yes Do you feel safe going back to the place where you live?: Yes      Need for Family Participation in Patient Care: Yes (Comment) Care giver support system in place?: Yes (comment)   Criminal Activity/Legal Involvement Pertinent to Current Situation/Hospitalization: No - Comment as needed  Activities of Daily Living   ADL Screening (condition at time of admission) Independently performs ADLs?: No Does the patient have a NEW difficulty with bathing/dressing/toileting/self-feeding that is expected to last >3 days?: Yes (Initiates electronic notice to provider for possible OT consult) Does the patient have a NEW difficulty with getting in/out of bed, walking, or climbing stairs that is expected to last >3 days?: Yes (Initiates electronic notice to provider for possible PT consult) Does the patient have a NEW difficulty with  communication that is expected to last >3 days?: Yes (Initiates electronic notice to provider for possible SLP consult) Is the patient deaf or have difficulty hearing?: No Does the patient have difficulty seeing, even when wearing glasses/contacts?: No Does the patient have difficulty concentrating, remembering, or making decisions?: No  Permission Sought/Granted Permission sought to share information with : Facility Medical Sales Representative, Family Supports Permission granted to share information with : Yes, Verbal Permission Granted  Share Information with NAME: Grillot,Jeffrey Sr- Spouse [phone]  [phone]           Emotional Assessment Appearance:: Appears stated age     Orientation: : Oriented to Self, Oriented to Place, Oriented to  Time, Oriented to Situation Alcohol / Substance Use: Not Applicable Psych Involvement: No (comment)  Admission diagnosis:  Weakness [R53.1] Atypical squamous cells of undetermined significance on cytologic smear of cervix (ASC-US ) [R87.610] Right sided weakness [R53.1] Stroke-like symptom [R29.90] Cerebrovascular accident (CVA), unspecified mechanism (HCC) [I63.9] Patient Active Problem List   Diagnosis Date Noted   Right sided weakness 01/29/2024   Stroke-like symptom 01/29/2024   Primary osteoarthritis of left knee 09/03/2023   Facial tingling 07/08/2023   Tachycardia 05/27/2023   Skin lesion of hand 05/27/2023   Encounter for general adult medical examination w/o abnormal findings 10/21/2022   ASCUS of cervix with negative high risk HPV 10/21/2022   OSA on CPAP 08/21/2022   Chronic intermittent hypoxia with obstructive sleep apnea 10/03/2021  Snoring 10/03/2021   Sleeps in sitting position due to orthopnea 10/03/2021   Type 2 diabetes mellitus with microalbuminuria, without long-term current use of insulin  (HCC) 10/03/2021   Pure hypercholesterolemia 08/30/2020   Uncontrolled type 2 diabetes mellitus with hyperglycemia (HCC)  02/05/2018   Essential hypertension, benign 02/05/2018   Class 3 severe obesity due to excess calories with serious comorbidity and body mass index (BMI) of 45.0 to 49.9 in adult (HCC) 02/05/2018   Vitamin D  deficiency disease 10/23/2016   Foot pain, bilateral 02/08/2016   Obesity, Class III, BMI 40-49.9 (morbid obesity) (HCC) 02/08/2016   PCP:  Jarold Medici, MD Pharmacy:   [facility] - [place], [place] - [address] [place] [address] Phone: [phone] Fax: [phone]  [id number] - [place], [place] - [address] [place] [address] Phone: [phone] Fax: [phone]     Social Drivers of Health (SDOH) Social History: SDOH Screenings   Food Insecurity: No Food Insecurity (01/29/2024)  Housing: Low Risk (01/29/2024)  Transportation Needs: No Transportation Needs (01/29/2024)  Utilities: Not At Risk (01/29/2024)  Alcohol Screen: Low Risk (10/09/2023)  Depression (PHQ2-9): Low Risk (11/18/2023)  Financial Resource Strain: Low Risk (10/09/2023)  Physical Activity: Inactive (10/09/2023)  Social Connections: Socially Integrated (10/09/2023)  Stress: Stress Concern Present (10/09/2023)  Tobacco Use: Low Risk (01/28/2024)   SDOH Interventions:     Readmission Risk Interventions     No data to display

## 2024-02-02 NOTE — Plan of Care (Signed)

## 2024-02-02 NOTE — Progress Notes (Signed)
 Physical Therapy Treatment Patient Details Name: Marisa [name] MRN: [id number] DOB: November 02, 1960 Today's Date: 02/02/2024   History of Present Illness 64 y.o female presents to [facility] on 12/31 with R sided weakness and slurred speech. CT (-). 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 PMH: HTN, DM2, HLD.    PT Comments  Patient eager to participate. On reassessment of rt extremity muscle activation (in supine) noted: +RLE hip adduction; RUE shoulder extension and IR. Patient able to come to sit EOB with mod assist with HOB elevated and use of rail. She is able to maintain midline sitting without cues or UE support. Sit to stands with stedy and without (from recliner) with +2 min assist. Rt knee guarded in standing, but not buckling (?locked in hyperextension). Up in chair with chair alarm and family present on departure. Patient remains a strong candidate for post-acute inpatient therapies >3 hrs/day.    If plan is discharge home, recommend the following: Two people to help with walking and/or transfers;Two people to help with bathing/dressing/bathroom;Assistance with cooking/housework;Assistance with feeding;Direct supervision/assist for medications management;Direct supervision/assist for financial management;Assist for transportation;Help with stairs or ramp for entrance   Can travel by private vehicle        Equipment Recommendations  BSC/3in1;Wheelchair (measurements PT);Wheelchair cushion (measurements PT)    Recommendations for Other Services       Precautions / Restrictions Precautions Precautions: Fall Recall of Precautions/Restrictions: Intact Restrictions Weight Bearing Restrictions Per Provider Order: No     Mobility  Bed Mobility Overal bed mobility: Needs Assistance Bed Mobility: Supine to Sit     Supine to sit: Mod assist, HOB elevated, Used rails     General bed mobility comments: Exit to her rt. R LE management and trunk assist. Pt uses LUE effectively on rail to  initiate side to sit    Transfers Overall transfer level: Needs assistance Equipment used: Ambulation equipment used Transfers: Sit to/from Stand, Bed to chair/wheelchair/BSC Sit to Stand: +2 physical assistance, Min assist           General transfer comment: stood from EOB with +2 min assist; from flaps with CGA; from chair with +2 HHA with +2 min assist with light blocking to no blocking of Rt knee Transfer via Lift Equipment: Stedy  Ambulation/Gait               General Gait Details: unable   Stairs             Wheelchair Mobility     Tilt Bed    Modified Rankin (Stroke Patients Only) Modified Rankin (Stroke Patients Only) Pre-Morbid Rankin Score: No symptoms Modified Rankin: Severe disability     Balance Overall balance assessment: Needs assistance Sitting-balance support: Feet supported, Bilateral upper extremity supported Sitting balance-Leahy Scale: Fair     Standing balance support: Single extremity supported, Reliant on assistive device for balance Standing balance-Leahy Scale: Poor Standing balance comment: Reliant on sara stedy and L UE support for standing balance. Lateral wt-shifting x 20                            Communication Communication Communication: Impaired Factors Affecting Communication: Difficulty expressing self;Reduced clarity of speech  Cognition Arousal: Alert Behavior During Therapy: WFL for tasks assessed/performed   PT - Cognitive impairments: No apparent impairments                         Following commands: Intact  Cueing Cueing Techniques: Verbal cues, Tactile cues, Visual cues  Exercises Other Exercises Other Exercises: Reassessment of RLE in supine with +adduction noted x 3 trials (no other muscle firing noted in supine). In standing was able to maintain Rt knee extension with close-guarding of knee Other Exercises: RUE reassessed and noted +shoulder extension and IR    General  Comments        Pertinent Vitals/Pain Pain Assessment Pain Assessment: No/denies pain    Home Living                          Prior Function            PT Goals (current goals can now be found in the care plan section) Acute Rehab PT Goals Patient Stated Goal: No additional goals stated Time For Goal Achievement: 02/13/24 Potential to Achieve Goals: Good Progress towards PT goals: Progressing toward goals    Frequency    Min 3X/week      PT Plan      Co-evaluation              AM-PAC PT 6 Clicks Mobility   Outcome Measure  Help needed turning from your back to your side while in a flat bed without using bedrails?: A Little Help needed moving from lying on your back to sitting on the side of a flat bed without using bedrails?: A Lot Help needed moving to and from a bed to a chair (including a wheelchair)?: Total Help needed standing up from a chair using your arms (e.g., wheelchair or bedside chair)?: Total Help needed to walk in hospital room?: Total Help needed climbing 3-5 steps with a railing? : Total 6 Click Score: 9    End of Session Equipment Utilized During Treatment: Gait belt Activity Tolerance: Patient tolerated treatment well Patient left: in chair;with call bell/phone within reach;with family/visitor present;with chair alarm set Nurse Communication: Mobility status;Need for lift equipment PT Visit Diagnosis: Unsteadiness on feet (R26.81);Muscle weakness (generalized) (M62.81);Hemiplegia and hemiparesis Hemiplegia - Right/Left: Right Hemiplegia - dominant/non-dominant: Dominant Hemiplegia - caused by: Cerebral infarction     Time: 8581-8551 PT Time Calculation (min) (ACUTE ONLY): 30 min  Charges:    $Therapeutic Activity: 8-22 mins $Neuromuscular Re-education: 8-22 mins PT General Charges $$ ACUTE PT VISIT: 1 Visit                      Macario RAMAN, PT Acute Rehabilitation Services  Office [phone]    Macario SHAUNNA Soja 02/02/2024, 3:16 PM

## 2024-02-02 NOTE — Progress Notes (Signed)
 PT Cancellation Note  Patient Details Name: Marisa [name] MRN: [id number] DOB: October 07, 1960   Cancelled Treatment:    Reason Eval/Treat Not Completed: Patient at procedure or test/unavailable (Pt is in CATH lab.  Will return as able.)   [name] 02/02/2024, 10:26 AM [name] M,PT Acute Rehab Services [phone]

## 2024-02-02 NOTE — Anesthesia Postprocedure Evaluation (Signed)
"   Anesthesia Post Note  Patient: Marisa [name]  Procedure(s) Performed: TRANSESOPHAGEAL ECHOCARDIOGRAM     Patient location during evaluation: Cath Lab Anesthesia Type: MAC Level of consciousness: awake Pain management: pain level controlled Vital Signs Assessment: post-procedure vital signs reviewed and stable Respiratory status: spontaneous breathing, nonlabored ventilation and respiratory function stable Cardiovascular status: blood pressure returned to baseline and stable Postop Assessment: no apparent nausea or vomiting Anesthetic complications: no   There were no known notable events for this encounter.  Last Vitals:  Vitals:   02/02/24 1531 02/02/24 2032  BP: (!) 147/84 (!) 175/83  Pulse: 70 80  Resp: 18   Temp: 36.8 C 36.9 C  SpO2: 97% 97%    Last Pain:  Vitals:   02/02/24 2032  TempSrc: Oral  PainSc: 0-No pain                 Samaa Ueda P Kalayla Shadden      "

## 2024-02-02 NOTE — Progress Notes (Signed)
 "      STROKE TEAM PROGRESS NOTE  SUBJECTIVE : Her family is at the bedside.  Patient continues to have dense right hemiplegia and mild facial droop. Mild dysarthria with hypophonia. Neurological exam is unchanged.  Await transfer to inpatient rehab  OBJECTIVE:   Vital signs : Blood pressure (!)  159/85, pulse 68, temperature 98.8 F (37.1 C), temperature source Oral, resp. rate 18, height 5' (1.524 m), weight 111.1 kg, SpO2 97%.   Physical exam Afebrile. Head is nontraumatic. Neck is supple without bruit.    Cardiac exam no murmur or gallop. Lungs are clear to auscultation. Distal pulses are well felt.  Neurological exam :   Awake  Alert oriented x 3. Hypophonia with mild dysarthria but no aphasia and following all simple questions. Orientated x 3. Eye movements full without nystagmus. fundi were not visualized. Vision acuity and fields appear normal. R mild facial droop. Tongue midline. RUE and RLE flaccid without spontaneous movement. Symmetric sensation. Left FTN intact. Gait deferred.  ECHO TEE Result Date: 02/02/2024    TRANSESOPHOGEAL ECHO REPORT   Patient Name:   Marisa [name] Rehberg Date of Exam: 02/02/2024 Medical Rec #:  [id number]            Height:       60.0 in Accession #:    [id number]           Weight:       245.0 lb Date of Birth:  [birth year]           BSA:          2.035 m Patient Age:    64 years             BP:           141/70 mmHg Patient Gender: F                    HR:           75 bpm. Exam Location:  Inpatient Procedure: Transesophageal Echo, 3D Echo, Cardiac Doppler, Color Doppler and            Saline Contrast Bubble Study (Both Spectral and Color Flow Doppler            were utilized during procedure). Indications:     Stroke  History:         Patient has prior history of Echocardiogram examinations, most                  recent 01/30/2024. TIA and Stroke; Risk Factors:Hypertension and                  Diabetes.  Sonographer:     Philomena Daring Referring Phys:  [id number] HAO MENG  Diagnosing Phys: Stanly Leavens MD PROCEDURE: After discussion of the risks and benefits of a TEE, an informed consent was obtained from the patient. The transesophogeal probe was passed without difficulty through the esophogus of the patient. Imaged were obtained with the patient in a left lateral decubitus position. Sedation performed by different physician. The patient was monitored while under deep sedation. Anesthestetic sedation was provided intravenously by Anesthesiology: 150mg  of Propofol , 100mg  of Lidocaine . The patient developed no complications during the procedure.  IMPRESSIONS  1. Left ventricular ejection fraction, by estimation, is 60 to 65%. Left ventricular ejection fraction by 3D volume is 63 %. The left ventricle has normal function.  2. Right ventricular systolic function is normal. The right ventricular size is  normal.  3. No left atrial/left atrial appendage thrombus was detected. The LAA emptying velocity was 25 cm/s.  4. The mitral valve is normal in structure. Trivial mitral valve regurgitation. No evidence of mitral stenosis.  5. The aortic valve is tricuspid. Aortic valve regurgitation is not visualized. No aortic stenosis is present.  6. Agitated saline contrast bubble study was negative, with no evidence of any interatrial shunt.  7. 3D performed of the mitral valve and demonstrates Trivial Mitral regurgitation. FINDINGS  Left Ventricle: Left ventricular ejection fraction, by estimation, is 60 to 65%. Left ventricular ejection fraction by 3D volume is 63 %. The left ventricle has normal function. The left ventricular internal cavity size was normal in size. Right Ventricle: The right ventricular size is normal. No increase in right ventricular wall thickness. Right ventricular systolic function is normal. Left Atrium: Left atrial size was normal in size. No left atrial/left atrial appendage thrombus was detected. The LAA emptying velocity was 25 cm/s. Right Atrium: Right atrial  size was normal in size. Pericardium: There is no evidence of pericardial effusion. Mitral Valve: The mitral valve is normal in structure. Trivial mitral valve regurgitation. No evidence of mitral valve stenosis. Tricuspid Valve: Possible bifid posterior leaflet. The tricuspid valve is grossly normal. Tricuspid valve regurgitation is mild . No evidence of tricuspid stenosis. Aortic Valve: The aortic valve is tricuspid. Aortic valve regurgitation is not visualized. No aortic stenosis is present. Aortic valve peak gradient measures 3.3 mmHg. Pulmonic Valve: The pulmonic valve was grossly normal. Pulmonic valve regurgitation is not visualized. No evidence of pulmonic stenosis. Aorta: Artifact in the aorta. The aortic root, ascending aorta, aortic arch and descending aorta are all structurally normal, with no evidence of dilitation or obstruction. Pulmonary Artery: The pulmonary artery is of normal size. Venous: The left upper pulmonary vein, left lower pulmonary vein, right upper pulmonary vein and right lower pulmonary vein are normal. IAS/Shunts: No atrial level shunt detected by color flow Doppler. Agitated saline contrast was given intravenously to evaluate for intracardiac shunting. Agitated saline contrast bubble study was negative, with no evidence of any interatrial shunt. Additional Comments: 3D was performed not requiring image post processing on an independent workstation and was normal. Spectral Doppler performed.  3D Volume EF LV 3D EF:    Left ventricular ejection fraction by 3D volume is 63              %. LV 3D EDV:   64.89 ml LV 3D ESV:   24.10 ml  3D Volume EF: 3D EF:        63 % AORTIC VALVE AV Vmax:      91.00 cm/s AV Peak Grad: 3.3 mmHg Stanly Leavens MD Electronically signed by Stanly Leavens MD Signature Date/Time: 02/02/2024/11:51:44 AM    Final    EP STUDY Result Date: 02/02/2024 See surgical note for result.    CT head no acute abnormality MRI acute infarct left basal ganglia  with mild associated edema CT angio no large vessel occlusion proximal Echo EF 65 to 70%.  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 However, pt does have multiple vascular Risk Factors: Morbid obesity, diabetes, hypertension, hyperlipidemia and sleep apnea. Etiology for her stroke still more likely small vessel disease although cryptogenic infarct is in DDx.   PLAN: Continue aspirin  and Plavix  for  stroke prevention for 3 weeks followed by Plavix  alone.  Continue lipitor 80. Long term BP goal normotensive. Follow up with PCP for better DM control. Continue telemetry monitoring.  Pending CIR placement.     Neurology will sign off. Please call with questions. Pt will follow up with Dr. Chalice at [facility] in about 4 weeks. Thanks for the consult.  Ary Cummins, MD PhD Stroke Neurology 02/02/2024 2:31 PM   "

## 2024-02-02 NOTE — Interval H&P Note (Signed)
 History and Physical Interval Note:  02/02/2024 10:09 AM  Marisa [name]  has presented today for surgery, with the diagnosis of cva.  The various methods of treatment have been discussed with the patient and family. After consideration of risks, benefits and other options for treatment, the patient has consented to  Procedures: TRANSESOPHAGEAL ECHOCARDIOGRAM (N/A) as a surgical intervention.  The patient's history has been reviewed, patient examined, no change in status, stable for surgery.  I have reviewed the patient's chart and labs.  Questions were answered to the patient's satisfaction.     Felicite Zeimet A Aliya Sol

## 2024-02-02 NOTE — Anesthesia Preprocedure Evaluation (Addendum)
"                                    Anesthesia Evaluation  Patient identified by MRN, date of birth, ID band Patient awake    Reviewed: Allergy & Precautions, NPO status , Patient's Chart, lab work & pertinent test results  Airway Mallampati: III  TM Distance: >3 FB Neck ROM: Full    Dental no notable dental hx.    Pulmonary sleep apnea and Continuous Positive Airway Pressure Ventilation    Pulmonary exam normal        Cardiovascular hypertension, Pt. on medications and Pt. on home beta blockers Normal cardiovascular exam     Neuro/Psych CVA    GI/Hepatic   Endo/Other  diabetes  Class 3 obesity  Renal/GU      Musculoskeletal   Abdominal  (+) + obese  Peds  Hematology negative hematology ROS (+)   Anesthesia Other Findings cva  Reproductive/Obstetrics                              Anesthesia Physical Anesthesia Plan  ASA: 3  Anesthesia Plan: MAC   Post-op Pain Management:    Induction:   PONV Risk Score and Plan: 2 and Propofol  infusion and Treatment may vary due to age or medical condition  Airway Management Planned: Nasal Cannula  Additional Equipment:   Intra-op Plan:   Post-operative Plan:   Informed Consent: I have reviewed the patients History and Physical, chart, labs and discussed the procedure including the risks, benefits and alternatives for the proposed anesthesia with the patient or authorized representative who has indicated his/her understanding and acceptance.       Plan Discussed with: CRNA  Anesthesia Plan Comments:          Anesthesia Quick Evaluation  "

## 2024-02-02 NOTE — Transfer of Care (Signed)
 Immediate Anesthesia Transfer of Care Note  Patient: Marisa [name]  Procedure(s) Performed: TRANSESOPHAGEAL ECHOCARDIOGRAM  Patient Location: Cath Lab  Anesthesia Type:MAC  Level of Consciousness: awake, alert , and oriented  Airway & Oxygen Therapy: Patient Spontanous Breathing and Patient connected to nasal cannula oxygen  Post-op Assessment: Report given to RN and Post -op Vital signs reviewed and stable  Post vital signs: Reviewed and stable  Last Vitals:  Vitals Value Taken Time  BP 114/63 02/02/24 10:54  Temp    Pulse 68 02/02/24 10:57  Resp 21 02/02/24 10:57  SpO2 97 % 02/02/24 10:57  Vitals shown include unfiled device data.  Last Pain:  Vitals:   02/02/24 1054  TempSrc:   PainSc: 0-No pain         Complications: There were no known notable events for this encounter.

## 2024-02-02 NOTE — CV Procedure (Signed)
" ° ° °  TRANSESOPHAGEAL ECHOCARDIOGRAM   NAME:  Marisa [name]    MRN: [id number] DOB:  [birth year]    ADMIT DATE: 01/28/2024  INDICATIONS: Stroke  PROCEDURE:   Informed consent was obtained prior to the procedure. The risks, benefits and alternatives for the procedure were discussed and the patient comprehended these risks.  Risks include, but are not limited to, cough, sore throat, vomiting, nausea, somnolence, esophageal and stomach trauma or perforation, bleeding, low blood pressure, aspiration, pneumonia, infection, trauma to the teeth and death.    Procedural time out performed. Anesthesia was administered by Dr. Norlin  and team.    The patient's heart rate, blood pressure, and oxygen saturation are monitored continuously during the procedure.   The transesophageal probe was inserted in the esophagus and stomach without difficulty and multiple views were obtained.   COMPLICATIONS:    There were no immediate complications.  KEY FINDINGS:  Negative Bubble study. LVEF 62% No LAA thrombus.  Full report to follow. Further management per primary team.   Stanly Leavens, MD [place]  [facility]  10:49 AM   "

## 2024-02-03 DIAGNOSIS — E66813 Obesity, class 3: Secondary | ICD-10-CM | POA: Diagnosis not present

## 2024-02-03 DIAGNOSIS — I10 Essential (primary) hypertension: Secondary | ICD-10-CM | POA: Diagnosis not present

## 2024-02-03 DIAGNOSIS — R2990 Unspecified symptoms and signs involving the nervous system: Secondary | ICD-10-CM | POA: Diagnosis not present

## 2024-02-03 DIAGNOSIS — E1165 Type 2 diabetes mellitus with hyperglycemia: Secondary | ICD-10-CM | POA: Diagnosis not present

## 2024-02-03 LAB — GLUCOSE, CAPILLARY
Glucose-Capillary: 129 mg/dL — ABNORMAL HIGH (ref 70–99)
Glucose-Capillary: 222 mg/dL — ABNORMAL HIGH (ref 70–99)
Glucose-Capillary: 131 mg/dL — ABNORMAL HIGH (ref 70–99)
Glucose-Capillary: 171 mg/dL — ABNORMAL HIGH (ref 70–99)

## 2024-02-03 NOTE — Plan of Care (Signed)
 " Problem: Education: Goal: Ability to describe self-care measures that may prevent or decrease complications (Diabetes Survival Skills Education) will improve Outcome: Progressing Goal: Individualized Educational Video(s) Outcome: Progressing   Problem: Coping: Goal: Ability to adjust to condition or change in health will improve Outcome: Progressing   Problem: Fluid Volume: Goal: Ability to maintain a balanced intake and output will improve Outcome: Progressing   Problem: Health Behavior/Discharge Planning: Goal: Ability to identify and utilize available resources and services will improve Outcome: Progressing Goal: Ability to manage health-related needs will improve Outcome: Progressing   Problem: Metabolic: Goal: Ability to maintain appropriate glucose levels will improve Outcome: Progressing   Problem: Nutritional: Goal: Maintenance of adequate nutrition will improve Outcome: Progressing Goal: Progress toward achieving an optimal weight will improve Outcome: Progressing   Problem: Skin Integrity: Goal: Risk for impaired skin integrity will decrease Outcome: Progressing   Problem: Tissue Perfusion: Goal: Adequacy of tissue perfusion will improve Outcome: Progressing   Problem: Education: Goal: Knowledge of disease or condition will improve 02/03/2024 0535 by Fulton Judene PARAS, RN Outcome: Progressing 02/02/2024 2203 by Fulton Judene PARAS, RN Outcome: Progressing Goal: Knowledge of secondary prevention will improve (MUST DOCUMENT ALL) 02/03/2024 0535 by Fulton Judene PARAS, RN Outcome: Progressing 02/02/2024 2203 by Fulton Judene PARAS, RN Outcome: Progressing Goal: Knowledge of patient specific risk factors will improve (DELETE if not current risk factor) 02/03/2024 0535 by Fulton Judene PARAS, RN Outcome: Progressing 02/02/2024 2203 by Fulton Judene PARAS, RN Outcome: Progressing   Problem: Ischemic Stroke/TIA Tissue Perfusion: Goal: Complications of ischemic  stroke/TIA will be minimized 02/03/2024 0535 by Fulton Judene PARAS, RN Outcome: Progressing 02/02/2024 2203 by Fulton Judene PARAS, RN Outcome: Progressing   Problem: Coping: Goal: Will verbalize positive feelings about self 02/03/2024 0535 by Fulton Judene PARAS, RN Outcome: Progressing 02/02/2024 2203 by Fulton Judene PARAS, RN Outcome: Progressing Goal: Will identify appropriate support needs 02/03/2024 0535 by Fulton Judene PARAS, RN Outcome: Progressing 02/02/2024 2203 by Fulton Judene PARAS, RN Outcome: Progressing   Problem: Health Behavior/Discharge Planning: Goal: Ability to manage health-related needs will improve 02/03/2024 0535 by Fulton Judene PARAS, RN Outcome: Progressing 02/02/2024 2203 by Fulton Judene PARAS, RN Outcome: Progressing Goal: Goals will be collaboratively established with patient/family 02/03/2024 0535 by Fulton Judene PARAS, RN Outcome: Progressing 02/02/2024 2203 by Fulton Judene PARAS, RN Outcome: Progressing   Problem: Self-Care: Goal: Ability to participate in self-care as condition permits will improve 02/03/2024 0535 by Fulton Judene PARAS, RN Outcome: Progressing 02/02/2024 2203 by Fulton Judene PARAS, RN Outcome: Progressing Goal: Verbalization of feelings and concerns over difficulty with self-care will improve 02/03/2024 0535 by Fulton Judene PARAS, RN Outcome: Progressing 02/02/2024 2203 by Fulton Judene PARAS, RN Outcome: Progressing Goal: Ability to communicate needs accurately will improve 02/03/2024 0535 by Fulton Judene PARAS, RN Outcome: Progressing 02/02/2024 2203 by Fulton Judene PARAS, RN Outcome: Progressing   Problem: Nutrition: Goal: Risk of aspiration will decrease 02/03/2024 0535 by Fulton Judene PARAS, RN Outcome: Progressing 02/02/2024 2203 by Fulton Judene PARAS, RN Outcome: Progressing Goal: Dietary intake will improve 02/03/2024 0535 by Fulton Judene PARAS, RN Outcome: Progressing 02/02/2024 2203 by Fulton Judene PARAS,  RN Outcome: Progressing   Problem: Education: Goal: Knowledge of General Education information will improve Description: Including pain rating scale, medication(s)/side effects and non-pharmacologic comfort measures Outcome: Progressing   Problem: Health Behavior/Discharge Planning: Goal: Ability to manage health-related needs will improve Outcome: Progressing   Problem: Clinical Measurements: Goal: Ability to maintain clinical measurements within normal limits will improve Outcome: Progressing Goal:  Will remain free from infection Outcome: Progressing Goal: Diagnostic test results will improve Outcome: Progressing Goal: Respiratory complications will improve Outcome: Progressing Goal: Cardiovascular complication will be avoided Outcome: Progressing   Problem: Activity: Goal: Risk for activity intolerance will decrease Outcome: Progressing   Problem: Nutrition: Goal: Adequate nutrition will be maintained Outcome: Progressing   Problem: Coping: Goal: Level of anxiety will decrease Outcome: Progressing   Problem: Elimination: Goal: Will not experience complications related to bowel motility Outcome: Progressing Goal: Will not experience complications related to urinary retention Outcome: Progressing   Problem: Pain Managment: Goal: General experience of comfort will improve and/or be controlled Outcome: Progressing   Problem: Safety: Goal: Ability to remain free from injury will improve Outcome: Progressing   Problem: Skin Integrity: Goal: Risk for impaired skin integrity will decrease Outcome: Progressing   "

## 2024-02-03 NOTE — Progress Notes (Signed)
 Occupational Therapy Treatment Patient Details Name: Marisa [name] MRN: [id number] DOB: 09-04-1960 Today's Date: 02/03/2024   History of present illness 64 y.o female presents to [facility] on 12/31 with R sided weakness and slurred speech. CT (-). MRI acute infarct in L basal ganglia; large left subcortical infarct. PMH: HTN, DM2, HLD.   OT comments  Supportive family present and assisting throughout session. Pt soaked with urine as purwick apparently not working - nsg made aware that urine is cloudy and has strong am[place] smell. Began ADL retraining at bed level regarding bathing and dressing with daughter assisting. Pt overall Mod to Max A with ADL tasks and requires max A with squat pivot transfer due to below listed deficits. Patient very motivated and will benefit from intensive inpatient follow-up therapy, >3 hours/day to maximize functional level of independence with goal to DC home with assistance of family. Acute OT to follow.        If plan is discharge home, recommend the following:  A lot of help with walking and/or transfers;A lot of help with bathing/dressing/bathroom;Assist for transportation;Assistance with cooking/housework   Equipment Recommendations  Tub/shower seat;Wheelchair cushion (measurements OT);Wheelchair (measurements OT);BSC/3in1; hospital bed   Recommendations for Other Services Rehab consult    Precautions / Restrictions Precautions Precautions: Fall Recall of Precautions/Restrictions: Intact Precaution/Restrictions Comments: Note flaccid R UE Restrictions Weight Bearing Restrictions Per Provider Order: No       Mobility Bed Mobility Overal bed mobility: Needs Assistance Bed Mobility: Supine to Sit     Supine to sit: Mod assist, HOB elevated, Used rails - rolling R; taught how to scoop RLE off bed with LLE          Transfers Overall transfer level: Needs assistance Equipment used: Ambulation equipment used Transfers: Bed to  chair/wheelchair/BSC     Squat pivot transfers: Max assist - requires significatn lean ant to lift buttocks from bed             Balance Overall balance assessment: Needs assistance Sitting-balance support: Feet supported, Bilateral upper extremity supported Sitting balance-Leahy Scale: Fair Sitting balance - Comments: Good seated balance with feet supported.  Initial lat lean howver able to correct and maintain good midline postural control                               ADL either performed or assessed with clinical judgement   ADL Overall ADL's : Needs assistance/impaired Eating/Feeding: Set up;Sitting   Grooming: Minimal assistance;Bed level   Upper Body Bathing: Moderate assistance;Bed level   Lower Body Bathing: Maximal assistance;Bed level;Sit to/from stand   Upper Body Dressing : Moderate assistance;Sitting   Lower Body Dressing: Maximal assistance;Bed level   Toilet Transfer: Maximal assistance;Squat-pivot   Toileting- Clothing Manipulation and Hygiene: Maximal assistance;Bed level (encourage use of urinal)       Functional mobility during ADLs: Maximal assistance      Extremity/Trunk Assessment Upper Extremity Assessment Upper Extremity Assessment: Right hand dominant;RUE deficits/detail RUE Deficits / Details: Flaccid; ? shoulder elevation - trace   Lower Extremity Assessment Lower Extremity Assessment: Defer to PT evaluation- able to extend leg @ bed level        Vision   Vision Assessment?: No apparent visual deficits   Perception Perception Perception: Within Functional Limits   Praxis Praxis Praxis: WFL   Communication Communication Communication: Impaired Factors Affecting Communication: Difficulty expressing self;Reduced clarity of speech   Cognition Arousal: Alert Behavior During Therapy: [facility]  for tasks assessed/performed Cognition: slow processing - will further assess                                Following commands: Intact        Cueing   Cueing Techniques: Verbal cues, Tactile cues, Visual cues  Exercises Exercises: Other exercises Other Exercises Other Exercises: RLE extension x 10 Other Exercises: RUE PROM through full range Other Exercises: educated family on weight bearing through R elbow  with lateral leans in seated position Other Exercises: forward beding to toes in seated to encourage ant weight shift    Shoulder Instructions       General Comments      Pertinent Vitals/ Pain        C/o RLE pain  - appears hypersensitive but tolerated well  Home Living                                          Prior Functioning/Environment              Frequency  Min 2X/week        Progress Toward Goals  OT Goals(current goals can now be found in the care plan section)  Progress towards OT goals: Progressing toward goals  Acute Rehab OT Goals Patient Stated Goal: go to rehab and get better OT Goal Formulation: With patient/family Time For Goal Achievement: 02/13/24 Potential to Achieve Goals: Good ADL Goals Pt Will Perform Grooming: with set-up;sitting Pt Will Perform Upper Body Dressing: with supervision;sitting Pt Will Perform Lower Body Dressing: with min assist;sitting/lateral leans Pt Will Transfer to Toilet: with min assist;squat pivot transfer;bedside commode Pt/caregiver will Perform Home Exercise Program: Increased strength;Right Upper extremity;With minimal assist  Plan      Co-evaluation                 AM-PAC OT 6 Clicks Daily Activity     Outcome Measure   Help from another person eating meals?: A Little Help from another person taking care of personal grooming?: A Little Help from another person toileting, which includes using toliet, bedpan, or urinal?: A Lot Help from another person bathing (including washing, rinsing, drying)?: A Lot Help from another person to put on and taking off regular upper body  clothing?: A Lot Help from another person to put on and taking off regular lower body clothing?: A Lot 6 Click Score: 14    End of Session Equipment Utilized During Treatment: Gait belt  OT Visit Diagnosis: Unsteadiness on feet (R26.81);Muscle weakness (generalized) (M62.81);Hemiplegia and hemiparesis;Other abnormalities of gait and mobility (R26.89) Hemiplegia - Right/Left: Right Hemiplegia - dominant/non-dominant: Dominant Hemiplegia - caused by: Cerebral infarction   Activity Tolerance Patient tolerated treatment well   Patient Left with call bell/phone within reach;with family/visitor present;in chair;with chair alarm set   Nurse Communication Mobility status;Need for lift equipment        Time: [phone] OT Time Calculation (min): 34 min  Charges: OT General Charges $OT Visit: 1 Visit OT Treatments $Self Care/Home Management : 23-37 mins  Kreg Sink, OT/L   Acute OT Clinical Specialist Acute Rehabilitation Services Pager [phone] Office [phone]   [facility] 02/03/2024, 9:53 AM

## 2024-02-03 NOTE — Care Management Important Message (Signed)
 Important Message  Patient Details  Name: Marisa [name] MRN: [id number] Date of Birth: 1960-04-05   Important Message Given:  Yes - Medicare IM     Claretta Deed 02/03/2024, 10:18 AM

## 2024-02-03 NOTE — Progress Notes (Signed)
 "                        PROGRESS NOTE        PATIENT DETAILS Name: Marisa [name] Age: 64 y.o. Sex: female Date of Birth: [birth year] Admit Date: 01/28/2024 Admitting Physician Amy LOISE Free, DO ERE:[email or web address], Catheryn, MD  Brief Summary: Patient is a 64 y.o.  female with history of HTN, HLD, DM-2-presented with right sided weakness-found to have acute CVA.  Significant events: 12/31>> admit to [facility]  Significant studies: 12/02>> A1c: 7.2 12/31>> CTA head/neck: No LVO-or significant stenosis. 01/01>> MRI brain: Left basal ganglia infarct 01/01>> UDS: Negative 01/02>> LDL: 145 01/02>> echo: EF 39-34%, grade 1 diastolic dysfunction 01/03>> TCD with bubble study: No evidence of right-to-left shunt. 01/05>> TEE: Negative bubble study-no thrombus.  Significant microbiology data: None  Procedures: None  Consults: Neurology  Subjective: Unchanged right-sided hemiplegia and right facial droop.  Objective: Vitals: Blood pressure 135/65, pulse 67, temperature 97.9 F (36.6 C), temperature source Oral, resp. rate (!) 22, height 5' (1.524 m), weight 111.1 kg, SpO2 93%.   Exam: Awake/alert Right facial droop Unchanged right-sided hemiplegia.  Pertinent Labs/Radiology:    Latest Ref Rng & Units 01/30/2024    4:46 AM 01/28/2024   10:53 PM 12/30/2023   11:43 AM  CBC  WBC 4.0 - 10.5 K/uL 7.3  8.6  7.0   Hemoglobin 12.0 - 15.0 g/dL 86.1  86.9  86.4   Hematocrit 36.0 - 46.0 % 41.7  40.2  44.9   Platelets 150 - 400 K/uL 281  273  304     Lab Results  Component Value Date   NA 137 01/30/2024   K 3.6 01/30/2024   CL 100 01/30/2024   CO2 23 01/30/2024      Assessment/Plan: Acute CVA Likely small vessel disease versus cardioembolic Unchanged facial droop/right-sided hemiplegia Workup as above On aspirin /Plavix  On high intensity statin TEE negative for thrombus-negative bubble study CIR planned on discharge-awaiting insurance authorization.  HTN Initially permissive  hypertension was allowed Antihypertensives has been gradually started BP now stable-continue amlodipine /chlorthalidone /Avapro  and Bystolic . Follow/optimize  DM-2 Continue SSI  Recent Labs    02/02/24 1534 02/02/24 2018 02/03/24 0842  GLUCAP 189* 176* 129*    HLD Statin  OSA CPAP nightly  2.2 cm nodule left thyroid Incidental finding on CTA head/neck Stable for outpatient nonemergent thyroid ultrasound-as this is an incidental finding.  Class III obesity: Estimated body mass index is 47.85 kg/m as calculated from the following:   Height as of this encounter: 5' (1.524 m).   Weight as of this encounter: 111.1 kg.   Code status:   Code Status: Full Code   DVT Prophylaxis: enoxaparin  (LOVENOX ) injection 40 mg Start: 01/30/24 1000 SCD's Start: 01/29/24 1047 Place and maintain sequential compression device Start: 01/29/24 0046 Place TED hose Start: 01/29/24 0046   Family Communication: Daughter at bedside.   Disposition Plan: Status is: Observation The patient will require care spanning > 2 midnights and should be moved to inpatient because: Severity of illness   Planned Discharge Destination:Skilled nursing facility versus CIR   Diet: Diet Order             Diet heart healthy/carb modified Room service appropriate? Yes; Fluid consistency: Thin  Diet effective now                     Antimicrobial agents: Anti-infectives (From admission, onward)    None  MEDICATIONS: Scheduled Meds:  amLODipine   5 mg Oral Daily   aspirin  EC  81 mg Oral Daily   atorvastatin   80 mg Oral Once per day on Monday Tuesday Wednesday Thursday Friday Saturday   chlorthalidone   25 mg Oral Daily   clopidogrel   75 mg Oral Daily   dapagliflozin  propanediol  10 mg Oral Daily   enoxaparin  (LOVENOX ) injection  40 mg Subcutaneous Q24H   folic acid   1 mg Oral Daily   insulin  aspart  0-20 Units Subcutaneous TID WC   insulin  aspart  0-5 Units Subcutaneous QHS    irbesartan   37.5 mg Oral Daily   nebivolol   10 mg Oral Daily   polyethylene glycol  17 g Oral Daily   senna-docusate  2 tablet Oral QHS   Continuous Infusions:   PRN Meds:.bisacodyl , hydrALAZINE , mouth rinse   I have personally reviewed following labs and imaging studies  LABORATORY DATA: CBC: Recent Labs  Lab 01/28/24 2253 01/30/24 0446  WBC 8.6 7.3  NEUTROABS 3.6  --   HGB 13.0 13.8  HCT 40.2 41.7  MCV 68.6* 68.4*  PLT 273 281    Basic Metabolic Panel: Recent Labs  Lab 01/28/24 2253 01/30/24 0446  NA 140 137  K 3.1* 3.6  CL 100 100  CO2 27 23  GLUCOSE 132* 141*  BUN 12 11  CREATININE 0.79 0.74  CALCIUM  9.3 9.4    GFR: Estimated Creatinine Clearance: 80.4 mL/min (by C-G formula based on SCr of 0.74 mg/dL).  Liver Function Tests: Recent Labs  Lab 01/28/24 2253  AST 17  ALT 10  ALKPHOS 95  BILITOT 0.5  PROT 7.8  ALBUMIN 4.2   No results for input(s): LIPASE, AMYLASE in the last 168 hours. No results for input(s): AMMONIA in the last 168 hours.  Coagulation Profile: Recent Labs  Lab 01/28/24 2253  INR 1.0    Cardiac Enzymes: No results for input(s): CKTOTAL, CKMB, CKMBINDEX, TROPONINI in the last 168 hours.  BNP (last 3 results) No results for input(s): PROBNP in the last 8760 hours.  Lipid Profile: No results for input(s): CHOL, HDL, LDLCALC, TRIG, CHOLHDL, LDLDIRECT in the last 72 hours.   Thyroid Function Tests: No results for input(s): TSH, T4TOTAL, FREET4, T3FREE, THYROIDAB in the last 72 hours.  Anemia Panel: No results for input(s): VITAMINB12, FOLATE, FERRITIN, TIBC, IRON, RETICCTPCT in the last 72 hours.  Urine analysis:    Component Value Date/Time   COLORURINE STRAW (A) 01/29/2024 0005   APPEARANCEUR CLEAR 01/29/2024 0005   LABSPEC 1.010 01/29/2024 0005   PHURINE 7.0 01/29/2024 0005   GLUCOSEU NEGATIVE 01/29/2024 0005   HGBUR NEGATIVE 01/29/2024 0005   BILIRUBINUR  NEGATIVE 01/29/2024 0005   BILIRUBINUR negative 11/26/2023 1636   BILIRUBINUR Negative 10/21/2022 1516   KETONESUR NEGATIVE 01/29/2024 0005   PROTEINUR NEGATIVE 01/29/2024 0005   UROBILINOGEN 0.2 11/26/2023 1636   UROBILINOGEN 0.2 07/31/2012 2358   NITRITE NEGATIVE 01/29/2024 0005   LEUKOCYTESUR NEGATIVE 01/29/2024 0005    Sepsis Labs: Lactic Acid, Venous    Component Value Date/Time   LATICACIDVEN 1.15 02/17/2015 0057    MICROBIOLOGY: No results found for this or any previous visit (from the past 240 hours).  RADIOLOGY STUDIES/RESULTS: ECHO TEE Result Date: 02/02/2024    TRANSESOPHOGEAL ECHO REPORT   Patient Name:   Marisa [name] Date of Exam: 02/02/2024 Medical Rec #:  [id number]            Height:       60.0 in Accession #:  [id number]           Weight:       245.0 lb Date of Birth:  04/28/1960           BSA:          2.035 m Patient Age:    63 years             BP:           141/70 mmHg Patient Gender: F                    HR:           75 bpm. Exam Location:  Inpatient Procedure: Transesophageal Echo, 3D Echo, Cardiac Doppler, Color Doppler and            Saline Contrast Bubble Study (Both Spectral and Color Flow Doppler            were utilized during procedure). Indications:     Stroke  History:         Patient has prior history of Echocardiogram examinations, most                  recent 01/30/2024. TIA and Stroke; Risk Factors:Hypertension and                  Diabetes.  Sonographer:     Philomena Daring Referring Phys:  [id number] HAO MENG Diagnosing Phys: Stanly Leavens MD PROCEDURE: After discussion of the risks and benefits of a TEE, an informed consent was obtained from the patient. The transesophogeal probe was passed without difficulty through the esophogus of the patient. Imaged were obtained with the patient in a left lateral decubitus position. Sedation performed by different physician. The patient was monitored while under deep sedation. Anesthestetic sedation was  provided intravenously by Anesthesiology: 150mg  of Propofol , 100mg  of Lidocaine . The patient developed no complications during the procedure.  IMPRESSIONS  1. Left ventricular ejection fraction, by estimation, is 60 to 65%. Left ventricular ejection fraction by 3D volume is 63 %. The left ventricle has normal function.  2. Right ventricular systolic function is normal. The right ventricular size is normal.  3. No left atrial/left atrial appendage thrombus was detected. The LAA emptying velocity was 25 cm/s.  4. The mitral valve is normal in structure. Trivial mitral valve regurgitation. No evidence of mitral stenosis.  5. The aortic valve is tricuspid. Aortic valve regurgitation is not visualized. No aortic stenosis is present.  6. Agitated saline contrast bubble study was negative, with no evidence of any interatrial shunt.  7. 3D performed of the mitral valve and demonstrates Trivial Mitral regurgitation. FINDINGS  Left Ventricle: Left ventricular ejection fraction, by estimation, is 60 to 65%. Left ventricular ejection fraction by 3D volume is 63 %. The left ventricle has normal function. The left ventricular internal cavity size was normal in size. Right Ventricle: The right ventricular size is normal. No increase in right ventricular wall thickness. Right ventricular systolic function is normal. Left Atrium: Left atrial size was normal in size. No left atrial/left atrial appendage thrombus was detected. The LAA emptying velocity was 25 cm/s. Right Atrium: Right atrial size was normal in size. Pericardium: There is no evidence of pericardial effusion. Mitral Valve: The mitral valve is normal in structure. Trivial mitral valve regurgitation. No evidence of mitral valve stenosis. Tricuspid Valve: Possible bifid posterior leaflet. The tricuspid valve is grossly normal. Tricuspid valve regurgitation is mild . No evidence of tricuspid stenosis. Aortic  Valve: The aortic valve is tricuspid. Aortic valve regurgitation  is not visualized. No aortic stenosis is present. Aortic valve peak gradient measures 3.3 mmHg. Pulmonic Valve: The pulmonic valve was grossly normal. Pulmonic valve regurgitation is not visualized. No evidence of pulmonic stenosis. Aorta: Artifact in the aorta. The aortic root, ascending aorta, aortic arch and descending aorta are all structurally normal, with no evidence of dilitation or obstruction. Pulmonary Artery: The pulmonary artery is of normal size. Venous: The left upper pulmonary vein, left lower pulmonary vein, right upper pulmonary vein and right lower pulmonary vein are normal. IAS/Shunts: No atrial level shunt detected by color flow Doppler. Agitated saline contrast was given intravenously to evaluate for intracardiac shunting. Agitated saline contrast bubble study was negative, with no evidence of any interatrial shunt. Additional Comments: 3D was performed not requiring image post processing on an independent workstation and was normal. Spectral Doppler performed.  3D Volume EF LV 3D EF:    Left ventricular ejection fraction by 3D volume is 63              %. LV 3D EDV:   64.89 ml LV 3D ESV:   24.10 ml  3D Volume EF: 3D EF:        63 % AORTIC VALVE AV Vmax:      91.00 cm/s AV Peak Grad: 3.3 mmHg Stanly Leavens MD Electronically signed by Stanly Leavens MD Signature Date/Time: 02/02/2024/11:51:44 AM    Final    EP STUDY Result Date: 02/02/2024 See surgical note for result.    LOS: 4 days   Donalda Applebaum, MD  [facility]    To contact the attending provider between 7A-7P or the covering provider during after hours 7P-7A, please log into the web site [email or web address] and access using universal  password for that web site. If you do not have the password, please call the hospital operator.  02/03/2024, 11:02 AM    "

## 2024-02-03 NOTE — TOC Progression Note (Signed)
 Transition of Care [facility]) - Progression Note    Patient Details  Name: Marisa [name] MRN: [id number] Date of Birth: [birth year]  Transition of [facility]) CM/SW Contact  Landry DELENA Senters, RN Phone Number: 02/03/2024, 12:36 PM  Clinical Narrative:    CIR reports they are sending info to patient's insurance today for auth.   CM did speak to patient and family in room about [facility] in [facility] as secondary option. They report they are willing to do this, but would like to see if CIR can be approved first.   CM will continue to follow.    Expected Discharge Plan: IP Rehab Facility Barriers to Discharge: English As A Second Language Teacher, Continued Medical Work up               Expected Discharge Plan and Services In-house Referral: Clinical Social Work   Post Acute Care Choice: IP Rehab Living arrangements for the past 2 months: Single Family Home                                       Social Drivers of Health (SDOH) Interventions SDOH Screenings   Food Insecurity: No Food Insecurity (01/29/2024)  Housing: Low Risk (01/29/2024)  Transportation Needs: No Transportation Needs (01/29/2024)  Utilities: Not At Risk (01/29/2024)  Alcohol Screen: Low Risk (10/09/2023)  Depression (PHQ2-9): Low Risk (11/18/2023)  Financial Resource Strain: Low Risk (10/09/2023)  Physical Activity: Inactive (10/09/2023)  Social Connections: Socially Integrated (10/09/2023)  Stress: Stress Concern Present (10/09/2023)  Tobacco Use: Low Risk (01/28/2024)    Readmission Risk Interventions     No data to display

## 2024-02-03 NOTE — Progress Notes (Signed)
 Physical Therapy Treatment Patient Details Name: Marisa [name] MRN: [id number] DOB: 1960-03-21 Today's Date: 02/03/2024   History of Present Illness 64 y.o female presents to [facility] on 12/31 with R sided weakness and slurred speech. CT (-). MRI acute infarct in L basal ganglia; large left subcortical infarct. PMH: HTN, DM2, HLD.    PT Comments  Pt received in the recliner and requesting to use the bathroom. Pt able to transfer to the [facility] using the stedy, but is noted to have bowel incontinence prior. Pt able to stand to stedy with up to min A +2, demonstrating good power up and stability. Pt able to participate in weight shifting with assist and cues for R quad activation. Pt demonstrates good activity tolerance and motivation to progress functional mobility. Pt continues to benefit from PT services to progress toward functional mobility goals.     If plan is discharge home, recommend the following: Two people to help with walking and/or transfers;Two people to help with bathing/dressing/bathroom;Assistance with cooking/housework;Assistance with feeding;Direct supervision/assist for medications management;Direct supervision/assist for financial management;Assist for transportation;Help with stairs or ramp for entrance   Can travel by private vehicle        Equipment Recommendations  BSC/3in1;Wheelchair (measurements PT);Wheelchair cushion (measurements PT)    Recommendations for Other Services       Precautions / Restrictions Precautions Precautions: Fall Recall of Precautions/Restrictions: Intact Precaution/Restrictions Comments: Note flaccid R UE Restrictions Weight Bearing Restrictions Per Provider Order: No     Mobility  Bed Mobility               General bed mobility comments: Pt OOB upon entry    Transfers Overall transfer level: Needs assistance Equipment used: Ambulation equipment used Transfers: Bed to chair/wheelchair/BSC, Sit to/from Stand Sit to Stand: +2  physical assistance, Min assist, +2 safety/equipment, Contact guard assist           General transfer comment: stood from recliner with min A +2 and from stedy pads with CGA; stedy used to transfer to/from [facility]    Ambulation/Gait             Pre-gait activities: weight shifting in stedy General Gait Details: unable   Stairs             Wheelchair Mobility     Tilt Bed    Modified Rankin (Stroke Patients Only) Modified Rankin (Stroke Patients Only) Pre-Morbid Rankin Score: No symptoms Modified Rankin: Severe disability     Balance Overall balance assessment: Needs assistance Sitting-balance support: Feet supported, Single extremity supported Sitting balance-Leahy Scale: Fair Sitting balance - Comments: sitting unsupported in recliner   Standing balance support: Single extremity supported, Reliant on assistive device for balance Standing balance-Leahy Scale: Poor Standing balance comment: reliant on stedy support and CGA statically                            Communication Communication Communication: Impaired Factors Affecting Communication: Difficulty expressing self;Reduced clarity of speech  Cognition Arousal: Alert Behavior During Therapy: WFL for tasks assessed/performed   PT - Cognitive impairments: No apparent impairments                         Following commands: Intact      Cueing Cueing Techniques: Verbal cues, Tactile cues, Visual cues  Exercises      General Comments        Pertinent Vitals/Pain Pain Assessment Pain Assessment: No/denies  pain     PT Goals (current goals can now be found in the care plan section) Acute Rehab PT Goals Patient Stated Goal: No additional goals stated Time For Goal Achievement: 02/13/24 Progress towards PT goals: Progressing toward goals    Frequency    Min 3X/week       AM-PAC PT 6 Clicks Mobility   Outcome Measure  Help needed turning from your back to your  side while in a flat bed without using bedrails?: A Little Help needed moving from lying on your back to sitting on the side of a flat bed without using bedrails?: A Lot Help needed moving to and from a bed to a chair (including a wheelchair)?: Total Help needed standing up from a chair using your arms (e.g., wheelchair or bedside chair)?: A Lot Help needed to walk in hospital room?: Total Help needed climbing 3-5 steps with a railing? : Total 6 Click Score: 10    End of Session Equipment Utilized During Treatment: Gait belt Activity Tolerance: Patient tolerated treatment well Patient left: in chair;with call bell/phone within reach;with family/visitor present;with chair alarm set Nurse Communication: Mobility status;Need for lift equipment PT Visit Diagnosis: Unsteadiness on feet (R26.81);Muscle weakness (generalized) (M62.81);Hemiplegia and hemiparesis Hemiplegia - Right/Left: Right Hemiplegia - dominant/non-dominant: Dominant Hemiplegia - caused by: Cerebral infarction     Time: 1138-1204 PT Time Calculation (min) (ACUTE ONLY): 26 min  Charges:    $Therapeutic Activity: 23-37 mins PT General Charges $$ ACUTE PT VISIT: 1 Visit                    Darryle George, PTA Acute Rehabilitation Services Secure Chat Preferred  [phone]    Darryle George 02/03/2024, 12:38 PM

## 2024-02-03 NOTE — Progress Notes (Signed)
.  Inpatient Rehab Admissions Coordinator:    [facility] following. Will send case to [facility] today.   Leita Kleine, MS, CCC-SLP Rehab Admissions Coordinator  [phone] (celll) [phone] (office)

## 2024-02-04 LAB — GLUCOSE, CAPILLARY
Glucose-Capillary: 145 mg/dL — ABNORMAL HIGH (ref 70–99)
Glucose-Capillary: 190 mg/dL — ABNORMAL HIGH (ref 70–99)
Glucose-Capillary: 198 mg/dL — ABNORMAL HIGH (ref 70–99)
Glucose-Capillary: 153 mg/dL — ABNORMAL HIGH (ref 70–99)

## 2024-02-04 NOTE — Plan of Care (Signed)
" °  Problem: Metabolic: Goal: Ability to maintain appropriate glucose levels will improve Outcome: Progressing   Problem: Nutritional: Goal: Maintenance of adequate nutrition will improve Outcome: Progressing   Problem: Education: Goal: Knowledge of disease or condition will improve Outcome: Progressing   Problem: Coping: Goal: Will verbalize positive feelings about self Outcome: Progressing   "

## 2024-02-04 NOTE — Progress Notes (Signed)
 "                        PROGRESS NOTE        PATIENT DETAILS Name: Marisa [name] Age: 64 y.o. Sex: female Date of Birth: Jun 21, 1960 Admit Date: 01/28/2024 Admitting Physician Amy LOISE Free, DO ERE:Djwizmd, Catheryn, MD  Brief Summary: Patient is a 64 y.o.  female with history of HTN, HLD, DM-2-presented with right sided weakness-found to have acute CVA.  Significant events: 12/31>> admit to [facility]  Significant studies: 12/02>> A1c: 7.2 12/31>> CTA head/neck: No LVO-or significant stenosis. 01/01>> MRI brain: Left basal ganglia infarct 01/01>> UDS: Negative 01/02>> LDL: 145 01/02>> echo: EF 39-34%, grade 1 diastolic dysfunction 01/03>> TCD with bubble study: No evidence of right-to-left shunt. 01/05>> TEE: Negative bubble study-no thrombus.  Significant microbiology data: None  Procedures: None  Consults: Neurology  Subjective: No major show-appears unchanged-awaiting CIR bed-awaiting insurance authorization.  Objective: Vitals: Blood pressure (!) 106/57, pulse 68, temperature 98.2 F (36.8 C), temperature source Oral, resp. rate 16, height 5' (1.524 m), weight 111.1 kg, SpO2 95%.   Exam: Awake/alert Right facial droop Continues to have unchanged right-sided hemiplegia.  Pertinent Labs/Radiology:    Latest Ref Rng & Units 01/30/2024    4:46 AM 01/28/2024   10:53 PM 12/30/2023   11:43 AM  CBC  WBC 4.0 - 10.5 K/uL 7.3  8.6  7.0   Hemoglobin 12.0 - 15.0 g/dL 86.1  86.9  86.4   Hematocrit 36.0 - 46.0 % 41.7  40.2  44.9   Platelets 150 - 400 K/uL 281  273  304     Lab Results  Component Value Date   NA 137 01/30/2024   K 3.6 01/30/2024   CL 100 01/30/2024   CO2 23 01/30/2024      Assessment/Plan: Acute CVA Likely small vessel disease versus cardioembolic Unchanged facial droop/right-sided hemiplegia Workup as above On aspirin /Plavix  On high intensity statin TEE negative for thrombus-negative bubble study CIR planned on discharge-awaiting insurance  authorization.  HTN Initially permissive hypertension was allowed Antihypertensives has been gradually started BP now stable-continue amlodipine /chlorthalidone /Avapro  and Bystolic . Follow/optimize  DM-2 Continue SSI  Recent Labs    02/03/24 2106 02/04/24 0801 02/04/24 1250  GLUCAP 171* 145* 190*    HLD Statin  OSA CPAP nightly  2.2 cm nodule left thyroid Incidental finding on CTA head/neck Stable for outpatient nonemergent thyroid ultrasound-as this is an incidental finding.  Class III obesity: Estimated body mass index is 47.85 kg/m as calculated from the following:   Height as of this encounter: 5' (1.524 m).   Weight as of this encounter: 111.1 kg.   Code status:   Code Status: Full Code   DVT Prophylaxis: enoxaparin  (LOVENOX ) injection 40 mg Start: 01/30/24 1000 SCD's Start: 01/29/24 1047 Place and maintain sequential compression device Start: 01/29/24 0046 Place TED hose Start: 01/29/24 0046   Family Communication: Daughter at bedside.   Disposition Plan: Status is: Observation The patient will require care spanning > 2 midnights and should be moved to inpatient because: Severity of illness   Planned Discharge Destination:Skilled nursing facility versus CIR   Diet: Diet Order             Diet heart healthy/carb modified Room service appropriate? Yes; Fluid consistency: Thin  Diet effective now                     Antimicrobial agents: Anti-infectives (From admission, onward)  None        MEDICATIONS: Scheduled Meds:  amLODipine   5 mg Oral Daily   aspirin  EC  81 mg Oral Daily   atorvastatin   80 mg Oral Once per day on Monday Tuesday Wednesday Thursday Friday Saturday   chlorthalidone   25 mg Oral Daily   clopidogrel   75 mg Oral Daily   dapagliflozin  propanediol  10 mg Oral Daily   enoxaparin  (LOVENOX ) injection  40 mg Subcutaneous Q24H   folic acid   1 mg Oral Daily   insulin  aspart  0-20 Units Subcutaneous TID WC   insulin   aspart  0-5 Units Subcutaneous QHS   irbesartan   37.5 mg Oral Daily   nebivolol   10 mg Oral Daily   polyethylene glycol  17 g Oral Daily   senna-docusate  2 tablet Oral QHS   Continuous Infusions:   PRN Meds:.bisacodyl , hydrALAZINE , mouth rinse   I have personally reviewed following labs and imaging studies  LABORATORY DATA: CBC: Recent Labs  Lab 01/28/24 2253 01/30/24 0446  WBC 8.6 7.3  NEUTROABS 3.6  --   HGB 13.0 13.8  HCT 40.2 41.7  MCV 68.6* 68.4*  PLT 273 281    Basic Metabolic Panel: Recent Labs  Lab 01/28/24 2253 01/30/24 0446  NA 140 137  K 3.1* 3.6  CL 100 100  CO2 27 23  GLUCOSE 132* 141*  BUN 12 11  CREATININE 0.79 0.74  CALCIUM  9.3 9.4    GFR: Estimated Creatinine Clearance: 80.4 mL/min (by C-G formula based on SCr of 0.74 mg/dL).  Liver Function Tests: Recent Labs  Lab 01/28/24 2253  AST 17  ALT 10  ALKPHOS 95  BILITOT 0.5  PROT 7.8  ALBUMIN 4.2   No results for input(s): LIPASE, AMYLASE in the last 168 hours. No results for input(s): AMMONIA in the last 168 hours.  Coagulation Profile: Recent Labs  Lab 01/28/24 2253  INR 1.0    Cardiac Enzymes: No results for input(s): CKTOTAL, CKMB, CKMBINDEX, TROPONINI in the last 168 hours.  BNP (last 3 results) No results for input(s): PROBNP in the last 8760 hours.  Lipid Profile: No results for input(s): CHOL, HDL, LDLCALC, TRIG, CHOLHDL, LDLDIRECT in the last 72 hours.   Thyroid Function Tests: No results for input(s): TSH, T4TOTAL, FREET4, T3FREE, THYROIDAB in the last 72 hours.  Anemia Panel: No results for input(s): VITAMINB12, FOLATE, FERRITIN, TIBC, IRON, RETICCTPCT in the last 72 hours.  Urine analysis:    Component Value Date/Time   COLORURINE STRAW (A) 01/29/2024 0005   APPEARANCEUR CLEAR 01/29/2024 0005   LABSPEC 1.010 01/29/2024 0005   PHURINE 7.0 01/29/2024 0005   GLUCOSEU NEGATIVE 01/29/2024 0005   HGBUR  NEGATIVE 01/29/2024 0005   BILIRUBINUR NEGATIVE 01/29/2024 0005   BILIRUBINUR negative 11/26/2023 1636   BILIRUBINUR Negative 10/21/2022 1516   KETONESUR NEGATIVE 01/29/2024 0005   PROTEINUR NEGATIVE 01/29/2024 0005   UROBILINOGEN 0.2 11/26/2023 1636   UROBILINOGEN 0.2 07/31/2012 2358   NITRITE NEGATIVE 01/29/2024 0005   LEUKOCYTESUR NEGATIVE 01/29/2024 0005    Sepsis Labs: Lactic Acid, Venous    Component Value Date/Time   LATICACIDVEN 1.15 02/17/2015 0057    MICROBIOLOGY: No results found for this or any previous visit (from the past 240 hours).  RADIOLOGY STUDIES/RESULTS: No results found.    LOS: 5 days   Donalda Applebaum, MD  [facility]    To contact the attending provider between 7A-7P or the covering provider during after hours 7P-7A, please log into the web site [email or web address]  and access using universal [place] password for that web site. If you do not have the password, please call the hospital operator.  02/04/2024, 2:06 PM    "

## 2024-02-04 NOTE — Progress Notes (Signed)
 Inpatient Rehab Admissions Coordinator:    CIR following. Case pending with [facility]  Leita Kleine, MS, CCC-SLP Rehab Admissions Coordinator  [phone] (celll) [phone] (office)

## 2024-02-04 NOTE — Plan of Care (Signed)
  Problem: Health Behavior/Discharge Planning: Goal: Ability to identify and utilize available resources and services will improve Outcome: Progressing

## 2024-02-05 ENCOUNTER — Other Ambulatory Visit: Payer: Self-pay

## 2024-02-05 ENCOUNTER — Encounter (HOSPITAL_COMMUNITY): Payer: Self-pay | Admitting: Physical Medicine and Rehabilitation

## 2024-02-05 ENCOUNTER — Inpatient Hospital Stay (HOSPITAL_COMMUNITY)
Admission: AD | Admit: 2024-02-05 | Discharge: 2024-03-02 | DRG: 057 | Disposition: A | Discharge status: Home / Self Care | Source: Intra-hospital | Attending: Physical Medicine and Rehabilitation | Admitting: Physical Medicine and Rehabilitation

## 2024-02-05 DIAGNOSIS — M7989 Other specified soft tissue disorders: Secondary | ICD-10-CM | POA: Diagnosis present

## 2024-02-05 DIAGNOSIS — M25432 Effusion, left wrist: Secondary | ICD-10-CM | POA: Diagnosis present

## 2024-02-05 DIAGNOSIS — E1165 Type 2 diabetes mellitus with hyperglycemia: Secondary | ICD-10-CM | POA: Diagnosis present

## 2024-02-05 DIAGNOSIS — R2231 Localized swelling, mass and lump, right upper limb: Secondary | ICD-10-CM | POA: Diagnosis present

## 2024-02-05 DIAGNOSIS — E66813 Obesity, class 3: Secondary | ICD-10-CM

## 2024-02-05 DIAGNOSIS — I639 Cerebral infarction, unspecified: Principal | ICD-10-CM | POA: Diagnosis present

## 2024-02-05 DIAGNOSIS — K5900 Constipation, unspecified: Secondary | ICD-10-CM | POA: Diagnosis present

## 2024-02-05 DIAGNOSIS — E041 Nontoxic single thyroid nodule: Secondary | ICD-10-CM | POA: Diagnosis present

## 2024-02-05 DIAGNOSIS — E559 Vitamin D deficiency, unspecified: Secondary | ICD-10-CM | POA: Diagnosis present

## 2024-02-05 DIAGNOSIS — R87610 Atypical squamous cells of undetermined significance on cytologic smear of cervix (ASC-US): Secondary | ICD-10-CM

## 2024-02-05 DIAGNOSIS — E7800 Pure hypercholesterolemia, unspecified: Secondary | ICD-10-CM | POA: Diagnosis present

## 2024-02-05 DIAGNOSIS — G4733 Obstructive sleep apnea (adult) (pediatric): Secondary | ICD-10-CM | POA: Diagnosis present

## 2024-02-05 DIAGNOSIS — R001 Bradycardia, unspecified: Secondary | ICD-10-CM | POA: Diagnosis present

## 2024-02-05 DIAGNOSIS — Z6841 Body Mass Index (BMI) 40.0 and over, adult: Secondary | ICD-10-CM

## 2024-02-05 DIAGNOSIS — E876 Hypokalemia: Secondary | ICD-10-CM | POA: Diagnosis present

## 2024-02-05 LAB — GLUCOSE, CAPILLARY
Glucose-Capillary: 146 mg/dL — ABNORMAL HIGH (ref 70–99)
Glucose-Capillary: 211 mg/dL — ABNORMAL HIGH (ref 70–99)
Glucose-Capillary: 109 mg/dL — ABNORMAL HIGH (ref 70–99)
Glucose-Capillary: 173 mg/dL — ABNORMAL HIGH (ref 70–99)

## 2024-02-05 MED ORDER — POLYETHYLENE GLYCOL 3350 17 G PO PACK: 17.0000 g | PACK | Freq: Every day | ORAL | Status: DC

## 2024-02-05 MED ORDER — INSULIN ASPART 100 UNIT/ML IJ SOLN: 0.0000 [IU] | Freq: Every day | INTRAMUSCULAR | Status: DC

## 2024-02-05 MED ORDER — ZINC SULFATE 220 (50 ZN) MG PO CAPS
220.0000 mg | ORAL_CAPSULE | Freq: Every day | ORAL | Status: AC
  Administered 2024-02-05 – 2024-02-18 (×14): 220 mg via ORAL
  Filled 2024-02-05 (×14): qty 1

## 2024-02-05 MED ORDER — ATORVASTATIN CALCIUM 80 MG PO TABS: ORAL_TABLET | ORAL | Status: DC

## 2024-02-05 MED ORDER — CLOPIDOGREL BISULFATE 75 MG PO TABS
75.0000 mg | ORAL_TABLET | Freq: Every day | ORAL | Status: DC
  Administered 2024-02-06 – 2024-03-02 (×26): 75 mg via ORAL
  Filled 2024-02-05 (×26): qty 1

## 2024-02-05 MED ORDER — SENNOSIDES-DOCUSATE SODIUM 8.6-50 MG PO TABS: 2.0000 | ORAL_TABLET | Freq: Every day | ORAL | Status: DC

## 2024-02-05 MED ORDER — TRAZODONE HCL 50 MG PO TABS: 25.0000 mg | ORAL_TABLET | Freq: Every evening | ORAL | Status: DC | PRN

## 2024-02-05 MED ORDER — POLYETHYLENE GLYCOL 3350 17 G PO PACK
17.0000 g | PACK | Freq: Every day | ORAL | Status: DC
  Administered 2024-02-06 – 2024-02-16 (×10): 17 g via ORAL
  Filled 2024-02-05 (×12): qty 1

## 2024-02-05 MED ORDER — PROCHLORPERAZINE 25 MG RE SUPP: 12.5000 mg | Freq: Four times a day (QID) | RECTAL | Status: DC | PRN

## 2024-02-05 MED ORDER — ASPIRIN 81 MG PO TBEC: 81.0000 mg | DELAYED_RELEASE_TABLET | Freq: Every day | ORAL | Status: DC

## 2024-02-05 MED ORDER — NEBIVOLOL HCL 10 MG PO TABS
10.0000 mg | ORAL_TABLET | Freq: Every day | ORAL | Status: DC
  Administered 2024-02-06 – 2024-02-26 (×21): 10 mg via ORAL
  Filled 2024-02-05 (×23): qty 1

## 2024-02-05 MED ORDER — PROCHLORPERAZINE MALEATE 5 MG PO TABS: 5.0000 mg | ORAL_TABLET | Freq: Four times a day (QID) | ORAL | Status: DC | PRN

## 2024-02-05 MED ORDER — AMLODIPINE BESYLATE 5 MG PO TABS
5.0000 mg | ORAL_TABLET | Freq: Every day | ORAL | Status: DC
  Administered 2024-02-06 – 2024-02-08 (×3): 5 mg via ORAL
  Filled 2024-02-05 (×3): qty 1

## 2024-02-05 MED ORDER — ATORVASTATIN CALCIUM 80 MG PO TABS
80.0000 mg | ORAL_TABLET | ORAL | Status: DC
  Administered 2024-02-06 – 2024-03-01 (×21): 80 mg via ORAL
  Filled 2024-02-05 (×22): qty 1

## 2024-02-05 MED ORDER — BISACODYL 10 MG RE SUPP
10.0000 mg | Freq: Every day | RECTAL | Status: DC | PRN
  Administered 2024-02-05: 10 mg via RECTAL

## 2024-02-05 MED ORDER — ACETAMINOPHEN 325 MG PO TABS
325.0000 mg | ORAL_TABLET | ORAL | Status: DC | PRN
  Administered 2024-02-23: 650 mg via ORAL
  Filled 2024-02-05 (×2): qty 2

## 2024-02-05 MED ORDER — CLOPIDOGREL BISULFATE 75 MG PO TABS: 75.0000 mg | ORAL_TABLET | Freq: Every day | ORAL | Status: DC

## 2024-02-05 MED ORDER — INSULIN ASPART 100 UNIT/ML IJ SOLN
0.0000 [IU] | Freq: Three times a day (TID) | INTRAMUSCULAR | Status: DC
  Administered 2024-02-06: 4 [IU] via SUBCUTANEOUS
  Administered 2024-02-06 (×2): 3 [IU] via SUBCUTANEOUS
  Administered 2024-02-07: 4 [IU] via SUBCUTANEOUS
  Administered 2024-02-07 – 2024-02-08 (×3): 3 [IU] via SUBCUTANEOUS
  Administered 2024-02-08 (×2): 4 [IU] via SUBCUTANEOUS
  Administered 2024-02-09 – 2024-02-15 (×11): 3 [IU] via SUBCUTANEOUS
  Filled 2024-02-05: qty 4
  Filled 2024-02-05 (×7): qty 3
  Filled 2024-02-05: qty 4
  Filled 2024-02-05 (×2): qty 3
  Filled 2024-02-05: qty 4
  Filled 2024-02-05 (×7): qty 3
  Filled 2024-02-05: qty 4

## 2024-02-05 MED ORDER — FOLIC ACID 1 MG PO TABS
1.0000 mg | ORAL_TABLET | Freq: Every day | ORAL | Status: DC
  Administered 2024-02-06: 1 mg via ORAL
  Filled 2024-02-05: qty 1

## 2024-02-05 MED ORDER — DIPHENHYDRAMINE HCL 25 MG PO CAPS: 25.0000 mg | ORAL_CAPSULE | Freq: Four times a day (QID) | ORAL | Status: DC | PRN

## 2024-02-05 MED ORDER — ORAL CARE MOUTH RINSE: 15.0000 mL | OROMUCOSAL | Status: DC | PRN

## 2024-02-05 MED ORDER — CHLORTHALIDONE 25 MG PO TABS
25.0000 mg | ORAL_TABLET | Freq: Every day | ORAL | Status: DC
  Administered 2024-02-06 – 2024-02-08 (×3): 25 mg via ORAL
  Filled 2024-02-05 (×4): qty 1

## 2024-02-05 MED ORDER — IRBESARTAN 75 MG PO TABS
37.5000 mg | ORAL_TABLET | Freq: Every day | ORAL | Status: DC
  Administered 2024-02-06 – 2024-02-16 (×11): 37.5 mg via ORAL
  Filled 2024-02-05 (×11): qty 1

## 2024-02-05 MED ORDER — SENNOSIDES-DOCUSATE SODIUM 8.6-50 MG PO TABS
2.0000 | ORAL_TABLET | Freq: Every day | ORAL | Status: DC
  Administered 2024-02-05 – 2024-03-01 (×17): 2 via ORAL
  Filled 2024-02-05 (×25): qty 2

## 2024-02-05 MED ORDER — FLEET ENEMA RE ENEM: 1.0000 | ENEMA | Freq: Once | RECTAL | Status: DC | PRN

## 2024-02-05 MED ORDER — GUAIFENESIN-DM 100-10 MG/5ML PO SYRP: 5.0000 mL | ORAL_SOLUTION | Freq: Four times a day (QID) | ORAL | Status: DC | PRN

## 2024-02-05 MED ORDER — ENOXAPARIN SODIUM 40 MG/0.4ML IJ SOSY
40.0000 mg | PREFILLED_SYRINGE | INTRAMUSCULAR | Status: DC
  Administered 2024-02-06 – 2024-02-20 (×15): 40 mg via SUBCUTANEOUS
  Filled 2024-02-05 (×16): qty 0.4

## 2024-02-05 MED ORDER — ASPIRIN 81 MG PO TBEC
81.0000 mg | DELAYED_RELEASE_TABLET | Freq: Every day | ORAL | Status: AC
  Administered 2024-02-05 – 2024-02-16 (×12): 81 mg via ORAL
  Filled 2024-02-05 (×12): qty 1

## 2024-02-05 MED ORDER — PROCHLORPERAZINE EDISYLATE 10 MG/2ML IJ SOLN: 5.0000 mg | Freq: Four times a day (QID) | INTRAMUSCULAR | Status: DC | PRN

## 2024-02-05 MED ORDER — ALUM & MAG HYDROXIDE-SIMETH 200-200-20 MG/5ML PO SUSP: 30.0000 mL | ORAL | Status: DC | PRN

## 2024-02-05 MED ORDER — VITAMIN C 500 MG PO TABS
1000.0000 mg | ORAL_TABLET | Freq: Every day | ORAL | Status: DC
  Administered 2024-02-05 – 2024-03-02 (×27): 1000 mg via ORAL
  Filled 2024-02-05 (×28): qty 2

## 2024-02-05 MED ORDER — BISACODYL 10 MG RE SUPP
10.0000 mg | Freq: Once | RECTAL | Status: AC
  Administered 2024-02-05: 10 mg via RECTAL
  Filled 2024-02-05: qty 1

## 2024-02-05 MED ORDER — DAPAGLIFLOZIN PROPANEDIOL 10 MG PO TABS
10.0000 mg | ORAL_TABLET | Freq: Every day | ORAL | Status: DC
  Administered 2024-02-06 – 2024-02-18 (×13): 10 mg via ORAL
  Filled 2024-02-05 (×13): qty 1

## 2024-02-05 NOTE — Progress Notes (Signed)
 PMR Admission Coordinator Pre-Admission Assessment   Patient: Marisa [name] is an 64 y.o., female MRN: [id number] DOB: 1960-04-17 Height: 5' (152.4 cm) Weight: 111.1 kg   Insurance Information HMO:     PPO:      PCP:      IPA:      80/20:      OTHER:  PRIMARY: [facility]      Policy#: [id number]      Subscriber: patient CM Name:       Phone#:      Fax#: [phone] and/or [phone] Pre-Cert#: Not required - just submit claim at the time of discharge.  No updates needed.      Employer: Retired Public Affairs Consultant:  Phone #: [phone]     Name:  Eff. Date: 02/25/17 to 12/26/24     Deduct:       Out of Pocket Max: $3000 ($855 met)      Life Max:  CIR: 75%      SNF: 75% Outpatient: 75%     Co-Pay:  Home Health: 75%      Co-Pay:  DME: 75%     Co-Pay:  Providers: in network  SECONDARY:       Policy#:      Phone#:    Artist:       Phone#:    [employer] for patients in Inpatient Rehabilitation Facilities with attached Privacy Act Statement-Health Care Records was provided and verbally reviewed with: Patient   Emergency Contact Information Contact Information       Name Relation Home Work Mobile    Hogg,Jeffrey Sr Spouse [phone]   [phone]         Other Contacts   None on File        Current Medical History  Patient Admitting Diagnosis: L BG infarct   History of Present Illness: A 64 y.o. female with history of HTN, DM, HLD,Obesity, OSA who presented to [facility] on 12/32/25 complaining of altered mental status and weakness, starting in right arm which started on 12/31 around 5 in the afternoon.   Code stroke was activated by EDP.  Evaluated emergently by teleneurology.  CT CTA negative.  Outside of window for TNK treatment.  MRI shows acute basil ganglia infarct. Transferred to [facility] on 01/29/24 for further evaluation.   PT/OT/SLP evaluations completed with recommendations for acute inpatient rehab  admission.   Complete NIHSS TOTAL: 11   Patient's medical record from [facility] has been reviewed by the rehabilitation admission coordinator and physician.   Past Medical History      Past Medical History:  Diagnosis Date   Borderline diabetes     Hypertension            Has the patient had major surgery during 100 days prior to admission? No   Family History   family history includes Diabetes in her mother; Hypertension in her mother.   Current Medications [Current Medications]  [Current Medications]    Current Facility-Administered Medications:    amLODipine  (NORVASC ) tablet 5 mg, 5 mg, Oral, Daily, Ghimire, Shanker M, MD, 5 mg at 02/05/24 0910   aspirin  EC tablet 81 mg, 81 mg, Oral, Daily, Powell, Lisa K, RPH, 81 mg at 02/04/24 2157   atorvastatin  (LIPITOR) tablet 80 mg, 80 mg, Oral, Once per day on Monday Tuesday Wednesday Thursday Friday Saturday, Judithe Longs C, NP, 80 mg at 02/05/24 9089   bisacodyl  (DULCOLAX) suppository 10 mg, 10 mg, Rectal, Daily  PRN, Ghimire, Shanker M, MD, 10 mg at 02/02/24 1652   chlorthalidone  (HYGROTON ) tablet 25 mg, 25 mg, Oral, Daily, Sundil, Subrina, MD, 25 mg at 02/05/24 9081   clopidogrel  (PLAVIX ) tablet 75 mg, 75 mg, Oral, Daily, Sundil, Subrina, MD, 75 mg at 02/05/24 9089   dapagliflozin  propanediol (FARXIGA ) tablet 10 mg, 10 mg, Oral, Daily, Gretel Prentice BIRCH, RPH, 10 mg at 02/05/24 9081   enoxaparin  (LOVENOX ) injection 40 mg, 40 mg, Subcutaneous, Q24H, Sundil, Subrina, MD, 40 mg at 02/05/24 9089   folic acid  (FOLVITE ) tablet 1 mg, 1 mg, Oral, Daily, Cox, Amy N, DO, 1 mg at 02/05/24 0930   hydrALAZINE  (APRESOLINE ) injection 10 mg, 10 mg, Intravenous, Q6H PRN, Ghimire, Donalda HERO, MD   insulin  aspart (novoLOG ) injection 0-20 Units, 0-20 Units, Subcutaneous, TID WC, Cox, Amy N, DO, 3 Units at 02/05/24 0910   insulin  aspart (novoLOG ) injection 0-5 Units, 0-5 Units, Subcutaneous, QHS, Cox, Amy N, DO   irbesartan  (AVAPRO ) tablet 37.5  mg, 37.5 mg, Oral, Daily, Sundil, Subrina, MD, 37.5 mg at 02/05/24 9081   nebivolol  (BYSTOLIC ) tablet 10 mg, 10 mg, Oral, Daily, Sundil, Subrina, MD, 10 mg at 02/05/24 9081   Oral care mouth rinse, 15 mL, Mouth Rinse, PRN, Ghimire, Donalda HERO, MD   polyethylene glycol (MIRALAX  / GLYCOLAX ) packet 17 g, 17 g, Oral, Daily, Ghimire, Donalda HERO, MD, 17 g at 02/05/24 0919   senna-docusate (Senokot-S) tablet 2 tablet, 2 tablet, Oral, QHS, Ghimire, Donalda HERO, MD, 2 tablet at 02/04/24 2157    Patients Current Diet:  Diet Order                  Diet Carb Modified             Diet - low sodium heart healthy             Diet heart healthy/carb modified Room service appropriate? Yes; Fluid consistency: Thin  Diet effective now                         Precautions / Restrictions Precautions Precautions: Fall Precaution/Restrictions Comments: Note flaccid R UE Restrictions Weight Bearing Restrictions Per Provider Order: No    Has the patient had 2 or more falls or a fall with injury in the past year? No   Prior Activity Level Community (5-7x/wk): Went out daily, was driving, not working.   Prior Functional Level Self Care: Did the patient need help bathing, dressing, using the toilet or eating? Independent   Indoor Mobility: Did the patient need assistance with walking from room to room (with or without device)? Independent   Stairs: Did the patient need assistance with internal or external stairs (with or without device)? Independent   Functional Cognition: Did the patient need help planning regular tasks such as shopping or remembering to take medications? Independent   Patient Information Are you of Hispanic, Latino/a,or Spanish origin?: A. No, not of Hispanic, Latino/a, or Spanish origin What is your race?: B. Black or African American Do you need or want an interpreter to communicate with a doctor or health care staff?: 0. No   Patient's Response To:  Health Literacy and  Transportation Is the patient able to respond to health literacy and transportation needs?: Yes Health Literacy - How often do you need to have someone help you when you read instructions, pamphlets, or other written material from your doctor or pharmacy?: Never In the past 12 months, has lack of transportation  kept you from medical appointments or from getting medications?: No In the past 12 months, has lack of transportation kept you from meetings, work, or from getting things needed for daily living?: No   Home Assistive Devices / Equipment Home Equipment: Grab bars - tub/shower   Prior Device Use: Indicate devices/aids used by the patient prior to current illness, exacerbation or injury? None of the above   Current Functional Level Cognition   Arousal/Alertness: Awake/alert Overall Cognitive Status: Impaired/Different from baseline Orientation Level: Oriented X4 Attention: Focused, Sustained Focused Attention: Appears intact Sustained Attention: Appears intact Memory: Impaired Memory Impairment: Decreased short term memory Decreased Short Term Memory: Verbal basic Awareness: Appears intact Problem Solving: Impaired Problem Solving Impairment: Verbal complex Safety/Judgment: Appears intact    Extremity Assessment (includes Sensation/Coordination)   Upper Extremity Assessment: Right hand dominant, RUE deficits/detail RUE Deficits / Details: Flaccid; ? shoulder elevation - trace RUE Sensation: decreased light touch, decreased proprioception RUE Coordination: decreased fine motor, decreased gross motor  Lower Extremity Assessment: Defer to PT evaluation RLE Deficits / Details: No muslce activation noted in L LE despite verbal and tactile cues. Pt reports sensation WNL to light touch. RLE Sensation: WNL LLE Deficits / Details: LLE strength WFL LLE Sensation: WNL LLE Coordination: WNL     ADLs   Overall ADL's : Needs assistance/impaired Eating/Feeding: Set up,  Sitting Grooming: Minimal assistance, Bed level Upper Body Bathing: Moderate assistance, Bed level Lower Body Bathing: Maximal assistance, Bed level, Sit to/from stand Upper Body Dressing : Moderate assistance, Sitting Lower Body Dressing: Maximal assistance, Bed level Toilet Transfer: Maximal assistance, Squat-pivot Toileting- Clothing Manipulation and Hygiene: Maximal assistance, Bed level (encourage use of urinal) Functional mobility during ADLs: Maximal assistance     Mobility   Overal bed mobility: Needs Assistance Bed Mobility: Supine to Sit Supine to sit: Min assist Sit to supine: Max assist General bed mobility comments: Pt uses L LE to push R LE towards EOB. Uses L UE to initiate roll and push trunk to upright position. MinA for trunk assist and management on R LE once over EOB for positioning. Assist to scoot forward EOB.     Transfers   Overall transfer level: Needs assistance Equipment used: Ambulation equipment used Transfers: Sit to/from Stand, Bed to chair/wheelchair/BSC Sit to Stand: Contact guard assist, Via lift equipment Bed to/from chair/wheelchair/BSC transfer type:: Via Lift equipment Squat pivot transfers: Max assist Transfer via Lift Equipment: Stedy General transfer comment: Pt pulls self to standing using L UE and LE with guarding for balance. Pt initially leans forward into steady, improves posture with cues and placement of R UE on stedy bar. TotalA to reposition R LE on stedy in standing. Dependent on stedy for transfer to chair. Improved trunk control while seated and standing.     Ambulation / Gait / Stairs / Wheelchair Mobility   Ambulation/Gait General Gait Details: unable Pre-gait activities: weight shifting in stedy     Posture / Balance Dynamic Sitting Balance Sitting balance - Comments: Pt with improving trunk control while EOB. Supervision for seated balance when feet are supported, CGA/minA when unsupported. Pt able to bring trunk in and out of  COG with seated UE weight bearing exercises. Balance Overall balance assessment: Needs assistance Sitting-balance support: Feet supported, Bilateral upper extremity supported Sitting balance-Leahy Scale: Fair Sitting balance - Comments: Pt with improving trunk control while EOB. Supervision for seated balance when feet are supported, CGA/minA when unsupported. Pt able to bring trunk in and out of COG with seated  UE weight bearing exercises. Postural control: Right lateral lean Standing balance support: Bilateral upper extremity supported, During functional activity, Reliant on assistive device for balance Standing balance-Leahy Scale: Poor Standing balance comment: Standing balance dependent on stedy in standing. Mod-max UE support to remain standing.     Special considerations/life events  Diabetic management borderline diabetic and Special service needs None    Previous Home Environment (from acute therapy documentation) Living Arrangements: Spouse/significant other  Lives With: Spouse Available Help at Discharge: Family, Available 24 hours/day Type of Home: House Home Layout: One level, Laundry or work area in basement Home Access: Stairs to enter Entrance Stairs-Rails: Right Secretary/administrator of Steps: 2 Bathroom Shower/Tub: Health Visitor: Standard Bathroom Accessibility: Yes How Accessible: Accessible via walker Home Care Services: No   Discharge Living Setting Plans for Discharge Living Setting: Patient's home, House, Lives with (comment) (Lives with husband) Type of Home at Discharge: House Discharge Home Layout: Two level, Able to live on main level with bedroom/bathroom, Laundry or work area in basement Alternate Teacher, Music of Steps: Flight (Does not use basement.  Can stay on mail level.) Discharge Home Access: Stairs to enter Entrance Stairs-Rails: Right Entrance Stairs-Number of Steps: 3 Discharge Bathroom Shower/Tub: Walk-in shower,  Door Discharge Bathroom Toilet: Standard Discharge Bathroom Accessibility: Yes How Accessible: Accessible via wheelchair, Accessible via walker Does the patient have any problems obtaining your medications?: No   Social/Family/Support Systems Patient Roles: Spouse Contact Information: Re[place] - spouse Anticipated Caregiver: Husband Anticipated Caregiver's Contact Information: Re[place] - husband - Ability/Limitations of Caregiver: Husband is retired and can Geneticist, Molecular Availability: 24/7 Discharge Plan Discussed with Primary Caregiver: Yes Is Caregiver In Agreement with Plan?: Yes Does Caregiver/Family have Issues with Lodging/Transportation while Pt is in Rehab?: No   Goals Patient/Family Goal for Rehab: PT/OT/SLP supervision to min assist goals Expected length of stay: 12-14 days Pt/Family Agrees to Admission and willing to participate: Yes Program Orientation Provided & Reviewed with Pt/Caregiver Including Roles  & Responsibilities: Yes   Decrease burden of Care through IP rehab admission: N/A   Possible need for SNF placement upon discharge: Not planned   Patient Condition: I have reviewed medical records from [facility], spoken with CM, and patient and spouse. I met with patient at the bedside for inpatient rehabilitation assessment.  Patient will benefit from ongoing PT, OT, and SLP, can actively participate in 3 hours of therapy a day 5 days of the week, and can make measurable gains during the admission.  Patient will also benefit from the coordinated team approach during an Inpatient Acute Rehabilitation admission.  The patient will receive intensive therapy as well as Rehabilitation physician, nursing, social worker, and care management interventions.  Due to bladder management, bowel management, safety, skin/wound care, disease management, medication administration, pain management, and patient education the patient requires 24 hour a day rehabilitation  nursing.  The patient is currently min to max assist with mobility and basic ADLs.  Discharge setting and therapy post discharge at home with outpatient is anticipated.  Patient has agreed to participate in the Acute Inpatient Rehabilitation Program and will admit today.   Preadmission Screen Completed By:  Lovett CHRISTELLA Ropes, 02/05/2024 11:24 AM ______________________________________________________________________   Discussed status with Dr. Babs on 02/05/24 at 0930 and received approval for admission today.   Admission Coordinator:  Lovett CHRISTELLA Ropes, RN, time 1126/Date 02/05/24    Assessment/Plan: Diagnosis: L Basal ganglia stroke Does the need for close, 24 hr/day Medical supervision in concert  with the patient's rehab needs make it unreasonable for this patient to be served in a less intensive setting? Yes Co-Morbidities requiring supervision/potential complications: HTN, HLD, DM; morbid obesity, OSA-  Due to bladder management, bowel management, safety, skin/wound care, disease management, medication administration, pain management, and patient education, does the patient require 24 hr/day rehab nursing? Yes Does the patient require coordinated care of a physician, rehab nurse, PT, OT, and SLP to address physical and functional deficits in the context of the above medical diagnosis(es)? Yes Addressing deficits in the following areas: balance, endurance, locomotion, strength, transferring, bowel/bladder control, bathing, dressing, feeding, grooming, toileting, cognition, and speech Can the patient actively participate in an intensive therapy program of at least 3 hrs of therapy 5 days a week? Yes The potential for patient to make measurable gains while on inpatient rehab is good Anticipated functional outcomes upon discharge from inpatient rehab: supervision and min assist PT, supervision and min assist OT, supervision and min assist SLP Estimated rehab length of stay to reach the above functional  goals is: 12-16 days Anticipated discharge destination: Home 10. Overall Rehab/Functional Prognosis: good     MD Signature:               Revision History  Date/Time User Provider Type Action  02/05/2024 12:29 PM Cornelio Bouchard, MD Physician Sign  02/05/2024 12:18 PM Duanne Lovett HERO, RN Rehab Admission Coordinator Share  02/05/2024 11:26 AM Duanne Lovett HERO, RN Rehab Admission Coordinator Share  02/05/2024 11:26 AM Duanne Lovett HERO, RN Rehab Admission Coordinator Share  02/05/2024 11:23 AM Duanne Lovett HERO, RN Rehab Admission Coordinator Share  02/05/2024  9:59 AM Duanne Lovett HERO, RN Rehab Admission Coordinator Share  02/05/2024  9:24 AM Duanne Lovett HERO, RN Rehab Admission Coordinator Share  02/04/2024  4:57 PM Cecilie Leita KATHEE OVERMAN Rehab Admission Coordinator Share  01/31/2024 11:14 AM Duanne Lovett HERO, RN Rehab Admission Coordinator Share  01/31/2024 11:12 AM Duanne Lovett HERO, RN Rehab Admission Coordinator Share  01/31/2024 11:06 AM Aveen Stansel, Lovett HERO, RN Rehab Admission Coordinator Share   View Details Report

## 2024-02-05 NOTE — Discharge Summary (Signed)
 "  PATIENT DETAILS Name: Marisa [name] Age: 64 y.o. Sex: female Date of Birth: September 18, 1960 MRN: [id number]. Admitting Physician: Greig LOISE Free, DO ERE:Djwizmd, Catheryn, MD  Admit Date: 01/28/2024 Discharge date: 02/05/2024  Recommendations for Outpatient Follow-up:  Follow up with PCP in 1-2 weeks Please obtain CMP/CBC in one week Please ensure outpatient follow-up with neurology. 2.2 cm left thyroid nodule-needs outpatient evaluation at PCP's discretion.  Admitted From:  Home  Disposition: CIR   Discharge Condition: fair  CODE STATUS:   Code Status: Full Code   Diet recommendation:  Diet Order             Diet Carb Modified           Diet - low sodium heart healthy           Diet heart healthy/carb modified Room service appropriate? Yes; Fluid consistency: Thin  Diet effective now                    Brief Summary: Patient is a 64 y.o.  female with history of HTN, HLD, DM-2-presented with right sided weakness-found to have acute CVA.   Significant events: 12/31>> admit to [facility]   Significant studies: 12/02>> A1c: 7.2 12/31>> CTA head/neck: No LVO-or significant stenosis. 01/01>> MRI brain: Left basal ganglia infarct 01/01>> UDS: Negative 01/02>> LDL: 145 01/02>> echo: EF 39-34%, grade 1 diastolic dysfunction 01/03>> TCD with bubble study: No evidence of right-to-left shunt. 01/05>> TEE: Negative bubble study-no thrombus.   Significant microbiology data: None   Procedures: None   Consults: Neurology  Brief Hospital Course: Acute CVA Likely small vessel disease versus cardioembolic Unchanged facial droop/right-sided hemiplegia Workup as above On aspirin /Plavix  x 21 days followed by Plavix  alone On high intensity statin TEE negative for thrombus-negative bubble study CIR planned on discharge   HTN Initially permissive hypertension was allowed Antihypertensives has been gradually started BP now stable-continue  amlodipine /chlorthalidone /Avapro  and Bystolic . Follow/optimize   DM-2 Continue SSI Resume metformin  on discharge  HLD Statin   OSA CPAP nightly   2.2 cm nodule left thyroid Incidental finding on CTA head/neck Stable for outpatient nonemergent thyroid ultrasound-as this is an incidental finding.   Class III obesity: Estimated body mass index is 47.85 kg/m as calculated from the following:   Height as of this encounter: 5' (1.524 m).   Weight as of this encounter: 111.1 kg.   Discharge Diagnoses:  Principal Problem:   Stroke-like symptom Active Problems:   Uncontrolled type 2 diabetes mellitus with hyperglycemia (HCC)   Essential hypertension, benign   Obesity, Class III, BMI 40-49.9 (morbid obesity) (HCC)   Pure hypercholesterolemia   OSA on CPAP   Right sided weakness   Mild tricuspid regurgitation   Discharge Instructions:  Activity:  As tolerated with Full fall precautions use walker/cane & assistance as needed   Discharge Instructions     Ambulatory referral to Neurology   Complete by: As directed    Follow up with Dr. Chalice at [facility] in 4-6 weeks. Pt is Dr. Lionell pt. Thanks.   Call MD for:  difficulty breathing, headache or visual disturbances   Complete by: As directed    Call MD for:  persistant dizziness or light-headedness   Complete by: As directed    Diet - low sodium heart healthy   Complete by: As directed    Diet Carb Modified   Complete by: As directed    Discharge instructions   Complete by: As directed    Follow with  Primary MD  Jarold Medici, MD in 1-2 weeks  Please get a complete blood count and chemistry panel checked by your Primary MD at your next visit, and again as instructed by your Primary MD.  Get Medicines reviewed and adjusted: Please take all your medications with you for your next visit with your Primary MD  Laboratory/radiological data: Please request your Primary MD to go over all hospital tests and  procedure/radiological results at the follow up, please ask your Primary MD to get all Hospital records sent to his/her office.  In some cases, they will be blood work, cultures and biopsy results pending at the time of your discharge. Please request that your primary care M.D. follows up on these results.  Also Note the following: If you experience worsening of your admission symptoms, develop shortness of breath, life threatening emergency, suicidal or homicidal thoughts you must seek medical attention immediately by calling 911 or calling your MD immediately  if symptoms less severe.  You must read complete instructions/literature along with all the possible adverse reactions/side effects for all the Medicines you take and that have been prescribed to you. Take any new Medicines after you have completely understood and accpet all the possible adverse reactions/side effects.   Do not drive when taking Pain medications or sleeping medications (Benzodaizepines)  Do not take more than prescribed Pain, Sleep and Anxiety Medications. It is not advisable to combine anxiety,sleep and pain medications without talking with your primary care practitioner  Special Instructions: If you have smoked or chewed Tobacco  in the last 2 yrs please stop smoking, stop any regular Alcohol  and or any Recreational drug use.  Wear Seat belts while driving.  Please note: You were cared for by a hospitalist during your hospital stay. Once you are discharged, your primary care physician will handle any further medical issues. Please note that NO REFILLS for any discharge medications will be authorized once you are discharged, as it is imperative that you return to your primary care physician (or establish a relationship with a primary care physician if you do not have one) for your post hospital discharge needs so that they can reassess your need for medications and monitor your lab values.   Increase activity slowly    Complete by: As directed       Allergies as of 02/05/2024   No Known Allergies      Medication List     TAKE these medications    Accu-Chek Guide Me w/Device Kit Use as directed to check blood sugars three times daily E11.65   Accu-Chek Guide Test test strip Generic drug: glucose blood USE 1 STRIP TO TEST THREE TIMES A DAY TO CHECK BLOOD SUGARS. (KEEP UNUSED STRIPS IN ORIGINAL SEALED CONTAINER BETWEEN USES)   Accu-Chek Softclix Lancets lancets USE LANCET TO TEST THREE TIMES A DAY - (DISCARD LANCET IN APPROPRIATE CONTAINER IMMEDIATELY AFTER USE. USE A NEW LANCET FOR EACH TESTING)   amLODipine  10 MG tablet Commonly known as: NORVASC  Take 1 tablet (10 mg total) by mouth daily.   aspirin  EC 81 MG tablet Take 1 tablet (81 mg total) by mouth daily for 12 days. Swallow whole.   atorvastatin  80 MG tablet Commonly known as: Lipitor Take one tablet by mouth everyday except sundays What changed: medication strength   chlorthalidone  25 MG tablet Commonly known as: HYGROTON  Take 1 tablet (25 mg total) by mouth daily.   clopidogrel  75 MG tablet Commonly known as: PLAVIX  Take 1 tablet (75 mg  total) by mouth daily. Start taking on: February 06, 2024   dapagliflozin  propanediol 10 MG Tabs tablet Commonly known as: FARXIGA  TAKE ONE TABLET BY MOUTH EVERY DAY BEFORE BREAKFAST   folic acid  1 MG tablet Commonly known as: FOLVITE  TAKE ONE TABLET BY MOUTH EVERY DAY   Linzess  145 MCG Caps capsule Generic drug: linaclotide  TAKE ONE CAPSULE BY MOUTH EVERY DAY BEFORE FIRST MEAL OF THE DAY What changed: See the new instructions.   metFORMIN  1000 MG tablet Commonly known as: GLUCOPHAGE  Take one tab po twice daily   nebivolol  10 MG tablet Commonly known as: Bystolic  Take 1 tablet (10 mg total) by mouth daily.   olmesartan  40 MG tablet Commonly known as: BENICAR  Take 1 tablet (40 mg total) by mouth daily.   polyethylene glycol 17 g packet Commonly known as: MIRALAX  /  GLYCOLAX  Take 17 g by mouth daily. Start taking on: February 06, 2024   senna-docusate 8.6-50 MG tablet Commonly known as: Senokot-S Take 2 tablets by mouth at bedtime.   VITAMIN D3 PO Take 1 tablet by mouth daily.        Follow-up Information     Dohmeier, Dedra, MD. Schedule an appointment as soon as possible for a visit in 1 month(s).   Specialty: Neurology Contact information: [address] [place] [address] [phone]         Jarold Medici, MD. Schedule an appointment as soon as possible for a visit in 1 week(s).   Specialty: Internal Medicine Contact information: [address] [place] [address] [place] [address] [phone]                Allergies[1]   Other Procedures/Studies: ECHO TEE Result Date: 02/02/2024    TRANSESOPHOGEAL ECHO REPORT   Patient Name:   Marisa [name] Date of Exam: 02/02/2024 Medical Rec #:  [id number]            Height:       60.0 in Accession #:    [id number]           Weight:       245.0 lb Date of Birth:  Dec 26, 1960           BSA:          2.035 m Patient Age:    63 years             BP:           141/70 mmHg Patient Gender: F                    HR:           75 bpm. Exam Location:  Inpatient Procedure: Transesophageal Echo, 3D Echo, Cardiac Doppler, Color Doppler and            Saline Contrast Bubble Study (Both Spectral and Color Flow Doppler            were utilized during procedure). Indications:     Stroke  History:         Patient has prior history of Echocardiogram examinations, most                  recent 01/30/2024. TIA and Stroke; Risk Factors:Hypertension and                  Diabetes.  Sonographer:     Philomena Daring Referring Phys:  [id number] HAO MENG Diagnosing Phys: Stanly Leavens MD PROCEDURE: After discussion of the risks  and benefits of a TEE, an informed consent was obtained from the patient. The transesophogeal probe was passed without difficulty through the esophogus of the patient. Imaged  were obtained with the patient in a left lateral decubitus position. Sedation performed by different physician. The patient was monitored while under deep sedation. Anesthestetic sedation was provided intravenously by Anesthesiology: 150mg  of Propofol , 100mg  of Lidocaine . The patient developed no complications during the procedure.  IMPRESSIONS  1. Left ventricular ejection fraction, by estimation, is 60 to 65%. Left ventricular ejection fraction by 3D volume is 63 %. The left ventricle has normal function.  2. Right ventricular systolic function is normal. The right ventricular size is normal.  3. No left atrial/left atrial appendage thrombus was detected. The LAA emptying velocity was 25 cm/s.  4. The mitral valve is normal in structure. Trivial mitral valve regurgitation. No evidence of mitral stenosis.  5. The aortic valve is tricuspid. Aortic valve regurgitation is not visualized. No aortic stenosis is present.  6. Agitated saline contrast bubble study was negative, with no evidence of any interatrial shunt.  7. 3D performed of the mitral valve and demonstrates Trivial Mitral regurgitation. FINDINGS  Left Ventricle: Left ventricular ejection fraction, by estimation, is 60 to 65%. Left ventricular ejection fraction by 3D volume is 63 %. The left ventricle has normal function. The left ventricular internal cavity size was normal in size. Right Ventricle: The right ventricular size is normal. No increase in right ventricular wall thickness. Right ventricular systolic function is normal. Left Atrium: Left atrial size was normal in size. No left atrial/left atrial appendage thrombus was detected. The LAA emptying velocity was 25 cm/s. Right Atrium: Right atrial size was normal in size. Pericardium: There is no evidence of pericardial effusion. Mitral Valve: The mitral valve is normal in structure. Trivial mitral valve regurgitation. No evidence of mitral valve stenosis. Tricuspid Valve: Possible bifid posterior  leaflet. The tricuspid valve is grossly normal. Tricuspid valve regurgitation is mild . No evidence of tricuspid stenosis. Aortic Valve: The aortic valve is tricuspid. Aortic valve regurgitation is not visualized. No aortic stenosis is present. Aortic valve peak gradient measures 3.3 mmHg. Pulmonic Valve: The pulmonic valve was grossly normal. Pulmonic valve regurgitation is not visualized. No evidence of pulmonic stenosis. Aorta: Artifact in the aorta. The aortic root, ascending aorta, aortic arch and descending aorta are all structurally normal, with no evidence of dilitation or obstruction. Pulmonary Artery: The pulmonary artery is of normal size. Venous: The left upper pulmonary vein, left lower pulmonary vein, right upper pulmonary vein and right lower pulmonary vein are normal. IAS/Shunts: No atrial level shunt detected by color flow Doppler. Agitated saline contrast was given intravenously to evaluate for intracardiac shunting. Agitated saline contrast bubble study was negative, with no evidence of any interatrial shunt. Additional Comments: 3D was performed not requiring image post processing on an independent workstation and was normal. Spectral Doppler performed.  3D Volume EF LV 3D EF:    Left ventricular ejection fraction by 3D volume is 63              %. LV 3D EDV:   64.89 ml LV 3D ESV:   24.10 ml  3D Volume EF: 3D EF:        63 % AORTIC VALVE AV Vmax:      91.00 cm/s AV Peak Grad: 3.3 mmHg Stanly Leavens MD Electronically signed by Stanly Leavens MD Signature Date/Time: 02/02/2024/11:51:44 AM    Final    EP STUDY Result  Date: 02/02/2024 See surgical note for result.  VAS US  TRANSCRANIAL DOPPLER W BUBBLES Result Date: 02/01/2024  Transcranial Doppler with Bubble Patient Name:  Marisa [name]  Date of Exam:   01/31/2024 Medical Rec #: [id number]             Accession #:    [id number] Date of Birth: 1960-06-27            Patient Gender: F Patient Age:   32 years Exam Location:  [facility] Procedure:      VAS US  TRANSCRANIAL DOPPLER W BUBBLES Referring Phys: ROCKY LIKES --------------------------------------------------------------------------------  Indications: Stroke. Limitations for diagnostic windows: Unable to insonate bilateral transtemporal or orbital windows. Comparison Study: No prior study on file Performing Technologist: Alberta Lis RVS  Examination Guidelines: A complete evaluation includes B-mode imaging, spectral Doppler, color Doppler, and power Doppler as needed of all accessible portions of each vessel. Bilateral testing is considered an integral part of a complete examination. Limited examinations for reoccurring indications may be performed as noted.  Summary: No HITS at rest or during Valsalva. Negative transcranial Doppler Bubble study with no evidence of right to left intracardiac communication.  A vascular evaluation was performed. The vertebral was studied. An IV was inserted into the patient's right Forearm. Verbal informed consent was obtained.  NEGATIVE TCD Bubble study with no evidence of right to left shunt noted.   Diagnosing physician: Eather Popp MD Electronically signed by Eather Popp MD on 02/01/2024 at 10:38:12 AM.    Final    ECHOCARDIOGRAM COMPLETE Result Date: 01/30/2024    ECHOCARDIOGRAM REPORT   Patient Name:   Marisa [name] Date of Exam: 01/30/2024 Medical Rec #:  [id number]            Height:       60.0 in Accession #:    [id number]           Weight:       245.0 lb Date of Birth:  December 15, 1960           BSA:          2.035 m Patient Age:    63 years             BP:           167/92 mmHg Patient Gender: F                    HR:           71 bpm. Exam Location:  Inpatient Procedure: 2D Echo, Cardiac Doppler and Color Doppler (Both Spectral and Color            Flow Doppler were utilized during procedure). Indications:    Stroke and TIA  History:        Patient has prior history of Echocardiogram examinations, most                 recent  04/16/2022. Risk Factors:Hypertension and Diabetes.  Sonographer:    Sherlean Dubin Referring Phys: [id number] AMY N COX  Sonographer Comments: Image acquisition challenging due to patient body habitus and Image acquisition challenging due to respiratory motion. IMPRESSIONS  1. Left ventricular ejection fraction, by estimation, is 65 to 70%. The left ventricle has normal function. The left ventricle has no regional wall motion abnormalities. There is mild concentric left ventricular hypertrophy. Left ventricular diastolic parameters are consistent with Grade I diastolic dysfunction (impaired relaxation).  2. Right ventricular systolic function is normal. The  right ventricular size is normal.  3. The mitral valve is normal in structure. No evidence of mitral valve regurgitation. No evidence of mitral stenosis.  4. The aortic valve is normal in structure. Aortic valve regurgitation is not visualized. No aortic stenosis is present.  5. The inferior vena cava is normal in size with greater than 50% respiratory variability, suggesting right atrial pressure of 3 mmHg. FINDINGS  Left Ventricle: Left ventricular ejection fraction, by estimation, is 65 to 70%. The left ventricle has normal function. The left ventricle has no regional wall motion abnormalities. The left ventricular internal cavity size was normal in size. There is  mild concentric left ventricular hypertrophy. Left ventricular diastolic parameters are consistent with Grade I diastolic dysfunction (impaired relaxation). Right Ventricle: The right ventricular size is normal. No increase in right ventricular wall thickness. Right ventricular systolic function is normal. Left Atrium: Left atrial size was normal in size. Right Atrium: Right atrial size was normal in size. Pericardium: There is no evidence of pericardial effusion. Mitral Valve: The mitral valve is normal in structure. No evidence of mitral valve regurgitation. No evidence of mitral valve stenosis. MV  peak gradient, 3.3 mmHg. The mean mitral valve gradient is 1.0 mmHg. Tricuspid Valve: The tricuspid valve is normal in structure. Tricuspid valve regurgitation is trivial. No evidence of tricuspid stenosis. Aortic Valve: The aortic valve is normal in structure. Aortic valve regurgitation is not visualized. No aortic stenosis is present. Aortic valve mean gradient measures 3.5 mmHg. Aortic valve peak gradient measures 6.2 mmHg. Aortic valve area, by VTI measures 2.20 cm. Pulmonic Valve: The pulmonic valve was normal in structure. Pulmonic valve regurgitation is not visualized. No evidence of pulmonic stenosis. Aorta: The aortic root is normal in size and structure. Venous: The inferior vena cava is normal in size with greater than 50% respiratory variability, suggesting right atrial pressure of 3 mmHg. IAS/Shunts: No atrial level shunt detected by color flow Doppler.  LEFT VENTRICLE PLAX 2D LVIDd:         4.70 cm   Diastology LVIDs:         3.50 cm   LV e' medial:    4.20 cm/s LV PW:         1.10 cm   LV E/e' medial:  12.9 LV IVS:        1.00 cm   LV e' lateral:   5.44 cm/s LVOT diam:     2.00 cm   LV E/e' lateral: 9.9 LV SV:         58 LV SV Index:   29 LVOT Area:     3.14 cm  RIGHT VENTRICLE             IVC RV Basal diam:  3.60 cm     IVC diam: 1.30 cm RV Mid diam:    3.30 cm RV S prime:     13.80 cm/s TAPSE (M-mode): 2.1 cm LEFT ATRIUM           Index        RIGHT ATRIUM           Index LA diam:      2.50 cm 1.23 cm/m   RA Area:     16.10 cm LA Vol (A2C): 53.1 ml 26.10 ml/m  RA Volume:   39.40 ml  19.36 ml/m LA Vol (A4C): 43.5 ml 21.38 ml/m  AORTIC VALVE AV Area (Vmax):    2.45 cm AV Area (Vmean):   2.29 cm AV Area (  VTI):     2.20 cm AV Vmax:           124.50 cm/s AV Vmean:          84.500 cm/s AV VTI:            0.265 m AV Peak Grad:      6.2 mmHg AV Mean Grad:      3.5 mmHg LVOT Vmax:         97.25 cm/s LVOT Vmean:        61.700 cm/s LVOT VTI:          0.186 m LVOT/AV VTI ratio: 0.70  AORTA Ao Root  diam: 2.70 cm Ao Asc diam:  3.20 cm MITRAL VALVE               TRICUSPID VALVE MV Area (PHT): 2.56 cm    TR Peak grad:   9.1 mmHg MV Area VTI:   2.58 cm    TR Vmax:        151.00 cm/s MV Peak grad:  3.3 mmHg MV Mean grad:  1.0 mmHg    SHUNTS MV Vmax:       0.91 m/s    Systemic VTI:  0.19 m MV Vmean:      52.6 cm/s   Systemic Diam: 2.00 cm MV Decel Time: 296 msec MR Peak grad: 6.4 mmHg MR Vmax:      126.00 cm/s MV E velocity: 54.00 cm/s MV A velocity: 78.40 cm/s MV E/A ratio:  0.69 Marisa [name] Electronically signed by Marisa [name] Signature Date/Time: 01/30/2024/11:10:46 AM    Final    MR BRAIN WO CONTRAST Result Date: 01/29/2024 EXAM: MRI BRAIN WITHOUT CONTRAST 01/29/2024 05:44:05 PM TECHNIQUE: Multiplanar multisequence MRI of the head/brain was performed without the administration of intravenous contrast. COMPARISON: CT head and CTA head and neck 01/28/2024. CLINICAL HISTORY: Neuro deficit, acute, stroke suspected. FINDINGS: BRAIN AND VENTRICLES: There is a 2.9 x 1.5 cm focus of restricted diffusion centered in the left basal ganglia compatible with acute infarct. There is mild associated edema. No significant mass effect on the ventricles. No midline shift. No hydrocephalus. No intracranial hemorrhage. No mass. T2 and FLAIR hyperintensity throughout the periventricular and subcortical white matter suggestive of mild to moderate chronic microvascular ischemic changes. Cerebral volume within normal limits for patients age. The sella is unremarkable. Normal flow voids. ORBITS: No acute abnormality. SINUSES AND MASTOIDS: No acute abnormality. BONES AND SOFT TISSUES: Normal marrow signal. No acute soft tissue abnormality. IMPRESSION: 1. Acute infarct in the left basal ganglia with mild associated edema. No midline shift. Electronically signed by: Marisa Mania MD 01/29/2024 06:13 PM EST RP Workstation: [id number]   CT ANGIO HEAD NECK W WO CM (CODE STROKE) Result Date: 01/28/2024 EXAM: CTA HEAD AND NECK WITH  01/28/2024 11:23:40 PM TECHNIQUE: CTA of the head and neck was performed with the administration of 75 mL of iohexol  (OMNIPAQUE ) 350 MG/ML injection. Multiplanar 2D and/or 3D reformatted images are provided for review. Automated exposure control, iterative reconstruction, and/or weight based adjustment of the mA/kV was utilized to reduce the radiation dose to as low as reasonably achievable. Stenosis of the internal carotid arteries measured using NASCET criteria. COMPARISON: None available CLINICAL HISTORY: Neuro deficit, acute, stroke suspected. FINDINGS: CTA NECK: AORTIC ARCH AND ARCH VESSELS: No dissection or arterial injury. No significant stenosis of the brachiocephalic or subclavian arteries. CERVICAL CAROTID ARTERIES: No dissection, arterial injury, or hemodynamically significant stenosis by NASCET criteria. CERVICAL VERTEBRAL ARTERIES: No dissection, arterial injury,  or significant stenosis. LUNGS AND MEDIASTINUM: Unremarkable. SOFT TISSUES: Enlarged thyroid gland with a 2.2 cm hypodense nodule in the left lobe. BONES: No acute abnormality. CTA HEAD: ANTERIOR CIRCULATION: No significant stenosis of the internal carotid arteries. No significant stenosis of the anterior cerebral arteries. No significant stenosis of the middle cerebral arteries. No aneurysm. POSTERIOR CIRCULATION: No significant stenosis of the posterior cerebral arteries. No significant stenosis of the basilar artery. No significant stenosis of the vertebral arteries. No aneurysm. OTHER: No dural venous sinus thrombosis on this non-dedicated study. IMPRESSION: 1. No large vessel occlusion, hemodynamically significant stenosis, or aneurysm in the head or neck. 2. Enlarged thyroid gland with 2.2 cm hypodense nodule in the left lobe; recommend non-emergent thyroid ultrasound. Electronically signed by: Franky Stanford MD 01/28/2024 11:32 PM EST RP Workstation: [id number]   CT HEAD CODE STROKE WO CONTRAST (LKW 0-4.5h, LVO 0-24h) Result Date:  01/28/2024 EXAM: CT HEAD WITHOUT CONTRAST 01/28/2024 11:06:46 PM TECHNIQUE: CT of the head was performed without the administration of intravenous contrast. Automated exposure control, iterative reconstruction, and/or weight based adjustment of the mA/kV was utilized to reduce the radiation dose to as low as reasonably achievable. COMPARISON: None available. CLINICAL HISTORY: Neuro deficit, acute, stroke suspected. FINDINGS: BRAIN AND VENTRICLES: No acute hemorrhage. No evidence of acute infarct. The [place] Stroke Program Early CT Score (ASPECTS) is 10. No hydrocephalus. No extra-axial collection. No mass effect or midline shift. ORBITS: No acute abnormality. SINUSES: No acute abnormality. SOFT TISSUES AND SKULL: No acute soft tissue abnormality. No skull fracture. IMPRESSION: 1. No acute intracranial abnormality. 2. ASPECTS score is 10. Communicated to Dr. Fairy Gravely at 11:10 pm on 01/28/2024 Electronically signed by: Franky Stanford MD 01/28/2024 11:11 PM EST RP Workstation: [id number]     TODAY-DAY OF DISCHARGE:  Subjective:   Marisa [name] today has no headache,no chest abdominal pain,no new weakness tingling or numbness, feels much better wants to go home today.   Objective:   Blood pressure (!) 123/56, pulse (!) 57, temperature 98 F (36.7 C), temperature source Oral, resp. rate 20, height 5' (1.524 m), weight 111.1 kg, SpO2 95%.  Intake/Output Summary (Last 24 hours) at 02/05/2024 1024 Last data filed at 02/04/2024 2200 Gross per 24 hour  Intake --  Output 1600 ml  Net -1600 ml   Filed Weights   01/28/24 2247  Weight: 111.1 kg    Exam: Awake Alert, Oriented *3, No new F.N deficits, Normal affect [place].AT,PERRAL Supple Neck,No JVD, No cervical lymphadenopathy appriciated.  Symmetrical Chest wall movement, Good air movement bilaterally, CTAB RRR,No Gallops,Rubs or new Murmurs, No Parasternal Heave +ve B.Sounds, Abd Soft, Non tender, No organomegaly appriciated, No rebound  -guarding or rigidity. No Cyanosis, Clubbing or edema, No new Rash or bruise   PERTINENT RADIOLOGIC STUDIES: No results found.   PERTINENT LAB RESULTS: CBC: No results for input(s): WBC, HGB, HCT, PLT in the last 72 hours. CMET CMP     Component Value Date/Time   NA 137 01/30/2024 0446   NA 138 12/30/2023 1143   K 3.6 01/30/2024 0446   CL 100 01/30/2024 0446   CO2 23 01/30/2024 0446   GLUCOSE 141 (H) 01/30/2024 0446   BUN 11 01/30/2024 0446   BUN 13 12/30/2023 1143   CREATININE 0.74 01/30/2024 0446   CALCIUM  9.4 01/30/2024 0446   PROT 7.8 01/28/2024 2253   PROT 7.5 12/30/2023 1143   ALBUMIN 4.2 01/28/2024 2253   ALBUMIN 4.1 12/30/2023 1143   AST 17 01/28/2024 2253   ALT 10  01/28/2024 2253   ALKPHOS 95 01/28/2024 2253   BILITOT 0.5 01/28/2024 2253   BILITOT 0.4 12/30/2023 1143   EGFR 85 12/30/2023 1143   GFRNONAA >60 01/30/2024 0446    GFR Estimated Creatinine Clearance: 80.4 mL/min (by C-G formula based on SCr of 0.74 mg/dL). No results for input(s): LIPASE, AMYLASE in the last 72 hours. No results for input(s): CKTOTAL, CKMB, CKMBINDEX, TROPONINI in the last 72 hours. Invalid input(s): POCBNP No results for input(s): DDIMER in the last 72 hours. No results for input(s): HGBA1C in the last 72 hours. No results for input(s): CHOL, HDL, LDLCALC, TRIG, CHOLHDL, LDLDIRECT in the last 72 hours. No results for input(s): TSH, T4TOTAL, T3FREE, THYROIDAB in the last 72 hours.  Invalid input(s): FREET3 No results for input(s): VITAMINB12, FOLATE, FERRITIN, TIBC, IRON, RETICCTPCT in the last 72 hours. Coags: No results for input(s): INR in the last 72 hours.  Invalid input(s): PT Microbiology: No results found for this or any previous visit (from the past 240 hours).  FURTHER DISCHARGE INSTRUCTIONS:  Get Medicines reviewed and adjusted: Please take all your medications with you for your next visit with  your Primary MD  Laboratory/radiological data: Please request your Primary MD to go over all hospital tests and procedure/radiological results at the follow up, please ask your Primary MD to get all Hospital records sent to his/her office.  In some cases, they will be blood work, cultures and biopsy results pending at the time of your discharge. Please request that your primary care M.D. goes through all the records of your hospital data and follows up on these results.  Also Note the following: If you experience worsening of your admission symptoms, develop shortness of breath, life threatening emergency, suicidal or homicidal thoughts you must seek medical attention immediately by calling 911 or calling your MD immediately  if symptoms less severe.  You must read complete instructions/literature along with all the possible adverse reactions/side effects for all the Medicines you take and that have been prescribed to you. Take any new Medicines after you have completely understood and accpet all the possible adverse reactions/side effects.   Do not drive when taking Pain medications or sleeping medications (Benzodaizepines)  Do not take more than prescribed Pain, Sleep and Anxiety Medications. It is not advisable to combine anxiety,sleep and pain medications without talking with your primary care practitioner  Special Instructions: If you have smoked or chewed Tobacco  in the last 2 yrs please stop smoking, stop any regular Alcohol  and or any Recreational drug use.  Wear Seat belts while driving.  Please note: You were cared for by a hospitalist during your hospital stay. Once you are discharged, your primary care physician will handle any further medical issues. Please note that NO REFILLS for any discharge medications will be authorized once you are discharged, as it is imperative that you return to your primary care physician (or establish a relationship with a primary care physician if you  do not have one) for your post hospital discharge needs so that they can reassess your need for medications and monitor your lab values.  Total Time spent coordinating discharge including counseling, education and face to face time equals greater than 30 minutes.  SignedBETHA Donalda Applebaum 02/05/2024 10:24 AM      [1] No Known Allergies  "

## 2024-02-05 NOTE — TOC Transition Note (Signed)
 Transition of Care [facility]) - Discharge Note   Patient Details  Name: Marisa [name] MRN: [id number] Date of Birth: 1960-03-09  [facility] CM/SW Contact:  Landry DELENA Senters, RN Phone Number: 02/05/2024, 10:22 AM   Clinical Narrative:    Patient will be discharging to CIR today.  No further needs identified by CM.   Final next level of care: IP Rehab Facility Barriers to Discharge: No Barriers Identified   Patient Goals and CMS Choice Patient states their goals for this hospitalization and ongoing recovery are:: Rehab   Choice offered to / list presented to : Patient      Discharge Placement                       Discharge Plan and Services Additional resources added to the After Visit Summary for   In-house Referral: Clinical Social Work   Post Acute Care Choice: IP Rehab                               Social Drivers of Health (SDOH) Interventions SDOH Screenings   Food Insecurity: No Food Insecurity (01/29/2024)  Housing: Low Risk (01/29/2024)  Transportation Needs: No Transportation Needs (01/29/2024)  Utilities: Not At Risk (01/29/2024)  Alcohol Screen: Low Risk (10/09/2023)  Depression (PHQ2-9): Low Risk (11/18/2023)  Financial Resource Strain: Low Risk (10/09/2023)  Physical Activity: Inactive (10/09/2023)  Social Connections: Socially Integrated (10/09/2023)  Stress: Stress Concern Present (10/09/2023)  Tobacco Use: Low Risk (01/28/2024)     Readmission Risk Interventions     No data to display

## 2024-02-05 NOTE — Progress Notes (Signed)
 Physical Therapy Treatment Patient Details Name: Marisa [name] MRN: [id number] DOB: [birth year] Today's Date: 02/05/2024   History of Present Illness 64 y.o female presents to [facility] on 12/31 with R sided weakness and slurred speech. CT (-). 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 PMH: HTN, DM2, HLD.    PT Comments  Pt continues to demonstrate improved mechanics for bed mobility using L UE and LE. Pt completes supine to sit transfer with minA for light trunk assist and R LE positioning once EOB. Pt without trunk lean sitting EOB and is able to lean in/out of BOS without loss of balance. Weight bearing encouraged through R UE in sitting to promote improved proprioception. STS completed on sara stedy with CGA for balance. Pt with forward trunk lean upon standing and requires minA and cues to reposition. Pt dependent on sara stedy for standing and transfers due to flaccid R UE and LE. STS x3 completed with 2-3 minutes standing each.  Pt would benefit from continued PT services focused on bed mobility, ROM, strength, and transfers to progress functional mobility. Pt appropriate for 3hrs rehab upon discharge in order to optimize functional gains. Pt anticipated to transfer to IR today.   If plan is discharge home, recommend the following: Two people to help with walking and/or transfers;Two people to help with bathing/dressing/bathroom;Assistance with cooking/housework;Direct supervision/assist for medications management;Direct supervision/assist for financial management;Assist for transportation;Help with stairs or ramp for entrance   Can travel by private vehicle        Equipment Recommendations  Rolling walker (2 wheels);BSC/3in1;Wheelchair (measurements PT);Wheelchair cushion (measurements PT);Hoyer lift    Recommendations for Other Services       Precautions / Restrictions Precautions Precautions: Fall Recall of Precautions/Restrictions: Intact Precaution/Restrictions Comments: Note flaccid  R UE Restrictions Weight Bearing Restrictions Per Provider Order: No     Mobility  Bed Mobility Overal bed mobility: Needs Assistance Bed Mobility: Supine to Sit     Supine to sit: Min assist     General bed mobility comments: Pt uses L LE to push R LE towards EOB. Uses L UE to initiate roll and push trunk to upright position. MinA for trunk assist and management on R LE once over EOB for positioning. Assist to scoot forward EOB.    Transfers Overall transfer level: Needs assistance Equipment used: Ambulation equipment used Transfers: Sit to/from Stand, Bed to chair/wheelchair/BSC Sit to Stand: Contact guard assist, Via lift equipment           General transfer comment: Pt pulls self to standing using L UE and LE with guarding for balance. Pt initially leans forward into steady, improves posture with cues and placement of R UE on stedy bar. TotalA to reposition R LE on stedy in standing. Dependent on stedy for transfer to chair. Improved trunk control while seated and standing. Transfer via Lift Equipment: Stedy  Ambulation/Gait                   Stairs             Wheelchair Mobility     Tilt Bed    Modified Rankin (Stroke Patients Only)       Balance Overall balance assessment: Needs assistance Sitting-balance support: Feet supported, Bilateral upper extremity supported Sitting balance-Leahy Scale: Fair Sitting balance - Comments: Pt with improving trunk control while EOB. Supervision for seated balance when feet are supported, CGA/minA when unsupported. Pt able to bring trunk in and out of COG with seated UE weight bearing  exercises.   Standing balance support: Bilateral upper extremity supported, During functional activity, Reliant on assistive device for balance Standing balance-Leahy Scale: Poor Standing balance comment: Standing balance dependent on stedy in standing. Mod-max UE support to remain standing.                             Communication Communication Communication: Impaired Factors Affecting Communication: Difficulty expressing self;Reduced clarity of speech  Cognition Arousal: Alert Behavior During Therapy: WFL for tasks assessed/performed   PT - Cognitive impairments: No apparent impairments                         Following commands: Intact      Cueing Cueing Techniques: Verbal cues, Tactile cues, Visual cues  Exercises General Exercises - Upper Extremity Shoulder Flexion: Self ROM, Both, 5 reps, Seated Elbow Flexion: Self ROM, Right, 5 reps, Seated Elbow Extension: Self ROM, Right, 5 reps, Seated Other Exercises Other Exercises: Weight bearing through R UE 3x30s seated    General Comments General comments (skin integrity, edema, etc.): VSS throughout. No new skin abnormalities noted.      Pertinent Vitals/Pain Pain Assessment Pain Assessment: No/denies pain    Home Living                          Prior Function            PT Goals (current goals can now be found in the care plan section) Acute Rehab PT Goals Patient Stated Goal: None stated Progress towards PT goals: Progressing toward goals    Frequency    Min 3X/week      PT Plan      Co-evaluation              AM-PAC PT 6 Clicks Mobility   Outcome Measure  Help needed turning from your back to your side while in a flat bed without using bedrails?: None Help needed moving from lying on your back to sitting on the side of a flat bed without using bedrails?: A Little Help needed moving to and from a bed to a chair (including a wheelchair)?: Total Help needed standing up from a chair using your arms (e.g., wheelchair or bedside chair)?: A Little Help needed to walk in hospital room?: Total Help needed climbing 3-5 steps with a railing? : Total 6 Click Score: 13    End of Session   Activity Tolerance: Patient limited by fatigue Patient left: in chair;with call bell/phone within  reach;with chair alarm set Nurse Communication: Mobility status PT Visit Diagnosis: Unsteadiness on feet (R26.81);Hemiplegia and hemiparesis;Other abnormalities of gait and mobility (R26.89) Hemiplegia - Right/Left: Right Hemiplegia - dominant/non-dominant: Dominant Hemiplegia - caused by: Cerebral infarction     Time: 8958-8888 PT Time Calculation (min) (ACUTE ONLY): 30 min  Charges:    $Therapeutic Activity: 8-22 mins $Neuromuscular Re-education: 8-22 mins PT General Charges $$ ACUTE PT VISIT: 1 Visit                     Sabra Morel, PT, DPT  Acute Rehabilitation Services         Office: [phone]      Sabra MARLA Morel 02/05/2024, 11:25 AM

## 2024-02-05 NOTE — Plan of Care (Signed)
 " Problem: Self-Care: Goal: Ability to participate in self-care as condition permits will improve Outcome: Progressing Goal: Verbalization of feelings and concerns over difficulty with self-care will improve Outcome: Progressing Goal: Ability to communicate needs accurately will improve Outcome: Progressing   Problem: Nutrition: Goal: Risk of aspiration will decrease Outcome: Progressing Goal: Dietary intake will improve Outcome: Progressing   Problem: Education: Goal: Ability to describe self-care measures that may prevent or decrease complications (Diabetes Survival Skills Education) will improve Outcome: Progressing Goal: Individualized Educational Video(s) Outcome: Progressing   Problem: Coping: Goal: Ability to adjust to condition or change in health will improve Outcome: Progressing   Problem: Fluid Volume: Goal: Ability to maintain a balanced intake and output will improve Outcome: Progressing   Problem: Health Behavior/Discharge Planning: Goal: Ability to identify and utilize available resources and services will improve Outcome: Progressing Goal: Ability to manage health-related needs will improve Outcome: Progressing   Problem: Metabolic: Goal: Ability to maintain appropriate glucose levels will improve Outcome: Progressing   Problem: Nutritional: Goal: Maintenance of adequate nutrition will improve Outcome: Progressing Goal: Progress toward achieving an optimal weight will improve Outcome: Progressing   Problem: Skin Integrity: Goal: Risk for impaired skin integrity will decrease Outcome: Progressing   Problem: Tissue Perfusion: Goal: Adequacy of tissue perfusion will improve Outcome: Progressing   Problem: Education: Goal: Knowledge of disease or condition will improve Outcome: Progressing Goal: Knowledge of secondary prevention will improve (MUST DOCUMENT ALL) Outcome: Progressing Goal: Knowledge of patient specific risk factors will improve  (DELETE if not current risk factor) Outcome: Progressing   Problem: Ischemic Stroke/TIA Tissue Perfusion: Goal: Complications of ischemic stroke/TIA will be minimized Outcome: Progressing   Problem: Coping: Goal: Will verbalize positive feelings about self Outcome: Progressing Goal: Will identify appropriate support needs Outcome: Progressing   Problem: Health Behavior/Discharge Planning: Goal: Ability to manage health-related needs will improve Outcome: Progressing Goal: Goals will be collaboratively established with patient/family Outcome: Progressing   Problem: Self-Care: Goal: Ability to participate in self-care as condition permits will improve Outcome: Progressing Goal: Verbalization of feelings and concerns over difficulty with self-care will improve Outcome: Progressing Goal: Ability to communicate needs accurately will improve Outcome: Progressing   Problem: Nutrition: Goal: Risk of aspiration will decrease Outcome: Progressing Goal: Dietary intake will improve Outcome: Progressing   Problem: Education: Goal: Knowledge of General Education information will improve Description: Including pain rating scale, medication(s)/side effects and non-pharmacologic comfort measures Outcome: Progressing   Problem: Health Behavior/Discharge Planning: Goal: Ability to manage health-related needs will improve Outcome: Progressing   Problem: Clinical Measurements: Goal: Ability to maintain clinical measurements within normal limits will improve Outcome: Progressing Goal: Will remain free from infection Outcome: Progressing Goal: Diagnostic test results will improve Outcome: Progressing Goal: Respiratory complications will improve Outcome: Progressing Goal: Cardiovascular complication will be avoided Outcome: Progressing   Problem: Activity: Goal: Risk for activity intolerance will decrease Outcome: Progressing   Problem: Nutrition: Goal: Adequate nutrition will be  maintained Outcome: Progressing   Problem: Coping: Goal: Level of anxiety will decrease Outcome: Progressing   Problem: Elimination: Goal: Will not experience complications related to bowel motility Outcome: Progressing Goal: Will not experience complications related to urinary retention Outcome: Progressing   Problem: Pain Managment: Goal: General experience of comfort will improve and/or be controlled Outcome: Progressing   Problem: Safety: Goal: Ability to remain free from injury will improve Outcome: Progressing   Problem: Skin Integrity: Goal: Risk for impaired skin integrity will decrease Outcome: Progressing   Problem: Education: Goal: Knowledge of disease  or condition will improve Outcome: Progressing Goal: Knowledge of secondary prevention will improve (MUST DOCUMENT ALL) Outcome: Progressing Goal: Knowledge of patient specific risk factors will improve (DELETE if not current risk factor) Outcome: Progressing   Problem: Intracerebral Hemorrhage Tissue Perfusion: Goal: Complications of Intracerebral Hemorrhage will be minimized Outcome: Progressing   Problem: Coping: Goal: Will verbalize positive feelings about self Outcome: Progressing Goal: Will identify appropriate support needs Outcome: Progressing   Problem: Health Behavior/Discharge Planning: Goal: Ability to manage health-related needs will improve Outcome: Progressing Goal: Goals will be collaboratively established with patient/family Outcome: Progressing   Problem: Self-Care: Goal: Ability to participate in self-care as condition permits will improve Outcome: Progressing Goal: Verbalization of feelings and concerns over difficulty with self-care will improve Outcome: Progressing Goal: Ability to communicate needs accurately will improve Outcome: Progressing   Problem: Nutrition: Goal: Risk of aspiration will decrease Outcome: Progressing Goal: Dietary intake will improve Outcome:  Progressing   "

## 2024-02-05 NOTE — H&P (Signed)
 "   Physical Medicine and Rehabilitation Admission H&P    Chief Complaint  Patient presents with   Functional deficits due to CVA   HPI: Marisa [name] is a 64 year old female with PMHx HTN, DM2, sleep apnea, morbid obesity, and HLD presented to [facility] ED on 01/28/24 with concerns of left upper and lower extremity weakness with difficulty ambulating. Patient reported that symptoms began around 0300. Upon arrival Code stroke was activated and patient was emergently evaluated by teleneurology.  CT was negative.  She was outside the window for TNK. Labs in ED Na 140, potassium 3.1, chloride 100, bicarb 23, BUN 12, serum creatinine 0.79, eGFR greater than 60, glucose 132, WBC 8.6, hemoglobin 13, platelets 273. Neurology consulted and clopidogrel  bolus initiated along with aspirin . Patient was transferred to [facility] for ongoing management. The patient underwent MRI showed large 2.5 cm left subcortical infarct. CT angiogram showed no LVO. Echo EF 65 to 70%, TCD bubble study negative. Right hemiplegia felt due to large left subcortical infarct likely cryptogenic etiology.  Patient was placed on Aspirin  and Plavix  for stroke prevention for 3 weeks followed by Plavix  alone. An incidental finding of a 2.2 cm left thyroid nodule found on CTA head/neck was deemed to be stable and should be followed up on in outpatient setting.   Prior to arrival the patient was independent and driving, she does not work. She lives in a one level home with 2 steps to enter with spouse and requires max assist for functional mobility during ADLs and total assist for repositioning and uses stedy for transfers to chair. Therapy evaluations completed due to patient decreased functional mobility was admitted for a comprehensive rehab program.  Pt reports  feeling constipated- LBM 2 days ago- wants something that works- had forgotten to drink her miralax  this morning, drinking now.  Willing to do suppository, if  it works.    Only Sx's of stroke really is R hemiplegia and R facial numbness, no N/T otherwise    Using purewick to void- but doesn't think has urgency or frequency.  Live sin home with 1 story with basement 3 STE Husband can care for her 24/7- is retired and in decent health- has DM himself.    Review of Systems  Constitutional:  Positive for malaise/fatigue.  HENT: Negative.    Eyes: Negative.  Negative for blurred vision and double vision.  Respiratory: Negative.  Negative for cough and shortness of breath.   Cardiovascular: Negative.  Negative for chest pain and leg swelling.  Gastrointestinal:  Positive for constipation. Negative for nausea and vomiting.       LBM 2 days ago and feels bad  Genitourinary: Negative.  Negative for dysuria, frequency and urgency.  Musculoskeletal: Negative.  Negative for back pain, joint pain and myalgias.  Skin: Negative.   Neurological:  Positive for sensory change, speech change, focal weakness and weakness.  Psychiatric/Behavioral:  Negative for memory loss. The patient does not have insomnia.   All other systems reviewed and are negative.       Past Medical History:  Diagnosis Date   Borderline diabetes    Hypertension    Past Surgical History:  Procedure Laterality Date   ABDOMINAL HYSTERECTOMY     CESAREAN SECTION     SHOULDER SURGERY     TRANSESOPHAGEAL ECHOCARDIOGRAM (CATH LAB) N/A 02/02/2024   Procedure: TRANSESOPHAGEAL ECHOCARDIOGRAM;  Surgeon: Santo Stanly LABOR, MD;  Location: [facility] INVASIVE CV LAB;  Service: Cardiovascular;  Laterality: N/A;  Family History  Problem Relation Age of Onset   Diabetes Mother    Hypertension Mother    Social History:  reports that she has never smoked. She has never used smokeless tobacco. She reports that she does not drink alcohol and does not use drugs. Allergies: Allergies[1] Medications Prior to Admission  Medication Sig Dispense Refill   amLODipine  (NORVASC ) 10 MG tablet Take 1  tablet (10 mg total) by mouth daily. 90 tablet 2   atorvastatin  (LIPITOR) 40 MG tablet Take one tablet by mouth everyday except sundays 90 tablet 1   chlorthalidone  (HYGROTON ) 25 MG tablet Take 1 tablet (25 mg total) by mouth daily. 90 tablet 3   Cholecalciferol  (VITAMIN D3 PO) Take 1 tablet by mouth daily.     dapagliflozin  propanediol (FARXIGA ) 10 MG TABS tablet TAKE ONE TABLET BY MOUTH EVERY DAY BEFORE BREAKFAST 90 tablet 2   folic acid  (FOLVITE ) 1 MG tablet TAKE ONE TABLET BY MOUTH EVERY DAY 90 tablet 1   LINZESS  145 MCG CAPS capsule TAKE ONE CAPSULE BY MOUTH EVERY DAY BEFORE FIRST MEAL OF THE DAY (Patient taking differently: Take 145 mcg by mouth as needed.) 90 capsule 1   metFORMIN  (GLUCOPHAGE ) 1000 MG tablet Take one tab po twice daily 180 tablet 2   nebivolol  (BYSTOLIC ) 10 MG tablet Take 1 tablet (10 mg total) by mouth daily. 90 tablet 2   olmesartan  (BENICAR ) 40 MG tablet Take 1 tablet (40 mg total) by mouth daily. 90 tablet 3   ACCU-CHEK GUIDE TEST test strip USE 1 STRIP TO TEST THREE TIMES A DAY TO CHECK BLOOD SUGARS. (KEEP UNUSED STRIPS IN ORIGINAL SEALED CONTAINER BETWEEN USES) 100 each 2   Accu-Chek Softclix Lancets lancets USE LANCET TO TEST THREE TIMES A DAY - (DISCARD LANCET IN APPROPRIATE CONTAINER IMMEDIATELY AFTER USE. USE A NEW LANCET FOR EACH TESTING) 100 each 2   Blood Glucose Monitoring Suppl (ACCU-CHEK GUIDE ME) w/Device KIT Use as directed to check blood sugars three times daily E11.65 1 kit 1      Home: Home Living Family/patient expects to be discharged to:: Private residence Living Arrangements: Spouse/significant other Available Help at Discharge: Family, Available 24 hours/day Type of Home: House Home Access: Stairs to enter Secretary/administrator of Steps: 2 Entrance Stairs-Rails: Right Home Layout: One level, Laundry or work area in basement [employer] Shower/Tub: Health Visitor: Administrator Accessibility: Yes Home Equipment: Grab  bars - tub/shower  Lives With: Spouse   Functional History: Prior Function Prior Level of Function : Independent/Modified Independent, Driving  Functional Status:  Mobility: Bed Mobility Overal bed mobility: Needs Assistance Bed Mobility: Supine to Sit Supine to sit: Mod assist, HOB elevated, Used rails Sit to supine: Max assist General bed mobility comments: Pt OOB upon entry Transfers Overall transfer level: Needs assistance Equipment used: Ambulation equipment used Transfers: Bed to chair/wheelchair/BSC, Sit to/from Stand Sit to Stand: +2 physical assistance, Min assist, +2 safety/equipment, Contact guard assist Bed to/from chair/wheelchair/BSC transfer type:: Via Lift equipment Squat pivot transfers: Max Banker via Lift Equipment: [employer] transfer comment: stood from recliner with min A +2 and from stedy pads with CGA; stedy used to transfer to/from [facility] Ambulation/Gait General Gait Details: unable Pre-gait activities: weight shifting in stedy    ADL: ADL Overall ADL's : Needs assistance/impaired Eating/Feeding: Set up, Sitting Grooming: Minimal assistance, Bed level Upper Body Bathing: Moderate assistance, Bed level Lower Body Bathing: Maximal assistance, Bed level, Sit to/from stand Upper Body Dressing : Moderate assistance, Sitting  Lower Body Dressing: Maximal assistance, Bed level Toilet Transfer: Maximal assistance, Squat-pivot Toileting- Clothing Manipulation and Hygiene: Maximal assistance, Bed level (encourage use of urinal) Functional mobility during ADLs: Maximal assistance  Cognition: Cognition Overall Cognitive Status: Impaired/Different from baseline Arousal/Alertness: Awake/alert Orientation Level: Oriented X4 Year: 2026 Month: January Day of Week: Correct Attention: Focused, Sustained Focused Attention: Appears intact Sustained Attention: Appears intact Memory: Impaired Memory Impairment: Decreased short term memory Decreased  Short Term Memory: Verbal basic Awareness: Appears intact Problem Solving: Impaired Problem Solving Impairment: Verbal complex Safety/Judgment: Appears intact Cognition Arousal: Alert Behavior During Therapy: WFL for tasks assessed/performed Overall Cognitive Status: Impaired/Different from baseline  Physical Exam: Blood pressure (!) 123/56, pulse (!) 57, temperature 98 F (36.7 C), temperature source Oral, resp. rate 20, height 5' (1.524 m), weight 111.1 kg, SpO2 95%. Physical Exam Vitals and nursing note reviewed. Exam conducted with a chaperone present.  Constitutional:      Appearance: Normal appearance. She is obese.     Comments: Pt awake, alert, appropriate, sitting in bedside chair; husband and initially her 2 adult children in room, NAD  HENT:     Head: Normocephalic and atraumatic.     Comments: R facial droop Tongue midline R facial numbness- mild     Right Ear: External ear normal.     Left Ear: External ear normal.     Nose: Nose normal. No congestion.     Mouth/Throat:     Mouth: Mucous membranes are moist.     Pharynx: Oropharynx is clear. No oropharyngeal exudate.  Eyes:     General:        Right eye: No discharge.        Left eye: No discharge.     Extraocular Movements: Extraocular movements intact.     Comments: R lid lag  Cardiovascular:     Rate and Rhythm: Normal rate and regular rhythm.     Heart sounds: Normal heart sounds. No murmur heard.    No gallop.  Pulmonary:     Effort: Pulmonary effort is normal. No respiratory distress.     Breath sounds: Normal breath sounds. No wheezing, rhonchi or rales.  Abdominal:     General: There is no distension.     Palpations: Abdomen is soft.     Tenderness: There is no abdominal tenderness.     Comments: Protuberant vs mild distension Hypoactive BS  Musculoskeletal:     Cervical back: Neck supple.     Comments: RUE 0/5 throughout LUE 5/5 in Biceps, triceps, WE, grip and FA RLE 0/5 throughout LLE- 5/5  in HF,KE, KF, DF and PF   Skin:    General: Skin is warm and dry.     Comments: IV L AC fossa- looks somewhat moist- will ask for nurse to remove- no infiltration seen  Neurological:     Mental Status: She is alert.     Comments: Decreased to light touch on R face B/L intact to light touch in a 4 extremities  Knew 1/26- thought was the 7th, not the 8th, but knew was Thursday Thought next holiday is President's day in February, forgot MLK/Valentine's day  Flaccid RUE/RLE Naming and repetition intact No hoffman's, no clonus     Results for orders placed or performed during the hospital encounter of 01/28/24 (from the past 48 hours)  Glucose, capillary     Status: Abnormal   Collection Time: 02/03/24 11:14 AM  Result Value Ref Range   Glucose-Capillary 222 (H) 70 - 99  mg/dL    Comment: Glucose reference range applies only to samples taken after fasting for at least 8 hours.  Glucose, capillary     Status: Abnormal   Collection Time: 02/03/24  3:59 PM  Result Value Ref Range   Glucose-Capillary 131 (H) 70 - 99 mg/dL    Comment: Glucose reference range applies only to samples taken after fasting for at least 8 hours.  Glucose, capillary     Status: Abnormal   Collection Time: 02/03/24  9:06 PM  Result Value Ref Range   Glucose-Capillary 171 (H) 70 - 99 mg/dL    Comment: Glucose reference range applies only to samples taken after fasting for at least 8 hours.  Glucose, capillary     Status: Abnormal   Collection Time: 02/04/24  8:01 AM  Result Value Ref Range   Glucose-Capillary 145 (H) 70 - 99 mg/dL    Comment: Glucose reference range applies only to samples taken after fasting for at least 8 hours.  Glucose, capillary     Status: Abnormal   Collection Time: 02/04/24 12:50 PM  Result Value Ref Range   Glucose-Capillary 190 (H) 70 - 99 mg/dL    Comment: Glucose reference range applies only to samples taken after fasting for at least 8 hours.  Glucose, capillary     Status:  Abnormal   Collection Time: 02/04/24  4:33 PM  Result Value Ref Range   Glucose-Capillary 198 (H) 70 - 99 mg/dL    Comment: Glucose reference range applies only to samples taken after fasting for at least 8 hours.  Glucose, capillary     Status: Abnormal   Collection Time: 02/04/24  9:18 PM  Result Value Ref Range   Glucose-Capillary 153 (H) 70 - 99 mg/dL    Comment: Glucose reference range applies only to samples taken after fasting for at least 8 hours.  Glucose, capillary     Status: Abnormal   Collection Time: 02/05/24  8:11 AM  Result Value Ref Range   Glucose-Capillary 146 (H) 70 - 99 mg/dL    Comment: Glucose reference range applies only to samples taken after fasting for at least 8 hours.   No results found.    Blood pressure (!) 123/56, pulse (!) 57, temperature 98 F (36.7 C), temperature source Oral, resp. rate 20, height 5' (1.524 m), weight 111.1 kg, SpO2 95%.  Medical Problem List and Plan: 1. 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  Antithrombotics: -DVT/anticoagulation:  Mechanical:  Antiembolism stockings, knee (TED hose) Bilateral lower extremities Pharmaceutical: Lovenox   -antiplatelet therapy: Aspirin  and Plavix  x 3 weeks then Plavix  alone.   3. Pain Management: Tylenol  prn   4. Mood/Behavior/Sleep: LCSW to follow for evaluation and support when available.   -antipsychotic agents: N/A  5. Neuropsych/cognition: This patient is capable of making decisions on her own behalf.  6. Skin/Wound Care: Routine pressure relief measures.   7. Fluids/Electrolytes/Nutrition: Monitor I&O and weight. Follow up labs CBC/CMP     8. Acute CVA: likely small vessel disease vs cardioembolic. Continue Asa and Plavix  with statin. Follow up with Dr. Chalice in 4 weeks.   9. Hypertension:  Permissive hypertension allowed now on antihypertensives. BP stable. Continue Amlodipine , Chlorthalidone , Avapro  and Bystolic .  Monitor BP with activity.   10. DM2: A1c 7.2, Home metformin  held, resumed  Farxiga . Monitor glucose ac/hs with SSI and meal time coverage.    11. HLD: Lipitor 80 mg daily   12. OSA: CPAP nightly   13. Left Thyroid Nodule: 2.2 cm thyroid nodule found on CT.  Follow up outpatient for non emergent thyroid US .   14. Class 4 Obesity:  BMI is 47.85 kg/m . Educate on diet and weight loss to promote overall health and mobility.   15. Constipation- will order Miralax  that she's taking as well as Dulcolax suppository tonight to help her get back on schedule. - She also wants apple juice.     Marisa LOISE Satterfield, NP 02/05/2024  I have personally performed a face to face diagnostic evaluation of this patient and formulated the key components of the plan.  Additionally, I have personally reviewed laboratory data, imaging studies, as well as relevant notes and concur with the physician assistant's documentation above.   The patient's status has not changed from the original H&P.  Any changes in documentation from the acute care chart have been noted above.        [1] No Known Allergies  "

## 2024-02-05 NOTE — H&P (Signed)
 "     Physical Medicine and Rehabilitation Admission H&P        Chief Complaint  Patient presents with   Functional deficits due to CVA    HPI: Marisa [name] is a 64 year old female with PMHx HTN, DM2, sleep apnea, morbid obesity, and HLD presented to [facility] ED on 01/28/24 with concerns of left upper and lower extremity weakness with difficulty ambulating. Patient reported that symptoms began around 0300. Upon arrival Code stroke was activated and patient was emergently evaluated by teleneurology.  CT was negative.  She was outside the window for TNK. Labs in ED Na 140, potassium 3.1, chloride 100, bicarb 23, BUN 12, serum creatinine 0.79, eGFR greater than 60, glucose 132, WBC 8.6, hemoglobin 13, platelets 273. Neurology consulted and clopidogrel  bolus initiated along with aspirin . Patient was transferred to [facility] for ongoing management. The patient underwent MRI showed large 2.5 cm left subcortical infarct. CT angiogram showed no LVO. Echo EF 65 to 70%, TCD bubble study negative. Right hemiplegia felt due to large left subcortical infarct likely cryptogenic etiology.  Patient was placed on Aspirin  and Plavix  for stroke prevention for 3 weeks followed by Plavix  alone. An incidental finding of a 2.2 cm left thyroid nodule found on CTA head/neck was deemed to be stable and should be followed up on in outpatient setting.    Prior to arrival the patient was independent and driving, she does not work. She lives in a one level home with 2 steps to enter with spouse and requires max assist for functional mobility during ADLs and total assist for repositioning and uses stedy for transfers to chair. Therapy evaluations completed due to patient decreased functional mobility was admitted for a comprehensive rehab program.   Pt reports  feeling constipated- LBM 2 days ago- wants something that works- had forgotten to drink her miralax  this morning, drinking now.  Willing to do  suppository, if it works.     Only Sx's of stroke really is R hemiplegia and R facial numbness, no N/T otherwise     Using purewick to void- but doesn't think has urgency or frequency.   Live sin home with 1 story with basement 3 STE Husband can care for her 24/7- is retired and in decent health- has DM himself.      Review of Systems  Constitutional:  Positive for malaise/fatigue.  HENT: Negative.    Eyes: Negative.  Negative for blurred vision and double vision.  Respiratory: Negative.  Negative for cough and shortness of breath.   Cardiovascular: Negative.  Negative for chest pain and leg swelling.  Gastrointestinal:  Positive for constipation. Negative for nausea and vomiting.       LBM 2 days ago and feels bad  Genitourinary: Negative.  Negative for dysuria, frequency and urgency.  Musculoskeletal: Negative.  Negative for back pain, joint pain and myalgias.  Skin: Negative.   Neurological:  Positive for sensory change, speech change, focal weakness and weakness.  Psychiatric/Behavioral:  Negative for memory loss. The patient does not have insomnia.   All other systems reviewed and are negative.                Past Medical History:  Diagnosis Date   Borderline diabetes     Hypertension               Past Surgical History:  Procedure Laterality Date   ABDOMINAL HYSTERECTOMY       CESAREAN SECTION  SHOULDER SURGERY       TRANSESOPHAGEAL ECHOCARDIOGRAM (CATH LAB) N/A 02/02/2024    Procedure: TRANSESOPHAGEAL ECHOCARDIOGRAM;  Surgeon: Santo Stanly LABOR, MD;  Location: [facility] INVASIVE CV LAB;  Service: Cardiovascular;  Laterality: N/A;             Family History  Problem Relation Age of Onset   Diabetes Mother     Hypertension Mother          Social History:  reports that she has never smoked. She has never used smokeless tobacco. She reports that she does not drink alcohol and does not use drugs. Allergies: [Allergies]  [Allergies] No Known  Allergies       Medications Prior to Admission  Medication Sig Dispense Refill   amLODipine  (NORVASC ) 10 MG tablet Take 1 tablet (10 mg total) by mouth daily. 90 tablet 2   atorvastatin  (LIPITOR) 40 MG tablet Take one tablet by mouth everyday except sundays 90 tablet 1   chlorthalidone  (HYGROTON ) 25 MG tablet Take 1 tablet (25 mg total) by mouth daily. 90 tablet 3   Cholecalciferol  (VITAMIN D3 PO) Take 1 tablet by mouth daily.       dapagliflozin  propanediol (FARXIGA ) 10 MG TABS tablet TAKE ONE TABLET BY MOUTH EVERY DAY BEFORE BREAKFAST 90 tablet 2   folic acid  (FOLVITE ) 1 MG tablet TAKE ONE TABLET BY MOUTH EVERY DAY 90 tablet 1   LINZESS  145 MCG CAPS capsule TAKE ONE CAPSULE BY MOUTH EVERY DAY BEFORE FIRST MEAL OF THE DAY (Patient taking differently: Take 145 mcg by mouth as needed.) 90 capsule 1   metFORMIN  (GLUCOPHAGE ) 1000 MG tablet Take one tab po twice daily 180 tablet 2   nebivolol  (BYSTOLIC ) 10 MG tablet Take 1 tablet (10 mg total) by mouth daily. 90 tablet 2   olmesartan  (BENICAR ) 40 MG tablet Take 1 tablet (40 mg total) by mouth daily. 90 tablet 3   ACCU-CHEK GUIDE TEST test strip USE 1 STRIP TO TEST THREE TIMES A DAY TO CHECK BLOOD SUGARS. (KEEP UNUSED STRIPS IN ORIGINAL SEALED CONTAINER BETWEEN USES) 100 each 2   Accu-Chek Softclix Lancets lancets USE LANCET TO TEST THREE TIMES A DAY - (DISCARD LANCET IN APPROPRIATE CONTAINER IMMEDIATELY AFTER USE. USE A NEW LANCET FOR EACH TESTING) 100 each 2   Blood Glucose Monitoring Suppl (ACCU-CHEK GUIDE ME) w/Device KIT Use as directed to check blood sugars three times daily E11.65 1 kit 1              Home: Home Living Family/patient expects to be discharged to:: Private residence Living Arrangements: Spouse/significant other Available Help at Discharge: Family, Available 24 hours/day Type of Home: House Home Access: Stairs to enter Secretary/administrator of Steps: 2 Entrance Stairs-Rails: Right Home Layout: One level, Laundry or  work area in basement [employer] Shower/Tub: Health Visitor: Administrator Accessibility: Yes Home Equipment: Grab bars - tub/shower  Lives With: Spouse   Functional History: Prior Function Prior Level of Function : Independent/Modified Independent, Driving   Functional Status:  Mobility: Bed Mobility Overal bed mobility: Needs Assistance Bed Mobility: Supine to Sit Supine to sit: Mod assist, HOB elevated, Used rails Sit to supine: Max assist General bed mobility comments: Pt OOB upon entry Transfers Overall transfer level: Needs assistance Equipment used: Ambulation equipment used Transfers: Bed to chair/wheelchair/BSC, Sit to/from Stand Sit to Stand: +2 physical assistance, Min assist, +2 safety/equipment, Contact guard assist Bed to/from chair/wheelchair/BSC transfer type:: Via Lift equipment Squat pivot transfers: Max Banker via  Lift Equipment: Stedy General transfer comment: stood from recliner with min A +2 and from stedy pads with CGA; stedy used to transfer to/from [facility] Ambulation/Gait General Gait Details: unable Pre-gait activities: weight shifting in stedy   ADL: ADL Overall ADL's : Needs assistance/impaired Eating/Feeding: Set up, Sitting Grooming: Minimal assistance, Bed level Upper Body Bathing: Moderate assistance, Bed level Lower Body Bathing: Maximal assistance, Bed level, Sit to/from stand Upper Body Dressing : Moderate assistance, Sitting Lower Body Dressing: Maximal assistance, Bed level Toilet Transfer: Maximal assistance, Squat-pivot Toileting- Clothing Manipulation and Hygiene: Maximal assistance, Bed level (encourage use of urinal) Functional mobility during ADLs: Maximal assistance   Cognition: Cognition Overall Cognitive Status: Impaired/Different from baseline Arousal/Alertness: Awake/alert Orientation Level: Oriented X4 Year: 2026 Month: January Day of Week: Correct Attention: Focused, Sustained Focused  Attention: Appears intact Sustained Attention: Appears intact Memory: Impaired Memory Impairment: Decreased short term memory Decreased Short Term Memory: Verbal basic Awareness: Appears intact Problem Solving: Impaired Problem Solving Impairment: Verbal complex Safety/Judgment: Appears intact Cognition Arousal: Alert Behavior During Therapy: WFL for tasks assessed/performed Overall Cognitive Status: Impaired/Different from baseline   Physical Exam: Blood pressure (!) 123/56, pulse (!) 57, temperature 98 F (36.7 C), temperature source Oral, resp. rate 20, height 5' (1.524 m), weight 111.1 kg, SpO2 95%. Physical Exam Vitals and nursing note reviewed. Exam conducted with a chaperone present.  Constitutional:      Appearance: Normal appearance. She is obese.     Comments: Pt awake, alert, appropriate, sitting in bedside chair; husband and initially her 2 adult children in room, NAD  HENT:     Head: Normocephalic and atraumatic.     Comments: R facial droop Tongue midline R facial numbness- mild      Right Ear: External ear normal.     Left Ear: External ear normal.     Nose: Nose normal. No congestion.     Mouth/Throat:     Mouth: Mucous membranes are moist.     Pharynx: Oropharynx is clear. No oropharyngeal exudate.  Eyes:     General:        Right eye: No discharge.        Left eye: No discharge.     Extraocular Movements: Extraocular movements intact.     Comments: R lid lag  Cardiovascular:     Rate and Rhythm: Normal rate and regular rhythm.     Heart sounds: Normal heart sounds. No murmur heard.    No gallop.  Pulmonary:     Effort: Pulmonary effort is normal. No respiratory distress.     Breath sounds: Normal breath sounds. No wheezing, rhonchi or rales.  Abdominal:     General: There is no distension.     Palpations: Abdomen is soft.     Tenderness: There is no abdominal tenderness.     Comments: Protuberant vs mild distension Hypoactive BS  Musculoskeletal:      Cervical back: Neck supple.     Comments: RUE 0/5 throughout LUE 5/5 in Biceps, triceps, WE, grip and FA RLE 0/5 throughout LLE- 5/5 in HF,KE, KF, DF and PF   Skin:    General: Skin is warm and dry.     Comments: IV L AC fossa- looks somewhat moist- will ask for nurse to remove- no infiltration seen  Neurological:     Mental Status: She is alert.     Comments: Decreased to light touch on R face B/L intact to light touch in a 4 extremities  Knew 1/26- thought was  the 7th, not the 8th, but knew was Thursday Thought next holiday is President's day in February, forgot MLK/Valentine's day   Flaccid RUE/RLE Naming and repetition intact No hoffman's, no clonus       Lab Results Last 48 Hours        Results for orders placed or performed during the hospital encounter of 01/28/24 (from the past 48 hours)  Glucose, capillary     Status: Abnormal    Collection Time: 02/03/24 11:14 AM  Result Value Ref Range    Glucose-Capillary 222 (H) 70 - 99 mg/dL      Comment: Glucose reference range applies only to samples taken after fasting for at least 8 hours.  Glucose, capillary     Status: Abnormal    Collection Time: 02/03/24  3:59 PM  Result Value Ref Range    Glucose-Capillary 131 (H) 70 - 99 mg/dL      Comment: Glucose reference range applies only to samples taken after fasting for at least 8 hours.  Glucose, capillary     Status: Abnormal    Collection Time: 02/03/24  9:06 PM  Result Value Ref Range    Glucose-Capillary 171 (H) 70 - 99 mg/dL      Comment: Glucose reference range applies only to samples taken after fasting for at least 8 hours.  Glucose, capillary     Status: Abnormal    Collection Time: 02/04/24  8:01 AM  Result Value Ref Range    Glucose-Capillary 145 (H) 70 - 99 mg/dL      Comment: Glucose reference range applies only to samples taken after fasting for at least 8 hours.  Glucose, capillary     Status: Abnormal    Collection Time: 02/04/24 12:50 PM  Result  Value Ref Range    Glucose-Capillary 190 (H) 70 - 99 mg/dL      Comment: Glucose reference range applies only to samples taken after fasting for at least 8 hours.  Glucose, capillary     Status: Abnormal    Collection Time: 02/04/24  4:33 PM  Result Value Ref Range    Glucose-Capillary 198 (H) 70 - 99 mg/dL      Comment: Glucose reference range applies only to samples taken after fasting for at least 8 hours.  Glucose, capillary     Status: Abnormal    Collection Time: 02/04/24  9:18 PM  Result Value Ref Range    Glucose-Capillary 153 (H) 70 - 99 mg/dL      Comment: Glucose reference range applies only to samples taken after fasting for at least 8 hours.  Glucose, capillary     Status: Abnormal    Collection Time: 02/05/24  8:11 AM  Result Value Ref Range    Glucose-Capillary 146 (H) 70 - 99 mg/dL      Comment: Glucose reference range applies only to samples taken after fasting for at least 8 hours.      Imaging Results (Last 48 hours)  No results found.         Blood pressure (!) 123/56, pulse (!) 57, temperature 98 F (36.7 C), temperature source Oral, resp. rate 20, height 5' (1.524 m), weight 111.1 kg, SpO2 95%.   Medical Problem List and Plan: 1. 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  Antithrombotics: -DVT/anticoagulation:  Mechanical:  Antiembolism stockings, knee (TED hose) Bilateral lower extremities Pharmaceutical: Lovenox              -antiplatelet therapy: Aspirin  and Plavix  x 3 weeks then Plavix  alone.    3. Pain Management: Tylenol  prn    4. Mood/Behavior/Sleep: LCSW to follow for evaluation and support when available.              -antipsychotic agents: N/A   5. Neuropsych/cognition: This patient is capable of making decisions on her own  behalf.   6. Skin/Wound Care: Routine pressure relief measures.    7. Fluids/Electrolytes/Nutrition: Monitor I&O and weight. Follow up labs CBC/CMP     8. Acute CVA: likely small vessel disease vs cardioembolic. Continue Asa and Plavix  with statin. Follow up with Dr. Chalice in 4 weeks.    9. Hypertension: Permissive hypertension allowed now on antihypertensives. BP stable. Continue Amlodipine , Chlorthalidone , Avapro  and Bystolic .  Monitor BP with activity.    10. DM2: A1c 7.2, Home metformin  held, resumed Farxiga . Monitor glucose ac/hs with SSI and meal time coverage.     11. HLD: Lipitor 80 mg daily    12. OSA: CPAP nightly    13. Left Thyroid Nodule: 2.2 cm thyroid nodule found on CT.  Follow up outpatient for non emergent thyroid US .    14. Class 4 Obesity:  BMI is 47.85 kg/m . Educate on diet and weight loss to promote overall health and mobility.    15. Constipation- will order Miralax  that she's taking as well as Dulcolax suppository tonight to help her get back on schedule. - She also wants apple juice.         Daphne LOISE Satterfield, NP 02/05/2024   I have personally performed a face to face diagnostic evaluation of this patient and formulated the key components of the plan.  Additionally, I have personally reviewed laboratory data, imaging studies, as well as relevant notes and concur with the physician assistant's documentation above.   The patient's status has not changed from the original H&P.  Any changes in documentation from the acute care chart have been noted above.       "

## 2024-02-05 NOTE — Plan of Care (Signed)
" °  Problem: Education: Goal: Ability to describe self-care measures that may prevent or decrease complications (Diabetes Survival Skills Education) will improve Outcome: Progressing Goal: Individualized Educational Video(s) Outcome: Progressing   Problem: Coping: Goal: Ability to adjust to condition or change in health will improve Outcome: Progressing   Problem: Fluid Volume: Goal: Ability to maintain a balanced intake and output will improve Outcome: Progressing   Problem: Health Behavior/Discharge Planning: Goal: Ability to identify and utilize available resources and services will improve Outcome: Progressing Goal: Ability to manage health-related needs will improve Outcome: Progressing   Problem: Metabolic: Goal: Ability to maintain appropriate glucose levels will improve Outcome: Progressing   Problem: Nutritional: Goal: Maintenance of adequate nutrition will improve Outcome: Progressing Goal: Progress toward achieving an optimal weight will improve Outcome: Progressing   Problem: Skin Integrity: Goal: Risk for impaired skin integrity will decrease Outcome: Progressing   Problem: Tissue Perfusion: Goal: Adequacy of tissue perfusion will improve Outcome: Progressing   Problem: Education: Goal: Knowledge of disease or condition will improve Outcome: Progressing Goal: Knowledge of secondary prevention will improve (MUST DOCUMENT ALL) Outcome: Progressing Goal: Knowledge of patient specific risk factors will improve (DELETE if not current risk factor) Outcome: Progressing   Problem: Ischemic Stroke/TIA Tissue Perfusion: Goal: Complications of ischemic stroke/TIA will be minimized Outcome: Progressing   Problem: Coping: Goal: Will verbalize positive feelings about self Outcome: Progressing Goal: Will identify appropriate support needs Outcome: Progressing   Problem: Health Behavior/Discharge Planning: Goal: Ability to manage health-related needs will  improve Outcome: Progressing Goal: Goals will be collaboratively established with patient/family Outcome: Progressing   Problem: Self-Care: Goal: Ability to participate in self-care as condition permits will improve Outcome: Progressing Goal: Verbalization of feelings and concerns over difficulty with self-care will improve Outcome: Progressing Goal: Ability to communicate needs accurately will improve Outcome: Progressing   Problem: Nutrition: Goal: Risk of aspiration will decrease Outcome: Progressing Goal: Dietary intake will improve Outcome: Progressing   Problem: Education: Goal: Knowledge of General Education information will improve Description: Including pain rating scale, medication(s)/side effects and non-pharmacologic comfort measures Outcome: Progressing   Problem: Health Behavior/Discharge Planning: Goal: Ability to manage health-related needs will improve Outcome: Progressing   Problem: Clinical Measurements: Goal: Ability to maintain clinical measurements within normal limits will improve Outcome: Progressing Goal: Will remain free from infection Outcome: Progressing Goal: Diagnostic test results will improve Outcome: Progressing Goal: Respiratory complications will improve Outcome: Progressing Goal: Cardiovascular complication will be avoided Outcome: Progressing   Problem: Activity: Goal: Risk for activity intolerance will decrease Outcome: Progressing   Problem: Nutrition: Goal: Adequate nutrition will be maintained Outcome: Progressing   Problem: Coping: Goal: Level of anxiety will decrease Outcome: Progressing   Problem: Elimination: Goal: Will not experience complications related to bowel motility Outcome: Progressing Goal: Will not experience complications related to urinary retention Outcome: Progressing   Problem: Pain Managment: Goal: General experience of comfort will improve and/or be controlled Outcome: Progressing   Problem:  Safety: Goal: Ability to remain free from injury will improve Outcome: Progressing   Problem: Skin Integrity: Goal: Risk for impaired skin integrity will decrease Outcome: Progressing   Problem: Education: Goal: Knowledge of disease or condition will improve Outcome: Progressing Goal: Knowledge of secondary prevention will improve (MUST DOCUMENT ALL) Outcome: Progressing Goal: Knowledge of patient specific risk factors will improve (DELETE if not current risk factor) Outcome: Progressing   "

## 2024-02-06 DIAGNOSIS — I639 Cerebral infarction, unspecified: Secondary | ICD-10-CM | POA: Diagnosis not present

## 2024-02-06 LAB — COMPREHENSIVE METABOLIC PANEL WITH GFR
ALT: 9 U/L (ref 0–44)
AST: 17 U/L (ref 15–41)
Albumin: 3.6 g/dL (ref 3.5–5.0)
Alkaline Phosphatase: 85 U/L (ref 38–126)
Anion gap: 11 (ref 5–15)
BUN: 37 mg/dL — ABNORMAL HIGH (ref 8–23)
CO2: 30 mmol/L (ref 22–32)
Calcium: 9.3 mg/dL (ref 8.9–10.3)
Chloride: 100 mmol/L (ref 98–111)
Creatinine, Ser: 1.22 mg/dL — ABNORMAL HIGH (ref 0.44–1.00)
GFR, Estimated: 49 mL/min — ABNORMAL LOW
Glucose, Bld: 145 mg/dL — ABNORMAL HIGH (ref 70–99)
Potassium: 3.1 mmol/L — ABNORMAL LOW (ref 3.5–5.1)
Sodium: 141 mmol/L (ref 135–145)
Total Bilirubin: 0.6 mg/dL (ref 0.0–1.2)
Total Protein: 7.5 g/dL (ref 6.5–8.1)

## 2024-02-06 LAB — CBC WITH DIFFERENTIAL/PLATELET
Abs Immature Granulocytes: 0.02 K/uL (ref 0.00–0.07)
Basophils Absolute: 0 K/uL (ref 0.0–0.1)
Basophils Relative: 0 %
Eosinophils Absolute: 0.1 K/uL (ref 0.0–0.5)
Eosinophils Relative: 1 %
HCT: 41.9 % (ref 36.0–46.0)
Hemoglobin: 13.7 g/dL (ref 12.0–15.0)
Immature Granulocytes: 0 %
Lymphocytes Relative: 45 %
Lymphs Abs: 3.6 K/uL (ref 0.7–4.0)
MCH: 22.6 pg — ABNORMAL LOW (ref 26.0–34.0)
MCHC: 32.7 g/dL (ref 30.0–36.0)
MCV: 69.1 fL — ABNORMAL LOW (ref 80.0–100.0)
Monocytes Absolute: 0.8 K/uL (ref 0.1–1.0)
Monocytes Relative: 9 %
Neutro Abs: 3.6 K/uL (ref 1.7–7.7)
Neutrophils Relative %: 45 %
Platelets: 261 K/uL (ref 150–400)
RBC: 6.06 MIL/uL — ABNORMAL HIGH (ref 3.87–5.11)
RDW: 17.6 % — ABNORMAL HIGH (ref 11.5–15.5)
Smear Review: NORMAL
WBC: 8 K/uL (ref 4.0–10.5)
nRBC: 0 % (ref 0.0–0.2)

## 2024-02-06 LAB — GLUCOSE, CAPILLARY
Glucose-Capillary: 157 mg/dL — ABNORMAL HIGH (ref 70–99)
Glucose-Capillary: 131 mg/dL — ABNORMAL HIGH (ref 70–99)
Glucose-Capillary: 129 mg/dL — ABNORMAL HIGH (ref 70–99)
Glucose-Capillary: 136 mg/dL — ABNORMAL HIGH (ref 70–99)

## 2024-02-06 LAB — VITAMIN D 25 HYDROXY (VIT D DEFICIENCY, FRACTURES): Vit D, 25-Hydroxy: 28.1 ng/mL — ABNORMAL LOW (ref 30–100)

## 2024-02-06 MED ORDER — POTASSIUM CHLORIDE CRYS ER 20 MEQ PO TBCR
40.0000 meq | EXTENDED_RELEASE_TABLET | Freq: Two times a day (BID) | ORAL | Status: AC
  Administered 2024-02-06 (×2): 40 meq via ORAL
  Filled 2024-02-06 (×2): qty 2

## 2024-02-06 MED ORDER — L-METHYLFOLATE-B6-B12 3-35-2 MG PO TABS
1.0000 | ORAL_TABLET | Freq: Every day | ORAL | Status: DC
  Administered 2024-02-06 – 2024-03-02 (×26): 1 via ORAL
  Filled 2024-02-06 (×26): qty 1

## 2024-02-06 MED ORDER — B COMPLEX-C PO TABS
1.0000 | ORAL_TABLET | Freq: Every day | ORAL | Status: DC
  Administered 2024-02-06 – 2024-03-02 (×26): 1 via ORAL
  Filled 2024-02-06 (×26): qty 1

## 2024-02-06 MED ORDER — VITAMIN D 25 MCG (1000 UNIT) PO TABS
1000.0000 [IU] | ORAL_TABLET | Freq: Every day | ORAL | Status: DC
  Administered 2024-02-06 – 2024-02-11 (×6): 1000 [IU] via ORAL
  Filled 2024-02-06 (×6): qty 1

## 2024-02-06 NOTE — Progress Notes (Signed)
 Inpatient Rehabilitation  Patient information reviewed and entered into eRehab system by [facility]. Karen Kays., CCC/SLP, PPS Coordinator.  Information including medical coding, functional ability and quality indicators will be reviewed and updated through discharge.

## 2024-02-06 NOTE — Progress Notes (Signed)
 Inpatient Rehabilitation Admission Medication Review by a Pharmacist  A complete drug regimen review was completed for this patient to identify any potential clinically significant medication issues.  High Risk Drug Classes Is patient taking? Indication by Medication  Antipsychotic Yes, as an intravenous medication Prochlorperazine  - prn N/V  Anticoagulant Yes Enoxaparin  - VTE prophylxis  Antibiotic No   Opioid No   Antiplatelet Yes ASA - CVA prophylaxis Clopidogrel  - CVA prophylaxis  Hypoglycemics/insulin  Yes Dapagliflozin  - DM Insulin  aspart - DM  Vasoactive Medication Yes Amlodipine  - HTN Chlorthalidone  - HTN Irbesartan  - HTN Nebivolol  - HTN  Chemotherapy No   Other Yes APAP - prn pain Maalox - prn indigestion Vitamin C  - supplement Vitamin B - supplement Vitamin D  - supplement Atorvastatin  - HLD Bisacodyl  - prn constipation Diphenhydramine  - prn itching Guaifenesin  DM - prn cough Metanx - supplement Miralax  - prn constipation SennaS - prn constipation Trazodone  - prn sleep Zinc  - supplement     Type of Medication Issue Identified Description of Issue Recommendation(s)  Drug Interaction(s) (clinically significant)     Duplicate Therapy     Allergy     No Medication Administration End Date     Incorrect Dose     Additional Drug Therapy Needed     Significant med changes from prior encounter (inform family/care partners about these prior to discharge). Atorvastatin  40mg  > 80mg  DOSE INCREASED during inpatient admission.    Other  PTA medications not restarted: Folic Acid  1mg  daily Linzess  145mg  daily as needed Metformin  1gm PO BID Resume as appropriate.    Clinically significant medication issues were identified that warrant physician communication and completion of prescribed/recommended actions by midnight of the next day:  No  Name of provider notified for urgent issues identified:   Provider Method of Notification:     Pharmacist comments:   Time  spent performing this drug regimen review (minutes):  20   Marisa [name], Pharm.D., BCPS Clinical Pharmacist Clinical phone for 02/06/2024 from 7:30-3:00 is [phone].  **Pharmacist phone directory can be found on [email or web address] listed under [facility].  02/06/2024 11:43 AM

## 2024-02-06 NOTE — Plan of Care (Signed)
 " Problem: RH Balance Goal: LTG: Patient will maintain dynamic sitting balance (OT) Description: LTG:  Patient will maintain dynamic sitting balance with assistance during activities of daily living (OT) Flowsheets (Taken 02/06/2024 0944) LTG: Pt will maintain dynamic sitting balance during ADLs with: Supervision/Verbal cueing Goal: LTG Patient will maintain dynamic standing with ADLs (OT) Description: LTG:  Patient will maintain dynamic standing balance with assist during activities of daily living (OT)  Flowsheets (Taken 02/06/2024 0944) LTG: Pt will maintain dynamic standing balance during ADLs with: Contact Guard/Touching assist   Problem: Sit to Stand Goal: LTG:  Patient will perform sit to stand in prep for activites of daily living with assistance level (OT) Description: LTG:  Patient will perform sit to stand in prep for activites of daily living with assistance level (OT) Flowsheets (Taken 02/06/2024 0944) LTG: PT will perform sit to stand in prep for activites of daily living with assistance level: Contact Guard/Touching assist   Problem: RH Eating Goal: LTG Patient will perform eating w/assist, cues/equip (OT) Description: LTG: Patient will perform eating with assist, with/without cues using equipment (OT) Flowsheets (Taken 02/06/2024 0944) LTG: Pt will perform eating with assistance level of: Independent with assistive device    Problem: RH Grooming Goal: LTG Patient will perform grooming w/assist,cues/equip (OT) Description: LTG: Patient will perform grooming with assist, with/without cues using equipment (OT) Flowsheets (Taken 02/06/2024 0944) LTG: Pt will perform grooming with assistance level of: Independent with assistive device    Problem: RH Bathing Goal: LTG Patient will bathe all body parts with assist levels (OT) Description: LTG: Patient will bathe all body parts with assist levels (OT) Flowsheets (Taken 02/06/2024 0944) LTG: Pt will perform bathing with assistance  level/cueing: Minimal Assistance - Patient > 75%   Problem: RH Dressing Goal: LTG Patient will perform upper body dressing (OT) Description: LTG Patient will perform upper body dressing with assist, with/without cues (OT). Flowsheets (Taken 02/06/2024 0944) LTG: Pt will perform upper body dressing with assistance level of: Supervision/Verbal cueing Goal: LTG Patient will perform lower body dressing w/assist (OT) Description: LTG: Patient will perform lower body dressing with assist, with/without cues in positioning using equipment (OT) Flowsheets (Taken 02/06/2024 0944) LTG: Pt will perform lower body dressing with assistance level of: Minimal Assistance - Patient > 75%   Problem: RH Toileting Goal: LTG Patient will perform toileting task (3/3 steps) with assistance level (OT) Description: LTG: Patient will perform toileting task (3/3 steps) with assistance level (OT)  Flowsheets (Taken 02/06/2024 0944) LTG: Pt will perform toileting task (3/3 steps) with assistance level: Minimal Assistance - Patient > 75%   Problem: RH Functional Use of Upper Extremity Goal: LTG Patient will use RT/LT upper extremity as a (OT) Description: LTG: Patient will use right/left upper extremity as a stabilizer/gross assist/diminished/nondominant/dominant level with assist, with/without cues during functional activity (OT) Flowsheets (Taken 02/06/2024 0944) LTG: Use of upper extremity in functional activities: RUE as a stabilizer LTG: Pt will use upper extremity in functional activity with assistance level of: Supervision/Verbal cueing   Problem: RH Toilet Transfers Goal: LTG Patient will perform toilet transfers w/assist (OT) Description: LTG: Patient will perform toilet transfers with assist, with/without cues using equipment (OT) Flowsheets (Taken 02/06/2024 0944) LTG: Pt will perform toilet transfers with assistance level of: Contact Guard/Touching assist   Problem: RH Tub/Shower Transfers Goal: LTG Patient will  perform tub/shower transfers w/assist (OT) Description: LTG: Patient will perform tub/shower transfers with assist, with/without cues using equipment (OT) Flowsheets (Taken 02/06/2024 0944) LTG: Pt will perform tub/shower  stall transfers with assistance level of: Minimal Assistance - Patient > 75%   "

## 2024-02-06 NOTE — Progress Notes (Signed)
 Inpatient Rehabilitation Center Individual Statement of Services  Patient Name:  GLENDORIS NODARSE  Date:  02/06/2024  Welcome to the Inpatient Rehabilitation [facility].  Our goal is to provide you with an individualized program based on your diagnosis and situation, designed to meet your specific needs.  With this comprehensive rehabilitation program, you will be expected to participate in at least 3 hours of rehabilitation therapies Monday-Friday, with modified therapy programming on the weekends.  Your rehabilitation program will include the following services:  Physical Therapy (PT), Occupational Therapy (OT), Speech Therapy (ST), 24 hour per day rehabilitation nursing, Therapeutic Recreaction (TR), Care Coordinator, Rehabilitation Medicine, Nutrition Services, and Pharmacy Services  Weekly team conferences will be held on Wednesday to discuss your progress.  Your Inpatient Rehabilitation Care Coordinator will talk with you frequently to get your input and to update you on team discussions.  Team conferences with you and your family in attendance may also be held.  Expected length of stay: 12-14 days   Overall anticipated outcome: supervision to min assist goals   Depending on your progress and recovery, your program may change. Your Inpatient Rehabilitation Care Coordinator will coordinate services and will keep you informed of any changes. Your Inpatient Rehabilitation Care Coordinator's name and contact numbers are listed  below.  The following services may also be recommended but are not provided by the Inpatient Rehabilitation Center:  Driving Evaluations Home Health Rehabiltiation Services Outpatient Rehabilitation Services Vocational Rehabilitation   Arrangements will be made to provide these services after discharge if needed.  Arrangements include referral to agencies that provide these services.  Your insurance has been verified to be:  [employer] / [employer]  Your primary doctor is:   Jarold Medici, MD   Pertinent information will be shared with your doctor and your insurance company.  Inpatient Rehabilitation Care Coordinator:  [name] Loreli SIERRAS [phone] or ELIGAH BRINKS  Information discussed with and copy given to patient by: Waverly Loreli, 02/06/2024, 7:27 AM

## 2024-02-06 NOTE — IPOC Note (Signed)
 Overall Plan of Care [facility]) Patient Details Name: Marisa [name] MRN: [id number] DOB: August 20, 1960  Admitting Diagnosis: Left basal ganglia embolic stroke [facility])  Hospital Problems: Principal Problem:   Left basal ganglia embolic stroke [facility]) Active Problems:   CVA (cerebral vascular accident) [facility])     Functional Problem List: Nursing Bladder, Pain, Bowel, Safety, Endurance, Medication Management  PT Balance, Edema, Endurance, Motor, Skin Integrity, Safety  OT Balance, Cognition, Edema, Endurance, Motor  SLP Cognition, Motor  TR         Basic ADLs: OT Eating, Grooming, Bathing, Dressing, Toileting     Advanced  ADLs: OT None     Transfers: PT Bed Mobility, Bed to Chair, Car, Occupational Psychologist, Research Scientist (life Sciences): PT Ambulation, Psychologist, [employer] Services, Stairs     Additional Impairments: OT Fuctional Use of Upper Extremity  SLP Social Cognition, Swallowing   Problem Solving, Memory, Attention, Awareness  TR      Anticipated Outcomes Item Anticipated Outcome  Self Feeding Mod I  Swallowing  modI   Basic self-care  Min A  Toileting  Min A   Bathroom Transfers CGA  Bowel/Bladder  manage bowel w mod I and bladder w toileting  Transfers  supervision with LRAD  Locomotion  CGA with LRAD  Communication     Cognition  supervision  Pain     Safety/Judgment  manage safety w cues   Therapy Plan: PT Intensity: Minimum of 1-2 x/day ,45 to 90 minutes PT Frequency: 5 out of 7 days PT Duration Estimated Length of Stay: 14-18 days OT Intensity: Minimum of 1-2 x/day, 45 to 90 minutes OT Frequency: 5 out of 7 days OT Duration/Estimated Length of Stay: 2-3 weeks SLP Intensity: Minumum of 1-2 x/day, 30 to 90 minutes SLP Frequency: 3 to 5 out of 7 days SLP Duration/Estimated Length of Stay: 2-3 weeks   Team Interventions: Nursing Interventions Patient/Family Education, Disease Management/Prevention, Bowel Management, Bladder Management, Medication  Management, Discharge Planning  PT interventions Ambulation/gait training, Discharge planning, Functional mobility training, Psychosocial support, Therapeutic Activities, Balance/vestibular training, Disease management/prevention, Neuromuscular re-education, Skin care/wound management, Therapeutic Exercise, Wheelchair propulsion/positioning, Cognitive remediation/compensation, DME/adaptive equipment instruction, Pain management, Splinting/orthotics, UE/LE Strength taining/ROM, Community reintegration, Development worker, international aid stimulation, Patient/family education, Museum/gallery curator, UE/LE Coordination activities  OT Interventions Warden/ranger, Discharge planning, Functional electrical stimulation, Pain management, Self Care/advanced ADL retraining, Therapeutic Activities, UE/LE Coordination activities, Cognitive remediation/compensation, Disease mangement/prevention, Functional mobility training, Patient/family education, Skin care/wound managment, Therapeutic Exercise, DME/adaptive equipment instruction, Neuromuscular re-education, Splinting/orthotics, UE/LE Strength taining/ROM, Wheelchair propulsion/positioning  SLP Interventions Cognitive remediation/compensation, Environmental controls, Cueing hierarchy, Functional tasks, Internal/external aids, Dysphagia/aspiration precaution training, Patient/family education, Therapeutic Activities  TR Interventions    SW/CM Interventions Discharge Planning, Psychosocial Support, Patient/Family Education, Disease Management/Prevention   Barriers to Discharge MD  Medical stability  Nursing Decreased caregiver support, Home environment access/layout 1 level 2 ste right rail w spouse  PT Home environment access/layout, Weight, Other (comments) R hemi, 2 STE, weight, decreased balance/coordination  OT Inaccessible home environment, Home environment access/layout, Incontinence    SLP      SW       Team Discharge Planning: Destination: PT-Home ,OT-  Home , SLP-Home Projected Follow-up: PT-Outpatient PT, OT-  Outpatient OT, SLP-Outpatient SLP Projected Equipment Needs: PT-To be determined, OT- To be determined, SLP-None recommended by SLP Equipment Details: PT- , OT-  Patient/family involved in discharge planning: PT- Patient, Family member/caregiver,  OT-Patient, SLP-Patient  MD ELOS: 14-18 days Medical Rehab Prognosis:  Excellent Assessment:  The patient has been admitted for CIR therapies with the diagnosis of left basal ganglia infarct. The team will be addressing functional mobility, strength, stamina, balance, safety, adaptive techniques and equipment, self-care, bowel and bladder mgt, patient and caregiver education. Goals have been set at [facility]I to [facility]. Anticipated discharge destination is home.        See Team Conference Notes for weekly updates to the plan of care

## 2024-02-06 NOTE — Plan of Care (Signed)
" °  Problem: RH Swallowing Goal: LTG Patient will consume least restrictive diet using compensatory strategies with assistance (SLP) Description: LTG:  Patient will consume least restrictive diet using compensatory strategies with assistance (SLP) Flowsheets (Taken 02/06/2024 1144) LTG: Pt Patient will consume least restrictive diet using compensatory strategies with assistance of (SLP): Modified Independent   Problem: RH Expression Communication Goal: LTG Patient will increase speech intelligibility (SLP) Description: LTG: Patient will increase speech intelligibility at word/phrase/conversation level with cues, % of the time (SLP) Flowsheets (Taken 02/06/2024 1144) LTG: Patient will increase speech intelligibility (SLP): Supervision Level: Conversation level Percent of time patient will use intelligible speech: 80   Problem: RH Problem Solving Goal: LTG Patient will demonstrate problem solving for (SLP) Description: LTG:  Patient will demonstrate problem solving for basic/complex daily situations with cues  (SLP) Flowsheets (Taken 02/06/2024 1144) LTG: Patient will demonstrate problem solving for (SLP): (mildly complex) Other (comment) LTG Patient will demonstrate problem solving for: Supervision   Problem: RH Memory Goal: LTG Patient will demonstrate ability for day to day (SLP) Description: LTG:   Patient will demonstrate ability for day to day recall/carryover during cognitive/linguistic activities with assist  (SLP) Flowsheets (Taken 02/06/2024 1144) LTG: Patient will demonstrate ability for day to day recall: New information LTG: Patient will demonstrate ability for day to day recall/carryover during cognitive/linguistic activities with assist (SLP): Supervision   Problem: RH Attention Goal: LTG Patient will demonstrate this level of attention during functional activites (SLP) Description: LTG:  Patient will will demonstrate this level of attention during functional activites  (SLP) Flowsheets (Taken 02/06/2024 1144) Patient will demonstrate during cognitive/linguistic activities the attention type of: Sustained Patient will demonstrate this level of attention during cognitive/linguistic activities in: Controlled LTG: Patient will demonstrate this level of attention during cognitive/linguistic activities with assistance of (SLP): Supervision Number of minutes patient will demonstrate attention during cognitive/linguistic activities: 30   "

## 2024-02-06 NOTE — Progress Notes (Signed)
 Patient ID: Marisa [name], female   DOB: December 23, 1960, 64 y.o.   MRN: [id number] Met with the patient and family to review current medical situation, rehab process, team conference and plan of care. Discussed secondary risk management including HTN  (on medication), HLD (LDL 145/Trig 121), DM (A1C 7.2) on Metformin  and Farxiga  PTA with SVD, OSA (CPAP) and Obesity. Reviewed medications and dietary modification for HH/CMM diet and DAPT x 3 weeks then Plavix  solo and reviewed dietary supplements. Continue to follow along to address educational needs to facilitate preparation for discharge. Marisa [name]

## 2024-02-06 NOTE — Evaluation (Signed)
 Occupational Therapy Assessment and Plan  Patient Details  Name: Marisa [name] MRN: [id number] Date of Birth: 02-11-1960  OT Diagnosis: flaccid hemiplegia and hemiparesis, muscle weakness (generalized), and swelling of limb Rehab Potential: Rehab Potential (ACUTE ONLY): Good ELOS: 2-3 weeks   Today's Date: 02/06/2024 OT Individual Time: 9194-9084 OT Individual Time Calculation (min): 70 min     Hospital Problem: Principal Problem:   Left basal ganglia embolic stroke (HCC) Active Problems:   CVA (cerebral vascular accident) (HCC)   Past Medical History:  Past Medical History:  Diagnosis Date   Borderline diabetes    Hypertension    Past Surgical History:  Past Surgical History:  Procedure Laterality Date   ABDOMINAL HYSTERECTOMY     CESAREAN SECTION     SHOULDER SURGERY     TRANSESOPHAGEAL ECHOCARDIOGRAM (CATH LAB) N/A 02/02/2024   Procedure: TRANSESOPHAGEAL ECHOCARDIOGRAM;  Surgeon: Santo Stanly LABOR, MD;  Location: [facility];  Service: Cardiovascular;  Laterality: N/A;    Assessment & Plan Clinical Impression:  Marisa [name] is a 64 year old female with PMHx HTN, DM2, sleep apnea, morbid obesity, and HLD presented to [facility] ED on 01/28/24 with concerns of left upper and lower extremity weakness with difficulty ambulating. Patient reported that symptoms began around 0300. Upon arrival Code stroke was activated and patient was emergently evaluated by teleneurology.  CT was negative.  She was outside the window for TNK. Labs in ED Na 140, potassium 3.1, chloride 100, bicarb 23, BUN 12, serum creatinine 0.79, eGFR greater than 60, glucose 132, WBC 8.6, hemoglobin 13, platelets 273. Neurology consulted and clopidogrel  bolus initiated along with aspirin . Patient was transferred to [facility] for ongoing management. The patient underwent MRI showed large 2.5 cm left subcortical infarct. CT angiogram showed no LVO. Echo EF 65 to 70%, TCD bubble  study negative. Right hemiplegia felt due to large left subcortical infarct likely cryptogenic etiology.  Patient was placed on Aspirin  and Plavix  for stroke prevention for 3 weeks followed by Plavix  alone. An incidental finding of a 2.2 cm left thyroid nodule found on CTA head/neck was deemed to be stable and should be followed up on in outpatient setting.    Prior to arrival the patient was independent and driving, she does not work. She lives in a one level home with 2 steps to enter with spouse and requires max assist for functional mobility during ADLs and total assist for repositioning and uses stedy for transfers to chair. Therapy evaluations completed due to patient decreased functional mobility was admitted for a comprehensive rehab program. Patient transferred to CIR on 02/05/2024 .    Patient currently requires max with basic self-care skills secondary to muscle weakness, decreased cardiorespiratoy endurance, decreased coordination and decreased motor planning, decreased problem solving and decreased memory, and decreased sitting balance, decreased standing balance, and hemiplegia.  Prior to hospitalization, patient could complete self-care independently.  Patient will benefit from skilled intervention to decrease level of assist with basic self-care skills and increase independence with basic self-care skills prior to discharge home with care partner.  Anticipate patient will require minimal physical assistance and follow up outpatient.  OT - End of Session Activity Tolerance: Tolerates 10 - 20 min activity with multiple rests Endurance Deficit: Yes Endurance Deficit Description: rest breaks needed throughout OT Assessment Rehab Potential (ACUTE ONLY): Good OT Barriers to Discharge: Inaccessible home environment;Home environment access/layout;Incontinence OT Patient demonstrates impairments in the following area(s): Balance;Cognition;Edema;Endurance;Motor OT Basic ADL's Functional  Problem(s): Eating;Grooming;Bathing;Dressing;Toileting  OT Advanced ADL's Functional Problem(s): None OT Transfers Functional Problem(s): Toilet;Tub/Shower OT Additional Impairment(s): Fuctional Use of Upper Extremity OT Plan OT Intensity: Minimum of 1-2 x/day, 45 to 90 minutes OT Frequency: 5 out of 7 days OT Duration/Estimated Length of Stay: 2-3 weeks OT Treatment/Interventions: Balance/vestibular training;Discharge planning;Functional electrical stimulation;Pain management;Self Care/advanced ADL retraining;Therapeutic Activities;UE/LE Coordination activities;Cognitive remediation/compensation;Disease mangement/prevention;Functional mobility training;Patient/family education;Skin care/wound managment;Therapeutic Exercise;DME/adaptive equipment instruction;Neuromuscular re-education;Splinting/orthotics;UE/LE Strength taining/ROM;Wheelchair propulsion/positioning OT Self Feeding Anticipated Outcome(s): Mod I OT Basic Self-Care Anticipated Outcome(s): Min A OT Toileting Anticipated Outcome(s): Min A OT Bathroom Transfers Anticipated Outcome(s): CGA OT Recommendation Recommendations for Other Services: Neuropsych consult;Therapeutic Recreation consult Therapeutic Recreation Interventions: Pet therapy Patient destination: Home Follow Up Recommendations: Outpatient OT Equipment Recommended: To be determined   OT Evaluation Precautions/Restrictions  Precautions Precautions: Fall Precaution/Restrictions Comments: R dense hemi Restrictions Weight Bearing Restrictions Per Provider Order: No Home Living/Prior Functioning Home Living Family/patient expects to be discharged to:: Private residence Living Arrangements: Spouse/significant other Available Help at Discharge: Family, Available 24 hours/day (3 adult kids can assist) Type of Home: House Home Access: Stairs to enter [employer] of Steps: 2 Entrance Stairs-Rails: Right Home Layout: One level, Laundry or work area in  basement [employer] Shower/Tub: [employer], Door, Tub only Firefighter: Standard Bathroom Accessibility: Yes Additional Comments: No DME available, questionable bathroom accessibility  Lives With: Spouse IADL History Homemaking Responsibilities: Yes Meal Prep Responsibility: Primary Current License: Yes Occupation: Retired Type of Occupation: Retired from [employer] Leisure and Hobbies: Flower gardening Prior Function Level of Independence: Independent with basic ADLs, Independent with homemaking with ambulation, Independent with gait, Independent with transfers  Able to Take Stairs?: Yes Driving: Yes Vision Baseline Vision/History: 0 No visual deficits Ability to See in Adequate Light: 1 Impaired Patient Visual Report: No change from baseline Vision Assessment?: No apparent visual deficits Perception  Perception: Within Functional Limits Praxis Praxis: WFL Cognition Cognition Overall Cognitive Status: Difficult to assess Arousal/Alertness: Awake/alert Orientation Level: Person;Place;Situation Person: Oriented Place: Oriented Situation: Oriented Memory: Impaired Awareness: Appears intact Problem Solving: Impaired Safety/Judgment: Appears intact Brief Interview for Mental Status (BIMS) Repetition of Three Words (First Attempt): 3 Temporal Orientation: Year: Missed by more than 5 years Temporal Orientation: Month: Accurate within 5 days Temporal Orientation: Day: Correct Recall: Sock: Yes, no cue required Recall: Blue: Yes, no cue required Recall: Bed: Yes, after cueing (a piece of furniture) BIMS Summary Score: 11 Sensation Sensation Light Touch: Appears Intact Hot/Cold: Not tested Proprioception: Impaired by gross assessment Stereognosis: Not tested Coordination Gross Motor Movements are Fluid and Coordinated: No Fine Motor Movements are Fluid and Coordinated: No Finger Nose Finger Test: Impaired RUE Motor  Motor Motor: Hemiplegia Motor -  Skilled Clinical Observations: R dense hemiplegia  Trunk/Postural Assessment  Cervical Assessment Cervical Assessment: Exceptions to [facility] (forward head) Thoracic Assessment Thoracic Assessment: Exceptions to [facility] (rounded head) Lumbar Assessment Lumbar Assessment: Exceptions to [facility] (posterior pelvic) Postural Control Postural Control: Deficits on evaluation Righting Reactions: delayed R Protective Responses: delayed R  Balance Balance Balance Assessed: Yes Static Sitting Balance Static Sitting - Balance Support: Feet supported;No upper extremity supported Static Sitting - Level of Assistance: 5: Stand by assistance (supervision) Dynamic Sitting Balance Dynamic Sitting - Balance Support: Feet supported;No upper extremity supported Dynamic Sitting - Level of Assistance: 4: Min Oncologist Standing - Balance Support: During functional activity;Bilateral upper extremity supported Static Standing - Level of Assistance: Other (comment) (dependent in stedy) Dynamic Standing Balance Dynamic Standing - Balance Support: During functional activity;Bilateral upper  extremity supported Dynamic Standing - Level of Assistance: Other (comment) (dependent in stedy) Extremity/Trunk Assessment RUE Assessment RUE Assessment: Exceptions to [facility] RUE Body System: Neuro Brunstrum levels for arm and hand: Arm;Hand Brunstrum level for arm: Stage I Presynergy Brunstrum level for hand: Stage I Flaccidity LUE Assessment LUE Assessment: Within Functional Limits  Care Tool Care Tool Self Care Eating   Eating Assist Level: Set up assist    Oral Care    Oral Care Assist Level: Supervision/Verbal cueing    Bathing   Body parts bathed by patient: Right arm;Chest;Abdomen;Front perineal area;Buttocks;Left upper leg;Left lower leg;Face Body parts bathed by helper: Left arm;Right lower leg;Right upper leg   Assist Level: Moderate Assistance - Patient 50 - 74%    Upper Body  Dressing(including orthotics)   What is the patient wearing?: Pull over shirt   Assist Level: Maximal Assistance - Patient 25 - 49%    Lower Body Dressing (excluding footwear)   What is the patient wearing?: Incontinence brief;Pants Assist for lower body dressing: Total Assistance - Patient < 25%    Putting on/Taking off footwear   What is the patient wearing?: Non-skid slipper socks Assist for footwear: Dependent - Patient 0%       Care Tool Toileting Toileting activity   Assist for toileting: Maximal Assistance - Patient 25 - 49%     Care Tool Bed Mobility Roll left and right activity   Roll left and right assist level: Total Assistance - Patient < 25%    Sit to lying activity   Sit to lying assist level: Total Assistance - Patient < 25%    Lying to sitting on side of bed activity   Lying to sitting on side of bed assist level: the ability to move from lying on the back to sitting on the side of the bed with no back support.: Maximal Assistance - Patient 25 - 49%     Care Tool Transfers Sit to stand transfer   Sit to stand assist level: Dependent - Patient 0%    Chair/bed transfer   Chair/bed transfer assist level: Dependent - Patient 0% (stedy)     Toilet transfer   Assist Level: Dependent - Patient 0% (stedy)     Care Tool Cognition  Expression of Ideas and Wants Expression of Ideas and Wants: 4. Without difficulty (complex and basic) - expresses complex messages without difficulty and with speech that is clear and easy to understand  Understanding Verbal and Non-Verbal Content Understanding Verbal and Non-Verbal Content: 4. Understands (complex and basic) - clear comprehension without cues or repetitions   Memory/Recall Ability Memory/Recall Ability : Current season;Location of own room;That he or she is in a hospital/hospital unit   Refer to Care Plan for Long Term Goals  SHORT TERM GOAL WEEK 1 OT Short Term Goal 1 (Week 1): Pt will complete toilet transfer  with mod A OT Short Term Goal 2 (Week 1): Pt will complete LB dressing with mod A OT Short Term Goal 3 (Week 1): Pt will complete sit > stand with min A using LRAD  Recommendations for other services: Neuropsych and Therapeutic Recreation  Pet therapy   Skilled Therapeutic Intervention Patient received upright in bed upon therapy arrival and agreeable to participate in OT evaluation. Education provided on OT purpose, therapy schedule, goals for therapy, and safety policy while in rehab. No pain reported.  Completed bed mobility with max A for rolling > L and supine to sit, with toileting for incontinent urinary void and  LB bathing/dressing completed at bed level for efficiency requiring max A. Utilized stedy for dependent transfers, however pt only required CGA/min A to stand in stedy and with good static sitting balance requiring supervision during transfer. Cues needed throughout for body positioning and sequencing.  Patient demonstrates dense R hemiplegia (both RUE and RLE), dynamic sitting/standing balance, functional endurance, cognitive (memory and problem solving) deficits resulting in difficulty completing BADL tasks without increased physical assist. See below for further ADL and functional transfer performance. Pt will benefit from skilled OT services to focus on mentioned deficits. OT retrieved 20x20 MWC and would benefit from RUE 1/2 lap tray- PT informed. Pt remained seated in w/c at conclusion of session with belt alarm on and all needs met at end of session.      ADL ADL Eating: Set up Where Assessed-Eating: Wheelchair Grooming: Supervision/safety Where Assessed-Grooming: Sitting at sink Upper Body Bathing: Moderate assistance Where Assessed-Upper Body Bathing: Edge of bed Lower Body Bathing: Moderate assistance Where Assessed-Lower Body Bathing: Bed level Upper Body Dressing: Maximal assistance Where Assessed-Upper Body Dressing: Edge of bed Lower Body Dressing: Maximal  assistance Where Assessed-Lower Body Dressing: Bed level Toileting: Maximal assistance Where Assessed-Toileting: Bed level Toilet Transfer: Dependent Toilet Transfer Method: Other (comment) (stedy) Toilet Transfer Equipment: Bedside commode Tub/Shower Transfer: Unable to assess Tub/Shower Transfer Method: Unable to assess Film/video Editor: Unable to assess Film/video Editor Method: Unable to assess Mobility  Bed Mobility Bed Mobility: Rolling Right;Rolling Left;Right Sidelying to Sit Rolling Right: Supervision/verbal cueing Rolling Left: Total Assistance - Patient < 25% Right Sidelying to Sit: Maximal Assistance - Patient 25-49% Transfers Sit to Stand: Dependent - Patient 0%   Discharge Criteria: Patient will be discharged from OT if patient refuses treatment 3 consecutive times without medical reason, if treatment goals not met, if there is a change in medical status, if patient makes no progress towards goals or if patient is discharged from hospital.  The above assessment, treatment plan, treatment alternatives and goals were discussed and mutually agreed upon: by patient  [name], MS, OTR/L  02/06/2024, 9:43 AM

## 2024-02-06 NOTE — Progress Notes (Signed)
 " Inpatient Rehabilitation Care Coordinator Assessment and Plan Patient Details  Name: Marisa [name] MRN: [id number] Date of Birth: [birth year]  Today's Date: 02/06/2024  Hospital Problems: Principal Problem:   Left basal ganglia embolic stroke [facility]) Active Problems:   CVA (cerebral vascular accident) [facility])  Past Medical History:  Past Medical History:  Diagnosis Date   Borderline diabetes    Hypertension    Past Surgical History:  Past Surgical History:  Procedure Laterality Date   ABDOMINAL HYSTERECTOMY     CESAREAN SECTION     SHOULDER SURGERY     TRANSESOPHAGEAL ECHOCARDIOGRAM (CATH LAB) N/A 02/02/2024   Procedure: TRANSESOPHAGEAL ECHOCARDIOGRAM;  Surgeon: Santo Stanly LABOR, MD;  Location: [facility] INVASIVE CV LAB;  Service: Cardiovascular;  Laterality: N/A;   Social History:  reports that she has never smoked. She has never used smokeless tobacco. She reports that she does not drink alcohol and does not use drugs.  Family / Support Systems Marital Status: Married How Long?: 40+ years Patient Roles: Spouse, Parent, Other (Comment) Spouse/Significant Other: Geologist, Engineering Sr Children: 2 daughters and a son Marisa [name] Other Supports: Grandchildren Anticipated Caregiver: Husband Ability/Limitations of Caregiver: None mentioned Caregiver Availability: 24/7 Family Dynamics: Great family support  Social History Preferred language: English Religion: Tefl Teacher Witness Education: Charity Fundraiser - How often do you need to have someone help you when you read instructions, pamphlets, or other written material from your doctor or pharmacy?: Never Writes: Yes Employment Status: Retired Age Retired: 59 Marine Scientist Issues: N/a Guardian/Conservator: N/a   Abuse/Neglect Abuse/Neglect Assessment Can Be Completed: Yes Physical Abuse: Denies Verbal Abuse: Denies Sexual Abuse: Denies Exploitation of patient/patient's resources: Denies Self-Neglect:  Denies  Patient response to: Social Isolation - How often do you feel lonely or isolated from those around you?: Never  Emotional Status Pt's affect, behavior and adjustment status: Adjusting to therapy Recent Psychosocial Issues: None Psychiatric History: None Substance Abuse History: None  Patient / Family Perceptions, Expectations & Goals Pt/Family understanding of illness & functional limitations: Patient/family understanding of illness & functional limtiatons Premorbid pt/family roles/activities: Active in the community Anticipated changes in roles/activities/participation: None Pt/family expectations/goals: Research Officer, Trade Union Agencies: None Premorbid Home Care/DME Agencies: None Transportation available at discharge: Yes, any family member will be able to provide transportation Is the patient able to respond to transportation needs?: Yes In the past 12 months, has lack of transportation kept you from medical appointments or from getting medications?: No In the past 12 months, has lack of transportation kept you from meetings, work, or from getting things needed for daily living?: No Resource referrals recommended: Other (Comment) (N/a)  Discharge Planning Living Arrangements: Spouse/significant other Support Systems: Spouse/significant other, Children, Other relatives (Grandchildren) Type of Residence: Private residence Insurance Resources: Media Planner (specify) (CHAMPVA [place]) Financial Resources: Restaurant Manager, Fast Food Screen Referred: Yes Living Expenses: Own Money Management: Patient Does the patient have any problems obtaining your medications?: No Home Management: Patient manages own home Patient/Family Preliminary Plans: Plans to return home withher spouse and the full support of her children and grandchildren Care Coordinator Anticipated Follow Up Needs: HH/OP Expected length of stay: 12-14 days  Clinical  Impression CSW met with patient/family to introduce herself and complete initial assessment. Patient presented to [facility] following a Left basal ganglia embolic stroke. Patient is able to make needs known. Her son Marisa [name] is present during the assessment. 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 She will be discharged  home with her spouse and support from her children and grandchildren. Any of her family members can provide transportation home. She is aware of her possible co-pay for DME if necessary.   There were no further needs or concerns at present. CSW will follow up with family and continue to follow. Will provide patient/family with an update as soon as one becomes available.    Marisa  [name] 02/06/2024, 8:48 AM    "

## 2024-02-06 NOTE — Evaluation (Signed)
 Physical Therapy Assessment and Plan  Patient Details  Name: Marisa [name] MRN: [id number] Date of Birth: 1960-07-06  PT Diagnosis: Abnormal posture, Abnormality of gait, Cognitive deficits, Coordination disorder, Difficulty walking, Edema, Hemiparesis dominant, Impaired cognition, and Muscle weakness Rehab Potential: Good ELOS: 14-18 days   Today's Date: 02/06/2024 PT Individual Time: 8752-8640 PT Individual Time Calculation (min): 72 min    Hospital Problem: Principal Problem:   Left basal ganglia embolic stroke (HCC) Active Problems:   CVA (cerebral vascular accident) (HCC)   Past Medical History:  Past Medical History:  Diagnosis Date   Borderline diabetes    Hypertension    Past Surgical History:  Past Surgical History:  Procedure Laterality Date   ABDOMINAL HYSTERECTOMY     CESAREAN SECTION     SHOULDER SURGERY     TRANSESOPHAGEAL ECHOCARDIOGRAM (CATH LAB) N/A 02/02/2024   Procedure: TRANSESOPHAGEAL ECHOCARDIOGRAM;  Surgeon: Santo Stanly LABOR, MD;  Location: [facility] INVASIVE CV LAB;  Service: Cardiovascular;  Laterality: N/A;    Assessment & Plan Clinical Impression: Patient is a 64 y.o. year old female with PMHx HTN, DM2, sleep apnea, morbid obesity, and HLD presented to [facility] ED on 01/28/24 with concerns of left upper and lower extremity weakness with difficulty ambulating. Patient reported that symptoms began around 0300. Upon arrival Code stroke was activated and patient was emergently evaluated by teleneurology.  CT was negative.  She was outside the window for TNK. Labs in ED Na 140, potassium 3.1, chloride 100, bicarb 23, BUN 12, serum creatinine 0.79, eGFR greater than 60, glucose 132, WBC 8.6, hemoglobin 13, platelets 273. Neurology consulted and clopidogrel  bolus initiated along with aspirin . Patient was transferred to [facility] for ongoing management. The patient underwent MRI showed large 2.5 cm left subcortical infarct. CT angiogram showed  no LVO. Echo EF 65 to 70%, TCD bubble study negative. Right hemiplegia felt due to large left subcortical infarct likely cryptogenic etiology.  Patient was placed on Aspirin  and Plavix  for stroke prevention for 3 weeks followed by Plavix  alone. An incidental finding of a 2.2 cm left thyroid nodule found on CTA head/neck was deemed to be stable and should be followed up on in outpatient setting.    Prior to arrival the patient was independent and driving, she does not work. She lives in a one level home with 2 steps to enter with spouse and requires max assist for functional mobility during ADLs and total assist for repositioning and uses stedy for transfers to chair. Therapy evaluations completed due to patient decreased functional mobility was admitted for a comprehensive rehab program.  Patient currently requires mod A (+2) with mobility secondary to muscle weakness, decreased cardiorespiratoy endurance, impaired timing and sequencing, abnormal tone, unbalanced muscle activation, decreased coordination, and decreased motor planning, decreased motor planning, and decreased sitting balance, decreased standing balance, decreased postural control, hemiplegia, and decreased balance strategies.  Prior to hospitalization, patient was independent  with mobility and lived with Spouse in a House home.  Home access is 2Stairs to enter.  Patient will benefit from skilled PT intervention to maximize safe functional mobility, minimize fall risk, and decrease caregiver burden for planned discharge home with 24 hour supervision.  Anticipate patient will benefit from follow up OP at discharge.  PT - End of Session Activity Tolerance: Tolerates 30+ min activity with multiple rests Endurance Deficit: Yes Endurance Deficit Description: required rest breaks due fatigue in RLE PT Assessment Rehab Potential (ACUTE/IP ONLY): Good PT Barriers to Discharge: Home environment access/layout;Weight;Other (  comments) PT Barriers  to Discharge Comments: R hemi, 2 STE, weight, decreased balance/coordination PT Patient demonstrates impairments in the following area(s): Balance;Edema;Endurance;Motor;Skin Integrity;Safety PT Transfers Functional Problem(s): Bed Mobility;Bed to Chair;Car;Furniture PT Locomotion Functional Problem(s): Ambulation;Wheelchair Mobility;Stairs PT Plan PT Intensity: Minimum of 1-2 x/day ,45 to 90 minutes PT Frequency: 5 out of 7 days PT Duration Estimated Length of Stay: 14-18 days PT Treatment/Interventions: Ambulation/gait training;Discharge planning;Functional mobility training;Psychosocial support;Therapeutic Activities;Balance/vestibular training;Disease management/prevention;Neuromuscular re-education;Skin care/wound management;Therapeutic Exercise;Wheelchair propulsion/positioning;Cognitive remediation/compensation;DME/adaptive equipment instruction;Pain management;Splinting/orthotics;UE/LE Strength taining/ROM;Community reintegration;Functional electrical stimulation;Patient/family education;Stair training;UE/LE Coordination activities PT Transfers Anticipated Outcome(s): supervision with LRAD PT Locomotion Anticipated Outcome(s): CGA with LRAD PT Recommendation Recommendations for Other Services: Neuropsych consult;Therapeutic Recreation consult Therapeutic Recreation Interventions: Pet therapy;Stress management;Outing/community reintergration Follow Up Recommendations: Outpatient PT Patient destination: Home Equipment Recommended: To be determined   PT Evaluation Precautions/Restrictions Precautions Precautions: Fall Recall of Precautions/Restrictions: Intact Precaution/Restrictions Comments: R dense hemi Restrictions Weight Bearing Restrictions Per Provider Order: No Pain Interference Pain Interference Pain Effect on Sleep: 1. Rarely or not at all Pain Interference with Therapy Activities: 1. Rarely or not at all Pain Interference with Day-to-Day Activities: 1. Rarely or not at  all Home Living/Prior Functioning Home Living Available Help at Discharge: Family;Available 24 hours/day Type of Home: House Home Access: Stairs to enter [employer] of Steps: 2 Entrance Stairs-Rails: Right Home Layout: One level;Laundry or work area in basement Sungard: Walk-in shower;Door;Tub only Firefighter: Pharmacist, Community: Yes Additional Comments: No DME available, questionable bathroom accessibility  Lives With: Spouse Prior Function Level of Independence: Independent with basic ADLs;Independent with homemaking with ambulation;Independent with gait;Independent with transfers  Able to Take Stairs?: Yes Driving: Yes 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, political party Vision/Perception  Vision - History Ability to See in Adequate Light: 1 Impaired Perception Perception: Within Financial Controller Praxis: WFL  Cognition Overall Cognitive Status: Impaired/Different from baseline Arousal/Alertness: Awake/alert Orientation Level: Oriented X4 Year: 2026 Month: January Day of Week: Correct Attention: Sustained Focused Attention: Appears intact Sustained Attention: Impaired Sustained Attention Impairment: Verbal complex;Functional complex Memory: Impaired Memory Impairment: Decreased short term memory Decreased Short Term Memory: Verbal basic;Functional basic Awareness: Impaired Awareness Impairment: Intellectual impairment;Emergent impairment;Anticipatory impairment Problem Solving: Impaired Problem Solving Impairment: Verbal complex;Functional complex Executive Function: Sequencing;Organizing Sequencing: Impaired Sequencing Impairment: Verbal complex;Functional complex Organizing: Impaired Organizing Impairment: Verbal complex;Functional complex Safety/Judgment: Appears intact Sensation Sensation Light Touch: Appears Intact Hot/Cold: Not tested Proprioception: Impaired by gross  assessment Stereognosis: Not tested Coordination Gross Motor Movements are Fluid and Coordinated: No Fine Motor Movements are Fluid and Coordinated: No Coordination and Movement Description: grossly uncoordinated due to dense R hemi, decreased balance/coordination, and weakness/deconditioning Finger Nose Finger Test: Impaired RUE Heel Shin Test: unable to perform with RLE Motor  Motor Motor: Hemiplegia Motor - Skilled Clinical Observations: R dense hemiplegia  Trunk/Postural Assessment  Cervical Assessment Cervical Assessment: Exceptions to [facility] (forward head) Thoracic Assessment Thoracic Assessment: Exceptions to [facility] (rounded shoulders) Lumbar Assessment Lumbar Assessment: Exceptions to [facility] (posterior pelvic tilt) Postural Control Postural Control: Deficits on evaluation Righting Reactions: delayed R Protective Responses: delayed R  Balance Balance Balance Assessed: Yes Static Sitting Balance Static Sitting - Balance Support: Feet supported;No upper extremity supported Static Sitting - Level of Assistance: 5: Stand by assistance (supervision) Dynamic Sitting Balance Dynamic Sitting - Balance Support: Feet supported;No upper extremity supported Dynamic Sitting - Level of Assistance: 4: Min Oncologist Standing - Balance Support: Left upper extremity supported;During functional activity Static Standing - Level of Assistance:  3: Mod assist Dynamic Standing Balance Dynamic Standing - Balance Support: Left upper extremity supported Dynamic Standing - Level of Assistance: 1: +2 Total assist Dynamic Standing - Comments: with gait Extremity Assessment  RLE Assessment RLE Assessment: Exceptions to [facility] General Strength Comments: tested sitting in WC RLE Strength Right Hip Flexion: 0/5 Right Hip ABduction: 0/5 Right Hip ADduction: 0/5 Right Knee Flexion: 1/5 Right Knee Extension: 0/5 Right Ankle Dorsiflexion: 0/5 Right Ankle Plantar Flexion: 0/5 LLE  Assessment LLE Assessment: Within Functional Limits General Strength Comments: tested sitting in [facility]  Care Tool Care Tool Bed Mobility Roll left and right activity        Sit to lying activity        Lying to sitting on side of bed activity         Care Tool Transfers Sit to stand transfer   Sit to stand assist level: Moderate Assistance - Patient 50 - 74%    Chair/bed transfer        Careers Adviser transfer assist level: Moderate Assistance - Patient 50 - 74%      Care Tool Locomotion Ambulation   Assist level: 2 helpers Assistive device: Other (comment) (L handrial) Max distance: 56ft  Walk 10 feet activity   Assist level: 2 helpers Assistive device: Other (comment) (L handrail)   Walk 50 feet with 2 turns activity Walk 50 feet with 2 turns activity did not occur: Safety/medical concerns (weakness, fatigue)      Walk 150 feet activity Walk 150 feet activity did not occur: Safety/medical concerns (weakness, fatigue)      Walk 10 feet on uneven surfaces activity Walk 10 feet on uneven surfaces activity did not occur: Safety/medical concerns (weakness, fatigue)      Stairs Stair activity did not occur: Safety/medical concerns (weakness, fatigue)        Walk up/down 1 step activity Walk up/down 1 step or curb (drop down) activity did not occur: Safety/medical concerns (weakness, fatigue)      Walk up/down 4 steps activity Walk up/down 4 steps activity did not occur: Safety/medical concerns (weakness, fatigue)      Walk up/down 12 steps activity Walk up/down 12 steps activity did not occur: Safety/medical concerns (weakness, fatigue)      Pick up small objects from floor Pick up small object from the floor (from standing position) activity did not occur: Safety/medical concerns (weakness, fatigue)      Wheelchair Is the patient using a wheelchair?: Yes Type of Wheelchair: Manual   Wheelchair assist level: Dependent - Patient 0% Max wheelchair distance:  >384ft  Wheel 50 feet with 2 turns activity   Assist Level: Dependent - Patient 0%  Wheel 150 feet activity   Assist Level: Dependent - Patient 0%    Refer to Care Plan for Long Term Goals  SHORT TERM GOAL WEEK 1 PT Short Term Goal 1 (Week 1): pt will transfer supine<>sitting EOB with mod A consistantly PT Short Term Goal 2 (Week 1): pt will transfer sit<>stand with LRAD and min A PT Short Term Goal 3 (Week 1): pt will transfer bed<>chair with LRAD and min A  Recommendations for other services: Neuropsych and Therapeutic Recreation  Pet therapy, Stress management, and Outing/community reintegration  Skilled Therapeutic Intervention Evaluation completed (see details above and below) with education on PT POC and goals and individual treatment initiated with focus on functional mobility/transfers, generalized strengthening and endurance, dynamic standing balance/coordination, simulated car transfers, and ambulation. Received pt sitting in WC  with daughter, Lynnea, at bedside and present to observe session. Pt educated on PT evaluation, CIR policies, and therapy schedule and agreeable. Pt denied any pain during session.   Pt transported to ortho gym in [facility] dependently for time management purposes. Pt performed simulated car transfer without AD and mod A while blocking R knee. Pt required assist to get RLE in/out of car and cues for weight shifting and stepping sequence. Donned R DF ace wrap and stood with L handrail and mod A and ambulated 26ft with L handrail and max A with +2 for WC follow with RUE around therapist's shoulder. Pt required assist to advance, place, and block R knee from buckling/hyperextending and cues for upright posture/gaze.   Pt performed seated BLE strengthening on Kinetron at 20 cm/sec for 1 minute increasing to 15 cm/sec for 1 minute x 3 additional trials with therapist assisting with proper placement/alignment of RLE with emphasis on glute/quad strength. Then instructed in  seated AAROM R knee flexion/extension x5 reps and encouraged pt/daughter to work on this exercise in between therapy sessions. Returned to room and pt reported urge to toilet - utilized opportunity for caregiver education. Paris assisted with Stedy transfer WC<>toilet with bedside commode over top (provided min A) while therapist educated Paris on transfer setup/safety [employer] demo very good safety awareness and understanding of transfer with caution for RUE/RLE). Paris assisted with clothing management and pt left seated on commode with all needs within reach and Paris at bedside. Safety plan updated and RN made aware.   Mobility Transfers Transfers: Sit to Stand;Stand to Sit;Stand Pivot Transfers Sit to Stand: Moderate Assistance - Patient 50-74% Stand to Sit: Moderate Assistance - Patient 50-74% Transfer (Assistive device): Other (Comment) (LUE support) Locomotion  Gait Ambulation: Yes Gait Assistance: 2 Helpers Gait Distance (Feet): 15 Feet Assistive device: Other (Comment) (L handrail) Gait Assistance Details: Tactile cues for initiation;Tactile cues for placement;Tactile cues for sequencing;Tactile cues for weight beaing;Tactile cues for weight shifting;Tactile cues for posture;Verbal cues for technique;Verbal cues for sequencing;Verbal cues for gait pattern;Manual facilitation for weight bearing;Manual facilitation for placement;Manual facilitation for weight shifting Gait Assistance Details: verbal/tactile cues for stepping sequence, assist to advance, place, and block R knee Gait Gait: Yes Gait Pattern: Impaired Gait Pattern: Step-to pattern;Decreased step length - right;Decreased step length - left;Decreased stance time - right;Decreased stride length;Decreased hip/knee flexion - right;Decreased dorsiflexion - right;Decreased weight shift to right;Poor foot clearance - right;Poor foot clearance - left;Narrow base of support;Trunk flexed Gait velocity: decreased Stairs / Additional  Locomotion Stairs: No Wheelchair Mobility Wheelchair Mobility: Yes Wheelchair Assistance: Dependent - Patient 0% Wheelchair Parts Management: Needs assistance Distance: >380ft   Discharge Criteria: Patient will be discharged from PT if patient refuses treatment 3 consecutive times without medical reason, if treatment goals not met, if there is a change in medical status, if patient makes no progress towards goals or if patient is discharged from hospital.  The above assessment, treatment plan, treatment alternatives and goals were discussed and mutually agreed upon: by patient and by family  Tammye Kahler M Zaunegger Lanijah Warzecha Zaunegger PT, DPT 02/06/2024, 2:08 PM

## 2024-02-06 NOTE — Evaluation (Signed)
 Speech Language Pathology Assessment and Plan  Patient Details  Name: URI COVEY MRN: [id number] Date of Birth: May 01, 1960  SLP Diagnosis: Dysarthria;Cognitive Impairments;Dysphagia  Rehab Potential: Good ELOS: 2-3 weeks    Today's Date: 02/06/2024 SLP Individual Time: 1000-1100 SLP Individual Time Calculation (min): 60 min   Hospital Problem: Principal Problem:   Left basal ganglia embolic stroke (HCC) Active Problems:   CVA (cerebral vascular accident) [facility])  Past Medical History:  Past Medical History:  Diagnosis Date   Borderline diabetes    Hypertension    Past Surgical History:  Past Surgical History:  Procedure Laterality Date   ABDOMINAL HYSTERECTOMY     CESAREAN SECTION     SHOULDER SURGERY     TRANSESOPHAGEAL ECHOCARDIOGRAM (CATH LAB) N/A 02/02/2024   Procedure: TRANSESOPHAGEAL ECHOCARDIOGRAM;  Surgeon: Santo Stanly LABOR, MD;  Location: [facility] INVASIVE CV LAB;  Service: Cardiovascular;  Laterality: N/A;    Assessment / Plan / Recommendation Clinical Impression   A 64 y.o. female with history of HTN, DM, HLD, OSA who presented to [facility] on 01/29/24  complaining of altered mental status and weakness, starting in right arm which started on 12/31 around 5 in the afternoon. Code stroke was activated by EDP.  Evaluated emergently by teleneurology. MRI shows acute basil ganglia infarct. Transferred to [facility] on 01/29/24 for further evaluation.   PT/OT/SLP evaluations completed with recommendations for acute inpatient rehab admission.  Cognitive-Linguistic Evaluation: Patient presents with moderate cognitive deficits in the areas of memory, attention, problem solving, visuospatial skills, and awareness of deficits as well as mild dysarthria characterized by imprecise articulation and reduced vocal intensity. SLP administered RBANS examination and patient's overall composite score was 57 with normal range falling between 90 and 110 with standard  deviation of 10. Prior to testing patient reports no changes to cognitive skills, however endorsed surprise at her performance after testing, stating that she noticed changes in her perceived performance compared to her expectations. Prior to stroke, patient was independently managing schedule, medications, finances, and driving activities. Patient would benefit from ongoing SLP services targeting all aforementioned cognitive linguistic deficits.  Bedside Swallow Evaluation: Patient presents with clinical swallowing function positive for trace oral dysphagia impacted by R sided facial weakness. Patient endorses prolonged mastication and independent use of lingual and digital sweep to clear intermittent pocketing of solids. Patient consumed thin liquids and regular solids during session with SLP noting mild residue which patient cleared using aforementioned strategies. Patient endorses she would like to target oral weakness during stay to improve swallowing function. Recommend continuation of regular/thin liquid diet with medications administered whole in thin liquids. SLP will continue to follow to target oral strength and clearance.   Patient was left in chair with call bell in reach and chair alarm set. SLP will continue to target goals per plan of care.     Skilled Therapeutic Interventions          SLP conducted skilled evaluation session to assess cognitive-linguistic function. Utilized RBANS and patient and/or family interview. Full results above.   SLP Assessment  Patient will need skilled Speech Lanaguage Pathology Services during CIR admission    Recommendations  SLP Diet Recommendations: Age appropriate regular solids;Thin Liquid Administration via: Cup;Straw Medication Administration: Whole meds with liquid Supervision: Patient able to self feed Compensations: Slow rate;Small sips/bites;Lingual sweep for clearance of pocketing Postural Changes and/or Swallow Maneuvers: Seated upright 90  degrees Oral Care Recommendations: Oral care BID Recommendations for Other Services: Neuropsych consult Patient destination: Home  Follow up Recommendations: Outpatient SLP Equipment Recommended: None recommended by SLP    SLP Frequency 3 to 5 out of 7 days   SLP Duration  SLP Intensity  SLP Treatment/Interventions 2-3 weeks  Minumum of 1-2 x/day, 30 to 90 minutes  Cognitive remediation/compensation;Environmental controls;Cueing hierarchy;Functional tasks;Internal/external aids;Dysphagia/aspiration precaution training;Patient/family education;Therapeutic Activities    Pain Pain Assessment Pain Scale: 0-10 Pain Score: 0-No pain  Prior Functioning Cognitive/Linguistic Baseline: Within functional limits Type of Home: House  Lives With: Spouse Available Help at Discharge: Family;Available 24 hours/day Education: 1 year of college Vocation: Retired  Architectural Technologist Overall Cognitive Status: Impaired/Different from baseline Arousal/Alertness: Awake/alert Orientation Level: Oriented X4 Year: 2026 Month: January Day of Week: Correct Attention: Sustained Focused Attention: Appears intact Sustained Attention: Impaired Sustained Attention Impairment: Verbal complex;Functional complex Memory: Impaired Memory Impairment: Decreased short term memory Decreased Short Term Memory: Verbal basic;Functional basic Awareness: Impaired Awareness Impairment: Intellectual impairment;Emergent impairment;Anticipatory impairment Problem Solving: Impaired Problem Solving Impairment: Verbal complex;Functional complex Executive Function: Sequencing;Organizing Sequencing: Impaired Sequencing Impairment: Verbal complex;Functional complex Organizing: Impaired Organizing Impairment: Verbal complex;Functional complex Safety/Judgment: Appears intact  Comprehension Auditory Comprehension Overall Auditory Comprehension: Appears within functional limits for tasks  assessed Expression Expression Primary Mode of Expression: Verbal Verbal Expression Overall Verbal Expression: Impaired Initiation: No impairment Level of Generative/Spontaneous Verbalization: Conversation Repetition: No impairment Naming: Impairment Responsive: Not tested Confrontation: Not tested Other Naming Comments: conversational word finding errors, mild Verbal Errors: Semantic paraphasias Pragmatics: Impairment Impairments: Abnormal affect;Monotone Interfering Components: Attention Effective Techniques: Semantic cues Non-Verbal Means of Communication: Not applicable Other Verbal Expression Comments: n/a Written Expression Dominant Hand: Right Written Expression: Not tested Oral Motor Oral Motor/Sensory Function Overall Oral Motor/Sensory Function: Mild impairment Facial ROM: Reduced right Facial Symmetry: Abnormal symmetry right Facial Strength: Reduced right Facial Sensation: Within Functional Limits Lingual ROM: Reduced right Lingual Symmetry: Within Functional Limits Lingual Strength: Reduced Velum: Within Functional Limits Mandible: Within Functional Limits Motor Speech Overall Motor Speech: Impaired Respiration: Within functional limits Phonation: Low vocal intensity Resonance: Within functional limits Articulation: Impaired Level of Impairment: Phrase Intelligibility: Intelligibility reduced Word: 75-100% accurate Phrase: 75-100% accurate Sentence: 75-100% accurate Conversation: 75-100% accurate Motor Planning: Within functional limits Motor Speech Errors: Aware Effective Techniques: Slow rate;Increased vocal intensity;Over-articulate;Pacing  Care Tool Care Tool Cognition Ability to hear (with hearing aid or hearing appliances if normally used Ability to hear (with hearing aid or hearing appliances if normally used): 0. Adequate - no difficulty in normal conservation, social interaction, listening to TV   Expression of Ideas and Wants Expression of  Ideas and Wants: 4. Without difficulty (complex and basic) - expresses complex messages without difficulty and with speech that is clear and easy to understand   Understanding Verbal and Non-Verbal Content Understanding Verbal and Non-Verbal Content: 4. Understands (complex and basic) - clear comprehension without cues or repetitions  Memory/Recall Ability Memory/Recall Ability : Current season;Location of own room;That he or she is in a hospital/hospital unit   Intelligibility: Intelligibility reduced Word: 75-100% accurate Phrase: 75-100% accurate Sentence: 75-100% accurate Conversation: 75-100% accurate  Bedside Swallowing Assessment General Date of Onset: 01/28/24 Previous Swallow Assessment: none Diet Prior to this Study: Regular;Thin liquids (Level 0) Temperature Spikes Noted: No Respiratory Status: Room air History of Recent Intubation: No Behavior/Cognition: Alert;Cooperative;Pleasant mood Oral Cavity - Dentition: Missing dentition Self-Feeding Abilities: Able to feed self Vision: Functional for self-feeding Patient Positioning: Upright in chair/Tumbleform Baseline Vocal Quality: Low vocal intensity Volitional Cough: Weak Volitional Swallow: Able to elicit  Oral Care Assessment Oral Assessment  (  WDL): Exceptions to WDL Lips: Asymmetrical Teeth: Missing (Comment) Tongue: Pink Mucous Membrane(s): Moist Saliva: Moist, saliva free flowing Level of Consciousness: Alert Is patient on any of following O2 devices?: None of the above Nutritional status: No high risk factors Oral Assessment Risk : Low Risk Ice Chips Ice chips: Not tested Thin Liquid Thin Liquid: Within functional limits Presentation: Self Fed;Straw;Cup Nectar Thick Nectar Thick Liquid: Not tested Honey Thick Honey Thick Liquid: Not tested Puree Puree: Not tested Solid Solid: Impaired Presentation: Self Fed Oral Phase Impairments: Impaired mastication Oral Phase Functional Implications: Prolonged  oral transit BSE Assessment Risk for Aspiration Impact on safety and function: No limitations  Short Term Goals: Week 1: SLP Short Term Goal 1 (Week 1): Patient will tolerate regular/thin liquid diet with supervision for use of oral clearance strategies. SLP Short Term Goal 2 (Week 1): Patient will attend to functional therapy tasks for 30 minutes with min assist. SLP Short Term Goal 3 (Week 1): Patient will recall daily events using external and internal memory aids with min assist. SLP Short Term Goal 4 (Week 1): Patient will solve mildly complex functional problems with min assist. SLP Short Term Goal 5 (Week 1): Patient will utilize speech intelligibility strategies at the sentence level with min assist.  Refer to Care Plan for Long Term Goals  Recommendations for other services: Neuropsych  Discharge Criteria: Patient will be discharged from SLP if patient refuses treatment 3 consecutive times without medical reason, if treatment goals not met, if there is a change in medical status, if patient makes no progress towards goals or if patient is discharged from hospital.  The above assessment, treatment plan, treatment alternatives and goals were discussed and mutually agreed upon: by patient and by family  King Pinzon A Ozell Ferrera 02/06/2024, 11:56 AM

## 2024-02-06 NOTE — Progress Notes (Signed)
 "                                                        PROGRESS NOTE   Subjective/Complaints: No new complaints this morning Denies pain Hemiplegic on right side but with intact sensation  ROS: +right sided hemiplegia   Objective:   No results found. Recent Labs    02/06/24 0542  WBC 8.0  HGB 13.7  HCT 41.9  PLT 261   Recent Labs    02/06/24 0542  NA 141  K 3.1*  CL 100  CO2 30  GLUCOSE 145*  BUN 37*  CREATININE 1.22*  CALCIUM  9.3    Intake/Output Summary (Last 24 hours) at 02/06/2024 1035 Last data filed at 02/06/2024 0800 Gross per 24 hour  Intake 236 ml  Output --  Net 236 ml        Physical Exam: Vital Signs Blood pressure 118/70, pulse 63, temperature 98 F (36.7 C), temperature source Oral, resp. rate 18, height 5' (1.524 m), weight 105.7 kg, SpO2 98%. Gen: no distress, normal appearing HEENT: oral mucosa pink and moist, NCAT Cardio: Reg rate Chest: normal effort, normal rate of breathing Abd: soft, non-distended Ext: no edema Psych: pleasant, normal affect Skin: intact Neuro: Alert and oriented x3, right sided hemiplegia with 0/5 strength throughout, sensation is intact, right sided facial droop   Assessment/Plan: 1. Functional deficits which require 3+ hours per day of interdisciplinary therapy in a comprehensive inpatient rehab setting. Physiatrist is providing close team supervision and 24 hour management of active medical problems listed below. Physiatrist and rehab team continue to assess barriers to discharge/monitor patient progress toward functional and medical goals  Care Tool:  Bathing    Body parts bathed by patient: Right arm, Chest, Abdomen, Front perineal area, Buttocks, Left upper leg, Left lower leg, Face   Body parts bathed by helper: Left arm, Right lower leg, Right upper leg     Bathing assist Assist Level: Moderate Assistance - Patient 50 - 74%     Upper Body Dressing/Undressing Upper body dressing   What is the  patient wearing?: Pull over shirt    Upper body assist Assist Level: Maximal Assistance - Patient 25 - 49%    Lower Body Dressing/Undressing Lower body dressing      What is the patient wearing?: Incontinence brief, Pants     Lower body assist Assist for lower body dressing: Total Assistance - Patient < 25%     Toileting Toileting    Toileting assist Assist for toileting: Maximal Assistance - Patient 25 - 49%     Transfers Chair/bed transfer  Transfers assist     Chair/bed transfer assist level: Dependent - Patient 0% (stedy)     Locomotion Ambulation   Ambulation assist              Walk 10 feet activity   Assist           Walk 50 feet activity   Assist           Walk 150 feet activity   Assist           Walk 10 feet on uneven surface  activity   Assist           Wheelchair     Assist  Wheelchair 50 feet with 2 turns activity    Assist            Wheelchair 150 feet activity     Assist          Blood pressure 118/70, pulse 63, temperature 98 F (36.7 C), temperature source Oral, resp. rate 18, height 5' (1.524 m), weight 105.7 kg, SpO2 98%.  Medical Problem List and Plan: 1. 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 Continue Asa and Plavix  with statin. Follow up with Dr. Chalice in 4 weeks.              -patient may  shower             -ELOS/Goals: 14-18 days due to R hemiplegia- Min A hopefully, goals, mod I to supervision for SLP/dysarthria             Chart and therapy notes reviewed, continue CIR, discussed patient's progress with therapy Grounds pass ordered Vitamin D3/Metanx/Vitamin B/C complex ordered F/u with Dr. JONELLE or Fidela in clinic within 1 month Would benefit from home health aide upon discharge    2.  Antithrombotics: continue Lovenox    3. Pain Management: Tylenol  prn    4. Mood/Behavior/Sleep: LCSW  to follow for evaluation and support when available.              -antipsychotic agents: N/A   5. Neuropsych/cognition: This patient is capable of making decisions on her own behalf.   6. Skin/Wound Care: Routine pressure relief measures.    7. AKI: encouraged oral hydration   8. Screening for vitamin D  deficiency: add on vitamin D  level today   9. HTN: BP reviewed and is normal, continue Amlodipine , Chlorthalidone , Avapro  and Bystolic .  Monitor BP with activity.    10. DM2: A1c 7.2, Home metformin  held, resumed Farxiga . Monitor glucose ac/hs with SSI and meal time coverage.     11. HLD: Lipitor 80 mg daily    12. OSA: CPAP nightly    13. Left Thyroid Nodule: 2.2 cm thyroid nodule found on CT.  Follow up outpatient for non emergent thyroid US .    14. Class 4 Obesity:  BMI is 47.85 kg/m . Educate on diet and weight loss to promote overall health and mobility.    15. Constipation- will order Miralax  that she's taking as well as Dulcolax suppository tonight to help her get back on schedule. - She also wants apple juice. LBM 1/7  16. Hypokalemia: supplement 40meq BID 2 doses today, repeat BMP tomorrow  LOS: 1 days A FACE TO FACE EVALUATION WAS PERFORMED  Marisa [name] P Jovani Colquhoun 02/06/2024, 10:35 AM     "

## 2024-02-06 NOTE — Progress Notes (Signed)
 Patient ID: Marisa [name], female   DOB: Jul 04, 1960, 64 y.o.   MRN: [id number]  Statement of service delivered.

## 2024-02-07 DIAGNOSIS — R739 Hyperglycemia, unspecified: Secondary | ICD-10-CM

## 2024-02-07 DIAGNOSIS — E876 Hypokalemia: Secondary | ICD-10-CM

## 2024-02-07 DIAGNOSIS — N179 Acute kidney failure, unspecified: Secondary | ICD-10-CM

## 2024-02-07 DIAGNOSIS — I10 Essential (primary) hypertension: Secondary | ICD-10-CM | POA: Diagnosis not present

## 2024-02-07 DIAGNOSIS — I639 Cerebral infarction, unspecified: Secondary | ICD-10-CM | POA: Diagnosis not present

## 2024-02-07 LAB — BASIC METABOLIC PANEL WITH GFR
Anion gap: 11 (ref 5–15)
BUN: 40 mg/dL — ABNORMAL HIGH (ref 8–23)
CO2: 27 mmol/L (ref 22–32)
Calcium: 9.1 mg/dL (ref 8.9–10.3)
Chloride: 101 mmol/L (ref 98–111)
Creatinine, Ser: 1.07 mg/dL — ABNORMAL HIGH (ref 0.44–1.00)
GFR, Estimated: 58 mL/min — ABNORMAL LOW
Glucose, Bld: 139 mg/dL — ABNORMAL HIGH (ref 70–99)
Potassium: 3.1 mmol/L — ABNORMAL LOW (ref 3.5–5.1)
Sodium: 139 mmol/L (ref 135–145)

## 2024-02-07 LAB — GLUCOSE, CAPILLARY
Glucose-Capillary: 149 mg/dL — ABNORMAL HIGH (ref 70–99)
Glucose-Capillary: 145 mg/dL — ABNORMAL HIGH (ref 70–99)
Glucose-Capillary: 176 mg/dL — ABNORMAL HIGH (ref 70–99)
Glucose-Capillary: 153 mg/dL — ABNORMAL HIGH (ref 70–99)

## 2024-02-07 MED ORDER — POTASSIUM CHLORIDE CRYS ER 20 MEQ PO TBCR
40.0000 meq | EXTENDED_RELEASE_TABLET | Freq: Two times a day (BID) | ORAL | Status: AC
  Administered 2024-02-07 – 2024-02-09 (×6): 40 meq via ORAL
  Filled 2024-02-07 (×6): qty 2

## 2024-02-07 NOTE — Progress Notes (Signed)
 Patient refused cpap for the night. RT informed patient to call if she would like to wear it.

## 2024-02-07 NOTE — Progress Notes (Signed)
 Physical Therapy Session Note  Patient Details  Name: Marisa [name] MRN: [id number] Date of Birth: September 02, 1960  Today's Date: 02/07/2024 PT Individual Time: 0700-0756 and [id number] PT Individual Time Calculation (min): 56 min and 43 min  Short Term Goals: Week 1:  PT Short Term Goal 1 (Week 1): pt will transfer supine<>sitting EOB with mod A consistantly PT Short Term Goal 2 (Week 1): pt will transfer sit<>stand with LRAD and min A PT Short Term Goal 3 (Week 1): pt will transfer bed<>chair with LRAD and min A  Skilled Therapeutic Interventions/Progress Updates:   Treatment Session 1 Received pt semi-reclined in bed finishing breakfast. Pt agreeable to PT treatment and denied any pain during session. Session with emphasis on functional mobility/transfers, dressing, toileting, generalized strengthening and endurance, dynamic standing balance/coordination, and NMR. Located 20x17 utralight hemi height manual WC (Helio A6) for improved fit and pt transferred semi-reclined<>sitting R EOB with HOB elevated and mod A for RLE management and trunk control. Removed gown and donned pull over shirt with max A to thread RUE through (pt with good recall of threading weak arm through first). Donned leggings sitting EOB with max A for RLE management, then pt reported sudden urge to toilet. Retrieved Stedy due to urgency and performed all stands in [place] with CGA. Transferred to/from toilet with bedside commode over top dependently. Removed old brief and pt able to void and with loose BM. Pt dependent for hygiene management in standing. However, noted increased fatigue with prolonged standing and pt with 1 instance of significant buckling in [place]. Donned clean brief with max A and required max A to pull pants over hips. Performed dependent transfer into WC and required max A to scoot R hip back in chair. Pt sat in WC at sink and brushed teeth/washed face with supervision. Daughter assisted with grooming, then  donned shoes with max A and instructed pt in WC mobility using LUE/LLE for 16ft with supervision. Pt occasionally running into obstacles on R side and would benefit from continued practice. Concluded session with pt sitting in [facility] with all needs within reach and daughter at bedside. Requested grounds pass from MD.   Treatment Session 2 Received pt sitting in [facility] with daughter at bedside. Pt agreeable to PT treatment and denied any pain during session. Session with emphasis on functional mobility/transfers, generalized strengthening and endurance, dynamic standing balance/coordination, NMR, and gait training. RN arrived to administer medication and donned shoes with max A. Pt transported to/from room in [facility] dependently for time management purposes. Donned R DF ace wrap, then stood in hallway with L handrail and mod A and ambulated 28ft x 2 trials with L handrail and max A with RUE around therapist's shoulders using mirror for visual feedback. Pt required assist to advance, place, and block R knee from buckling/hyperextending and cues for weight shifiting at hips and stepping sequence. Pt then performed x20 standing mini squats with LUE support and RUE around therapist with mod A for balance and mod blocking at R knee (only 1 instance of buckling). Returned to room and concluded session with pt sitting in WC, needs within reach, and seatbelt alarm on.   Therapy Documentation Precautions:  Precautions Precautions: Fall Recall of Precautions/Restrictions: Intact Precaution/Restrictions Comments: R dense hemi Restrictions Weight Bearing Restrictions Per Provider Order: No  Therapy/Group: Individual Therapy Therisa HERO Zaunegger Therisa Stains PT, DPT 02/07/2024, 6:34 AM

## 2024-02-07 NOTE — Progress Notes (Signed)
 "                                                        PROGRESS NOTE   Subjective/Complaints:  Pt doing well, working with OT. Slept well, denies pain. LBM this morning but per report looks like just a smear, had a larger one yesterday. Urinating fine per pt but documentation stating incontinence. No other complaints or concerns.   ROS: +right sided hemiplegia. See HPI, denies CP, SOB, abd pain, n/v/d/c   Objective:   No results found. Recent Labs    02/06/24 0542  WBC 8.0  HGB 13.7  HCT 41.9  PLT 261   Recent Labs    02/06/24 0542 02/07/24 0534  NA 141 139  K 3.1* 3.1*  CL 100 101  CO2 30 27  GLUCOSE 145* 139*  BUN 37* 40*  CREATININE 1.22* 1.07*  CALCIUM  9.3 9.1    Intake/Output Summary (Last 24 hours) at 02/07/2024 1305 Last data filed at 02/07/2024 1300 Gross per 24 hour  Intake 349 ml  Output --  Net 349 ml        Physical Exam: Vital Signs Blood pressure 114/65, pulse (!) 57, temperature 98 F (36.7 C), resp. rate 18, height 5' (1.524 m), weight 105.7 kg, SpO2 96%.  Constitutional: No distress . Vital signs reviewed. Sitting in w/c working with OT HEENT: NCAT, EOMI, oral membranes moist Neck: supple Cardiovascular: RRR without m/r/g appreciated. No JVD    Respiratory/Chest: CTA Bilaterally without wheezes or rales. Normal effort    GI/Abdomen: soft, +BS throughout, non-tender, non-distended Ext: no clubbing or cyanosis, nonpitting edema BLE Psych: pleasant and cooperative Skin: dry, warm Neuro: right sided hemiplegia, sensation is intact, right sided facial droop   Assessment/Plan: 1. Functional deficits which require 3+ hours per day of interdisciplinary therapy in a comprehensive inpatient rehab setting. Physiatrist is providing close team supervision and 24 hour management of active medical problems listed below. Physiatrist and rehab team continue to assess barriers to discharge/monitor patient progress toward functional and medical  goals  Care Tool:  Bathing    Body parts bathed by patient: Right arm, Chest, Abdomen, Front perineal area, Buttocks, Left upper leg, Left lower leg, Face   Body parts bathed by helper: Left arm, Right lower leg, Right upper leg     Bathing assist Assist Level: Moderate Assistance - Patient 50 - 74%     Upper Body Dressing/Undressing Upper body dressing   What is the patient wearing?: Pull over shirt    Upper body assist Assist Level: Maximal Assistance - Patient 25 - 49%    Lower Body Dressing/Undressing Lower body dressing      What is the patient wearing?: Incontinence brief, Pants     Lower body assist Assist for lower body dressing: Total Assistance - Patient < 25%     Toileting Toileting    Toileting assist Assist for toileting: Maximal Assistance - Patient 25 - 49%     Transfers Chair/bed transfer  Transfers assist     Chair/bed transfer assist level: Maximal Assistance - Patient 25 - 49%     Locomotion Ambulation   Ambulation assist      Assist level: 2 helpers Assistive device: Other (comment) (L handrial) Max distance: 48ft   Walk 10 feet activity   Assist  Assist level: 2 helpers Assistive device: Other (comment) (L handrail)   Walk 50 feet activity   Assist Walk 50 feet with 2 turns activity did not occur: Safety/medical concerns (weakness, fatigue)         Walk 150 feet activity   Assist Walk 150 feet activity did not occur: Safety/medical concerns (weakness, fatigue)         Walk 10 feet on uneven surface  activity   Assist Walk 10 feet on uneven surfaces activity did not occur: Safety/medical concerns (weakness, fatigue)         Wheelchair     Assist Is the patient using a wheelchair?: Yes Type of Wheelchair: Manual    Wheelchair assist level: Supervision/Verbal cueing Max wheelchair distance: 135ft    Wheelchair 50 feet with 2 turns activity    Assist        Assist Level:  Supervision/Verbal cueing   Wheelchair 150 feet activity     Assist      Assist Level: Dependent - Patient 0%   Blood pressure 114/65, pulse (!) 57, temperature 98 F (36.7 C), resp. rate 18, height 5' (1.524 m), weight 105.7 kg, SpO2 96%.  Medical Problem List and Plan: 1. 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 Continue Asa and Plavix  with statin. Follow up with Dr. Chalice in 4 weeks.              -patient may  shower -ELOS/Goals: 14-18 days due to R hemiplegia- Min A hopefully, goals, mod I to supervision for SLP/dysarthria Chart and therapy notes reviewed, continue CIR, discussed patient's progress with therapy Grounds pass ordered Vitamin D3/Metanx/Vitamin B/C complex ordered F/u with Dr. JONELLE or Fidela in clinic within 1 month Would benefit from home health aide upon discharge    2.  Antithrombotics: continue Lovenox  40mg  daily; antiplatelet ASA and plavix  daily   3. Pain Management: Tylenol  prn    4. Mood/Behavior/Sleep: LCSW to follow for evaluation and support when available.              -antipsychotic agents: N/A  -trazodone  PRN   5. Neuropsych/cognition: This patient is capable of making decisions on her own behalf.   6. Skin/Wound Care: Routine pressure relief measures.    7. FEN/AKI: encouraged oral hydration, continue vitamins/supplements, continue routine labs.   -02/07/24 Cr 1.07, improving   8. Screening for vitamin D  deficiency: D level 28.1 on 02/06/24, continue D3 1000U daily   9. HTN: BP reviewed and is normal, continue Amlodipine  5mg  daily, Chlorthalidone  25mg  daily, Avapro  37.5mg  daily, and Bystolic  10mg  daily.  Monitor BP with activity.   -BPs stable, monitor Vitals:   02/05/24 1624 02/05/24 2011 02/06/24 0600 02/06/24 1449  BP: 137/67 136/68 118/70 117/64   02/06/24 2009 02/07/24 0549  BP: 127/65 114/65     10. DM2: A1c 7.2, Home metformin  held, resumed Farxiga  10mg  daily. Monitor  glucose ac/hs with SSI and meal time coverage.    -02/07/24 CBGs look good, monitor  CBG (last 3)  Recent Labs    02/06/24 2141 02/07/24 0639 02/07/24 1116  GLUCAP 136* 149* 145*      11. HLD: Lipitor 80 mg daily    12. OSA: CPAP nightly    13. Left Thyroid Nodule: 2.2 cm thyroid nodule found on CT.  Follow up outpatient for non emergent thyroid US .    14. Class 4 Obesity:  BMI is 47.85 kg/m . Educate on diet and weight loss to  promote overall health and mobility.    15. Constipation- will order Miralax  daily that she's taking as well as Dulcolax suppository tonight to help her get back on schedule. - She also wants apple juice. LBM 1/7. Also on senokot s 2 tabs nightly.   -02/07/24 LBM this morning smear, but yesterday large; monitor  16. Hypokalemia: supplement 40meq BID 2 doses today, repeat BMP tomorrow -02/07/24 still no improvement, K 3.1, will order Klor 40mEq BID x3d, recheck Monday.    LOS: 2 days A FACE TO FACE EVALUATION WAS PERFORMED  [address] 02/07/2024, 1:05 PM     "

## 2024-02-07 NOTE — Progress Notes (Signed)
 Occupational Therapy Session Note  Patient Details  Name: Marisa [name] MRN: [id number] Date of Birth: 12-18-1960  Today's Date: 02/07/2024 OT Individual Time: 1000-1115 OT Individual Time Calculation (min): 75 min    Short Term Goals: Week 1:  OT Short Term Goal 1 (Week 1): Pt will complete toilet transfer with mod A OT Short Term Goal 2 (Week 1): Pt will complete LB dressing with mod A OT Short Term Goal 3 (Week 1): Pt will complete sit > stand with min A using LRAD  Skilled Therapeutic Interventions/Progress Updates:    Pt received in wc with her daughter Annabella present. Pt dressed and ready for the day.  Discussed the plan for the session and pt was eager to participate. Invited daughter to the session.  Pt taken to main gym and demonstrate to pt first technique for forward weight shift, lifting hips and pivoting hips to squat pivot transfer.   Pt did well completing the hip lift with mod A and then pivoting to her R side mod A to mat.  At end of session needed max A in 2-3 scoots to move back to wc to her L side.   Pt demonstrated strong sit balance sitting EOM while engaging in RUE NMR with table top slides. Trace movement noted in scapular elevation and biceps.  Explained estim and pt eager to try. 20 min of estim on forearm at intensity 25 for wrist and finger extension with integrating grasp and release and forearm rotation activities.   Placed estim on deltoids at pt tolerated intensity 25.  Was about to begin ROM with estim but pt needed to toilet.   Sq pvt transfer back to wc and then in room used stedy to access toilet. Placed R hand in gait belt (left estim on during this time).  Pt pulled to stand with min A using a good forward lean.  Max A with toileting, pt was continent and only voided urine.  Her daughter assisted with cleansing.   Pt moved in stedy back to room and had her practice standing in stedy for 3 min with lateral weight shifts to engage RLE.  Pt  participated extremely well. After sitting in wc, estim doffed. Pt tolerated well with no adverse affects.  In room with dtr present.    Therapy Documentation Precautions:  Precautions Precautions: Fall Recall of Precautions/Restrictions: Intact Precaution/Restrictions Comments: R dense hemi Restrictions Weight Bearing Restrictions Per Provider Order: No    Pain: Pain Assessment Pain Score: 0-No pain ADL: ADL Eating: Set up Where Assessed-Eating: Wheelchair Grooming: Supervision/safety Where Assessed-Grooming: Sitting at sink Upper Body Bathing: Moderate assistance Where Assessed-Upper Body Bathing: Edge of bed Lower Body Bathing: Moderate assistance Where Assessed-Lower Body Bathing: Bed level Upper Body Dressing: Maximal assistance Where Assessed-Upper Body Dressing: Edge of bed Lower Body Dressing: Maximal assistance Where Assessed-Lower Body Dressing: Bed level Toileting: Maximal assistance Where Assessed-Toileting: Bed level Toilet Transfer: Dependent Toilet Transfer Method: Other (comment) (stedy) Toilet Transfer Equipment: Bedside commode Tub/Shower Transfer: Unable to assess Tub/Shower Transfer Method: Unable to assess [employer] Transfer: Unable to assess [employer] Method: Unable to assess     Therapy/Group: Individual Therapy  Delsy Etzkorn 02/07/2024, 12:47 PM

## 2024-02-08 DIAGNOSIS — I10 Essential (primary) hypertension: Secondary | ICD-10-CM | POA: Diagnosis not present

## 2024-02-08 DIAGNOSIS — I639 Cerebral infarction, unspecified: Secondary | ICD-10-CM | POA: Diagnosis not present

## 2024-02-08 DIAGNOSIS — E876 Hypokalemia: Secondary | ICD-10-CM | POA: Diagnosis not present

## 2024-02-08 DIAGNOSIS — R739 Hyperglycemia, unspecified: Secondary | ICD-10-CM | POA: Diagnosis not present

## 2024-02-08 LAB — GLUCOSE, CAPILLARY
Glucose-Capillary: 168 mg/dL — ABNORMAL HIGH (ref 70–99)
Glucose-Capillary: 200 mg/dL — ABNORMAL HIGH (ref 70–99)
Glucose-Capillary: 130 mg/dL — ABNORMAL HIGH (ref 70–99)
Glucose-Capillary: 171 mg/dL — ABNORMAL HIGH (ref 70–99)

## 2024-02-08 NOTE — Plan of Care (Signed)
" °  Problem: RH BOWEL ELIMINATION Goal: RH STG MANAGE BOWEL WITH ASSISTANCE Description: STG Manage Bowel with mod I Assistance. Outcome: Progressing Goal: RH STG MANAGE BOWEL W/MEDICATION W/ASSISTANCE Description: STG Manage Bowel with Medication with mod I Assistance. Outcome: Progressing   Problem: RH BLADDER ELIMINATION Goal: RH STG MANAGE BLADDER WITH ASSISTANCE Description: STG Manage Bladder With toileting Assistance Outcome: Progressing   Problem: RH SAFETY Goal: RH STG ADHERE TO SAFETY PRECAUTIONS W/ASSISTANCE/DEVICE Description: STG Adhere to Safety Precautions With cues Assistance/Device. Outcome: Progressing   Problem: RH KNOWLEDGE DEFICIT Goal: RH STG INCREASE KNOWLEDGE OF DIABETES Description: Patient and spouse will be able to manage DM using educational resources for medications and dietary modification independently Outcome: Progressing Goal: RH STG INCREASE KNOWLEDGE OF HYPERTENSION Description: Patient and spouse will be able to manage HTN using educational resources for medications and dietary modification independently Outcome: Progressing Goal: RH STG INCREASE KNOWLEGDE OF HYPERLIPIDEMIA Description: Patient and spouse will be able to manage HLD using educational resources for medications and dietary modification independently Outcome: Progressing Goal: RH STG INCREASE KNOWLEDGE OF STROKE PROPHYLAXIS Description: Patient and spouse will be able to manage secondary risks using educational resources for medications and dietary modification independently Outcome: Progressing   "

## 2024-02-08 NOTE — Progress Notes (Signed)
 Occupational Therapy Session Note  Patient Details  Name: Marisa [name] MRN: [id number] Date of Birth: 05-09-1960  Today's Date: 02/08/2024 OT Individual Time: 8975-8884 OT Individual Time Calculation (min): 51 min    Short Term Goals: Week 1:  OT Short Term Goal 1 (Week 1): Pt will complete toilet transfer with mod A OT Short Term Goal 2 (Week 1): Pt will complete LB dressing with mod A OT Short Term Goal 3 (Week 1): Pt will complete sit > stand with min A using LRAD  Skilled Therapeutic Interventions/Progress Updates:   Initial Encounter:   Patient seated in the room with family present at the time of arrival. Patient indicated that she had no pain to report and the she rested well during the night.   The pt  was in agreement with working on AUTOMATIC DATA, UB strength, and patient education.    NMR and Patient Education: The patient was instructed regarding neuro plasticity in away that she could understand, such as driving home and you decide to take an alternate route. The pt was also instructed regarding the order for return in function and ways to aid in that process.  The pt was instructed to incorporate her sight as a secondary sensory component. The pt went on to tolerate NMR of the LUE starting at the scapular, neck, shld , arm , forearm, and hand.  The patient instructed to incorporate her sight. I provided that pt with a hemi table for placement of the LUE.   UB Exercises:The pt went on to complete UB exercises using t 3lb dumb bell 2 sets of 10 with rest breaks as needed for bicep curls, shld flexion, horizontal abduction and elbow extension with rest breaks as needed, the pt required 2 rest breaks The pt was instructed in theraband exercises for shld flexion, horizontal abduction and elbow extension with effective carry over following initial cues and demonstration. At the end of the session, the pt remained at w/c LOF with her RUE supported and all additional needs addressed.The pt  family was present for the duration of the session and was very supportive.   Therapy Documentation Precautions:  Precautions Precautions: Fall Recall of Precautions/Restrictions: Intact Precaution/Restrictions Comments: R dense hemi Restrictions Weight Bearing Restrictions Per Provider Order: No  Therapy/Group: Individual Therapy  [name] 02/08/2024, 4:25 PM

## 2024-02-08 NOTE — Progress Notes (Signed)
 "                                                        PROGRESS NOTE   Subjective/Complaints:  Pt doing well again today. Slept well, denies pain. LBM yesterday small and day before larger. Urinating fine per pt but documentation stating incontinence. No other complaints or concerns.   ROS: +right sided hemiplegia. See HPI, denies CP, SOB, abd pain, n/v/d/c   Objective:   No results found. Recent Labs    02/06/24 0542  WBC 8.0  HGB 13.7  HCT 41.9  PLT 261   Recent Labs    02/06/24 0542 02/07/24 0534  NA 141 139  K 3.1* 3.1*  CL 100 101  CO2 30 27  GLUCOSE 145* 139*  BUN 37* 40*  CREATININE 1.22* 1.07*  CALCIUM  9.3 9.1    Intake/Output Summary (Last 24 hours) at 02/08/2024 1140 Last data filed at 02/08/2024 0750 Gross per 24 hour  Intake 467 ml  Output --  Net 467 ml        Physical Exam: Vital Signs Blood pressure (!) 120/58, pulse 65, temperature 97.9 F (36.6 C), temperature source Oral, resp. rate 18, height 5' (1.524 m), weight 105.7 kg, SpO2 95%.  Constitutional: No distress . Vital signs reviewed. Sitting in w/c with family in room HEENT: NCAT, EOMI, oral membranes moist Neck: supple Cardiovascular: RRR without m/r/g appreciated. No JVD    Respiratory/Chest: CTA Bilaterally without wheezes or rales. Normal effort    GI/Abdomen: soft, +BS throughout, non-tender, non-distended Ext: no clubbing or cyanosis, nonpitting edema BLE Psych: pleasant and cooperative Skin: dry, warm Neuro: right sided hemiplegia, sensation is intact, right sided facial droop   Assessment/Plan: 1. Functional deficits which require 3+ hours per day of interdisciplinary therapy in a comprehensive inpatient rehab setting. Physiatrist is providing close team supervision and 24 hour management of active medical problems listed below. Physiatrist and rehab team continue to assess barriers to discharge/monitor patient progress toward functional and medical goals  Care  Tool:  Bathing    Body parts bathed by patient: Right arm, Chest, Abdomen, Front perineal area, Buttocks, Left upper leg, Left lower leg, Face   Body parts bathed by helper: Left arm, Right lower leg, Right upper leg     Bathing assist Assist Level: Moderate Assistance - Patient 50 - 74%     Upper Body Dressing/Undressing Upper body dressing   What is the patient wearing?: Pull over shirt    Upper body assist Assist Level: Maximal Assistance - Patient 25 - 49%    Lower Body Dressing/Undressing Lower body dressing      What is the patient wearing?: Incontinence brief, Pants     Lower body assist Assist for lower body dressing: Total Assistance - Patient < 25%     Toileting Toileting    Toileting assist Assist for toileting: Maximal Assistance - Patient 25 - 49%     Transfers Chair/bed transfer  Transfers assist     Chair/bed transfer assist level: Maximal Assistance - Patient 25 - 49%     Locomotion Ambulation   Ambulation assist      Assist level: 2 helpers Assistive device: Other (comment) (L handrial) Max distance: 5ft   Walk 10 feet activity   Assist     Assist level: 2 helpers  Assistive device: Other (comment) (L handrail)   Walk 50 feet activity   Assist Walk 50 feet with 2 turns activity did not occur: Safety/medical concerns (weakness, fatigue)         Walk 150 feet activity   Assist Walk 150 feet activity did not occur: Safety/medical concerns (weakness, fatigue)         Walk 10 feet on uneven surface  activity   Assist Walk 10 feet on uneven surfaces activity did not occur: Safety/medical concerns (weakness, fatigue)         Wheelchair     Assist Is the patient using a wheelchair?: Yes Type of Wheelchair: Manual    Wheelchair assist level: Supervision/Verbal cueing Max wheelchair distance: 184ft    Wheelchair 50 feet with 2 turns activity    Assist        Assist Level: Supervision/Verbal  cueing   Wheelchair 150 feet activity     Assist      Assist Level: Dependent - Patient 0%   Blood pressure (!) 120/58, pulse 65, temperature 97.9 F (36.6 C), temperature source Oral, resp. rate 18, height 5' (1.524 m), weight 105.7 kg, SpO2 95%.  Medical Problem List and Plan: 1. 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 Continue Asa and Plavix  with statin. Follow up with Dr. Chalice in 4 weeks.              -patient may  shower -ELOS/Goals: 14-18 days due to R hemiplegia- Min A hopefully, goals, mod I to supervision for SLP/dysarthria Chart and therapy notes reviewed, continue CIR, discussed patient's progress with therapy Grounds pass ordered Vitamin D3/Metanx/Vitamin B/C complex ordered F/u with Dr. JONELLE or Fidela in clinic within 1 month Would benefit from home health aide upon discharge    2.  Antithrombotics: continue Lovenox  40mg  daily; antiplatelet ASA and plavix  daily   3. Pain Management: Tylenol  prn    4. Mood/Behavior/Sleep: LCSW to follow for evaluation and support when available.              -antipsychotic agents: N/A  -trazodone  PRN   5. Neuropsych/cognition: This patient is capable of making decisions on her own behalf.   6. Skin/Wound Care: Routine pressure relief measures.    7. FEN/AKI: encouraged oral hydration, continue vitamins/supplements, continue routine labs.   -02/07/24 Cr 1.07, improving   8. Screening for vitamin D  deficiency: D level 28.1 on 02/06/24, continue D3 1000U daily   9. HTN: BP reviewed and is normal, continue Amlodipine  5mg  daily, Chlorthalidone  25mg  daily, Avapro  37.5mg  daily, and Bystolic  10mg  daily.  Monitor BP with activity.   -BPs stable, monitor Vitals:   02/05/24 1624 02/05/24 2011 02/06/24 0600 02/06/24 1449  BP: 137/67 136/68 118/70 117/64   02/06/24 2009 02/07/24 0549 02/07/24 1434 02/07/24 2022  BP: 127/65 114/65 116/69 125/67   02/08/24 0548  BP: (!) 120/58      10. DM2: A1c 7.2, Home metformin  held, resumed Farxiga  10mg  daily. Monitor glucose ac/hs with SSI and meal time coverage.    -1/10-11/26 CBGs look good, monitor  CBG (last 3)  Recent Labs    02/07/24 1702 02/07/24 2127 02/08/24 0601  GLUCAP 176* 153* 168*      11. HLD: Lipitor 80 mg daily    12. OSA: CPAP nightly    13. Left Thyroid Nodule: 2.2 cm thyroid nodule found on CT.  Follow up outpatient for non emergent thyroid US .    14. Class 4 Obesity:  BMI is 47.85 kg/m . Educate on diet and weight loss to promote overall health and mobility.    15. Constipation- will order Miralax  daily that she's taking as well as Dulcolax suppository tonight to help her get back on schedule. - She also wants apple juice. LBM 1/7. Also on senokot s 2 tabs nightly.   -02/08/24 LBM yesterday smear, but 1/9 large; monitor  16. Hypokalemia: supplement 40meq BID 2 doses today, repeat BMP tomorrow -02/07/24 still no improvement, K 3.1, will order Klor 40mEq BID x3d, recheck Monday.    LOS: 3 days A FACE TO FACE EVALUATION WAS PERFORMED  [address] 02/08/2024, 11:40 AM     "

## 2024-02-09 DIAGNOSIS — I639 Cerebral infarction, unspecified: Secondary | ICD-10-CM | POA: Diagnosis not present

## 2024-02-09 DIAGNOSIS — G8111 Spastic hemiplegia affecting right dominant side: Secondary | ICD-10-CM | POA: Diagnosis not present

## 2024-02-09 DIAGNOSIS — R7989 Other specified abnormal findings of blood chemistry: Secondary | ICD-10-CM | POA: Diagnosis not present

## 2024-02-09 DIAGNOSIS — I10 Essential (primary) hypertension: Secondary | ICD-10-CM | POA: Diagnosis not present

## 2024-02-09 LAB — GLUCOSE, CAPILLARY
Glucose-Capillary: 138 mg/dL — ABNORMAL HIGH (ref 70–99)
Glucose-Capillary: 115 mg/dL — ABNORMAL HIGH (ref 70–99)
Glucose-Capillary: 123 mg/dL — ABNORMAL HIGH (ref 70–99)
Glucose-Capillary: 138 mg/dL — ABNORMAL HIGH (ref 70–99)

## 2024-02-09 LAB — BASIC METABOLIC PANEL WITH GFR
Anion gap: 10 (ref 5–15)
BUN: 36 mg/dL — ABNORMAL HIGH (ref 8–23)
CO2: 26 mmol/L (ref 22–32)
Calcium: 9.2 mg/dL (ref 8.9–10.3)
Chloride: 104 mmol/L (ref 98–111)
Creatinine, Ser: 1.03 mg/dL — ABNORMAL HIGH (ref 0.44–1.00)
GFR, Estimated: >60 mL/min
Glucose, Bld: 142 mg/dL — ABNORMAL HIGH (ref 70–99)
Potassium: 3.6 mmol/L (ref 3.5–5.1)
Sodium: 140 mmol/L (ref 135–145)

## 2024-02-09 LAB — CBC WITH DIFFERENTIAL/PLATELET
Abs Immature Granulocytes: 0.03 K/uL (ref 0.00–0.07)
Basophils Absolute: 0 K/uL (ref 0.0–0.1)
Basophils Relative: 0 %
Eosinophils Absolute: 0.1 K/uL (ref 0.0–0.5)
Eosinophils Relative: 2 %
HCT: 39.7 % (ref 36.0–46.0)
Hemoglobin: 12.8 g/dL (ref 12.0–15.0)
Immature Granulocytes: 0 %
Lymphocytes Relative: 46 %
Lymphs Abs: 3.4 K/uL (ref 0.7–4.0)
MCH: 22.5 pg — ABNORMAL LOW (ref 26.0–34.0)
MCHC: 32.2 g/dL (ref 30.0–36.0)
MCV: 69.8 fL — ABNORMAL LOW (ref 80.0–100.0)
Monocytes Absolute: 0.6 K/uL (ref 0.1–1.0)
Monocytes Relative: 8 %
Neutro Abs: 3.3 K/uL (ref 1.7–7.7)
Neutrophils Relative %: 44 %
Platelets: 258 K/uL (ref 150–400)
RBC: 5.69 MIL/uL — ABNORMAL HIGH (ref 3.87–5.11)
RDW: 16.7 % — ABNORMAL HIGH (ref 11.5–15.5)
WBC: 7.5 K/uL (ref 4.0–10.5)
nRBC: 0 % (ref 0.0–0.2)

## 2024-02-09 MED ORDER — AMLODIPINE BESYLATE 10 MG PO TABS
10.0000 mg | ORAL_TABLET | Freq: Every day | ORAL | Status: DC
  Administered 2024-02-09 – 2024-02-12 (×4): 10 mg via ORAL
  Filled 2024-02-09 (×4): qty 1

## 2024-02-09 NOTE — Progress Notes (Signed)
 "                                                        PROGRESS NOTE   Subjective/Complaints:  No issues overnite , labs reviewed, discussed labs and BP meds with pt   ROS: +right sided hemiplegia. See HPI, denies CP, SOB, abd pain, n/v/d/c   Objective:   No results found. Recent Labs    02/09/24 0545  WBC 7.5  HGB 12.8  HCT 39.7  PLT 258   Recent Labs    02/07/24 0534 02/09/24 0545  NA 139 140  K 3.1* 3.6  CL 101 104  CO2 27 26  GLUCOSE 139* 142*  BUN 40* 36*  CREATININE 1.07* 1.03*  CALCIUM  9.1 9.2    Intake/Output Summary (Last 24 hours) at 02/09/2024 0931 Last data filed at 02/08/2024 1937 Gross per 24 hour  Intake 240 ml  Output --  Net 240 ml        Physical Exam: Vital Signs Blood pressure (!) 124/57, pulse 60, temperature 97.8 F (36.6 C), temperature source Oral, resp. rate 18, height 5' (1.524 m), weight 105.7 kg, SpO2 96%.   General: No acute distress Mood and affect are appropriate Heart: Regular rate and rhythm no rubs murmurs or extra sounds Lungs: Clear to auscultation, breathing unlabored, no rales or wheezes Abdomen: Positive bowel sounds, soft nontender to palpation, nondistended Extremities: No clubbing, cyanosis, or edema Skin: No evidence of breakdown, no evidence of rash  Neuro:  Eyes without evidence of nystagmus  Tone is normal without evidence of spasticity Cerebellar exam shows no evidence of ataxia on finger nose finger or heel to shin testing No evidence of trunkal ataxia  Motor strength is 5/5 in left and 0/5 Right deltoid, biceps, triceps, finger flexors and extensors, wrist flexors and extensors, hip flexors, knee flexors and extensors, ankle dorsiflexors, plantar flexors, invertors and evertors, toe flexors and extensors  Sensory exam is normal to light touch in the Right upper  Cranial nerves II- Visual fields are intact to confrontation testing, no blurring of vision III- no evidence of ptosis, upward, downward  and medial gaze intact IV- no vertical diplopia or head tilt V- + right hemifacial numbness VI- no pupil abduction weakness VII- mild right lower facial droop, good lid closure VII- normal auditory acuity IX- no pharygeal weakness, gag nl X- no pharyngeal weakness, no hoarseness XI- no trap or SCM weakness XII- no glossal weakness     Assessment/Plan: 1. Functional deficits which require 3+ hours per day of interdisciplinary therapy in a comprehensive inpatient rehab setting. Physiatrist is providing close team supervision and 24 hour management of active medical problems listed below. Physiatrist and rehab team continue to assess barriers to discharge/monitor patient progress toward functional and medical goals  Care Tool:  Bathing    Body parts bathed by patient: Right arm, Chest, Abdomen, Front perineal area, Buttocks, Left upper leg, Left lower leg, Face   Body parts bathed by helper: Left arm, Right lower leg, Right upper leg     Bathing assist Assist Level: Moderate Assistance - Patient 50 - 74%     Upper Body Dressing/Undressing Upper body dressing   What is the patient wearing?: Pull over shirt    Upper body assist Assist Level: Maximal Assistance - Patient 25 - 49%  Lower Body Dressing/Undressing Lower body dressing      What is the patient wearing?: Incontinence brief, Pants     Lower body assist Assist for lower body dressing: Total Assistance - Patient < 25%     Toileting Toileting    Toileting assist Assist for toileting: Maximal Assistance - Patient 25 - 49%     Transfers Chair/bed transfer  Transfers assist     Chair/bed transfer assist level: Maximal Assistance - Patient 25 - 49%     Locomotion Ambulation   Ambulation assist      Assist level: 2 helpers Assistive device: Other (comment) (L handrial) Max distance: 57ft   Walk 10 feet activity   Assist     Assist level: 2 helpers Assistive device: Other (comment) (L  handrail)   Walk 50 feet activity   Assist Walk 50 feet with 2 turns activity did not occur: Safety/medical concerns (weakness, fatigue)         Walk 150 feet activity   Assist Walk 150 feet activity did not occur: Safety/medical concerns (weakness, fatigue)         Walk 10 feet on uneven surface  activity   Assist Walk 10 feet on uneven surfaces activity did not occur: Safety/medical concerns (weakness, fatigue)         Wheelchair     Assist Is the patient using a wheelchair?: Yes Type of Wheelchair: Manual    Wheelchair assist level: Supervision/Verbal cueing Max wheelchair distance: 120ft    Wheelchair 50 feet with 2 turns activity    Assist        Assist Level: Supervision/Verbal cueing   Wheelchair 150 feet activity     Assist      Assist Level: Dependent - Patient 0%   Blood pressure (!) 124/57, pulse 60, temperature 97.8 F (36.6 C), temperature source Oral, resp. rate 18, height 5' (1.524 m), weight 105.7 kg, SpO2 96%.  Medical Problem List and Plan: 1. 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 Continue Asa and Plavix  with statin. Follow up with Dr. Chalice in 4 weeks.              -patient may  shower -ELOS/Goals: 14-18 days due to R hemiplegia- Min A hopefully, goals, mod I to supervision for SLP/dysarthria Chart and therapy notes reviewed, continue CIR, discussed patient's progress with therapy Grounds pass ordered Vitamin D3/Metanx/Vitamin B/C complex ordered F/u with Dr. JONELLE or Fidela in clinic within 1 month Would benefit from home health aide upon discharge    2.  Antithrombotics: continue Lovenox  40mg  daily; antiplatelet ASA and plavix  daily   3. Pain Management: Tylenol  prn    4. Mood/Behavior/Sleep: LCSW to follow for evaluation and support when available.              -antipsychotic agents: N/A  -trazodone  PRN   5. Neuropsych/cognition: This patient is  capable of making decisions on her own behalf.   6. Skin/Wound Care: Routine pressure relief measures.    7. FEN/AKI: encouraged oral hydration, continue vitamins/supplements, continue routine labs.     Latest Ref Rng & Units 02/09/2024    5:45 AM 02/07/2024    5:34 AM 02/06/2024    5:42 AM  BMP  Glucose 70 - 99 mg/dL 857  860  854   BUN 8 - 23 mg/dL 36  40  37   Creatinine 0.44 - 1.00 mg/dL 8.96  8.92  8.77   Sodium 135 - 145  mmol/L 140  139  141   Potassium 3.5 - 5.1 mmol/L 3.6  3.1  3.1   Chloride 98 - 111 mmol/L 104  101  100   CO2 22 - 32 mmol/L 26  27  30    Calcium  8.9 - 10.3 mg/dL 9.2  9.1  9.3    Cont to enc fluids, stopping chlorthalidone  should help    8. Screening for vitamin D  deficiency: D level 28.1 on 02/06/24, continue D3 1000U daily   9. HTN: BP reviewed and is normal, continue Amlodipine  5mg  daily, Chlorthalidone  25mg  daily, Avapro  37.5mg  daily, and Bystolic  10mg  daily.  Monitor BP with activity.   -BPs stable, monitor Vitals:   02/05/24 1624 02/05/24 2011 02/06/24 0600 02/06/24 1449  BP: 137/67 136/68 118/70 117/64   02/06/24 2009 02/07/24 0549 02/07/24 1434 02/07/24 2022  BP: 127/65 114/65 116/69 125/67   02/08/24 0548 02/08/24 1501 02/08/24 1932 02/09/24 0506  BP: (!) 120/58 109/72 122/70 (!) 124/57   BPs well controlled but will d/c chlorthalidone  due to low K+ requiring high dose supplementation , will increase amlodipine  to compensate  10. DM2: A1c 7.2, Home metformin  held, resumed Farxiga  10mg  daily. Monitor glucose ac/hs with SSI and meal time coverage.    -1/10-11/26 CBGs look good, monitor  CBG (last 3)  Recent Labs    02/08/24 1736 02/08/24 2135 02/09/24 0557  GLUCAP 130* 171* 138*   Controlled 1/12   11. HLD: Lipitor 80 mg daily    12. OSA: CPAP nightly    13. Left Thyroid Nodule: 2.2 cm thyroid nodule found on CT.  Follow up outpatient for non emergent thyroid US .    14. Class 4 Obesity:  BMI is 47.85 kg/m . Educate on diet and weight loss  to promote overall health and mobility.    15. Constipation- will order Miralax  daily that she's taking as well as Dulcolax suppository tonight to help her get back on schedule. - She also wants apple juice. LBM 1/7. Also on senokot s 2 tabs nightly.   -02/08/24 LBM yesterday smear, but 1/9 large; monitor  16. Hypokalemia: supplement 40meq BID 2 doses today, repeat BMP tomorrow -02/07/24 still no improvement, K 3.1, will order Klor 40mEq BID x3d, recheck Monday.  Now normal but requiring large doses of KCL will d/c chlorthalidone  and monitor Recheck serum K+ in am  LOS: 4 days A FACE TO FACE EVALUATION WAS PERFORMED  Prentice FORBES Compton 02/09/2024, 9:31 AM     "

## 2024-02-09 NOTE — Progress Notes (Signed)
" °   02/09/24 1432  Spiritual Encounters  Type of Visit Initial  Care provided to: Pt and family  Referral source Nurse (RN/NT/LPN)  Reason for visit Advance directives  OnCall Visit No  Spiritual Framework  Presenting Themes Impactful experiences and emotions  Community/Connection Family  Patient Stress Factors Health changes  Family Stress Factors Health changes  Interventions  Spiritual Care Interventions Made Compassionate presence;Established relationship of care and support  Intervention Outcomes  Outcomes Awareness of health;Awareness of support   Chaplain provided education regarding Advance Directive (AD). Pt's spouse was present, Pt shared they have been married 45 years and identified her husband as her tree surgeon.  "

## 2024-02-09 NOTE — Progress Notes (Signed)
 Physical Therapy Session Note  Patient Details  Name: Marisa [name] MRN: [id number] Date of Birth: May 01, 1960  Today's Date: 02/09/2024 PT Individual Time: 1418-1530 PT Individual Time Calculation (min): 72 min   Short Term Goals: Week 1:  PT Short Term Goal 1 (Week 1): pt will transfer supine<>sitting EOB with mod A consistantly PT Short Term Goal 2 (Week 1): pt will transfer sit<>stand with LRAD and min A PT Short Term Goal 3 (Week 1): pt will transfer bed<>chair with LRAD and min A  Skilled Therapeutic Interventions/Progress Updates:     Pt seated in WC upon arrival. States she just went to the bathroom with nursing. Pt denies pain and agreeable to therapy. Pt states her R shoulder is feeling better this afternoon. Session emphasized functional mobility, transfers, WC mobility, dynamic standing balance, coordination, NMR, and ambulation. Pt self-propelled WC from outside room to main gym using L UE/LE with supervision and VC to avoid hitting wall on R. Pt practiced squat pivot transfers WC <> mat with mod A overall and VC for hand/foot placement and head-hip ratio. Pt practiced sit to stand transfers WC <> L HR in hallway with min A progressing to CGA - pt practiced with and without L foot on 1-inch step to bias R LE. PT donned R DF ACE wrap. Pt amb 25 ft x 3 trials using L HR with R UE around therapist's shoulder. Pt required mod A via gait belt and max A to advance, place, and block R knee from buckling and hyperextending in stance. Once back in room, pt remained seated in WC, R UE supported on WC tray, seat belt alarm on, and all needs in reach at end of session.  Therapy Documentation Precautions:  Precautions Precautions: Fall Recall of Precautions/Restrictions: Intact Precaution/Restrictions Comments: R dense hemi Restrictions Weight Bearing Restrictions Per Provider Order: No  Therapy/Group: Individual Therapy  Comer CHRISTELLA Levora Comer Levora, PT, DPT 02/09/2024,  7:56 AM

## 2024-02-09 NOTE — Progress Notes (Signed)
 Speech Language Pathology Daily Session Note  Patient Details  Name: Marisa [name] MRN: [id number] Date of Birth: 1960/07/07  Today's Date: 02/09/2024 SLP Individual Time: 0800-0859 SLP Individual Time Calculation (min): 59 min  Short Term Goals: Week 1: SLP Short Term Goal 1 (Week 1): Patient will tolerate regular/thin liquid diet with supervision for use of oral clearance strategies. SLP Short Term Goal 2 (Week 1): Patient will attend to functional therapy tasks for 30 minutes with min assist. SLP Short Term Goal 3 (Week 1): Patient will recall daily events using external and internal memory aids with min assist. SLP Short Term Goal 4 (Week 1): Patient will solve mildly complex functional problems with min assist. SLP Short Term Goal 5 (Week 1): Patient will utilize speech intelligibility strategies at the sentence level with min assist.  Skilled Therapeutic Interventions: Upon entrance, patient in bed requesting transfer to BR. SLP and RN transferred patient to [facility] with steady per safety plan. Patient with continence of bowel and bladder. Patient then transferred to ST office to target cognitive goals. SLP facilitated session by prompting patient to complete BID pill box per verbal and written directions. Patient completed medication management task with modi and x1 mistakes corrected with minA. SLP continued to challenge patient through ID of mistakes and provide solutions. Patient required minA to locate mistakes in pillbox and correct. Patient left in [facility] with alarm set and call bell in reach. Continue POC.    Pain Denies   Therapy/Group: Individual Therapy  Titiana Severa M.A., CCC-SLP 02/09/2024, 7:40 AM

## 2024-02-09 NOTE — Progress Notes (Signed)
 Occupational Therapy Session Note  Patient Details  Name: [name] MRN: [id number] Date of Birth: 1960/10/01  Today's Date: 02/09/2024 OT Individual Time: 0950-1100 OT Individual Time Calculation (min): 70 min    Short Term Goals: Week 1:  OT Short Term Goal 1 (Week 1): Pt will complete toilet transfer with mod A OT Short Term Goal 2 (Week 1): Pt will complete LB dressing with mod A OT Short Term Goal 3 (Week 1): Pt will complete sit > stand with min A using LRAD  Skilled Therapeutic Interventions/Progress Updates:  Skilled OT intervention completed with focus on ADL retraining, functional endurance, and mobility within a shower context. Pt received in stedy with staff assisting with pericare after incontinent urinary void, agreeable to session. No pain reported.  Pt completed all sit > stands with CGA in stedy and transfers dependently in stedy for time and energy conservation. Mod verbal cues needed for R hemibody positioning throughout including hooking RLE and keeping RUE in safe place as pt with RUE neglect throughout.   Pt was able to bathe all parts with mod A for LUE and buttocks. Education provided on use of long handled sponge to access BLE and periareas. Pt initiated a stand in shower (bench facing perpendicular to grab bar) but OT advised against due to safety implications and dense R hemi which demos mild safety awareness deficits. Discussed how hole of the padded BSC can be helpful for accessing peri regions with sponge as alternative. Cues needed for accessing all parts and R hemi techniques for doing so. Dressed in perched position in stedy in front of mirror for visual feedback and proprioceptive benefits. Required max cues but able to donn shirt/deo with mod A. Threaded LB clothing with use of reacher following education, but overall max A to donn brief/pants in stedy. Dependent for socks/shoes.  Pt remained seated in w/c with 1/2 lap tray on RUE for hemi  positioning, with belt alarm on/activated, and with all needs in reach at end of session.   Therapy Documentation Precautions:  Precautions Precautions: Fall Recall of Precautions/Restrictions: Intact Precaution/Restrictions Comments: R dense hemi Restrictions Weight Bearing Restrictions Per Provider Order: No    Therapy/Group: Individual Therapy  [name], MS, OTR/L  02/09/2024, 12:14 PM

## 2024-02-10 ENCOUNTER — Inpatient Hospital Stay (HOSPITAL_COMMUNITY): Attending: Physical Medicine & Rehabilitation

## 2024-02-10 DIAGNOSIS — R609 Edema, unspecified: Secondary | ICD-10-CM | POA: Diagnosis not present

## 2024-02-10 DIAGNOSIS — E1169 Type 2 diabetes mellitus with other specified complication: Secondary | ICD-10-CM

## 2024-02-10 DIAGNOSIS — I10 Essential (primary) hypertension: Secondary | ICD-10-CM | POA: Diagnosis not present

## 2024-02-10 DIAGNOSIS — K5900 Constipation, unspecified: Secondary | ICD-10-CM | POA: Diagnosis not present

## 2024-02-10 DIAGNOSIS — E785 Hyperlipidemia, unspecified: Secondary | ICD-10-CM | POA: Diagnosis not present

## 2024-02-10 DIAGNOSIS — Z794 Long term (current) use of insulin: Secondary | ICD-10-CM | POA: Diagnosis not present

## 2024-02-10 DIAGNOSIS — I639 Cerebral infarction, unspecified: Secondary | ICD-10-CM | POA: Diagnosis not present

## 2024-02-10 DIAGNOSIS — E876 Hypokalemia: Secondary | ICD-10-CM | POA: Diagnosis not present

## 2024-02-10 LAB — GLUCOSE, CAPILLARY
Glucose-Capillary: 133 mg/dL — ABNORMAL HIGH (ref 70–99)
Glucose-Capillary: 118 mg/dL — ABNORMAL HIGH (ref 70–99)
Glucose-Capillary: 136 mg/dL — ABNORMAL HIGH (ref 70–99)
Glucose-Capillary: 158 mg/dL — ABNORMAL HIGH (ref 70–99)

## 2024-02-10 LAB — POTASSIUM: Potassium: 3.8 mmol/L (ref 3.5–5.1)

## 2024-02-10 NOTE — Progress Notes (Signed)
 Orthopedic Tech Progress Note Patient Details:  Marisa [name] 18-Mar-1960 [id number] Called in resting hand splint for patient to [facility].  Patient ID: Marisa [name], female   DOB: [birth year], 64 y.o.   MRN: [id number]  [name] December 02/10/2024, 1:12 PM

## 2024-02-10 NOTE — Progress Notes (Signed)
 Occupational Therapy Session Note  Patient Details  Name: Marisa [name] MRN: [id number] Date of Birth: 1960/08/13  Today's Date: 02/10/2024 OT Individual Time: 1050-1200 OT Individual Time Calculation (min): 70 min    Short Term Goals: Week 1:  OT Short Term Goal 1 (Week 1): Pt will complete toilet transfer with mod A OT Short Term Goal 2 (Week 1): Pt will complete LB dressing with mod A OT Short Term Goal 3 (Week 1): Pt will complete sit > stand with min A using LRAD  Skilled Therapeutic Interventions/Progress Updates:  Skilled OT intervention completed with focus on RUE NMR. Pt received seated in w/c, agreeable to session. No pain reported however observed palpable swollen region on R ulnar wrist but also not warm to the touch. MD/PA notified.  Decided to avoid wrist extension and application of k-tape or estim due to current situation on wrist, but would benefit from work in this area.  OT applied e-stim to RUE biceps (level 30) for NMR with elbow flexion on table top. Used maxi slide for decreased friction and efficiency with the motion. Pt required frequent cueing for obtaining upright trunk as well as visually attending to task. Pt with trace activation following e-stim on maxi slide. Transferred e-stim to R trap/deltoid (level 25) for scapular elevation with mirror utilized for visual feedback during shoulder shrug exercises. Education provided on the benefit of this for NMR. No skin irritation noted following stim removal.  Pt remained seated in w/c, with husband present and with all needs in reach at end of session.   Therapy Documentation Precautions:  Precautions Precautions: Fall Recall of Precautions/Restrictions: Intact Precaution/Restrictions Comments: R dense hemi Restrictions Weight Bearing Restrictions Per Provider Order: No    Therapy/Group: Individual Therapy  Marisa FORBES Fritter, MS, OTR/L  02/10/2024, 12:18 PM

## 2024-02-10 NOTE — Progress Notes (Signed)
 RUE venous duplex has been completed.   Results can be found under chart review under CV PROC. 02/10/2024 4:51 PM Ramello Cordial RVT, RDMS

## 2024-02-10 NOTE — Progress Notes (Addendum)
 "                                                        PROGRESS NOTE   Subjective/Complaints:  Working in her room with therapy today.  Patient asked about getting cholesterol rechecked, appears she had this checked fairly recently on 1/2.  No additional complaints or concerns.  ROS: +right sided hemiplegia. See HPI,  denies chills, CP, SOB, abd pain, n/v/d/c   Objective:   No results found. Recent Labs    02/09/24 0545  WBC 7.5  HGB 12.8  HCT 39.7  PLT 258   Recent Labs    02/09/24 0545 02/10/24 0450  NA 140  --   K 3.6 3.8  CL 104  --   CO2 26  --   GLUCOSE 142*  --   BUN 36*  --   CREATININE 1.03*  --   CALCIUM  9.2  --     Intake/Output Summary (Last 24 hours) at 02/10/2024 1421 Last data filed at 02/10/2024 0800 Gross per 24 hour  Intake 480 ml  Output --  Net 480 ml        Physical Exam: Vital Signs Blood pressure 117/65, pulse 70, temperature 97.9 F (36.6 C), temperature source Oral, resp. rate 19, height 5' (1.524 m), weight 105.7 kg, SpO2 99%.   General: No acute distress, working with therapy in her room Mood and affect are appropriate, pleasant Heart: Regular rate and rhythm no rubs murmurs or extra sounds Lungs: Clear to auscultation, breathing unlabored, no rales or wheezes Abdomen: Positive bowel sounds, soft nontender to palpation, nondistended Extremities: No clubbing, cyanosis, or edema Skin: No evidence of breakdown, no evidence of rash  Neuro: Moving left upper and lower extremity to gravity and resistance, alert and awake, follows simple commands.  Prior exam  Eyes without evidence of nystagmus  Tone is normal without evidence of spasticity Cerebellar exam shows no evidence of ataxia on finger nose finger or heel to shin testing No evidence of trunkal ataxia  Motor strength is 5/5 in left and 0/5 Right deltoid, biceps, triceps, finger flexors and extensors, wrist flexors and extensors, hip flexors, knee flexors and extensors,  ankle dorsiflexors, plantar flexors, invertors and evertors, toe flexors and extensors  Sensory exam is normal to light touch in the Right upper  Cranial nerves II- Visual fields are intact to confrontation testing, no blurring of vision III- no evidence of ptosis, upward, downward and medial gaze intact IV- no vertical diplopia or head tilt V- + right hemifacial numbness VI- no pupil abduction weakness VII- mild right lower facial droop, good lid closure VII- normal auditory acuity IX- no pharygeal weakness, gag nl X- no pharyngeal weakness, no hoarseness XI- no trap or SCM weakness XII- no glossal weakness     Assessment/Plan: 1. Functional deficits which require 3+ hours per day of interdisciplinary therapy in a comprehensive inpatient rehab setting. Physiatrist is providing close team supervision and 24 hour management of active medical problems listed below. Physiatrist and rehab team continue to assess barriers to discharge/monitor patient progress toward functional and medical goals  Care Tool:  Bathing    Body parts bathed by patient: Right arm, Chest, Abdomen, Front perineal area, Left upper leg, Face, Right upper leg   Body parts bathed by helper: Left arm, Right lower  leg, Right upper leg     Bathing assist Assist Level: Minimal Assistance - Patient > 75%     Upper Body Dressing/Undressing Upper body dressing   What is the patient wearing?: Pull over shirt    Upper body assist Assist Level: Moderate Assistance - Patient 50 - 74%    Lower Body Dressing/Undressing Lower body dressing      What is the patient wearing?: Incontinence brief, Pants     Lower body assist Assist for lower body dressing: Maximal Assistance - Patient 25 - 49%     Toileting Toileting    Toileting assist Assist for toileting: Maximal Assistance - Patient 25 - 49%     Transfers Chair/bed transfer  Transfers assist     Chair/bed transfer assist level: Moderate Assistance -  Patient 50 - 74%     Locomotion Ambulation   Ambulation assist      Assist level: 2 helpers Assistive device: Other (comment) (L handrial) Max distance: 5ft   Walk 10 feet activity   Assist     Assist level: 2 helpers Assistive device: Other (comment) (L handrail)   Walk 50 feet activity   Assist Walk 50 feet with 2 turns activity did not occur: Safety/medical concerns (weakness, fatigue)         Walk 150 feet activity   Assist Walk 150 feet activity did not occur: Safety/medical concerns (weakness, fatigue)         Walk 10 feet on uneven surface  activity   Assist Walk 10 feet on uneven surfaces activity did not occur: Safety/medical concerns (weakness, fatigue)         Wheelchair     Assist Is the patient using a wheelchair?: Yes Type of Wheelchair: Manual    Wheelchair assist level: Supervision/Verbal cueing Max wheelchair distance: 137ft    Wheelchair 50 feet with 2 turns activity    Assist        Assist Level: Supervision/Verbal cueing   Wheelchair 150 feet activity     Assist      Assist Level: Dependent - Patient 0%   Blood pressure 117/65, pulse 70, temperature 97.9 F (36.6 C), temperature source Oral, resp. rate 19, height 5' (1.524 m), weight 105.7 kg, SpO2 99%.  Medical Problem List and Plan: 1. 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 Continue Asa and Plavix  with statin. Follow up with Dr. Chalice in 4 weeks.              -patient may  shower -ELOS/Goals: 14-18 days due to R hemiplegia- Min A hopefully, goals, mod I to supervision for SLP/dysarthria Chart and therapy notes reviewed, continue CIR, discussed patient's progress with therapy Grounds pass ordered Vitamin D3/Metanx/Vitamin B/C complex ordered F/u with Dr. JONELLE or Fidela in clinic within 1 month Would benefit from home health aide upon discharge  -Team conference tomorrow   2.   Antithrombotics: continue Lovenox  40mg  daily; antiplatelet ASA and plavix  daily   3. Pain Management: Tylenol  prn    4. Mood/Behavior/Sleep: LCSW to follow for evaluation and support when available.              -antipsychotic agents: N/A  -trazodone  PRN   5. Neuropsych/cognition: This patient is capable of making decisions on her own behalf.   6. Skin/Wound Care: Routine pressure relief measures.    7. FEN/AKI: encouraged oral hydration, continue vitamins/supplements, continue routine labs.     Latest Ref Rng & Units 02/10/2024  4:50 AM 02/09/2024    5:45 AM 02/07/2024    5:34 AM  BMP  Glucose 70 - 99 mg/dL  857  860   BUN 8 - 23 mg/dL  36  40   Creatinine 9.55 - 1.00 mg/dL  8.96  8.92   Sodium 864 - 145 mmol/L  140  139   Potassium 3.5 - 5.1 mmol/L 3.8  3.6  3.1   Chloride 98 - 111 mmol/L  104  101   CO2 22 - 32 mmol/L  26  27   Calcium  8.9 - 10.3 mg/dL  9.2  9.1    Cont to enc fluids, stopping chlorthalidone  should help  -1/13 potassium was checked today, 3.8   8. Screening for vitamin D  deficiency: D level 28.1 on 02/06/24, continue D3 1000U daily   9. HTN: BP reviewed and is normal, continue Amlodipine  5mg  daily, Chlorthalidone  25mg  daily, Avapro  37.5mg  daily, and Bystolic  10mg  daily.  Monitor BP with activity.   -BPs stable, monitor Vitals:   02/06/24 2009 02/07/24 0549 02/07/24 1434 02/07/24 2022  BP: 127/65 114/65 116/69 125/67   02/08/24 0548 02/08/24 1501 02/08/24 1932 02/09/24 0506  BP: (!) 120/58 109/72 122/70 (!) 124/57   02/09/24 1702 02/09/24 2021 02/10/24 0440 02/10/24 1349  BP: 123/72 (!) 143/68 126/65 117/65   BPs well controlled but will d/c chlorthalidone  due to low K+ requiring high dose supplementation , will increase amlodipine  to compensate 1/13 overall stable continue current regimen and monitor  10. DM2: A1c 7.2, Home metformin  held, resumed Farxiga  10mg  daily. Monitor glucose ac/hs with SSI and meal time coverage.    -1/10-11/26 CBGs look good,  monitor  CBG (last 3)  Recent Labs    02/09/24 2121 02/10/24 0548 02/10/24 1215  GLUCAP 138* 133* 118*   Controlled 1/12-13   11. HLD: Lipitor 80 mg daily  -1/13 patient asking about repeat lipid panel.  She had 1 earlier this month and currently on Lipitor 80 mg.  May be of little utility and may be better to wait till outpatient recheck.  However if felt beneficial primary team could consider adding it Thursday a.m. labs.   12. OSA: CPAP nightly    13. Left Thyroid Nodule: 2.2 cm thyroid nodule found on CT.  Follow up outpatient for non emergent thyroid US .    14. Class 4 Obesity:  BMI is 47.85 kg/m . Educate on diet and weight loss to promote overall health and mobility.    15. Constipation- will order Miralax  daily that she's taking as well as Dulcolax suppository tonight to help her get back on schedule. - She also wants apple juice. LBM 1/7. Also on senokot s 2 tabs nightly.   -1/13 LBM yesterday continue to monitor  16. Hypokalemia: supplement 40meq BID 2 doses today, repeat BMP tomorrow -02/07/24 still no improvement, K 3.1, will order Klor 40mEq BID x3d, recheck Monday.  Now normal but requiring large doses of KCL will d/c chlorthalidone  and monitor Recheck serum K+ in am - 3.8 on 1/13  Addendum: therapy reporting swelling R hand compared to left hand and some swelling in wrist, Vas US  ordered  LOS: 5 days A FACE TO FACE EVALUATION WAS PERFORMED  Murray Collier 02/10/2024, 2:21 PM     "

## 2024-02-10 NOTE — Progress Notes (Signed)
 Speech Language Pathology Daily Session Note  Patient Details  Name: Marisa [name] MRN: [id number] Date of Birth: 1960/08/29  Today's Date: 02/10/2024 SLP Individual Time: 1345-1415 SLP Individual Time Calculation (min): 30 min SLP Missed Time: 15 min SLP Missed Time Reason: Toileting;Nursing Care  Short Term Goals: Week 1: SLP Short Term Goal 1 (Week 1): Patient will tolerate regular/thin liquid diet with supervision for use of oral clearance strategies. SLP Short Term Goal 2 (Week 1): Patient will attend to functional therapy tasks for 30 minutes with min assist. SLP Short Term Goal 3 (Week 1): Patient will recall daily events using external and internal memory aids with min assist. SLP Short Term Goal 4 (Week 1): Patient will solve mildly complex functional problems with min assist. SLP Short Term Goal 5 (Week 1): Patient will utilize speech intelligibility strategies at the sentence level with min assist.  Skilled Therapeutic Interventions: SLP conducted skilled therapy session targeting cognitive and communication goals. Upon SLP initial entry, patient undergoing toileting/nursing care with NT. Missed 15 minutes skilled ST Time. Upon return, SLP facilitated verbally presented time-based problem solving questions where patient benefited from min to mod cues for working memory and calculation accuracy. SLP then presented patient with SLAP speech intelligibility strategies. Provided patient with visual aid to promote carryover and ongoing use of strategies. SLP then facilitated sentence level task where patient benefited from min to mod cues for consistent strategy implementation to achieve 80% intelligibility. Patient was left in room with call bell in reach and alarm set. SLP will continue to target goals per plan of care.        Pain  None  Therapy/Group: Individual Therapy  Emelin Dascenzo, M.A., CCC-SLP  Lanee Chain A Chrisandra Wiemers 02/10/2024, 3:14 PM

## 2024-02-10 NOTE — Progress Notes (Signed)
" °   02/10/24 2342  BiPAP/CPAP/SIPAP  BiPAP/CPAP/SIPAP Pt Type Adult  BiPAP/CPAP/SIPAP Resmed  Mask Type Full face mask  Patient Home Machine No  Patient Home Mask No  Patient Home Tubing No  Auto Titrate Yes    Pt placed on CPAP. "

## 2024-02-10 NOTE — Progress Notes (Signed)
 Physical Therapy Session Note  Patient Details  Name: Marisa [name] MRN: [id number] Date of Birth: 1960-01-30  Today's Date: 02/10/2024 PT Individual Time: [phone] and 1415-1455 PT Individual Time Calculation (min): 42 min and 40 min  Short Term Goals: Week 1:  PT Short Term Goal 1 (Week 1): pt will transfer supine<>sitting EOB with mod A consistantly PT Short Term Goal 2 (Week 1): pt will transfer sit<>stand with LRAD and min A PT Short Term Goal 3 (Week 1): pt will transfer bed<>chair with LRAD and min A  Skilled Therapeutic Interventions/Progress Updates:   Treatment Session 1 Received pt semi-reclined in bed, pt agreeable to PT treatment, and denied any pain during session. Session with emphasis on functional mobility/transfers, dressing, generalized strengthening and endurance, dynamic standing balance/coordination, NMR. Pt transferred semi-reclined<>sitting R EOB with HOB elevated and use of bedrails with light mod A for RUE/RLE management. Removed gown and donned clean pull over shirt with mod A to thread RUE through. Donned socks, pants, and shoes sitting EOB with max A for time management purposes and pt reported brief was soiled. Stood in [place] with min A and removed dirty brief, performed hygiene management, and donned clean brief with max A. Pt required mod A to pull pants over hips on R side (pt able to manage clothing on L side without assist). Performed squat<>pivot into WC to L with mod A and MD arrived for morning rounds. Pt sat in WC at sink and brushed teeth/washed face with setup assist. Provided pt with handout of healthy foods for weight loss, hypertension, anxiety, pain, and diabetes. Located R PLS AFO to trial in PM session. Concluded session with pt sitting in [facility] with all needs within reach awaiting upcoming OT session.   Treatment Session 2 Received pt sitting in WC, pt agreeable to PT treatment, and denied any pain. Session with emphasis on functional  mobility/transfers, generalized strengthening and endurance, and dynamic standing balance/NMR. Attempted to don R PLS AFO and tried 2 different braces, but both too big (need to contact [employer] for size small demo brace). Pt transported to/from dayroom in [facility] dependently for time management purposes. Pt performed seated BLE strengthening on Kinetron at 20 cm/sec for 1 minute progressing to 15 cm/sec for 1 minute x 3 trials with emphasis on glute/quad strength - cues for anterior weight shifting to engage core. Stood with L handrail and CGA and worked on standing mini squats 4x10 with LUE support and min A with emphasis on quad strength, knee extension, and weight shifting to R. Therapist lightly guarding R knee but no buckling noted and only 1 instance R knee hyperextension. Performed WC mobility 161ft using R hemi technique and min A back to room (cushion caught on wheel causing it to veer R). Concluded session with pt sitting in WC, needs within reach, and seatbelt alarm on.    Therapy Documentation Precautions:  Precautions Precautions: Fall Recall of Precautions/Restrictions: Intact Precaution/Restrictions Comments: R dense hemi Restrictions Weight Bearing Restrictions Per Provider Order: No  Therapy/Group: Individual Therapy Therisa HERO Zaunegger Therisa Stains PT, DPT 02/10/2024, 6:55 AM

## 2024-02-11 LAB — GLUCOSE, CAPILLARY
Glucose-Capillary: 119 mg/dL — ABNORMAL HIGH (ref 70–99)
Glucose-Capillary: 125 mg/dL — ABNORMAL HIGH (ref 70–99)
Glucose-Capillary: 146 mg/dL — ABNORMAL HIGH (ref 70–99)
Glucose-Capillary: 144 mg/dL — ABNORMAL HIGH (ref 70–99)

## 2024-02-11 MED ORDER — VITAMIN D 25 MCG (1000 UNIT) PO TABS
2000.0000 [IU] | ORAL_TABLET | Freq: Every day | ORAL | Status: DC
  Administered 2024-02-12 – 2024-02-17 (×6): 2000 [IU] via ORAL
  Filled 2024-02-11 (×6): qty 2

## 2024-02-11 MED ORDER — MAGNESIUM OXIDE -MG SUPPLEMENT 400 (240 MG) MG PO TABS
200.0000 mg | ORAL_TABLET | Freq: Every day | ORAL | Status: DC
  Administered 2024-02-11 – 2024-02-18 (×8): 200 mg via ORAL
  Filled 2024-02-11 (×8): qty 1

## 2024-02-11 NOTE — Plan of Care (Signed)
" °  Problem: Consults Goal: Nutrition Consult-if indicated Outcome: Progressing   Problem: RH BOWEL ELIMINATION Goal: RH STG MANAGE BOWEL WITH ASSISTANCE Description: STG Manage Bowel with mod I Assistance. Outcome: Progressing Goal: RH STG MANAGE BOWEL W/MEDICATION W/ASSISTANCE Description: STG Manage Bowel with Medication with mod I Assistance. Outcome: Progressing   "

## 2024-02-11 NOTE — Patient Care Conference (Addendum)
 Inpatient RehabilitationTeam Conference and Plan of Care Update Date: 02/11/2024   Time: 11:35 AM   Patient was admitted after the interdisciplinary team meeting took place on 02/05/2024; as a result, the first team conference occurred on day 8 of this patient's stay.     Patient Name: Marisa [name]      Medical Record Number: [id number]  Date of Birth: [birth year] Sex: Female         Room/Bed: 4W20C/4W20C-01 Payor Info: Payor: [employer] / Plan: [employer] / Product Type: *No Product type* /    Admit Date/Time:  02/05/2024  3:46 PM  Primary Diagnosis:  Left basal ganglia embolic stroke [facility])  Hospital Problems: Principal Problem:   Left basal ganglia embolic stroke [facility]) Active Problems:   CVA (cerebral vascular accident) [facility])    Expected Discharge Date: Expected Discharge Date: 02/26/24  Team Members Present: Physician leading conference: Dr. Sven Elks Social Worker Present: Waverly Gentry, LCSW-A Nurse Present: Barnie Ronde, RN PT Present: Therisa Stains, PT OT Present: Lorrayne Fritter, OT SLP Present: Rosina Downy, SLP     Current Status/Progress Goal Weekly Team Focus  Bowel/Bladder   Continent of B/B with intermittent incontinence LBM1/13   Regain and maintain continence of B/B   Timed toileting, assist with toileting as needed    Swallow/Nutrition/ Hydration   regular/thin liquid diet   modI       ADL's   Mod A UB, Max A LB, Max A toileting   Min A ADLs, CGA transfers   RUE NMR and awareness, dynamic standing balance, functional endurance    Mobility   bed mobility mod A, squat<>pivots mod/max A, sit<>stands with L handrail min A, gait 81ft with L handrail max A (+2)   supervision, CGA gait, min A steps  barriers: R hemiparesis, decreased balance/coordination, motor planning/sequencing    Communication   mild dysarthria   supervision   use of speech intelligibility strategies at the conversation level    Safety/Cognition/ Behavioral Observations   min to mod cognitive deficits in the areas of problem solving, recall of daily events, sustained attention, working memory, and awareness of deficits   supervision-min assist   mildly complex problem solving, recall of daily events and use of speech intelligibility strategies, awareness of errors and mistakes    Pain   Denies pain   Remain pain free   Assess and address pain every shift and prn    Skin   Skin intact   Maintain skin integrity  Assess akin every shift and as needed      Discharge Planning:  Will discharge home with spouse, and children providing 24/7 care and support. Will await therapy follow-up recommendations.   Team Discussion: Patient admitted post left basal ganglia CVA with dense right hemiparesis and cognitive deficits with poor endurance.  Patient on target to meet rehab goals: yes, currently needs mod assist for bed mobility. Needs mod assist for squat pivot and stand pivot transfes. Ambulates up to 25' with +2 max assist using the lite gait with left railing.  Needs mod assist for upper body care and max assist for lower body care and toileting. Needs min /mod assist for cognitive deficits; problem solving, memory and speech intelligibility. Goals for discharge set for supervision - min assist overall.  *See Care Plan and progress notes for long and short-term goals.   Revisions to Treatment Plan:  AFO Lite gait   Teaching Needs: Safety, medications, transfers, toileting, dietary modifications, etc.   Current Barriers to Discharge: Decreased  caregiver support  Possible Resolutions to Barriers: Family education OP follow up services     Medical Summary Current Status: class 3 obesity, bradycardia, HTN, CVA, HLD, constipation  Barriers to Discharge: Medical stability  Barriers to Discharge Comments: class 3 obesity, bradycardia, HTN, CVA, HLD, constipation Possible Resolutions to [employer] Focus: provide dietary education, continue to  monitor HR TID, continu amlodipine  10mg  daily, continue aspirin , statin, continue Bystolic , continue Avapro , continue miralax , continue senna-docusate   Continued Need for Acute Rehabilitation Level of Care: The patient requires daily medical management by a physician with specialized training in physical medicine and rehabilitation for the following reasons: Direction of a multidisciplinary physical rehabilitation program to maximize functional independence : Yes Medical management of patient stability for increased activity during participation in an intensive rehabilitation regime.: Yes Analysis of laboratory values and/or radiology reports with any subsequent need for medication adjustment and/or medical intervention. : Yes   I attest that I was present, lead the team conference, and concur with the assessment and plan of the team.   Fredericka Sober B 02/11/2024, 3:04 PM

## 2024-02-11 NOTE — Progress Notes (Signed)
 Speech Language Pathology Daily Session Note  Patient Details  Name: Marisa [name] MRN: [id number] Date of Birth: 05/22/1960  Today's Date: 02/11/2024 SLP Individual Time: 1400-1500 SLP Individual Time Calculation (min): 60 min  Short Term Goals: Week 1: SLP Short Term Goal 1 (Week 1): Patient will tolerate regular/thin liquid diet with supervision for use of oral clearance strategies. SLP Short Term Goal 2 (Week 1): Patient will attend to functional therapy tasks for 30 minutes with min assist. SLP Short Term Goal 3 (Week 1): Patient will recall daily events using external and internal memory aids with min assist. SLP Short Term Goal 4 (Week 1): Patient will solve mildly complex functional problems with min assist. SLP Short Term Goal 5 (Week 1): Patient will utilize speech intelligibility strategies at the sentence level with min assist.  Skilled Therapeutic Interventions: SLP conducted skilled therapy session targeting cognition goals. Patient recalled events from earlier therapy sessions with supervision-min assist. SLP facilitated 4-6 step sequencing task where she benefited from supervision assist to place 4 items in correct task order and up to min assist for 6-component task ordering. SLP then facilitated mildly complex scanning/working memory task where patient benefited from supervision-min assist for task organization and execution as well as ++processing time to locate targets. Patient was left in room with call bell in reach and alarm set. SLP will continue to target goals per plan of care.        Pain Pain Assessment Pain Scale: 0-10 Pain Score: 0-No pain  Therapy/Group: Individual Therapy  Yezenia Fredrick, M.A., CCC-SLP  Deklin Bieler A Alexiah Koroma 02/11/2024, 3:11 PM

## 2024-02-11 NOTE — Progress Notes (Signed)
 Occupational Therapy Session Note  Patient Details  Name: Marisa [name] MRN: [id number] Date of Birth: February 24, 1960  Today's Date: 02/11/2024 OT Individual Time: 1335-1400 OT Individual Time Calculation (min): 25 min    Short Term Goals: Week 1:  OT Short Term Goal 1 (Week 1): Pt will complete toilet transfer with mod A OT Short Term Goal 2 (Week 1): Pt will complete LB dressing with mod A OT Short Term Goal 3 (Week 1): Pt will complete sit > stand with min A using LRAD  Skilled Therapeutic Interventions/Progress Updates:  Skilled OT intervention completed with focus on RUE NMR. Pt received seated in w/c, agreeable to session. No pain reported.  Transported dependently in w/c <> gym. Seated at table top, pt participated in grasp and release task using e-stim (zynex) applied to R wrist extensors (level 24). Assist provided at elbow and forearm with trace activation in scapular protraction noted during reach. Still absent with retraction but pt excited about little improvements. Pt needed cues for visualization. E-stim and removed without adverse skin reaction.  Pt remained seated in w/c, with chair alarm on/activated, and with all needs in reach at end of session.   Therapy Documentation Precautions:  Precautions Precautions: Fall Recall of Precautions/Restrictions: Intact Precaution/Restrictions Comments: R dense hemi Restrictions Weight Bearing Restrictions Per Provider Order: No    Therapy/Group: Individual Therapy  [name], MS, OTR/L  02/11/2024, 2:05 PM

## 2024-02-11 NOTE — Progress Notes (Signed)
 Occupational Therapy Session Note  Patient Details  Name: Marisa [name] MRN: [id number] Date of Birth: 11/09/1960  Today's Date: 02/11/2024 OT Individual Time: 1115-1200 OT Individual Time Calculation (min): 45 min    Short Term Goals: Week 1:  OT Short Term Goal 1 (Week 1): Pt will complete toilet transfer with mod A OT Short Term Goal 2 (Week 1): Pt will complete LB dressing with mod A OT Short Term Goal 3 (Week 1): Pt will complete sit > stand with min A using LRAD Week 2:     Skilled Therapeutic Interventions/Progress Updates:    1:1 NMR with focus on UE as well as full body transitional movement. Received in w/c and taken to the dayroom at the high low table; focusing on sit to stands with and without UE support with activation in right LE. Then progressed to being able to step forward backwards with left foot with multimodal cues to activate right LE with fading cues. Pt was able to know when to activate and execute activation without cues. Also performed functional reach into right field to promote visual attention to left and weight bearing through right LE. In standing position focus on scapula and trunk work with ACTIVATION in the shoulder!! Pt able to retract and protract scapular as well as elevate.   Transitioned to squat pivot on to the mat with mod  A and then transitioning into sidelying with min A. Continued work with right UE shoulder work in sidelying and  in supine with weight of UE supported. Pt transitioned back into sitting with min A and then mod A squat pivot back into w/c. Pt left sitting up in prep for lunch.   Therapy Documentation Precautions:  Precautions Precautions: Fall Recall of Precautions/Restrictions: Intact Precaution/Restrictions Comments: R dense hemi Restrictions Weight Bearing Restrictions Per Provider Order: No  Pain: Pain Assessment Pain Scale: 0-10 Pain Score: 0-No pain    Therapy/Group: Individual Therapy  Claudene Nest  [facility] 02/11/2024, 3:22 PM

## 2024-02-11 NOTE — Progress Notes (Signed)
 "                                                        PROGRESS NOTE   Subjective/Complaints: No new complaints this morning Team conference held today, extended stay given patient's continued dense hemiplegia Placed on CPAP last night  ROS: +right sided hemiplegia. See HPI,  denies chills, CP, SOB, abd pain, n/v/d/c   Objective:   VAS US  UPPER EXTREMITY VENOUS DUPLEX Result Date: 02/10/2024 UPPER VENOUS STUDY  Patient Name:  Marisa [name]  Date of Exam:   02/10/2024 Medical Rec #: [id number]             Accession #:    [id number] Date of Birth: August 02, 1960            Patient Gender: F Patient Age:   64 years Exam Location:  [facility] Procedure:      VAS US  UPPER EXTREMITY VENOUS DUPLEX Referring Phys: MURRAY COLLIER --------------------------------------------------------------------------------  Indications: Edema Other Indications: Recent stroke with right side deficit. Limitations: Poor ultrasound/tissue interface and patient immobility. Comparison Study: No previous exams Performing Technologist: Jody Hill RVT, RDMS  Examination Guidelines: A complete evaluation includes B-mode imaging, spectral Doppler, color Doppler, and power Doppler as needed of all accessible portions of each vessel. Bilateral testing is considered an integral part of a complete examination. Limited examinations for reoccurring indications may be performed as noted.  Right Findings: +----------+------------+---------+-----------+----------+---------------------+ RIGHT     CompressiblePhasicitySpontaneousProperties       Summary        +----------+------------+---------+-----------+----------+---------------------+ IJV           Full       Yes       Yes                                    +----------+------------+---------+-----------+----------+---------------------+ Subclavian    Full       Yes       Yes                                     +----------+------------+---------+-----------+----------+---------------------+ Axillary      Full       Yes       Yes                                    +----------+------------+---------+-----------+----------+---------------------+ Brachial      Full       Yes       Yes                only one brachial                                                            visualized       +----------+------------+---------+-----------+----------+---------------------+ Radial        Full                                                        +----------+------------+---------+-----------+----------+---------------------+  Ulnar                    Yes       Yes                    patent by                                                               color/doppler     +----------+------------+---------+-----------+----------+---------------------+ Cephalic      Full                                                        +----------+------------+---------+-----------+----------+---------------------+ Basilic       Full       Yes       Yes                                    +----------+------------+---------+-----------+----------+---------------------+  Left Findings: +----------+------------+---------+-----------+----------+-------+ LEFT      CompressiblePhasicitySpontaneousPropertiesSummary +----------+------------+---------+-----------+----------+-------+ Subclavian    Full       Yes       Yes                      +----------+------------+---------+-----------+----------+-------+  Summary:  Right: No evidence of deep vein thrombosis in the upper extremity. No evidence of superficial vein thrombosis in the upper extremity.  Left: No evidence of thrombosis in the subclavian.  *See table(s) above for measurements and observations.  Diagnosing physician: Norman Serve Electronically signed by Norman Serve on 02/10/2024 at 5:56:37 PM.    Final    Recent Labs     02/09/24 0545  WBC 7.5  HGB 12.8  HCT 39.7  PLT 258   Recent Labs    02/09/24 0545 02/10/24 0450  NA 140  --   K 3.6 3.8  CL 104  --   CO2 26  --   GLUCOSE 142*  --   BUN 36*  --   CREATININE 1.03*  --   CALCIUM  9.2  --     Intake/Output Summary (Last 24 hours) at 02/11/2024 1346 Last data filed at 02/11/2024 1236 Gross per 24 hour  Intake 1020 ml  Output --  Net 1020 ml        Physical Exam: Vital Signs Blood pressure 126/67, pulse (!) 58, temperature 98.1 F (36.7 C), temperature source Oral, resp. rate 18, height 5' (1.524 m), weight 105.7 kg, SpO2 96%.  Gen: no distress, normal appearing HEENT: oral mucosa pink and moist, NCAT Cardio: Reg rate Chest: normal effort, normal rate of breathing Abd: soft, non-distended Ext: no edema Psych: pleasant, normal affect Skin: intact Neuro: Alert and oriented x3, 5/5 strength on left side, 0/5 strength throughout right side, sensation is intact   Assessment/Plan: 1. Functional deficits which require 3+ hours per day of interdisciplinary therapy in a comprehensive inpatient rehab setting. Physiatrist is providing close team supervision and 24 hour management of active medical problems listed below. Physiatrist and rehab team continue to assess barriers to discharge/monitor  patient progress toward functional and medical goals  Care Tool:  Bathing    Body parts bathed by patient: Right arm, Chest, Abdomen, Front perineal area, Left upper leg, Face, Right upper leg   Body parts bathed by helper: Left arm, Right lower leg, Right upper leg     Bathing assist Assist Level: Minimal Assistance - Patient > 75%     Upper Body Dressing/Undressing Upper body dressing   What is the patient wearing?: Pull over shirt    Upper body assist Assist Level: Moderate Assistance - Patient 50 - 74%    Lower Body Dressing/Undressing Lower body dressing      What is the patient wearing?: Incontinence brief, Pants     Lower  body assist Assist for lower body dressing: Maximal Assistance - Patient 25 - 49%     Toileting Toileting    Toileting assist Assist for toileting: Maximal Assistance - Patient 25 - 49%     Transfers Chair/bed transfer  Transfers assist     Chair/bed transfer assist level: Moderate Assistance - Patient 50 - 74%     Locomotion Ambulation   Ambulation assist      Assist level: 2 helpers Assistive device: Other (comment) (L handrial) Max distance: 83ft   Walk 10 feet activity   Assist     Assist level: 2 helpers Assistive device: Other (comment) (L handrail)   Walk 50 feet activity   Assist Walk 50 feet with 2 turns activity did not occur: Safety/medical concerns (weakness, fatigue)         Walk 150 feet activity   Assist Walk 150 feet activity did not occur: Safety/medical concerns (weakness, fatigue)         Walk 10 feet on uneven surface  activity   Assist Walk 10 feet on uneven surfaces activity did not occur: Safety/medical concerns (weakness, fatigue)         Wheelchair     Assist Is the patient using a wheelchair?: Yes Type of Wheelchair: Manual    Wheelchair assist level: Supervision/Verbal cueing Max wheelchair distance: 146ft    Wheelchair 50 feet with 2 turns activity    Assist        Assist Level: Supervision/Verbal cueing   Wheelchair 150 feet activity     Assist      Assist Level: Dependent - Patient 0%   Blood pressure 126/67, pulse (!) 58, temperature 98.1 F (36.7 C), temperature source Oral, resp. rate 18, height 5' (1.524 m), weight 105.7 kg, SpO2 96%.  Medical Problem List and Plan: 1. 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 Continue Asa and Plavix  with statin. Follow up with Dr. Chalice in 4 weeks.              -patient may  shower -ELOS/Goals: 14-18 days due to R hemiplegia- Min A hopefully, goals, mod I to supervision for  SLP/dysarthria Chart and therapy notes reviewed, continue CIR, discussed patient's progress with therapy Grounds pass ordered Vitamin D3/Metanx/Vitamin B/C complex ordered F/u with Dr. JONELLE or Fidela in clinic within 1 month Would benefit from home health aide upon discharge  Continue CIR   2.  Antithrombotics: continue Lovenox  40mg  daily; antiplatelet ASA and plavix  daily   3. Pain Management: Tylenol  prn    4. Mood/Behavior/Sleep: LCSW to follow for evaluation and support when available.              -antipsychotic agents: N/A  -trazodone  PRN   5.  Neuropsych/cognition: This patient is capable of making decisions on her own behalf.   6. Skin/Wound Care: Routine pressure relief measures.    7. FEN/AKI: encouraged oral hydration, continue vitamins/supplements, continue routine labs.    8. Screening for vitamin D  deficiency: D level 28.1 on 02/06/24, increase D3 to 2,000U daily   9. HTN: BP reviewed and is normal, continue Amlodipine  5mg  daily, d/c chorthalidone, continue Avapro  37.5mg  daily, and Bystolic  10mg  daily.  Monitor BP with activity.    10. DM2: A1c 7.2, Home metformin  held, resumed Farxiga  10mg  daily. Monitor glucose ac/hs with SSI and meal time coverage.    CBG (last 3)  Recent Labs    02/10/24 2105 02/11/24 0536 02/11/24 1125  GLUCAP 158* 119* 125*   Controlled 1/12-13   11. HLD: Lipitor 80 mg daily, Lipid panel ordered Thursday morning as per patient's request   12. OSA: CPAP nightly    13. Left Thyroid Nodule: 2.2 cm thyroid nodule found on CT.  Follow up outpatient for non emergent thyroid US .    14. Class 4 Obesity:  BMI is 47.85 kg/m . Educate on diet and weight loss to promote overall health and mobility.    15. Constipation- will order Miralax  daily that she's taking as well as Dulcolax suppository tonight to help her get back on schedule. - She also wants apple juice. Mag oxide added HS. Also on senokot s 2 tabs nightly.   16. Hypokalemia: repeat BMP  tomorrow  17. Right hand swelling: US  reviewed and is negative for clot  LOS: 6 days A FACE TO FACE EVALUATION WAS PERFORMED  Macarius Ruark P Chelsae Zanella 02/11/2024, 1:46 PM     "

## 2024-02-11 NOTE — Progress Notes (Signed)
 Physical Therapy Session Note  Patient Details  Name: Marisa [name] MRN: [id number] Date of Birth: 08/11/1960  Today's Date: 02/11/2024 PT Individual Time: 0930-1041 PT Individual Time Calculation (min): 71 min   Short Term Goals: Week 1:  PT Short Term Goal 1 (Week 1): pt will transfer supine<>sitting EOB with mod A consistantly PT Short Term Goal 2 (Week 1): pt will transfer sit<>stand with LRAD and min A PT Short Term Goal 3 (Week 1): pt will transfer bed<>chair with LRAD and min A  Skilled Therapeutic Interventions/Progress Updates:   Received pt sitting in [facility] with husband at bedside. Pt agreeable to PT treatment and denied any pain during session. Session with emphasis on functional mobility/transfers, generalized strengthening and endurance, dynamic standing balance/coordination, NMR, and gait training. ++ time spent attempting to locate R PLS AFO to try in pt's new shoes but ultimately AFO would not fit - either need pt to get size 10 shoe or smaller AFO ([employer] aware) and pt working on getting new shoes.   Pt transported to dayroom in [facility] dependently for time management purposes. Donned LiteGait harness seated with max A and R shoe cover. Pt stood in LiteGait and stepped on/off treadmill with max A +2 for RLE management. Secured RUE to handgrip using ace wrap and worked on gait training for the following time frames: Trial 1: 2 minutes and 51 seconds at 0. for 56ft - pt required total A to advance and place RLE and demo intermittent buckling/hyperextension. Provided verbal/tactile cues for weight shifting, R knee extension, and glute activation. Trial 2: 2 minutes and 30 seconds at 0. for 6ft (tried using leg loops but did not help with foot clearance and pt reporting increased pain in R knee) Trial 3: 1 minute and 43 seconds at 0. for 95ft - pt demo increased R knee buckling/hyperextension with poor recognition of fatigue onset.   Pt performed WC mobility 171ft using  R hemi technique and supervision back to room. Removed half lap tray while propelling chair due to lap tray getting caught on wheels. Concluded session with pt sitting in [facility] with all needs within reach.   Therapy Documentation Precautions:  Precautions Precautions: Fall Recall of Precautions/Restrictions: Intact Precaution/Restrictions Comments: R dense hemi Restrictions Weight Bearing Restrictions Per Provider Order: No  Therapy/Group: Individual Therapy Therisa HERO Zaunegger Therisa Stains PT, DPT 02/11/2024, 6:53 AM

## 2024-02-12 LAB — CBC WITH DIFFERENTIAL/PLATELET
Abs Immature Granulocytes: 0.03 K/uL (ref 0.00–0.07)
Basophils Absolute: 0 K/uL (ref 0.0–0.1)
Basophils Relative: 0 %
Eosinophils Absolute: 0.2 K/uL (ref 0.0–0.5)
Eosinophils Relative: 2 %
HCT: 37.7 % (ref 36.0–46.0)
Hemoglobin: 12.3 g/dL (ref 12.0–15.0)
Immature Granulocytes: 0 %
Lymphocytes Relative: 42 %
Lymphs Abs: 3.1 K/uL (ref 0.7–4.0)
MCH: 22.7 pg — ABNORMAL LOW (ref 26.0–34.0)
MCHC: 32.6 g/dL (ref 30.0–36.0)
MCV: 69.6 fL — ABNORMAL LOW (ref 80.0–100.0)
Monocytes Absolute: 0.6 K/uL (ref 0.1–1.0)
Monocytes Relative: 8 %
Neutro Abs: 3.5 K/uL (ref 1.7–7.7)
Neutrophils Relative %: 48 %
Platelets: 263 K/uL (ref 150–400)
RBC: 5.42 MIL/uL — ABNORMAL HIGH (ref 3.87–5.11)
RDW: 16.2 % — ABNORMAL HIGH (ref 11.5–15.5)
Smear Review: NORMAL
WBC: 7.4 K/uL (ref 4.0–10.5)
nRBC: 0.3 % — ABNORMAL HIGH (ref 0.0–0.2)

## 2024-02-12 LAB — LIPID PANEL
Cholesterol: 75 mg/dL (ref 0–200)
HDL: 30 mg/dL — ABNORMAL LOW
LDL Cholesterol: 25 mg/dL (ref 0–99)
Total CHOL/HDL Ratio: 2.5 ratio
Triglycerides: 98 mg/dL
VLDL: 20 mg/dL (ref 0–40)

## 2024-02-12 LAB — GLUCOSE, CAPILLARY
Glucose-Capillary: 123 mg/dL — ABNORMAL HIGH (ref 70–99)
Glucose-Capillary: 106 mg/dL — ABNORMAL HIGH (ref 70–99)
Glucose-Capillary: 97 mg/dL (ref 70–99)
Glucose-Capillary: 144 mg/dL — ABNORMAL HIGH (ref 70–99)

## 2024-02-12 LAB — BASIC METABOLIC PANEL WITH GFR
Anion gap: 11 (ref 5–15)
BUN: 25 mg/dL — ABNORMAL HIGH (ref 8–23)
CO2: 24 mmol/L (ref 22–32)
Calcium: 9 mg/dL (ref 8.9–10.3)
Chloride: 105 mmol/L (ref 98–111)
Creatinine, Ser: 0.89 mg/dL (ref 0.44–1.00)
GFR, Estimated: >60 mL/min
Glucose, Bld: 112 mg/dL — ABNORMAL HIGH (ref 70–99)
Potassium: 3.1 mmol/L — ABNORMAL LOW (ref 3.5–5.1)
Sodium: 140 mmol/L (ref 135–145)

## 2024-02-12 MED ORDER — AMLODIPINE BESYLATE 5 MG PO TABS
5.0000 mg | ORAL_TABLET | Freq: Every day | ORAL | Status: DC
  Administered 2024-02-13 – 2024-02-17 (×5): 5 mg via ORAL
  Filled 2024-02-12 (×5): qty 1

## 2024-02-12 MED ORDER — POTASSIUM CHLORIDE 20 MEQ PO PACK
40.0000 meq | PACK | Freq: Two times a day (BID) | ORAL | Status: AC
  Administered 2024-02-12 (×2): 40 meq via ORAL
  Filled 2024-02-12 (×2): qty 2

## 2024-02-12 NOTE — Progress Notes (Addendum)
 Patient ID: Marisa [name], female   DOB: February 21, 1960, 64 y.o.   MRN: [id number]  Have reviewed team conference with pt and family. Both aware and agreeable with targeted d/c date of 01/29 and goals of Supervision/Verbal cueing.   Marisa [name] has the strong support of her family who will be able to address aher 24/7 care needs.

## 2024-02-12 NOTE — Group Note (Signed)
 Patient Details Name: Marisa [name] MRN: [id number] DOB: May 10, 1960 Today's Date: 02/12/2024  Time Calculation: OT Group Time Calculation OT Group Start Time: 1300 OT Group Stop Time: 1400 OT Group Time Calculation (min): 60 min      Group Description: Dance Group: Pt participated in dance group with an emphasis on social interaction, motor planning, increasing overall activity tolerance and bimanual tasks. All songs were selected by group members. Dance moves included AROM of BUE/BLE gross motor movements with an emphasis on building functional endurance.   Individual level documentation: Patient completed group from sitting level. Patientt needed supervision to complete various dance moves with cues for R U/LE positioning.  Patient needed min modifications during group.  Pain:  0/10  Precautions:  Felton Katheryn SHAUNNA Leavy 02/12/2024, 4:00 PM

## 2024-02-12 NOTE — Progress Notes (Signed)
 Speech Language Pathology Daily Session Note  Patient Details  Name: Marisa [name] MRN: [id number] Date of Birth: Apr 08, 1960  Today's Date: 02/12/2024 SLP Individual Time: 8891-8845 SLP Individual Time Calculation (min): 46 min  Short Term Goals: Week 1: SLP Short Term Goal 1 (Week 1): Patient will tolerate regular/thin liquid diet with supervision for use of oral clearance strategies. SLP Short Term Goal 2 (Week 1): Patient will attend to functional therapy tasks for 30 minutes with min assist. SLP Short Term Goal 3 (Week 1): Patient will recall daily events using external and internal memory aids with min assist. SLP Short Term Goal 4 (Week 1): Patient will solve mildly complex functional problems with min assist. SLP Short Term Goal 5 (Week 1): Patient will utilize speech intelligibility strategies at the sentence level with min assist.  Skilled Therapeutic Interventions: Skilled therapy session focused on cognitive goals. SLP facilitated session by targeting simple money management. Patient sorted and counted change according to verbalized amount with 85% accuracy independently, increasing to 100% accuracy given min verbal A. SLP continued to challenge patient through functional addition/subtraction with use of restaurant menus. Patient required minA to complete. Patient left in [facility] with alarm set and call bell in reach. Continue POC    Pain None reported to SLP  Therapy/Group: Individual Therapy  Jessilyn Catino M.A., CCC-SLP 02/12/2024, 7:46 AM

## 2024-02-12 NOTE — Progress Notes (Signed)
 Occupational Therapy Session Note  Patient Details  Name: Marisa [name] MRN: [id number] Date of Birth: 1960-05-28  Today's Date: 02/12/2024 OT Individual Time: 9084-8969 OT Individual Time Calculation (min): 75 min    Short Term Goals: Week 1:  OT Short Term Goal 1 (Week 1): Pt will complete toilet transfer with mod A OT Short Term Goal 2 (Week 1): Pt will complete LB dressing with mod A OT Short Term Goal 3 (Week 1): Pt will complete sit > stand with min A using LRAD  Skilled Therapeutic Interventions/Progress Updates:    1:1 Pt received in the bed. Pt able to get to the EOB with min A with extra time. Pt performed squat pivot to the w/c to the left with min A. Min A stand pivot transfer to the toilet (without BSC due to her height and not needing a raised seat). Pt required max A for toileting steps/ clothing management. Pt able to perform pericare in sitting. Min A transfer back to w/c and then into shower with grab bar to cut out shower chair. Pt able to bathe with min A sit to stand. Pt demonstrated carry over with turning on right in standing with even weight bearing for assistance with washing periarea and buttocks. With standing pt does demonstrate hyperextension of right knee and trunk/hip rotation to the left. Mod A transfer out of shower to the right;  (tight space) needed to adjust a couple of times with facilitation of right LE in the right place. Focus on sit to stands with right LE activation with min A to contact guard. Pt demonstrated right foot dosiflexion today!!! Also pt with activation with shoulder abduction today with dressing and positioning of UE!! Pt able to perform grooming with setup of supplies. PT left sitting up in w/c with half lap tray with call bell and husband at her side.   Therapy Documentation Precautions:  Precautions Precautions: Fall Recall of Precautions/Restrictions: Intact Precaution/Restrictions Comments: R dense hemi Restrictions Weight  Bearing Restrictions Per Provider Order: No  Pain: Pain Assessment Pain Scale: 0-10 Pain Score: 0-No pain    Therapy/Group: Individual Therapy  Claudene Nest [facility] 02/12/2024, 10:59 AM

## 2024-02-12 NOTE — Progress Notes (Signed)
 "                                                        PROGRESS NOTE   Subjective/Complaints: No new complaints this morning K+ 3.1, supplementation ordered Diastolic BP is 52- amlodipine  decreased to 5mg   ROS: +right sided hemiplegia- continues. See HPI,  denies chills, CP, SOB, abd pain, n/v/d/c   Objective:   VAS US  UPPER EXTREMITY VENOUS DUPLEX Result Date: 02/10/2024 UPPER VENOUS STUDY  Patient Name:  LAQUANDRA CARRILLO Berber  Date of Exam:   02/10/2024 Medical Rec #: [id number]             Accession #:    [id number] Date of Birth: 04-27-1960            Patient Gender: F Patient Age:   64 years Exam Location:  [facility] Procedure:      VAS US  UPPER EXTREMITY VENOUS DUPLEX Referring Phys: MURRAY COLLIER --------------------------------------------------------------------------------  Indications: Edema Other Indications: Recent stroke with right side deficit. Limitations: Poor ultrasound/tissue interface and patient immobility. Comparison Study: No previous exams Performing Technologist: Jody Hill RVT, RDMS  Examination Guidelines: A complete evaluation includes B-mode imaging, spectral Doppler, color Doppler, and power Doppler as needed of all accessible portions of each vessel. Bilateral testing is considered an integral part of a complete examination. Limited examinations for reoccurring indications may be performed as noted.  Right Findings: +----------+------------+---------+-----------+----------+---------------------+ RIGHT     CompressiblePhasicitySpontaneousProperties       Summary        +----------+------------+---------+-----------+----------+---------------------+ IJV           Full       Yes       Yes                                    +----------+------------+---------+-----------+----------+---------------------+ Subclavian    Full       Yes       Yes                                     +----------+------------+---------+-----------+----------+---------------------+ Axillary      Full       Yes       Yes                                    +----------+------------+---------+-----------+----------+---------------------+ Brachial      Full       Yes       Yes                only one brachial                                                            visualized       +----------+------------+---------+-----------+----------+---------------------+ Radial        Full                                                        +----------+------------+---------+-----------+----------+---------------------+  Ulnar                    Yes       Yes                    patent by                                                               color/doppler     +----------+------------+---------+-----------+----------+---------------------+ Cephalic      Full                                                        +----------+------------+---------+-----------+----------+---------------------+ Basilic       Full       Yes       Yes                                    +----------+------------+---------+-----------+----------+---------------------+  Left Findings: +----------+------------+---------+-----------+----------+-------+ LEFT      CompressiblePhasicitySpontaneousPropertiesSummary +----------+------------+---------+-----------+----------+-------+ Subclavian    Full       Yes       Yes                      +----------+------------+---------+-----------+----------+-------+  Summary:  Right: No evidence of deep vein thrombosis in the upper extremity. No evidence of superficial vein thrombosis in the upper extremity.  Left: No evidence of thrombosis in the subclavian.  *See table(s) above for measurements and observations.  Diagnosing physician: Norman Serve Electronically signed by Norman Serve on 02/10/2024 at 5:56:37 PM.    Final    Recent Labs     02/12/24 0429  WBC 7.4  HGB 12.3  HCT 37.7  PLT 263   Recent Labs    02/10/24 0450 02/12/24 0429  NA  --  140  K 3.8 3.1*  CL  --  105  CO2  --  24  GLUCOSE  --  112*  BUN  --  25*  CREATININE  --  0.89  CALCIUM   --  9.0    Intake/Output Summary (Last 24 hours) at 02/12/2024 1154 Last data filed at 02/11/2024 1842 Gross per 24 hour  Intake 480 ml  Output --  Net 480 ml        Physical Exam: Vital Signs Blood pressure (!) 116/52, pulse 60, temperature 97.7 F (36.5 C), resp. rate 16, height 5' (1.524 m), weight 105.7 kg, SpO2 97%.  Gen: no distress, normal appearing HEENT: oral mucosa pink and moist, NCAT Cardio: Reg rate Chest: normal effort, normal rate of breathing Abd: soft, non-distended Ext: no edema Psych: pleasant, normal affect Skin: intact Neuro: Alert and oriented x3, 5/5 strength on left side, 0/5 strength throughout right side, sensation is intact, stable 1/15   Assessment/Plan: 1. Functional deficits which require 3+ hours per day of interdisciplinary therapy in a comprehensive inpatient rehab setting. Physiatrist is providing close team supervision and 24 hour management of active medical problems listed below. Physiatrist and rehab team continue to assess barriers to discharge/monitor patient  progress toward functional and medical goals  Care Tool:  Bathing    Body parts bathed by patient: Right arm, Chest, Abdomen, Front perineal area, Left upper leg, Face, Right upper leg   Body parts bathed by helper: Left arm, Right lower leg, Right upper leg     Bathing assist Assist Level: Minimal Assistance - Patient > 75%     Upper Body Dressing/Undressing Upper body dressing   What is the patient wearing?: Pull over shirt    Upper body assist Assist Level: Contact Guard/Touching assist    Lower Body Dressing/Undressing Lower body dressing      What is the patient wearing?: Incontinence brief, Pants     Lower body assist Assist for  lower body dressing: Moderate Assistance - Patient 50 - 74%     Toileting Toileting    Toileting assist Assist for toileting: Maximal Assistance - Patient 25 - 49%     Transfers Chair/bed transfer  Transfers assist     Chair/bed transfer assist level: Minimal Assistance - Patient > 75%     Locomotion Ambulation   Ambulation assist      Assist level: 2 helpers Assistive device: Other (comment) (L handrial) Max distance: 36ft   Walk 10 feet activity   Assist     Assist level: 2 helpers Assistive device: Other (comment) (L handrail)   Walk 50 feet activity   Assist Walk 50 feet with 2 turns activity did not occur: Safety/medical concerns (weakness, fatigue)         Walk 150 feet activity   Assist Walk 150 feet activity did not occur: Safety/medical concerns (weakness, fatigue)         Walk 10 feet on uneven surface  activity   Assist Walk 10 feet on uneven surfaces activity did not occur: Safety/medical concerns (weakness, fatigue)         Wheelchair     Assist Is the patient using a wheelchair?: Yes Type of Wheelchair: Manual    Wheelchair assist level: Supervision/Verbal cueing Max wheelchair distance: 189ft    Wheelchair 50 feet with 2 turns activity    Assist        Assist Level: Supervision/Verbal cueing   Wheelchair 150 feet activity     Assist      Assist Level: Dependent - Patient 0%   Blood pressure (!) 116/52, pulse 60, temperature 97.7 F (36.5 C), resp. rate 16, height 5' (1.524 m), weight 105.7 kg, SpO2 97%.  Medical Problem List and Plan: 1. 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 Continue Asa and Plavix  with statin. Follow up with Dr. Chalice in 4 weeks.              -patient may  shower -ELOS/Goals: 14-18 days due to R hemiplegia- Min A hopefully, goals, mod I to supervision for SLP/dysarthria Chart and therapy notes reviewed,  continue CIR, discussed patient's progress with therapy Grounds pass ordered Vitamin D3/Metanx/Vitamin B/C complex ordered F/u with Dr. JONELLE or Fidela in clinic within 1 month Would benefit from home health aide upon discharge  Continue CIR   2.  Antithrombotics: continue Lovenox  40mg  daily; antiplatelet ASA and plavix  daily   3. Pain Management: Tylenol  prn    4. Mood/Behavior/Sleep: LCSW to follow for evaluation and support when available.              -antipsychotic agents: N/A  -trazodone  PRN   5. Neuropsych/cognition: This patient is capable of making decisions on  her own behalf.   6. Skin/Wound Care: Routine pressure relief measures.    7. FEN/AKI: encouraged oral hydration, continue vitamins/supplements, continue routine labs.    8. Screening for vitamin D  deficiency: D level 28.1 on 02/06/24, increase D3 to 2,000U daily   9. HTN: BP reviewed and is normal, continue Amlodipine  5mg  daily, d/c chorthalidone, continue Avapro  37.5mg  daily, and Bystolic  10mg  daily.  Monitor BP with activity.    10. DM2: A1c 7.2, Home metformin  held, resumed Farxiga  10mg  daily. Monitor glucose ac/hs with SSI and meal time coverage.    CBG (last 3)  Recent Labs    02/11/24 1640 02/11/24 2115 02/12/24 0548  GLUCAP 146* 144* 123*     11. HLD: Lipitor 80 mg daily, Lipid panel ordered Thursday morning as per patient's request   12. OSA: continue CPAP nightly    13. Left Thyroid Nodule: 2.2 cm thyroid nodule found on CT.  Follow up outpatient for non emergent thyroid US .    14. Class 4 Obesity:  BMI is 47.85 kg/m . Educate on diet and weight loss to promote overall health and mobility.    15. Constipation- will order Miralax  daily that she's taking as well as Dulcolax suppository tonight to help her get back on schedule. - She also wants apple juice. Mag oxide added HS. Continue senokot s 2 tabs nightly.   16. Hypokalemia: klor ordered for 2 doses  17. Right hand swelling: US  reviewed  and is negative for clot- discussed with patient  LOS: 7 days A FACE TO FACE EVALUATION WAS PERFORMED  Sven P Ebonee Stober 02/12/2024, 11:54 AM     "

## 2024-02-12 NOTE — Progress Notes (Signed)
 Physical Therapy Session Note  Patient Details  Name: Marisa [name] MRN: [id number] Date of Birth: 15-Jan-1961  Today's Date: 02/12/2024 PT Individual Time: 1031-1057 PT Individual Time Calculation (min): 26 min   Short Term Goals: Week 1:  PT Short Term Goal 1 (Week 1): pt will transfer supine<>sitting EOB with mod A consistantly PT Short Term Goal 2 (Week 1): pt will transfer sit<>stand with LRAD and min A PT Short Term Goal 3 (Week 1): pt will transfer bed<>chair with LRAD and min A  Skilled Therapeutic Interventions/Progress Updates:   Received pt sitting in [facility] with husband at bedside. Pt agreeable to PT treatment and denied any pain during session. RN present to administer medication and attempted to place R PLS AFO in new size 10 shoe but new shoes were slip in, not lace up and unable to get foot in with AFO. Husband plans to bring lace up size 10 shoe. Pt transported to/from room in [facility] dependently for time management purposes. Stood with L handrail and CGA and worked on pre-gait stepping forward/backwards with LLE, however deferred due to significant hyperextension causing knee pain. Transitioned to standing squats 4x10 with LUE support and CGA with emphasis on quad strength. Returned to room and concluded session with pt sitting in [facility] with all needs within reach.   Therapy Documentation Precautions:  Precautions Precautions: Fall Recall of Precautions/Restrictions: Intact Precaution/Restrictions Comments: R dense hemi Restrictions Weight Bearing Restrictions Per Provider Order: No  Therapy/Group: Individual Therapy Therisa HERO Zaunegger Therisa Stains PT, DPT 02/12/2024, 6:53 AM

## 2024-02-13 LAB — BASIC METABOLIC PANEL WITH GFR
Anion gap: 9 (ref 5–15)
BUN: 22 mg/dL (ref 8–23)
CO2: 27 mmol/L (ref 22–32)
Calcium: 9.6 mg/dL (ref 8.9–10.3)
Chloride: 105 mmol/L (ref 98–111)
Creatinine, Ser: 0.88 mg/dL (ref 0.44–1.00)
GFR, Estimated: >60 mL/min
Glucose, Bld: 104 mg/dL — ABNORMAL HIGH (ref 70–99)
Potassium: 3.9 mmol/L (ref 3.5–5.1)
Sodium: 140 mmol/L (ref 135–145)

## 2024-02-13 LAB — GLUCOSE, CAPILLARY
Glucose-Capillary: 127 mg/dL — ABNORMAL HIGH (ref 70–99)
Glucose-Capillary: 100 mg/dL — ABNORMAL HIGH (ref 70–99)
Glucose-Capillary: 113 mg/dL — ABNORMAL HIGH (ref 70–99)
Glucose-Capillary: 137 mg/dL — ABNORMAL HIGH (ref 70–99)

## 2024-02-13 NOTE — Progress Notes (Signed)
 Occupational Therapy Session Note  Patient Details  Name: SAMANTHA RAGEN MRN: [id number] Date of Birth: 1960/06/08  Today's Date: 02/13/2024 OT Individual Time: 8694-8654 OT Individual Time Calculation (min): 40 min    Short Term Goals: Week 1:  OT Short Term Goal 1 (Week 1): Pt will complete toilet transfer with mod A OT Short Term Goal 2 (Week 1): Pt will complete LB dressing with mod A OT Short Term Goal 3 (Week 1): Pt will complete sit > stand with min A using LRAD  Skilled Therapeutic Interventions/Progress Updates:  Skilled OT intervention completed with focus on RUE NMR. Pt received seated in w/c, agreeable to session. No pain reported.   Transported dependently in w/c <> gym. Seated at table top, pt participated in grasp and release task using saebo stim applied to R wrist extensors. Assist provided at elbow and forearm with increased activation noted with scapular protraction. Reached for balls and transferred <> box. Pt needed cues for visualization. Had pt complete x15 scapular protraction and would benefit from use in mobile arm support.   Saebo removed from wrist without adverse skin reaction and transferred to R deltoid for joint approximation. Pt remained seated in w/c, with chair alarm on/activated, and with all needs in reach at end of session.  Saebo Stim One  Applied for 60 mins (unattended) to the R deltoid for NMR with the following parameters:  330 pulse width 35 Hz pulse rate On 8 sec/ off 8 sec Ramp up/ down 2 sec Symmetrical Biphasic wave form  Max intensity at 500 Ohm load  Removed from pt at end of cycle with no adverse skin reaction or irritation noted.   Therapy Documentation Precautions:  Precautions Precautions: Fall Recall of Precautions/Restrictions: Intact Precaution/Restrictions Comments: R dense hemi Restrictions Weight Bearing Restrictions Per Provider Order: No    Therapy/Group: Individual Therapy  [name], MS,  OTR/L  02/13/2024, 1:51 PM

## 2024-02-13 NOTE — Progress Notes (Signed)
 Physical Therapy Weekly Progress Note  Patient Details  Name: ATIYA YERA MRN: [id number] Date of Birth: March 07, 1960  Beginning of progress report period: February 06, 2024 End of progress report period: February 13, 2024  Patient has met 2 of 3 short term goals. Pt demonstrates steady progress towards long term goals. Pt is currently able to perform bed mobility with mod A, sit<>stands with min A, squat<>pivots with mod A, WC mobility 135ft using R hemi technique, and ambulate 78ft with L handrail and max A or up to 51ft in LiteGait with +2 assist. Pt remains limited by R hemiparesis, decreased balance/coordination, and decreased recognition of fatigue onset. However demonstrates improved R hip flexor, DF, and PF activation! Currently working on finding appropriate shoe to trial AFO. Pt will benefit from family education prior to discharge.   Patient continues to demonstrate the following deficits muscle weakness, decreased cardiorespiratoy endurance, impaired timing and sequencing, abnormal tone, unbalanced muscle activation, and decreased coordination, and decreased standing balance, decreased postural control, hemiplegia, and decreased balance strategies and therefore will continue to benefit from skilled PT intervention to increase functional independence with mobility.  Patient progressing toward long term goals..  Continue plan of care.  PT Short Term Goals Week 1:  PT Short Term Goal 1 (Week 1): pt will transfer supine<>sitting EOB with mod A consistantly PT Short Term Goal 1 - Progress (Week 1): Met PT Short Term Goal 2 (Week 1): pt will transfer sit<>stand with LRAD and min A PT Short Term Goal 2 - Progress (Week 1): Met PT Short Term Goal 3 (Week 1): pt will transfer bed<>chair with LRAD and min A PT Short Term Goal 3 - Progress (Week 1): Progressing toward goal Week 2:  PT Short Term Goal 1 (Week 2): Pt will transfer supine<>sitting EOB with min A consistantly PT Short Term  Goal 2 (Week 2): pt will transfer bed<>chair with LRAD and min A PT Short Term Goal 3 (Week 2): pt will ambulate 46ft with LRAD and max A of 1  Skilled Therapeutic Interventions/Progress Updates:  Ambulation/gait training;Discharge planning;Functional mobility training;Psychosocial support;Therapeutic Activities;Balance/vestibular training;Disease management/prevention;Neuromuscular re-education;Skin care/wound management;Therapeutic Exercise;Wheelchair propulsion/positioning;Cognitive remediation/compensation;DME/adaptive equipment instruction;Pain management;Splinting/orthotics;UE/LE Strength taining/ROM;Community reintegration;Functional electrical stimulation;Patient/family education;Stair training;UE/LE Coordination activities   Therapy Documentation Precautions:  Precautions Precautions: Fall Recall of Precautions/Restrictions: Intact Precaution/Restrictions Comments: R dense hemi Restrictions Weight Bearing Restrictions Per Provider Order: No   Therapy/Group: Individual Therapy Therisa HERO Zaunegger Therisa Stains PT, DPT 02/13/2024, 7:24 AM

## 2024-02-13 NOTE — Progress Notes (Signed)
 Occupational Therapy Weekly Progress Note  Patient Details  Name: Marisa [name] MRN: [id number] Date of Birth: [birth year]  Beginning of progress report period: February 06, 2024 End of progress report period: February 13, 2024   Patient has met 3 of 3 short term goals.  Pt is making great progress towards LTGs. She is able to bathe with mod A, UB dress with mod A, LB dress with Max A and completes toileting with Max A. In addition, pt continues to demonstrate dense RUE/RLE hemiplegia (though increased activation in both!!), dynamic standing balance, functional endurance and higher level problem solving deficits resulting in difficulty completing BADL tasks without increased physical assist. Pt will benefit from continued skilled OT services to focus on mentioned deficits. Family ed not initiated as of date and will be needed in prep for DC.   Patient continues to demonstrate the following deficits: muscle weakness, decreased cardiorespiratoy endurance, decreased coordination and decreased motor planning, decreased problem solving, and decreased standing balance and hemiplegia and therefore will continue to benefit from skilled OT intervention to enhance overall performance with BADL and Reduce care partner burden.  Patient progressing toward long term goals..  Continue plan of care.  OT Short Term Goals Week 1:  OT Short Term Goal 1 (Week 1): Pt will complete toilet transfer with mod A OT Short Term Goal 1 - Progress (Week 1): Met OT Short Term Goal 2 (Week 1): Pt will complete LB dressing with mod A OT Short Term Goal 2 - Progress (Week 1): Met OT Short Term Goal 3 (Week 1): Pt will complete sit > stand with min A using LRAD OT Short Term Goal 3 - Progress (Week 1): Met Week 2:  OT Short Term Goal 1 (Week 2): Pt will keep RUE in safe place 100% of the time with min questioning cues for 2 consecutive sessions OT Short Term Goal 2 (Week 2): Pt will complete UB dressing with min A OT  Short Term Goal 3 (Week 2): Pt will complete toileting with mod A using LRAD    Marisa Shiflet E Rontavious Albright, MS, OTR/L  02/13/2024, 3:39 PM

## 2024-02-13 NOTE — Progress Notes (Signed)
 Physical Therapy Session Note  Patient Details  Name: Marisa [name] MRN: [id number] Date of Birth: November 26, 1960  Today's Date: 02/13/2024 PT Individual Time: 8586-8543 PT Individual Time Calculation (min): 43 min   Short Term Goals: Week 1:  PT Short Term Goal 1 (Week 1): pt will transfer supine<>sitting EOB with mod A consistantly PT Short Term Goal 2 (Week 1): pt will transfer sit<>stand with LRAD and min A PT Short Term Goal 3 (Week 1): pt will transfer bed<>chair with LRAD and min A  Skilled Therapeutic Interventions/Progress Updates:   Received pt sitting in WC, pt agreeable to PT treatment, and denied any pain during session. Pt has new shoes that now fit AFO. Session with emphasis on functional mobility/transfers, generalized strengthening and endurance, dynamic standing balance/coordination, and gait training. Removed Sabeo from RUE and transported to main gym in [facility] dependently for time management purposes.   Donned R shoe cover, then stood with L handrail and min A x 2 trials and ambulated 9ft x 2 trials with L handrail and mod A (+2 for WC follow and +2 HHA when crossing 22ft span without LUE support). Pt initially only required min A to advance R foot, but after first 53ft increased to mod A with fatigue. Pt demo excellent ability to weight shift without assist but required assist to increase R foot stride and verbal cues for step through gait pattern. Located hemi walker and demonstrated technique for ambulation using hemi walker (plan to use in next session). Performed WC mobility 158ft using R hemi technique and supervision back to room - final 33ft focusing on using BLEs only to propel WC (pt require max A to extend R knee and only able to activate hamstrings minimally to pull). Concluded session with pt sitting in [facility] with all needs within reach and NT at bedside.   Therapy Documentation Precautions:  Precautions Precautions: Fall Recall of Precautions/Restrictions:  Intact Precaution/Restrictions Comments: R dense hemi Restrictions Weight Bearing Restrictions Per Provider Order: No  Therapy/Group: Individual Therapy Therisa HERO Zaunegger Therisa Stains PT, DPT 02/13/2024, 6:50 AM

## 2024-02-13 NOTE — Progress Notes (Signed)
 Speech Language Pathology Weekly Progress and Session Note  Patient Details  Name: Marisa [name] MRN: [id number] Date of Birth: Apr 03, 1960  Beginning of progress report period: February 06, 2024 End of progress report period: February 13, 2024  Today's Date: 02/13/2024 SLP Individual Time: 9269-9187 SLP Individual Time Calculation (min): 42 min  Short Term Goals: Week 1: SLP Short Term Goal 1 (Week 1): Patient will tolerate regular/thin liquid diet with supervision for use of oral clearance strategies. SLP Short Term Goal 1 - Progress (Week 1): Met SLP Short Term Goal 2 (Week 1): Patient will attend to functional therapy tasks for 30 minutes with min assist. SLP Short Term Goal 2 - Progress (Week 1): Met SLP Short Term Goal 3 (Week 1): Patient will recall daily events using external and internal memory aids with min assist. SLP Short Term Goal 3 - Progress (Week 1): Met SLP Short Term Goal 4 (Week 1): Patient will solve mildly complex functional problems with min assist. SLP Short Term Goal 4 - Progress (Week 1): Met SLP Short Term Goal 5 (Week 1): Patient will utilize speech intelligibility strategies at the sentence level with min assist. SLP Short Term Goal 5 - Progress (Week 1): Met    New Short Term Goals: Week 2: SLP Short Term Goal 1 (Week 2): Patient will tolerate regular/thin liquid diet with modI for use of oral clearance strategies. SLP Short Term Goal 2 (Week 2): Patient will attend to functional therapy tasks for 30 minutes with supervision assist. SLP Short Term Goal 3 (Week 2): Patient will recall daily events using external and internal memory aids with supervision assist. SLP Short Term Goal 4 (Week 2): Patient will solve mildly complex functional problems with supervision assist. SLP Short Term Goal 5 (Week 2): Patient will utilize speech intelligibility strategies at the sentence level with supervision assist.  Weekly Progress Updates: Patient has made excellent  progress towards therapy goals, meeting 5/5 short term goals set this reporting period. Patient requires supervision assist for consistent and thorough use of oral clearance strategies during consumption of regular/thin liquid diet. She benefits from min assist for use of speech intelligibility strategies at the complex sentence level. Patient also benefits from min assist for recall of detailed daily information, solving mildly complex problems (with higher levels of breakdowns occurring with verbally presented information requiring mental manipulation without visual aid), and sustained attention to therapy tasks for 30+ minutes. Patient and family education ongoing. Patient will continue to benefit from skilled therapy services during remainder of CIR stay.      Intensity: Minumum of 1-2 x/day, 30 to 90 minutes Frequency: 3 to 5 out of 7 days Duration/Length of Stay: 1/29 Treatment/Interventions: Cognitive remediation/compensation;Environmental controls;Cueing hierarchy;Functional tasks;Internal/external aids;Dysphagia/aspiration precaution training;Patient/family education;Therapeutic Activities   Daily Session  Skilled Therapeutic Interventions: SLP conducted skilled therapy session targeting cognitive and communication goals. Patient benefited from reeducation re: SLOP speech intelligibility strategies. SLP reviewed visual aid provided at previous targeted session and placed it in more visible location for easy daily reference. Patient utilized speech intelligibility strategies at the complex sentence level with min cues for consistent volume increase and to improve breath control/support during longer utterances. Patient ~90% intelligible throughout. SLP then facilitated memory task where patient was provided with pictured information to memorize. After 2-minute memorization time, patient recalled detailed information with 85% accuracy. Patient was left in room with call bell in reach and alarm set.  SLP will continue to target goals per plan of care.  Pain  None endorsed  Therapy/Group: Individual Therapy  [name] A [name] 02/13/2024, 8:13 AM

## 2024-02-13 NOTE — Progress Notes (Signed)
 Physical Therapy Session Note  Patient Details  Name: Marisa [name] MRN: [id number] Date of Birth: 11/20/1960  Today's Date: 02/13/2024 PT Individual Time: 9064-8954 PT Individual Time Calculation (min): 70 min   Short Term Goals: Week 2:  PT Short Term Goal 1 (Week 2): Pt will transfer supine<>sitting EOB with min A consistantly PT Short Term Goal 2 (Week 2): pt will transfer bed<>chair with LRAD and min A PT Short Term Goal 3 (Week 2): pt will ambulate 54ft with LRAD and max A of 1  Skilled Therapeutic Interventions/Progress Updates: Pt presents sitting in w/c and agreeable to therapy.  AFO donned to R foot as spouse has brought bigger size shoe.  Pt wheeled to main gym for energy conservation.  Pt performed squat pivot w/c > mat table w/ mod/min A but cues for sequencing.  Pt performed block sit to stand w/ min A and min blocking of R knee.  2 platform placed under L foot for increased NMR to R LE.  Pt able to stand w/o UE support.  Pt cued for soft L knee for increased WB RLE, pt stood eyes closed w/ cueing for weight shift.  Pt amb at L siderail w/ minA for blocking L knee especially to avoid hyperextension.  Pt states no pain w/ WB.  Pt remained sitting in w/c w/ chair alarm on and all needs in reach.   Therapy Documentation Precautions:  Precautions Precautions: Fall Recall of Precautions/Restrictions: Intact Precaution/Restrictions Comments: R dense hemi Restrictions Weight Bearing Restrictions Per Provider Order: No General:   Vital Signs:   Pain:0/10      Therapy/Group: Individual Therapy  Deigo Alonso P Jamahl Lemmons 02/13/2024, 10:51 AM

## 2024-02-13 NOTE — Progress Notes (Signed)
 "                                                        PROGRESS NOTE   Subjective/Complaints: No new complaints this morning Repeat BMP ordered to f/u potassium after repletion Discussed that CBGs have been great, ISS at night d/ced  ROS: +right sided hemiplegia- continues. See HPI,  denies chills, CP, SOB, abd pain, n/v/d/c   Objective:   No results found.  Recent Labs    02/12/24 0429  WBC 7.4  HGB 12.3  HCT 37.7  PLT 263   Recent Labs    02/12/24 0429  NA 140  K 3.1*  CL 105  CO2 24  GLUCOSE 112*  BUN 25*  CREATININE 0.89  CALCIUM  9.0    Intake/Output Summary (Last 24 hours) at 02/13/2024 1010 Last data filed at 02/13/2024 0800 Gross per 24 hour  Intake 354 ml  Output --  Net 354 ml        Physical Exam: Vital Signs Blood pressure 132/63, pulse 60, temperature 98.1 F (36.7 C), temperature source Oral, resp. rate 16, height 5' (1.524 m), weight 105.7 kg, SpO2 98%.  Gen: no distress, normal appearing HEENT: oral mucosa pink and moist, NCAT Cardio: Reg rate Chest: normal effort, normal rate of breathing Abd: soft, non-distended Ext: no edema Psych: pleasant, normal affect Skin: intact Neuro: Alert and oriented x3, 5/5 strength on left side, 0/5 strength throughout right side, sensation is intact, stable 1/16   Assessment/Plan: 1. Functional deficits which require 3+ hours per day of interdisciplinary therapy in a comprehensive inpatient rehab setting. Physiatrist is providing close team supervision and 24 hour management of active medical problems listed below. Physiatrist and rehab team continue to assess barriers to discharge/monitor patient progress toward functional and medical goals  Care Tool:  Bathing    Body parts bathed by patient: Right arm, Chest, Abdomen, Front perineal area, Left upper leg, Face, Right upper leg   Body parts bathed by helper: Left arm, Right lower leg, Right upper leg     Bathing assist Assist Level: Minimal  Assistance - Patient > 75%     Upper Body Dressing/Undressing Upper body dressing   What is the patient wearing?: Pull over shirt    Upper body assist Assist Level: Contact Guard/Touching assist    Lower Body Dressing/Undressing Lower body dressing      What is the patient wearing?: Incontinence brief, Pants     Lower body assist Assist for lower body dressing: Moderate Assistance - Patient 50 - 74%     Toileting Toileting    Toileting assist Assist for toileting: Maximal Assistance - Patient 25 - 49%     Transfers Chair/bed transfer  Transfers assist     Chair/bed transfer assist level: Minimal Assistance - Patient > 75%     Locomotion Ambulation   Ambulation assist      Assist level: 2 helpers Assistive device: Other (comment) (L handrial) Max distance: 53ft   Walk 10 feet activity   Assist     Assist level: 2 helpers Assistive device: Other (comment) (L handrail)   Walk 50 feet activity   Assist Walk 50 feet with 2 turns activity did not occur: Safety/medical concerns (weakness, fatigue)         Walk 150 feet activity  Assist Walk 150 feet activity did not occur: Safety/medical concerns (weakness, fatigue)         Walk 10 feet on uneven surface  activity   Assist Walk 10 feet on uneven surfaces activity did not occur: Safety/medical concerns (weakness, fatigue)         Wheelchair     Assist Is the patient using a wheelchair?: Yes Type of Wheelchair: Manual    Wheelchair assist level: Supervision/Verbal cueing Max wheelchair distance: 130ft    Wheelchair 50 feet with 2 turns activity    Assist        Assist Level: Supervision/Verbal cueing   Wheelchair 150 feet activity     Assist      Assist Level: Dependent - Patient 0%   Blood pressure 132/63, pulse 60, temperature 98.1 F (36.7 C), temperature source Oral, resp. rate 16, height 5' (1.524 m), weight 105.7 kg, SpO2 98%.  Medical Problem List  and Plan: 1. 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 Continue Asa and Plavix  with statin. Follow up with Dr. Chalice in 4 weeks.              -patient may  shower -ELOS/Goals: 14-18 days due to R hemiplegia- Min A hopefully, goals, mod I to supervision for SLP/dysarthria Chart and therapy notes reviewed, continue CIR, discussed patient's progress with therapy Grounds pass ordered Vitamin D3/Metanx/Vitamin B/C complex ordered F/u with Dr. JONELLE or Fidela in clinic within 1 month Would benefit from home health aide upon discharge  Continue CIR   2.  Antithrombotics: continue Lovenox  40mg  daily; antiplatelet ASA and plavix  daily   3. Pain Management: Tylenol  prn    4. Mood/Behavior/Sleep: LCSW to follow for evaluation and support when available.              -antipsychotic agents: N/A  -trazodone  PRN   5. Neuropsych/cognition: This patient is capable of making decisions on her own behalf.   6. Skin/Wound Care: Routine pressure relief measures.    7. FEN/AKI: encouraged oral hydration, continue vitamins/supplements, continue routine labs.    8. Screening for vitamin D  deficiency: D level 28.1 on 02/06/24, increase D3 to 2,000U daily   9. HTN: BP reviewed and is normal, continue Amlodipine  5mg  daily, d/c chorthalidone, continue Avapro  37.5mg  daily, and Bystolic  10mg  daily.  Monitor BP with activity.   10. DM2: A1c 7.2, Home metformin  held, resumed Farxiga  10mg  daily. D/c HS ISS  CBG (last 3)  Recent Labs    02/12/24 1716 02/12/24 2103 02/13/24 0556  GLUCAP 97 144* 127*     11. HLD: Lipitor 80 mg daily, Lipid panel ordered Thursday morning as per patient's request   12. OSA: continue CPAP nightly    13. Left Thyroid Nodule: 2.2 cm thyroid nodule found on CT.  Follow up outpatient for non emergent thyroid US .    14. Class 4 Obesity:  BMI is 47.85 kg/m . Educate on diet and weight loss to promote overall health and  mobility.    15. Constipation- will order Miralax  daily that she's taking as well as Dulcolax suppository tonight to help her get back on schedule. - She also wants apple juice. Mag oxide added HS. Continue senokot s 2 tabs nightly.   16. Hypokalemia: klor ordered for 2 doses on 1/15, repeat BMP ordered 1/16  17. Right hand swelling: US  reviewed and is negative for clot- discussed with patient  LOS: 8 days A FACE TO FACE EVALUATION WAS  PERFORMED  Marisa [name] 02/13/2024, 10:10 AM     "

## 2024-02-14 LAB — GLUCOSE, CAPILLARY
Glucose-Capillary: 117 mg/dL — ABNORMAL HIGH (ref 70–99)
Glucose-Capillary: 122 mg/dL — ABNORMAL HIGH (ref 70–99)
Glucose-Capillary: 138 mg/dL — ABNORMAL HIGH (ref 70–99)

## 2024-02-14 NOTE — Plan of Care (Signed)
" °  Problem: Consults Goal: RH STROKE PATIENT EDUCATION Description: See Patient Education module for education specifics  Outcome: Progressing Goal: Nutrition Consult-if indicated Outcome: Progressing Goal: Diabetes Guidelines if Diabetic/Glucose > 140 Description: If diabetic or lab glucose is > 140 mg/dl - Initiate Diabetes/Hyperglycemia Guidelines & Document Interventions  Outcome: Progressing   Problem: RH BOWEL ELIMINATION Goal: RH STG MANAGE BOWEL WITH ASSISTANCE Description: STG Manage Bowel with mod I Assistance. Outcome: Progressing Goal: RH STG MANAGE BOWEL W/MEDICATION W/ASSISTANCE Description: STG Manage Bowel with Medication with mod I Assistance. Outcome: Progressing   Problem: RH BLADDER ELIMINATION Goal: RH STG MANAGE BLADDER WITH ASSISTANCE Description: STG Manage Bladder With toileting Assistance Outcome: Progressing   Problem: RH SAFETY Goal: RH STG ADHERE TO SAFETY PRECAUTIONS W/ASSISTANCE/DEVICE Description: STG Adhere to Safety Precautions With cues Assistance/Device. Outcome: Progressing   Problem: RH KNOWLEDGE DEFICIT Goal: RH STG INCREASE KNOWLEDGE OF DIABETES Description: Patient and spouse will be able to manage DM using educational resources for medications and dietary modification independently Outcome: Progressing Goal: RH STG INCREASE KNOWLEDGE OF HYPERTENSION Description: Patient and spouse will be able to manage HTN using educational resources for medications and dietary modification independently Outcome: Progressing Goal: RH STG INCREASE KNOWLEGDE OF HYPERLIPIDEMIA Description: Patient and spouse will be able to manage HLD using educational resources for medications and dietary modification independently Outcome: Progressing Goal: RH STG INCREASE KNOWLEDGE OF STROKE PROPHYLAXIS Description: Patient and spouse will be able to manage secondary risks using educational resources for medications and dietary modification independently Outcome:  Progressing   "

## 2024-02-14 NOTE — Progress Notes (Signed)
 "                                                        PROGRESS NOTE   Subjective/Complaints:  Pt doing well, slept well, denies pain except sometimes her back will hurt when she's in bed at night, but it resolves when she sits in the w/c. None currently. LBM this morning, urinating fine per pt. No other complaints or concerns.    ROS: +right sided hemiplegia- continues. See HPI,  denies fevers/chills, CP, SOB, abd pain, n/v/d/c   Objective:   No results found.  Recent Labs    02/12/24 0429  WBC 7.4  HGB 12.3  HCT 37.7  PLT 263   Recent Labs    02/12/24 0429 02/13/24 1151  NA 140 140  K 3.1* 3.9  CL 105 105  CO2 24 27  GLUCOSE 112* 104*  BUN 25* 22  CREATININE 0.89 0.88  CALCIUM  9.0 9.6    Intake/Output Summary (Last 24 hours) at 02/14/2024 0803 Last data filed at 02/14/2024 0745 Gross per 24 hour  Intake 338 ml  Output --  Net 338 ml        Physical Exam: Vital Signs Blood pressure (!) 121/59, pulse (!) 58, temperature 97.8 F (36.6 C), temperature source Oral, resp. rate 18, height 5' (1.524 m), weight 105.7 kg, SpO2 99%.  Gen: no distress, sitting in w/c, comfortable HEENT: oral mucosa pink and moist, NCAT Cardio: bradycardic, reg rhythm, no m/r/g appreciated Chest: normal effort, normal rate of breathing, CTAB Abd: soft, non-distended, nontender, +BS throughout Ext: BLE nonpitting edema Psych: pleasant, normal affect Skin: intact to exposed surfaces  PRIOR EXAMS: Neuro: Alert and oriented x3, 5/5 strength on left side, 0/5 strength throughout right side, sensation is intact   Assessment/Plan: 1. Functional deficits which require 3+ hours per day of interdisciplinary therapy in a comprehensive inpatient rehab setting. Physiatrist is providing close team supervision and 24 hour management of active medical problems listed below. Physiatrist and rehab team continue to assess barriers to discharge/monitor patient progress toward functional and  medical goals  Care Tool:  Bathing    Body parts bathed by patient: Right arm, Chest, Abdomen, Front perineal area, Left upper leg, Face, Right upper leg   Body parts bathed by helper: Left arm, Right lower leg, Right upper leg     Bathing assist Assist Level: Minimal Assistance - Patient > 75%     Upper Body Dressing/Undressing Upper body dressing   What is the patient wearing?: Pull over shirt    Upper body assist Assist Level: Contact Guard/Touching assist    Lower Body Dressing/Undressing Lower body dressing      What is the patient wearing?: Incontinence brief, Pants     Lower body assist Assist for lower body dressing: Moderate Assistance - Patient 50 - 74%     Toileting Toileting    Toileting assist Assist for toileting: Maximal Assistance - Patient 25 - 49%     Transfers Chair/bed transfer  Transfers assist     Chair/bed transfer assist level: Minimal Assistance - Patient > 75%     Locomotion Ambulation   Ambulation assist      Assist level: 2 helpers Assistive device: Other (comment) (L handrail) Max distance: 36   Walk 10 feet activity   Assist  Assist level: 2 helpers Assistive device: Other (comment) (L handrail)   Walk 50 feet activity   Assist Walk 50 feet with 2 turns activity did not occur: Safety/medical concerns (weakness, fatigue)         Walk 150 feet activity   Assist Walk 150 feet activity did not occur: Safety/medical concerns (weakness, fatigue)         Walk 10 feet on uneven surface  activity   Assist Walk 10 feet on uneven surfaces activity did not occur: Safety/medical concerns (weakness, fatigue)         Wheelchair     Assist Is the patient using a wheelchair?: Yes Type of Wheelchair: Manual    Wheelchair assist level: Supervision/Verbal cueing Max wheelchair distance: 140ft    Wheelchair 50 feet with 2 turns activity    Assist        Assist Level: Supervision/Verbal  cueing   Wheelchair 150 feet activity     Assist      Assist Level: Dependent - Patient 0%   Blood pressure (!) 121/59, pulse (!) 58, temperature 97.8 F (36.6 C), temperature source Oral, resp. rate 18, height 5' (1.524 m), weight 105.7 kg, SpO2 99%.  Medical Problem List and Plan: 1. 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 Continue Asa and Plavix  with statin. Follow up with Dr. Chalice in 4 weeks.              -patient may  shower -ELOS/Goals: 14-18 days due to R hemiplegia- Min A hopefully, goals, mod I to supervision for SLP/dysarthria Chart and therapy notes reviewed, continue CIR, discussed patient's progress with therapy Grounds pass ordered Vitamin D3/Metanx/Vitamin B/C complex ordered F/u with Dr. JONELLE or Fidela in clinic within 1 month Would benefit from home health aide upon discharge  Continue CIR, d/c goal 1/29   2.  Antithrombotics: continue Lovenox  40mg  daily; antiplatelet ASA and plavix  daily   3. Pain Management: Tylenol  prn    4. Mood/Behavior/Sleep: LCSW to follow for evaluation and support when available.              -antipsychotic agents: N/A  -trazodone  PRN   5. Neuropsych/cognition: This patient is capable of making decisions on her own behalf.   6. Skin/Wound Care: Routine pressure relief measures.    7. FEN/AKI: encouraged oral hydration, continue vitamins/supplements, continue routine labs.    8. Screening for vitamin D  deficiency: D level 28.1 on 02/06/24, increase D3 to 2,000U daily   9. HTN: BP reviewed and is normal, continue Amlodipine  5mg  daily, d/c chorthalidone 25mg  daily, continue Avapro  37.5mg  daily and Bystolic  10mg  daily.  Monitor BP with activity. Stable Vitals:   02/10/24 0440 02/10/24 1349 02/10/24 1931 02/11/24 0540  BP: 126/65 117/65 127/64 126/67   02/11/24 2041 02/12/24 0324 02/12/24 1345 02/12/24 2009  BP: 130/68 (!) 116/52 (!) 105/57 125/61   02/13/24 0600  02/13/24 1511 02/13/24 2016 02/14/24 0500  BP: 132/63 117/70 118/62 (!) 121/59    10. DM2: A1c 7.2, Home metformin  held, resumed Farxiga  10mg  daily. D/c HS ISS -02/14/24 CBGs looking great, might be able to d/c SSI entirely. Monitor trend this weekend.   CBG (last 3)  Recent Labs    02/13/24 1632 02/13/24 2028 02/14/24 0532  GLUCAP 113* 137* 117*     11. HLD: Lipitor 80 mg daily, Lipid panel ordered Thursday morning as per patient's request   12. OSA: continue CPAP nightly    13. Left Thyroid  Nodule: 2.2 cm thyroid nodule found on CT.  Follow up outpatient for non emergent thyroid US .    14. Class 4 Obesity:  BMI is 47.85 kg/m . Educate on diet and weight loss to promote overall health and mobility.    15. Constipation- will order Miralax  daily that she's taking as well as Dulcolax suppository tonight to help her get back on schedule. - She also wants apple juice. Mag oxide added HS. Continue senokot s 2 tabs nightly. LBM 02/14/24   16. Hypokalemia: 40meq klor ordered for 2 doses on 1/15, repeat BMP ordered 1/16-- K 3.9  17. Right hand swelling: US  reviewed and is negative for clot- discussed with patient   LOS: 9 days A FACE TO FACE EVALUATION WAS PERFORMED  [id number] [employer] Nazia Rhines 02/14/2024, 8:03 AM     "

## 2024-02-15 LAB — GLUCOSE, CAPILLARY
Glucose-Capillary: 127 mg/dL — ABNORMAL HIGH (ref 70–99)
Glucose-Capillary: 134 mg/dL — ABNORMAL HIGH (ref 70–99)
Glucose-Capillary: 136 mg/dL — ABNORMAL HIGH (ref 70–99)
Glucose-Capillary: 117 mg/dL — ABNORMAL HIGH (ref 70–99)
Glucose-Capillary: 137 mg/dL — ABNORMAL HIGH (ref 70–99)

## 2024-02-15 NOTE — Plan of Care (Signed)
" °  Problem: Consults Goal: RH STROKE PATIENT EDUCATION Description: See Patient Education module for education specifics  Outcome: Progressing Goal: Nutrition Consult-if indicated Outcome: Progressing Goal: Diabetes Guidelines if Diabetic/Glucose > 140 Description: If diabetic or lab glucose is > 140 mg/dl - Initiate Diabetes/Hyperglycemia Guidelines & Document Interventions  Outcome: Progressing   Problem: RH BOWEL ELIMINATION Goal: RH STG MANAGE BOWEL WITH ASSISTANCE Description: STG Manage Bowel with mod I Assistance. Outcome: Progressing Goal: RH STG MANAGE BOWEL W/MEDICATION W/ASSISTANCE Description: STG Manage Bowel with Medication with mod I Assistance. Outcome: Progressing   Problem: RH BLADDER ELIMINATION Goal: RH STG MANAGE BLADDER WITH ASSISTANCE Description: STG Manage Bladder With toileting Assistance Outcome: Progressing   Problem: RH SAFETY Goal: RH STG ADHERE TO SAFETY PRECAUTIONS W/ASSISTANCE/DEVICE Description: STG Adhere to Safety Precautions With cues Assistance/Device. Outcome: Progressing   Problem: RH KNOWLEDGE DEFICIT Goal: RH STG INCREASE KNOWLEDGE OF DIABETES Description: Patient and spouse will be able to manage DM using educational resources for medications and dietary modification independently Outcome: Progressing Goal: RH STG INCREASE KNOWLEDGE OF HYPERTENSION Description: Patient and spouse will be able to manage HTN using educational resources for medications and dietary modification independently Outcome: Progressing Goal: RH STG INCREASE KNOWLEGDE OF HYPERLIPIDEMIA Description: Patient and spouse will be able to manage HLD using educational resources for medications and dietary modification independently Outcome: Progressing Goal: RH STG INCREASE KNOWLEDGE OF STROKE PROPHYLAXIS Description: Patient and spouse will be able to manage secondary risks using educational resources for medications and dietary modification independently Outcome:  Progressing   "

## 2024-02-15 NOTE — Progress Notes (Signed)
 "                                                        PROGRESS NOTE   Subjective/Complaints:  Pt doing well again, but slept poorly because she took a long nap yesterday and because the bed is uncomfortable-- sleeps better in the w/c. Denies pain except her back will hurt when she's in bed at night, but none currently/while in w/c. LBM this morning, urinating fine. No other complaints or concerns.    ROS: +right sided hemiplegia- continues. See HPI,  denies fevers/chills, CP, SOB, abd pain, n/v/d/c   Objective:   No results found.  No results for input(s): WBC, HGB, HCT, PLT in the last 72 hours.  Recent Labs    02/13/24 1151  NA 140  K 3.9  CL 105  CO2 27  GLUCOSE 104*  BUN 22  CREATININE 0.88  CALCIUM  9.6    Intake/Output Summary (Last 24 hours) at 02/15/2024 1244 Last data filed at 02/15/2024 0800 Gross per 24 hour  Intake 476 ml  Output --  Net 476 ml        Physical Exam: Vital Signs Blood pressure 115/62, pulse (!) 59, temperature 98.5 F (36.9 C), temperature source Oral, resp. rate 18, height 5' (1.524 m), weight 105.7 kg, SpO2 99%.  Gen: no distress, sitting in w/c, comfortable HEENT: oral mucosa pink and moist, NCAT Cardio: bradycardic, reg rhythm, no m/r/g appreciated Chest: normal effort, normal rate of breathing, CTAB Abd: soft, non-distended, nontender, +BS throughout Ext: BLE nonpitting edema Psych: pleasant, normal affect, interactive Skin: intact to exposed surfaces  PRIOR EXAMS: Neuro: Alert and oriented x3, 5/5 strength on left side, 0/5 strength throughout right side, sensation is intact   Assessment/Plan: 1. Functional deficits which require 3+ hours per day of interdisciplinary therapy in a comprehensive inpatient rehab setting. Physiatrist is providing close team supervision and 24 hour management of active medical problems listed below. Physiatrist and rehab team continue to assess barriers to discharge/monitor patient  progress toward functional and medical goals  Care Tool:  Bathing    Body parts bathed by patient: Right arm, Chest, Abdomen, Front perineal area, Left upper leg, Face, Right upper leg   Body parts bathed by helper: Left arm, Right lower leg, Right upper leg     Bathing assist Assist Level: Minimal Assistance - Patient > 75%     Upper Body Dressing/Undressing Upper body dressing   What is the patient wearing?: Pull over shirt    Upper body assist Assist Level: Contact Guard/Touching assist    Lower Body Dressing/Undressing Lower body dressing      What is the patient wearing?: Incontinence brief, Pants     Lower body assist Assist for lower body dressing: Moderate Assistance - Patient 50 - 74%     Toileting Toileting    Toileting assist Assist for toileting: Maximal Assistance - Patient 25 - 49%     Transfers Chair/bed transfer  Transfers assist     Chair/bed transfer assist level: Minimal Assistance - Patient > 75%     Locomotion Ambulation   Ambulation assist      Assist level: 2 helpers Assistive device: Other (comment) (L handrail) Max distance: 36   Walk 10 feet activity   Assist     Assist level: 2 helpers  Assistive device: Other (comment) (L handrail)   Walk 50 feet activity   Assist Walk 50 feet with 2 turns activity did not occur: Safety/medical concerns (weakness, fatigue)         Walk 150 feet activity   Assist Walk 150 feet activity did not occur: Safety/medical concerns (weakness, fatigue)         Walk 10 feet on uneven surface  activity   Assist Walk 10 feet on uneven surfaces activity did not occur: Safety/medical concerns (weakness, fatigue)         Wheelchair     Assist Is the patient using a wheelchair?: Yes Type of Wheelchair: Manual    Wheelchair assist level: Supervision/Verbal cueing Max wheelchair distance: 116ft    Wheelchair 50 feet with 2 turns activity    Assist         Assist Level: Supervision/Verbal cueing   Wheelchair 150 feet activity     Assist      Assist Level: Dependent - Patient 0%   Blood pressure 115/62, pulse (!) 59, temperature 98.5 F (36.9 C), temperature source Oral, resp. rate 18, height 5' (1.524 m), weight 105.7 kg, SpO2 99%.  Medical Problem List and Plan: 1. 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 Continue Asa and Plavix  with statin. Follow up with Dr. Chalice in 4 weeks.              -patient may  shower -ELOS/Goals: 14-18 days due to R hemiplegia- Min A hopefully, goals, mod I to supervision for SLP/dysarthria Chart and therapy notes reviewed, continue CIR, discussed patient's progress with therapy Grounds pass ordered Vitamin D3/Metanx/Vitamin B/C complex ordered F/u with Dr. JONELLE or Fidela in clinic within 1 month Would benefit from home health aide upon discharge  Continue CIR, d/c goal 1/29   2.  Antithrombotics: continue Lovenox  40mg  daily; antiplatelet ASA and plavix  daily   3. Pain Management: Tylenol  prn    4. Mood/Behavior/Sleep: LCSW to follow for evaluation and support when available.              -antipsychotic agents: N/A  -trazodone  PRN   5. Neuropsych/cognition: This patient is capable of making decisions on her own behalf.   6. Skin/Wound Care: Routine pressure relief measures.    7. FEN/AKI: encouraged oral hydration, continue vitamins/supplements, continue routine labs.    8. Screening for vitamin D  deficiency: D level 28.1 on 02/06/24, increase D3 to 2,000U daily   9. HTN: BP reviewed and is normal, continue Amlodipine  5mg  daily, d/c chorthalidone 25mg  daily, continue Avapro  37.5mg  daily and Bystolic  10mg  daily.  Monitor BP with activity. Stable Vitals:   02/11/24 0540 02/11/24 2041 02/12/24 0324 02/12/24 1345  BP: 126/67 130/68 (!) 116/52 (!) 105/57   02/12/24 2009 02/13/24 0600 02/13/24 1511 02/13/24 2016  BP: 125/61 132/63  117/70 118/62   02/14/24 0500 02/14/24 1310 02/14/24 1940 02/15/24 0549  BP: (!) 121/59 123/70 (!) 112/53 115/62    10. DM2: A1c 7.2, Home metformin  held, resumed Farxiga  10mg  daily. D/c HS ISS -1/17-18/26 CBGs looking great, might be able to d/c SSI entirely. Monitor trend, weekday team to decide tomorrow  CBG (last 3)  Recent Labs    02/15/24 0537 02/15/24 0557 02/15/24 1129  GLUCAP 134* 127* 136*     11. HLD: Lipitor 80 mg daily, Lipid panel ordered Thursday morning as per patient's request   12. OSA: continue CPAP nightly    13. Left Thyroid Nodule: 2.2  cm thyroid nodule found on CT.  Follow up outpatient for non emergent thyroid US .    14. Class 4 Obesity:  BMI is 47.85 kg/m . Educate on diet and weight loss to promote overall health and mobility.    15. Constipation- will order Miralax  daily that she's taking as well as Dulcolax suppository tonight to help her get back on schedule. - She also wants apple juice. Mag oxide added HS. Continue senokot s 2 tabs nightly. LBM 02/15/24, going regularly for the most part  16. Hypokalemia: 40meq klor ordered for 2 doses on 1/15, repeat BMP ordered 1/16-- K 3.9  17. Right hand swelling: US  reviewed and is negative for clot- discussed with patient   LOS: 10 days A FACE TO FACE EVALUATION WAS PERFORMED  [address] 02/15/2024, 12:44 PM     "

## 2024-02-15 NOTE — Progress Notes (Signed)
Placed patient on CPAP for the night.  

## 2024-02-16 LAB — GLUCOSE, CAPILLARY
Glucose-Capillary: 115 mg/dL — ABNORMAL HIGH (ref 70–99)
Glucose-Capillary: 103 mg/dL — ABNORMAL HIGH (ref 70–99)
Glucose-Capillary: 108 mg/dL — ABNORMAL HIGH (ref 70–99)
Glucose-Capillary: 121 mg/dL — ABNORMAL HIGH (ref 70–99)
Glucose-Capillary: 153 mg/dL — ABNORMAL HIGH (ref 70–99)

## 2024-02-16 LAB — CBC WITH DIFFERENTIAL/PLATELET
Abs Immature Granulocytes: 0.02 K/uL (ref 0.00–0.07)
Basophils Absolute: 0 K/uL (ref 0.0–0.1)
Basophils Relative: 0 %
Eosinophils Absolute: 0.1 K/uL (ref 0.0–0.5)
Eosinophils Relative: 2 %
HCT: 35.5 % — ABNORMAL LOW (ref 36.0–46.0)
Hemoglobin: 11.6 g/dL — ABNORMAL LOW (ref 12.0–15.0)
Immature Granulocytes: 0 %
Lymphocytes Relative: 40 %
Lymphs Abs: 3.2 K/uL (ref 0.7–4.0)
MCH: 22.9 pg — ABNORMAL LOW (ref 26.0–34.0)
MCHC: 32.7 g/dL (ref 30.0–36.0)
MCV: 70 fL — ABNORMAL LOW (ref 80.0–100.0)
Monocytes Absolute: 0.5 K/uL (ref 0.1–1.0)
Monocytes Relative: 7 %
Neutro Abs: 4 K/uL (ref 1.7–7.7)
Neutrophils Relative %: 51 %
Platelets: 271 K/uL (ref 150–400)
RBC: 5.07 MIL/uL (ref 3.87–5.11)
RDW: 16.3 % — ABNORMAL HIGH (ref 11.5–15.5)
WBC: 7.9 K/uL (ref 4.0–10.5)
nRBC: 0 % (ref 0.0–0.2)

## 2024-02-16 LAB — BASIC METABOLIC PANEL WITH GFR
Anion gap: 10 (ref 5–15)
BUN: 26 mg/dL — ABNORMAL HIGH (ref 8–23)
CO2: 26 mmol/L (ref 22–32)
Calcium: 8.9 mg/dL (ref 8.9–10.3)
Chloride: 104 mmol/L (ref 98–111)
Creatinine, Ser: 0.86 mg/dL (ref 0.44–1.00)
GFR, Estimated: >60 mL/min
Glucose, Bld: 107 mg/dL — ABNORMAL HIGH (ref 70–99)
Potassium: 3.4 mmol/L — ABNORMAL LOW (ref 3.5–5.1)
Sodium: 140 mmol/L (ref 135–145)

## 2024-02-16 MED ORDER — POTASSIUM CHLORIDE 20 MEQ PO PACK
40.0000 meq | PACK | Freq: Once | ORAL | Status: AC
  Administered 2024-02-16: 40 meq via ORAL
  Filled 2024-02-16: qty 2

## 2024-02-16 MED ORDER — POTASSIUM CHLORIDE 20 MEQ PO PACK
20.0000 meq | PACK | Freq: Once | ORAL | Status: AC
  Administered 2024-02-16: 20 meq via ORAL
  Filled 2024-02-16: qty 1

## 2024-02-16 NOTE — Plan of Care (Signed)
" °  Problem: Consults Goal: RH STROKE PATIENT EDUCATION Description: See Patient Education module for education specifics  Outcome: Progressing Goal: Nutrition Consult-if indicated Outcome: Progressing Goal: Diabetes Guidelines if Diabetic/Glucose > 140 Description: If diabetic or lab glucose is > 140 mg/dl - Initiate Diabetes/Hyperglycemia Guidelines & Document Interventions  Outcome: Progressing   Problem: RH BOWEL ELIMINATION Goal: RH STG MANAGE BOWEL WITH ASSISTANCE Description: STG Manage Bowel with mod I Assistance. Outcome: Progressing Goal: RH STG MANAGE BOWEL W/MEDICATION W/ASSISTANCE Description: STG Manage Bowel with Medication with mod I Assistance. Outcome: Progressing   Problem: RH BLADDER ELIMINATION Goal: RH STG MANAGE BLADDER WITH ASSISTANCE Description: STG Manage Bladder With toileting Assistance Outcome: Progressing   Problem: RH SAFETY Goal: RH STG ADHERE TO SAFETY PRECAUTIONS W/ASSISTANCE/DEVICE Description: STG Adhere to Safety Precautions With cues Assistance/Device. Outcome: Progressing   Problem: RH KNOWLEDGE DEFICIT Goal: RH STG INCREASE KNOWLEDGE OF DIABETES Description: Patient and spouse will be able to manage DM using educational resources for medications and dietary modification independently Outcome: Progressing Goal: RH STG INCREASE KNOWLEDGE OF HYPERTENSION Description: Patient and spouse will be able to manage HTN using educational resources for medications and dietary modification independently Outcome: Progressing Goal: RH STG INCREASE KNOWLEGDE OF HYPERLIPIDEMIA Description: Patient and spouse will be able to manage HLD using educational resources for medications and dietary modification independently Outcome: Progressing Goal: RH STG INCREASE KNOWLEDGE OF STROKE PROPHYLAXIS Description: Patient and spouse will be able to manage secondary risks using educational resources for medications and dietary modification independently Outcome:  Progressing   "

## 2024-02-16 NOTE — Progress Notes (Signed)
 Physical Therapy Session Note  Patient Details  Name: Marisa [name] MRN: [id number] Date of Birth: [birth year]  Today's Date: 02/16/2024 PT Individual Time: 8954-8841 PT Individual Time Calculation (min): 73 min   Short Term Goals: Week 2:  PT Short Term Goal 1 (Week 2): Pt will transfer supine<>sitting EOB with min A consistantly PT Short Term Goal 2 (Week 2): pt will transfer bed<>chair with LRAD and min A PT Short Term Goal 3 (Week 2): pt will ambulate 68ft with LRAD and max A of 1  Skilled Therapeutic Interventions/Progress Updates:      Pt sitting in w/c and in agreement to therapy treatment. She has no reports of pain but requests assistance to the bathroom to void.   Sit<>stand in [place] with minA with assist for R foot positioning. Able to sit in perched position with CGA as she was transferred onto the toilet where she was continent of bladder void. New clean brief provided, per patient request.   Pt transferred back to her w/c via [place] and then taken to main gym at w/c level.   Focused remainder of session on progressing gait training and initiating stair training.   Pt ambulated 15' + 55' with +2 assist using the EVA walker. Rehab tech in front guiding/managing EVA while PT on stool behind patient to facilitate R hemi gait. Pt needing mod/maxA overall and her AFO sleeve would slide off the posterior leaf. Used additional wrapping to help secure it in place.   Pt ambulated 15' + 60' using 3-muskateers technique with +2 maxA. Pt able to advance and place her R foot for the 1st 30' before needing more assist for managing.   Stair training began using 6 steps and 1 hand rail on her L. Provided demonstration for sequencing to improve safety. Pt navigated these while forward facing with a step-to pattern, L foot leading ascent and R leading descent. Caging R knee for both directions to prevent hyperextension vs buckling. X4 stairs completed with +2 mod/maxA  Pt returned to  her room and she finished session seated in w/c, needs met.   Therapy Documentation Precautions:  Precautions Precautions: Fall Recall of Precautions/Restrictions: Intact Precaution/Restrictions Comments: R dense hemi Restrictions Weight Bearing Restrictions Per Provider Order: No General:      Therapy/Group: Individual Therapy  Sherlean SHAUNNA Perks 02/16/2024, 7:44 AM

## 2024-02-16 NOTE — Progress Notes (Signed)
 Occupational Therapy Session Note  Patient Details  Name: Marisa [name] MRN: [id number] Date of Birth: 1960/03/17  Today's Date: 02/16/2024 OT Individual Time: 9184-9084 OT Individual Time Calculation (min): 60 min    Short Term Goals: Week 2:  OT Short Term Goal 1 (Week 2): Pt will keep RUE in safe place 100% of the time with min questioning cues for 2 consecutive sessions OT Short Term Goal 2 (Week 2): Pt will complete UB dressing with min A OT Short Term Goal 3 (Week 2): Pt will complete toileting with mod A using LRAD  Skilled Therapeutic Interventions/Progress Updates:    Skilled OT intervention with focus on dressing with sit<>stand in [place], bed mobility, RUE AAROM/PROM and TA to increase funcitonal in RUE and increase independence with BADLs. Sit<>stand in [place] with CGA. Pt able to maintain standing with CGA to facilitate pulling pants over hips. Dependent for donning footwear. Pt with trace shoulder flexion but otherwise not active muscle activation.  Saebo for wrist/finger flexion, wrist/finger extension, and elbow flexion. Wrist flexion/extension with postive results. Limited elbow flexion. No adverse reaction.  Saebo Stim One 330 pulse width 35 Hz pulse rate On 8 sec/ off 8 sec Ramp up/ down 2 sec Symmetrical Biphasic wave form  Max intensity at 500 Ohm load  Pt remaine din w/c with all needs within reach. Half lap tray in place.   Therapy Documentation Precautions:  Precautions Precautions: Fall Recall of Precautions/Restrictions: Intact Precaution/Restrictions Comments: R dense hemi Restrictions Weight Bearing Restrictions Per Provider Order: No   Pain:  Pt denies pain this morning   Therapy/Group: Individual Therapy  [name] 02/16/2024, 10:14 AM

## 2024-02-16 NOTE — Progress Notes (Signed)
 Speech Language Pathology Daily Session Note  Patient Details  Name: LAURIANN MILILLO MRN: [id number] Date of Birth: 01/14/1961  Today's Date: 02/16/2024 SLP Individual Time: 1400-1500 SLP Individual Time Calculation (min): 60 min  Short Term Goals: Week 2: SLP Short Term Goal 1 (Week 2): Patient will tolerate regular/thin liquid diet with modI for use of oral clearance strategies. SLP Short Term Goal 2 (Week 2): Patient will attend to functional therapy tasks for 30 minutes with supervision assist. SLP Short Term Goal 3 (Week 2): Patient will recall daily events using external and internal memory aids with supervision assist. SLP Short Term Goal 4 (Week 2): Patient will solve mildly complex functional problems with supervision assist. SLP Short Term Goal 5 (Week 2): Patient will utilize speech intelligibility strategies at the sentence level with supervision assist.  Skilled Therapeutic Interventions:   Pt and husband greeted at bedside for tx targeting cognition and speech production. She was up in her [facility] upon SLP arrival, eager for tx tasks. During initial conversation, she was able to recall recent events/events of the day w/ supervisionA for accuracy w/ details. She also recalled her SLAP speech strategies w/ modA. She continues to utilize the compensatory speech strategies @ modI. She also completed a mildly complex verbal task targeting working memory, problem solving, and abstract thinking. She benefited from only supervisionA for abstract thinking. At the end of tx tasks, she was left in her [facility] w/ the call light within reach. Visitors arrived upon SLP departure as well. Recommend cont ST per POC.   Pain  No pain reported  Therapy/Group: Individual Therapy  Recardo DELENA Mole 02/16/2024, 2:38 PM

## 2024-02-16 NOTE — Progress Notes (Signed)
 "                                                        PROGRESS NOTE   Subjective/Complaints: No new complaints this morning Discussed that CBGs are well controlled, discontinued ISS, she remains on Farxiga  LBM 1/19  ROS: +right sided hemiplegia- continues See HPI,  denies fevers/chills, CP, SOB, abd pain, n/v/d/c   Objective:   No results found.  Recent Labs    02/16/24 0458  WBC 7.9  HGB 11.6*  HCT 35.5*  PLT 271    Recent Labs    02/16/24 0458  NA 140  K 3.4*  CL 104  CO2 26  GLUCOSE 107*  BUN 26*  CREATININE 0.86  CALCIUM  8.9    Intake/Output Summary (Last 24 hours) at 02/16/2024 1317 Last data filed at 02/16/2024 0800 Gross per 24 hour  Intake 1180 ml  Output --  Net 1180 ml        Physical Exam: Vital Signs Blood pressure 123/62, pulse 65, temperature 98 F (36.7 C), temperature source Oral, resp. rate 18, height 5' (1.524 m), weight 105.7 kg, SpO2 96%.  Gen: no distress, sitting in w/c, comfortable HEENT: oral mucosa pink and moist, NCAT Cardio: bradycardic, reg rhythm, no m/r/g appreciated Chest: normal effort, normal rate of breathing, CTAB Abd: soft, non-distended, nontender, +BS throughout Ext: BLE nonpitting edema Psych: pleasant, normal affect, interactive Skin: intact to exposed surfaces Neuro: Alert and oriented x3, 5/5 strength on left side, 0/5 strength throughout right UE, right lower extremity with 3/5 KE and is otherwise 0/5, sensation is intact   Assessment/Plan: 1. Functional deficits which require 3+ hours per day of interdisciplinary therapy in a comprehensive inpatient rehab setting. Physiatrist is providing close team supervision and 24 hour management of active medical problems listed below. Physiatrist and rehab team continue to assess barriers to discharge/monitor patient progress toward functional and medical goals  Care Tool:  Bathing    Body parts bathed by patient: Right arm, Chest, Abdomen, Front perineal area,  Left upper leg, Face, Right upper leg   Body parts bathed by helper: Left arm, Right lower leg, Right upper leg     Bathing assist Assist Level: Minimal Assistance - Patient > 75%     Upper Body Dressing/Undressing Upper body dressing   What is the patient wearing?: Pull over shirt    Upper body assist Assist Level: Contact Guard/Touching assist    Lower Body Dressing/Undressing Lower body dressing      What is the patient wearing?: Incontinence brief, Pants     Lower body assist Assist for lower body dressing: Moderate Assistance - Patient 50 - 74%     Toileting Toileting    Toileting assist Assist for toileting: Maximal Assistance - Patient 25 - 49%     Transfers Chair/bed transfer  Transfers assist     Chair/bed transfer assist level: Minimal Assistance - Patient > 75%     Locomotion Ambulation   Ambulation assist      Assist level: 2 helpers Assistive device: Other (comment) (3-muskateers) Max distance: 58'   Walk 10 feet activity   Assist     Assist level: 2 helpers Assistive device: Other (comment) (3-muskateers)   Walk 50 feet activity   Assist Walk 50 feet with 2 turns activity did not occur:  Safety/medical concerns (weakness, fatigue)  Assist level: 2 helpers Assistive device: Other (comment) (3-muskateers)    Walk 150 feet activity   Assist Walk 150 feet activity did not occur: Safety/medical concerns         Walk 10 feet on uneven surface  activity   Assist Walk 10 feet on uneven surfaces activity did not occur: Safety/medical concerns         Wheelchair     Assist Is the patient using a wheelchair?: Yes Type of Wheelchair: Manual    Wheelchair assist level: Supervision/Verbal cueing Max wheelchair distance: 163ft    Wheelchair 50 feet with 2 turns activity    Assist        Assist Level: Supervision/Verbal cueing   Wheelchair 150 feet activity     Assist      Assist Level: Dependent -  Patient 0%   Blood pressure 123/62, pulse 65, temperature 98 F (36.7 C), temperature source Oral, resp. rate 18, height 5' (1.524 m), weight 105.7 kg, SpO2 96%.  Medical Problem List and Plan: 1. 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 Continue Asa and Plavix  with statin. Follow up with Dr. Chalice in 4 weeks.              -patient may  shower -ELOS/Goals: 14-18 days due to R hemiplegia- Min A hopefully, goals, mod I to supervision for SLP/dysarthria Chart and therapy notes reviewed, continue CIR, discussed patient's progress with therapy Grounds pass ordered Vitamin D3/Metanx/Vitamin B/C complex ordered F/u with Dr. JONELLE or Fidela in clinic within 1 month Would benefit from home health aide upon discharge  Continue CIR, d/c goal 1/29   2.  Antithrombotics: continue Lovenox  40mg  daily; antiplatelet ASA and plavix  daily   3. Pain Management: Tylenol  prn    4. Mood/Behavior/Sleep: LCSW to follow for evaluation and support when available.              -antipsychotic agents: N/A  -trazodone  PRN   5. Neuropsych/cognition: This patient is capable of making decisions on her own behalf.   6. Skin/Wound Care: Routine pressure relief measures.    7. FEN/AKI: encouraged oral hydration, continue vitamins/supplements, continue routine labs.    8. Screening for vitamin D  deficiency: D level 28.1 on 02/06/24, increase D3 to 2,000U daily   9. HTN: BP reviewed and is normal, continue Amlodipine  5mg  daily, d/c chorthalidone 25mg  daily, d/c avapro , continue Bystolic  10mg  daily.  Monitor BP with activity. Stable Vitals:   02/12/24 1345 02/12/24 2009 02/13/24 0600 02/13/24 1511  BP: (!) 105/57 125/61 132/63 117/70   02/13/24 2016 02/14/24 0500 02/14/24 1310 02/14/24 1940  BP: 118/62 (!) 121/59 123/70 (!) 112/53   02/15/24 0549 02/15/24 1524 02/15/24 1957 02/16/24 0503  BP: 115/62 109/65 129/67 123/62    10. DM2: A1c 7.2, Home metformin   held, resumed Farxiga  10mg  daily. D/c ISS  CBG (last 3)  Recent Labs    02/15/24 2055 02/16/24 0552 02/16/24 1208  GLUCAP 137* 103* 108*     11. HLD: Lipitor 80 mg daily, Lipid panel ordered Thursday morning as per patient's request   12. OSA: continue CPAP nightly    13. Left Thyroid Nodule: 2.2 cm thyroid nodule found on CT.  Follow up outpatient for non emergent thyroid US .    14. Class 4 Obesity:  BMI is 47.85 kg/m . Educate on diet and weight loss to promote overall health and mobility. Continue magnesium  supplement   15. Constipation-  will order Miralax  daily that she's taking as well as Dulcolax suppository tonight to help her get back on schedule. - She also wants apple juice. Mag oxide added HS. Continue senokot s 2 tabs nightly. LBM 1/19, d/c prn enema  16. Hypokalemia: klor ordered on 1/19  17. Right hand swelling: US  reviewed and is negative for clot- discussed with patient   LOS: 11 days A FACE TO FACE EVALUATION WAS PERFORMED  Marisa [name] 02/16/2024, 1:17 PM     "

## 2024-02-17 ENCOUNTER — Inpatient Hospital Stay (HOSPITAL_COMMUNITY)

## 2024-02-17 ENCOUNTER — Inpatient Hospital Stay: Payer: Self-pay | Admitting: Internal Medicine

## 2024-02-17 DIAGNOSIS — M7989 Other specified soft tissue disorders: Secondary | ICD-10-CM

## 2024-02-17 LAB — CBC WITH DIFFERENTIAL/PLATELET
Abs Immature Granulocytes: 0.02 K/uL (ref 0.00–0.07)
Basophils Absolute: 0 K/uL (ref 0.0–0.1)
Basophils Relative: 0 %
Eosinophils Absolute: 0.1 K/uL (ref 0.0–0.5)
Eosinophils Relative: 2 %
HCT: 34.7 % — ABNORMAL LOW (ref 36.0–46.0)
Hemoglobin: 11.1 g/dL — ABNORMAL LOW (ref 12.0–15.0)
Immature Granulocytes: 0 %
Lymphocytes Relative: 39 %
Lymphs Abs: 3.1 K/uL (ref 0.7–4.0)
MCH: 22.4 pg — ABNORMAL LOW (ref 26.0–34.0)
MCHC: 32 g/dL (ref 30.0–36.0)
MCV: 70.1 fL — ABNORMAL LOW (ref 80.0–100.0)
Monocytes Absolute: 0.5 K/uL (ref 0.1–1.0)
Monocytes Relative: 6 %
Neutro Abs: 4.2 K/uL (ref 1.7–7.7)
Neutrophils Relative %: 53 %
Platelets: 275 K/uL (ref 150–400)
RBC: 4.95 MIL/uL (ref 3.87–5.11)
RDW: 16.3 % — ABNORMAL HIGH (ref 11.5–15.5)
WBC: 7.9 K/uL (ref 4.0–10.5)
nRBC: 0 % (ref 0.0–0.2)

## 2024-02-17 LAB — GLUCOSE, CAPILLARY
Glucose-Capillary: 110 mg/dL — ABNORMAL HIGH (ref 70–99)
Glucose-Capillary: 90 mg/dL (ref 70–99)
Glucose-Capillary: 102 mg/dL — ABNORMAL HIGH (ref 70–99)
Glucose-Capillary: 112 mg/dL — ABNORMAL HIGH (ref 70–99)

## 2024-02-17 LAB — BASIC METABOLIC PANEL WITH GFR
Anion gap: 9 (ref 5–15)
BUN: 25 mg/dL — ABNORMAL HIGH (ref 8–23)
CO2: 24 mmol/L (ref 22–32)
Calcium: 9.1 mg/dL (ref 8.9–10.3)
Chloride: 109 mmol/L (ref 98–111)
Creatinine, Ser: 0.85 mg/dL (ref 0.44–1.00)
GFR, Estimated: >60 mL/min
Glucose, Bld: 107 mg/dL — ABNORMAL HIGH (ref 70–99)
Potassium: 3.9 mmol/L (ref 3.5–5.1)
Sodium: 141 mmol/L (ref 135–145)

## 2024-02-17 MED ORDER — HYDROCHLOROTHIAZIDE 12.5 MG PO TABS
6.2500 mg | ORAL_TABLET | Freq: Every day | ORAL | Status: DC
  Administered 2024-02-17 – 2024-02-18 (×2): 6.25 mg via ORAL
  Filled 2024-02-17 (×2): qty 1

## 2024-02-17 MED ORDER — VITAMIN D 25 MCG (1000 UNIT) PO TABS
3000.0000 [IU] | ORAL_TABLET | Freq: Every day | ORAL | Status: DC
  Administered 2024-02-18 – 2024-03-02 (×14): 3000 [IU] via ORAL
  Filled 2024-02-17 (×14): qty 3

## 2024-02-17 MED ORDER — POTASSIUM CHLORIDE CRYS ER 10 MEQ PO TBCR
10.0000 meq | EXTENDED_RELEASE_TABLET | Freq: Once | ORAL | Status: AC
  Administered 2024-02-17: 10 meq via ORAL
  Filled 2024-02-17: qty 1

## 2024-02-17 NOTE — Progress Notes (Incomplete)
{  All SLP Notes:[id number]}

## 2024-02-17 NOTE — Progress Notes (Signed)
 Speech Language Pathology Daily Session Note  Patient Details  Name: Marisa [name] MRN: [id number] Date of Birth: January 18, 1961  Today's Date: 02/17/2024 SLP Individual Time: 8554-8471 SLP Individual Time Calculation (min): 43 min  Short Term Goals: Week 2: SLP Short Term Goal 1 (Week 2): Patient will tolerate regular/thin liquid diet with modI for use of oral clearance strategies. SLP Short Term Goal 2 (Week 2): Patient will attend to functional therapy tasks for 30 minutes with supervision assist. SLP Short Term Goal 3 (Week 2): Patient will recall daily events using external and internal memory aids with supervision assist. SLP Short Term Goal 4 (Week 2): Patient will solve mildly complex functional problems with supervision assist. SLP Short Term Goal 5 (Week 2): Patient will utilize speech intelligibility strategies at the sentence level with supervision assist.  Skilled Therapeutic Interventions: Skilled therapy session focused on cognitive goals. Upon entrance, patient on toilet. SLP aided in transfer to [facility] via steady per safety plan. SLP targeted problem solving through hospital navigation task. Patient navigated to cafeteria and gift shop with mod I A. Upon return to room, patient recalled 2/2 locations and 12/15 items independently. Patient recalled remaining 3 items with minA. Patient left in [facility] with alarm set and call bell in reach. Continue POC  Pain None reported   Therapy/Group: Individual Therapy Idaliz Tinkle M.A., CCC-SLP 02/17/2024, 7:46 AM

## 2024-02-17 NOTE — Progress Notes (Signed)
 Occupational Therapy Session Note  Patient Details  Name: Marisa [name] MRN: [id number] Date of Birth: [birth year]  Today's Date: 02/17/2024 OT Individual Time: 8691-8581 OT Individual Time Calculation (min): 70 min    Short Term Goals: Week 2:  OT Short Term Goal 1 (Week 2): Pt will keep RUE in safe place 100% of the time with min questioning cues for 2 consecutive sessions OT Short Term Goal 2 (Week 2): Pt will complete UB dressing with min A OT Short Term Goal 3 (Week 2): Pt will complete toileting with mod A using LRAD  Skilled Therapeutic Interventions/Progress Updates:  Skilled OT intervention completed with focus on ADL retraining, functional transfers, RUE NMR and DC planning. Pt received upright in bed, agreeable to session. No pain reported.  Pt requested to use bathroom. Completed bed mobility with min A for RUE only. Mod A stand pivot > w/c with cues for RLE positioning with pt excited about increased activation in DF! Mpd A squat pivot to toilet using grab bar, mod A for lowering LB clothing with pt noted to have hyperextension in R knee with stance but no formal buckling. Pt incontinent then further continent of urinary void. Min A sit > stand with RLE guard, while pt managed pericare with min A for balance. Max A for donning new brief and min A for donning pants over hips. Mod A stand pivot > w/c. BLE noted to have swelling, but per pt has gone down and TEDs already applied. OT assisted total A for donning R AFO/bilateral shoes.   Issued pt a home measurement sheet to prep for DC planning in which pt had questions about her ability to return home indicating her husband is sickly and doesn't want family to be burdened and also doesn't want to get hurt. Stated her biggest concern is toileting independently. Pt shared she and husband are considering SNF. We discussed what her rehab journey would like if she did IPR > SNF vs IPR > home with OPOT/PT. Discussed benefits of each  and what would be required for each. Pt planned to think about her preferences considering the new info and plan to discuss this in conference.  OT applied saebo stim to RUE wrist/digit extensors for NMR with the following parameters:  330 pulse width 35 Hz pulse rate On 8 sec/ off 8 sec Ramp up/ down 2 sec Symmetrical Biphasic wave form  Max intensity at 500 Ohm load -Applied for 40 mins (unattended) with same parameters -Removed from pt at end of cycle with no adverse skin reaction or irritation noted.  Pt remained seated in w/c, with chair alarm on/activated, and with all needs in reach at end of session.   Therapy Documentation Precautions:  Precautions Precautions: Fall Recall of Precautions/Restrictions: Intact Precaution/Restrictions Comments: R dense hemi Restrictions Weight Bearing Restrictions Per Provider Order: No    Therapy/Group: Individual Therapy  [name], MS, OTR/L  02/17/2024, 3:04 PM

## 2024-02-17 NOTE — Progress Notes (Signed)
 "                                                        PROGRESS NOTE   Subjective/Complaints: No new complaints this morning Asks why she had a stroke, discussed her multiple risk factors, provided lifestyle education  ROS: +right sided hemiplegia- continues See HPI,  denies fevers/chills, CP, SOB, abd pain, n/v/d/c   Objective:   No results found.  Recent Labs    02/16/24 0458 02/17/24 0457  WBC 7.9 7.9  HGB 11.6* 11.1*  HCT 35.5* 34.7*  PLT 271 275    Recent Labs    02/16/24 0458 02/17/24 0457  NA 140 141  K 3.4* 3.9  CL 104 109  CO2 26 24  GLUCOSE 107* 107*  BUN 26* 25*  CREATININE 0.86 0.85  CALCIUM  8.9 9.1    Intake/Output Summary (Last 24 hours) at 02/17/2024 1221 Last data filed at 02/17/2024 0732 Gross per 24 hour  Intake 413 ml  Output --  Net 413 ml        Physical Exam: Vital Signs Blood pressure (!) 126/58, pulse (!) 59, temperature 98 F (36.7 C), temperature source Oral, resp. rate 18, height 5' (1.524 m), weight 105.7 kg, SpO2 98%.  Gen: no distress, sitting in w/c, comfortable HEENT: oral mucosa pink and moist, NCAT Cardio: bradycardic, reg rhythm, no m/r/g appreciated Chest: normal effort, normal rate of breathing, CTAB Abd: soft, non-distended, nontender, +BS throughout Ext: BLE nonpitting edema Psych: pleasant, normal affect, interactive Skin: intact to exposed surfaces Neuro: Alert and oriented x3, 5/5 strength on left side, 0/5 strength throughout right UE, right lower extremity with 3/5 KE and is otherwise 0/5, sensation is intact, stable 1/20   Assessment/Plan: 1. Functional deficits which require 3+ hours per day of interdisciplinary therapy in a comprehensive inpatient rehab setting. Physiatrist is providing close team supervision and 24 hour management of active medical problems listed below. Physiatrist and rehab team continue to assess barriers to discharge/monitor patient progress toward functional and medical  goals  Care Tool:  Bathing    Body parts bathed by patient: Right arm, Chest, Abdomen, Front perineal area, Left upper leg, Face, Right upper leg   Body parts bathed by helper: Left arm, Right lower leg, Right upper leg     Bathing assist Assist Level: Minimal Assistance - Patient > 75%     Upper Body Dressing/Undressing Upper body dressing   What is the patient wearing?: Pull over shirt    Upper body assist Assist Level: Contact Guard/Touching assist    Lower Body Dressing/Undressing Lower body dressing      What is the patient wearing?: Incontinence brief, Pants     Lower body assist Assist for lower body dressing: Moderate Assistance - Patient 50 - 74%     Toileting Toileting    Toileting assist Assist for toileting: Maximal Assistance - Patient 25 - 49%     Transfers Chair/bed transfer  Transfers assist     Chair/bed transfer assist level: Minimal Assistance - Patient > 75%     Locomotion Ambulation   Ambulation assist      Assist level: 2 helpers Assistive device: Other (comment) (3-muskateers) Max distance: 35'   Walk 10 feet activity   Assist     Assist level: 2 helpers Assistive device: Other (comment) (  3-muskateers)   Walk 50 feet activity   Assist Walk 50 feet with 2 turns activity did not occur: Safety/medical concerns (weakness, fatigue)  Assist level: 2 helpers Assistive device: Other (comment) (3-muskateers)    Walk 150 feet activity   Assist Walk 150 feet activity did not occur: Safety/medical concerns         Walk 10 feet on uneven surface  activity   Assist Walk 10 feet on uneven surfaces activity did not occur: Safety/medical concerns         Wheelchair     Assist Is the patient using a wheelchair?: Yes Type of Wheelchair: Manual    Wheelchair assist level: Supervision/Verbal cueing Max wheelchair distance: 129ft    Wheelchair 50 feet with 2 turns activity    Assist        Assist  Level: Supervision/Verbal cueing   Wheelchair 150 feet activity     Assist      Assist Level: Dependent - Patient 0%   Blood pressure (!) 126/58, pulse (!) 59, temperature 98 F (36.7 C), temperature source Oral, resp. rate 18, height 5' (1.524 m), weight 105.7 kg, SpO2 98%.  Medical Problem List and Plan: 1. 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 Continue Asa and Plavix  with statin. Follow up with Dr. Chalice in 4 weeks.              -patient may  shower -ELOS/Goals: 14-18 days due to R hemiplegia- Min A hopefully, goals, mod I to supervision for SLP/dysarthria Chart and therapy notes reviewed, continue CIR, discussed patient's progress with therapy Grounds pass ordered Vitamin D3/Metanx/Vitamin B/C complex ordered F/u with Dr. JONELLE or Fidela in clinic within 1 month Would benefit from home health aide upon discharge  Continue CIR, d/c goal 1/29   2.  Antithrombotics: continue Lovenox  40mg  daily; antiplatelet ASA and plavix  daily   3. Pain Management: Tylenol  prn    4. Mood/Behavior/Sleep: LCSW to follow for evaluation and support when available.              -antipsychotic agents: N/A  -trazodone  PRN   5. Neuropsych/cognition: This patient is capable of making decisions on her own behalf.   6. Skin/Wound Care: Routine pressure relief measures.    7. FEN/AKI: encouraged oral hydration, continue vitamins/supplements, continue routine labs.    8. Screening for vitamin D  deficiency: D level 28.1 on 02/06/24, increase D3 to 2,000U daily   9. HTN: BP reviewed and is normal, amlodipine  d/ced due to leg swelling, d/c chorthalidone 25mg  daily, d/c avapro , continue Bystolic  10mg  daily.  Monitor BP with activity. Hydrochlorothiazide  6.25mg  daily added due to leg swelling Vitals:   02/13/24 1511 02/13/24 2016 02/14/24 0500 02/14/24 1310  BP: 117/70 118/62 (!) 121/59 123/70   02/14/24 1940 02/15/24 0549 02/15/24 1524  02/15/24 1957  BP: (!) 112/53 115/62 109/65 129/67   02/16/24 0503 02/16/24 1428 02/16/24 1947 02/17/24 0502  BP: 123/62 133/66 131/63 (!) 126/58    10. DM2: A1c 7.2, Home metformin  held, resumed Farxiga  10mg  daily. D/c ISS  CBG (last 3)  Recent Labs    02/16/24 2049 02/17/24 0554 02/17/24 1154  GLUCAP 153* 110* 90     11. HLD: Lipitor 80 mg daily, Lipid panel ordered Thursday morning as per patient's request   12. OSA: continue CPAP nightly    13. Left Thyroid Nodule: 2.2 cm thyroid nodule found on CT.  Follow up outpatient for non emergent thyroid US .  14. Class 4 Obesity:  BMI is 47.85 kg/m . Educate on diet and weight loss to promote overall health and mobility. Continue magnesium  supplement   15. Constipation- will order Miralax  daily that she's taking as well as Dulcolax suppository tonight to help her get back on schedule. - She also wants apple juice. Mag oxide added HS. Continue senokot s 2 tabs nightly. LBM 1/19, d/c prn enema, d/c scheduled miralax   16. Hypokalemia: klor ordered on 1/19, kdur ordered 1/20  17. Right hand swelling: US  reviewed and is negative for clot- discussed with patient  18. RLE swelling: Vas US  ordered  LOS: 12 days A FACE TO FACE EVALUATION WAS PERFORMED  Sven SQUIBB Devonda Pequignot 02/17/2024, 12:21 PM     "

## 2024-02-17 NOTE — Progress Notes (Signed)
" °   02/17/24 2243  BiPAP/CPAP/SIPAP  BiPAP/CPAP/SIPAP Pt Type Adult  Reason BIPAP/CPAP not in use Other(comment) (pt states she is independent with cpap and will put it on herself later tonight)    "

## 2024-02-17 NOTE — Plan of Care (Signed)
" °  Problem: Consults Goal: RH STROKE PATIENT EDUCATION Description: See Patient Education module for education specifics  Outcome: Progressing Goal: Nutrition Consult-if indicated Outcome: Progressing Goal: Diabetes Guidelines if Diabetic/Glucose > 140 Description: If diabetic or lab glucose is > 140 mg/dl - Initiate Diabetes/Hyperglycemia Guidelines & Document Interventions  Outcome: Progressing   Problem: RH BOWEL ELIMINATION Goal: RH STG MANAGE BOWEL WITH ASSISTANCE Description: STG Manage Bowel with mod I Assistance. Outcome: Progressing Goal: RH STG MANAGE BOWEL W/MEDICATION W/ASSISTANCE Description: STG Manage Bowel with Medication with mod I Assistance. Outcome: Progressing   Problem: RH BLADDER ELIMINATION Goal: RH STG MANAGE BLADDER WITH ASSISTANCE Description: STG Manage Bladder With toileting Assistance Outcome: Progressing   Problem: RH SAFETY Goal: RH STG ADHERE TO SAFETY PRECAUTIONS W/ASSISTANCE/DEVICE Description: STG Adhere to Safety Precautions With cues Assistance/Device. Outcome: Progressing   Problem: RH KNOWLEDGE DEFICIT Goal: RH STG INCREASE KNOWLEDGE OF DIABETES Description: Patient and spouse will be able to manage DM using educational resources for medications and dietary modification independently Outcome: Progressing Goal: RH STG INCREASE KNOWLEDGE OF HYPERTENSION Description: Patient and spouse will be able to manage HTN using educational resources for medications and dietary modification independently Outcome: Progressing Goal: RH STG INCREASE KNOWLEGDE OF HYPERLIPIDEMIA Description: Patient and spouse will be able to manage HLD using educational resources for medications and dietary modification independently Outcome: Progressing Goal: RH STG INCREASE KNOWLEDGE OF STROKE PROPHYLAXIS Description: Patient and spouse will be able to manage secondary risks using educational resources for medications and dietary modification independently Outcome:  Progressing   "

## 2024-02-17 NOTE — Progress Notes (Signed)
 Right lower extremity venous duplex has been completed.  Results can be found in chart review under CV Proc.  02/17/2024 1:00 PM  Edilia Elden Appl, RVT.

## 2024-02-17 NOTE — Progress Notes (Signed)
 Physical Therapy Session Note  Patient Details  Name: Marisa [name] MRN: [id number] Date of Birth: Jun 13, 1960  Today's Date: 02/17/2024 PT Individual Time: 9153-8985 PT Individual Time Calculation (min): 88 min   Short Term Goals: Week 1:  PT Short Term Goal 1 (Week 1): pt will transfer supine<>sitting EOB with mod A consistantly PT Short Term Goal 1 - Progress (Week 1): Met PT Short Term Goal 2 (Week 1): pt will transfer sit<>stand with LRAD and min A PT Short Term Goal 2 - Progress (Week 1): Met PT Short Term Goal 3 (Week 1): pt will transfer bed<>chair with LRAD and min A PT Short Term Goal 3 - Progress (Week 1): Progressing toward goal Week 2:  PT Short Term Goal 1 (Week 2): Pt will transfer supine<>sitting EOB with min A consistantly PT Short Term Goal 2 (Week 2): pt will transfer bed<>chair with LRAD and min A PT Short Term Goal 3 (Week 2): pt will ambulate 33ft with LRAD and max A of 1  Skilled Therapeutic Interventions/Progress Updates:   Received pt sitting on commode with Stedy in front of her. Pt agreeable to PT treatment and denied any pain during session. Session with emphasis on toileting, functional mobility/transfers, generalized strengthening and endurance, dynamic standing balance/coordination, NMR, and gait training. Performed all stands in [place] with CGA and required max A to pull brief/pants over hips. Performed dependent transfer to [facility] in [place]. Noted significant edema in RLE - donned ted hose dependently with ++ time and encouraged pt to elevate BLE on EOB throughout day. MD arrived for morning rounds and donned shoes, R AFO, and shoe cover dependently. Pt transported to/from room in [facility] dependently for time management purposes.   Donned LiteGait harness seated with max A, then stood with CGA and finished securing harness with +2 assist. Pt stepped onto LiteGait treadmill with max A +2 (total A to manage RLE and cues for up with the good). Secured RUE to handgrip  using ace wrap and worked on gait training for the following time frames: -Trial 1: 3 minutes and 10 seconds at 0. for 4ft -Trial 2: 2 minutes and 45 seconds at 0. for 46ft -Trial 3: 4 minutes and 45 seconds at 0. for 26ft  -Trial 4: 3 minutes and 45 seconds at 0. for 84ft Pt demo ability to intermittently advance RLE throughout gait training, but required increased assist with fatigue. Pt required mod/max A to block knee from buckling/hyperextending and pt demo difficulty maintaining and sustaining R knee extension in stance due to poor sensation/proprioception of RLE, and therefore using LLE to compensate. Pt required extended seated rest breaks in between ambulation trials and continues to demonstrate poor recognition of fatigue onset. Took a few steps backwards to edge of treadmill with mod A for RLE management, then sat into WC. Removed LiteGait harness with min A to stand (+2 to remove harness). Returned to room and reinforced to pt/family working on hamstring strengthening pulling with BLE in WC - cues to advance RLE, LLE, then pull (letting RLE do majority of effort). Concluded session with pt sitting in [facility] with all needs within reach.  Therapy Documentation Precautions:  Precautions Precautions: Fall Recall of Precautions/Restrictions: Intact Precaution/Restrictions Comments: R dense hemi Restrictions Weight Bearing Restrictions Per Provider Order: No  Therapy/Group: Individual Therapy Therisa HERO Zaunegger Therisa Stains PT, DPT 02/17/2024, 6:51 AM

## 2024-02-18 ENCOUNTER — Inpatient Hospital Stay (HOSPITAL_COMMUNITY)

## 2024-02-18 ENCOUNTER — Inpatient Hospital Stay: Admitting: Internal Medicine

## 2024-02-18 LAB — BASIC METABOLIC PANEL WITH GFR
Anion gap: 9 (ref 5–15)
BUN: 19 mg/dL (ref 8–23)
CO2: 26 mmol/L (ref 22–32)
Calcium: 9.2 mg/dL (ref 8.9–10.3)
Chloride: 107 mmol/L (ref 98–111)
Creatinine, Ser: 0.82 mg/dL (ref 0.44–1.00)
GFR, Estimated: >60 mL/min
Glucose, Bld: 120 mg/dL — ABNORMAL HIGH (ref 70–99)
Potassium: 3.9 mmol/L (ref 3.5–5.1)
Sodium: 143 mmol/L (ref 135–145)

## 2024-02-18 LAB — CBC WITH DIFFERENTIAL/PLATELET
Abs Immature Granulocytes: 0.03 K/uL (ref 0.00–0.07)
Basophils Absolute: 0 K/uL (ref 0.0–0.1)
Basophils Relative: 0 %
Eosinophils Absolute: 0.1 K/uL (ref 0.0–0.5)
Eosinophils Relative: 1 %
HCT: 36.1 % (ref 36.0–46.0)
Hemoglobin: 11.5 g/dL — ABNORMAL LOW (ref 12.0–15.0)
Immature Granulocytes: 0 %
Lymphocytes Relative: 38 %
Lymphs Abs: 2.7 K/uL (ref 0.7–4.0)
MCH: 22.3 pg — ABNORMAL LOW (ref 26.0–34.0)
MCHC: 31.9 g/dL (ref 30.0–36.0)
MCV: 70 fL — ABNORMAL LOW (ref 80.0–100.0)
Monocytes Absolute: 0.4 K/uL (ref 0.1–1.0)
Monocytes Relative: 6 %
Neutro Abs: 3.9 K/uL (ref 1.7–7.7)
Neutrophils Relative %: 55 %
Platelets: 276 K/uL (ref 150–400)
RBC: 5.16 MIL/uL — ABNORMAL HIGH (ref 3.87–5.11)
RDW: 16.3 % — ABNORMAL HIGH (ref 11.5–15.5)
WBC: 7.1 K/uL (ref 4.0–10.5)
nRBC: 0 % (ref 0.0–0.2)

## 2024-02-18 LAB — GLUCOSE, CAPILLARY
Glucose-Capillary: 100 mg/dL — ABNORMAL HIGH (ref 70–99)
Glucose-Capillary: 122 mg/dL — ABNORMAL HIGH (ref 70–99)
Glucose-Capillary: 120 mg/dL — ABNORMAL HIGH (ref 70–99)
Glucose-Capillary: 109 mg/dL — ABNORMAL HIGH (ref 70–99)
Glucose-Capillary: 104 mg/dL — ABNORMAL HIGH (ref 70–99)

## 2024-02-18 LAB — MAGNESIUM: Magnesium: 2.1 mg/dL (ref 1.7–2.4)

## 2024-02-18 MED ORDER — DAPAGLIFLOZIN PROPANEDIOL 5 MG PO TABS
5.0000 mg | ORAL_TABLET | Freq: Every day | ORAL | Status: DC
  Filled 2024-02-18: qty 1

## 2024-02-18 MED ORDER — POTASSIUM CHLORIDE CRYS ER 10 MEQ PO TBCR
10.0000 meq | EXTENDED_RELEASE_TABLET | Freq: Once | ORAL | Status: AC
  Administered 2024-02-18: 10 meq via ORAL
  Filled 2024-02-18: qty 1

## 2024-02-18 MED ORDER — IRBESARTAN 75 MG PO TABS
75.0000 mg | ORAL_TABLET | Freq: Once | ORAL | Status: AC
  Administered 2024-02-18: 75 mg via ORAL
  Filled 2024-02-18: qty 1

## 2024-02-18 MED ORDER — HYDROCHLOROTHIAZIDE 12.5 MG PO TABS: 12.5000 mg | ORAL_TABLET | Freq: Every day | ORAL | Status: DC

## 2024-02-18 NOTE — Progress Notes (Signed)
 "                                                        PROGRESS NOTE   Subjective/Complaints: Patient appears more fatigued this morning and notes new left sided facial numbness that she states started yesterday, will order stat CT Head to r/o new stroke  ROS: +right sided hemiplegia- continues See HPI,  denies fevers/chills, CP, SOB, abd pain, n/v/d/c, increased fatigue today   Objective:   VAS US  LOWER EXTREMITY VENOUS (DVT) Result Date: 02/17/2024  Lower Venous DVT Study Patient Name:  Marisa [name]  Date of Exam:   02/17/2024 Medical Rec #: [id number]             Accession #:    [id number] Date of Birth: Aug 30, 1960            Patient Gender: F Patient Age:   64 years Exam Location:  [facility] Procedure:      VAS US  LOWER EXTREMITY VENOUS (DVT) Referring Phys: SVEN ELKS --------------------------------------------------------------------------------  Indications: Swelling, and Edema.  Comparison Study: No prior exam. Performing Technologist: Edilia Elden Appl  Examination Guidelines: A complete evaluation includes B-mode imaging, spectral Doppler, color Doppler, and power Doppler as needed of all accessible portions of each vessel. Bilateral testing is considered an integral part of a complete examination. Limited examinations for reoccurring indications may be performed as noted. The reflux portion of the exam is performed with the patient in reverse Trendelenburg.  +---------+---------------+---------+-----------+----------+--------------+ RIGHT    CompressibilityPhasicitySpontaneityPropertiesThrombus Aging +---------+---------------+---------+-----------+----------+--------------+ CFV      Full           Yes      Yes                                 +---------+---------------+---------+-----------+----------+--------------+ SFJ      Full           Yes      Yes                                  +---------+---------------+---------+-----------+----------+--------------+ FV Prox  Full                                                        +---------+---------------+---------+-----------+----------+--------------+ FV Mid   Full                                                        +---------+---------------+---------+-----------+----------+--------------+ FV DistalFull                                                        +---------+---------------+---------+-----------+----------+--------------+ PFV      Full                                                        +---------+---------------+---------+-----------+----------+--------------+  POP      Full           Yes      Yes                                 +---------+---------------+---------+-----------+----------+--------------+ PTV      Full                                                        +---------+---------------+---------+-----------+----------+--------------+ PERO     Full                                                        +---------+---------------+---------+-----------+----------+--------------+   +----+---------------+---------+-----------+----------+--------------+ LEFTCompressibilityPhasicitySpontaneityPropertiesThrombus Aging +----+---------------+---------+-----------+----------+--------------+ CFV Full           Yes      Yes                                 +----+---------------+---------+-----------+----------+--------------+ SFJ Full           Yes      Yes                                 +----+---------------+---------+-----------+----------+--------------+     Summary: RIGHT: - There is no evidence of deep vein thrombosis in the lower extremity.  - No cystic structure found in the popliteal fossa.  LEFT: - No evidence of common femoral vein obstruction.   *See table(s) above for measurements and observations. Electronically signed by Gaile New MD on  02/17/2024 at 3:16:32 PM.    Final     Recent Labs    02/17/24 0457 02/18/24 0506  WBC 7.9 7.1  HGB 11.1* 11.5*  HCT 34.7* 36.1  PLT 275 276    Recent Labs    02/16/24 0458 02/17/24 0457  NA 140 141  K 3.4* 3.9  CL 104 109  CO2 26 24  GLUCOSE 107* 107*  BUN 26* 25*  CREATININE 0.86 0.85  CALCIUM  8.9 9.1    Intake/Output Summary (Last 24 hours) at 02/18/2024 0941 Last data filed at 02/18/2024 0740 Gross per 24 hour  Intake 596 ml  Output --  Net 596 ml        Physical Exam: Vital Signs Blood pressure (!) 153/85, pulse 68, temperature (!) 97.5 F (36.4 C), temperature source Oral, resp. rate 16, height 5' (1.524 m), weight 105.7 kg, SpO2 99%.  Gen: no distress, sitting in w/c, comfortable HEENT: oral mucosa pink and moist, NCAT Cardio: bradycardic, reg rhythm, no m/r/g appreciated Chest: normal effort, normal rate of breathing, CTAB Abd: soft, non-distended, nontender, +BS throughout Ext: BLE nonpitting edema, improved in RLE Psych: pleasant, normal affect, interactive Skin: intact to exposed surfaces Neuro: Alert and oriented x3, 5/5 strength on left side, 0/5 strength throughout right UE, right lower extremity with 3/5 KE and is otherwise 0/5, sensation is intact, stable 1/21   Assessment/Plan: 1. Functional deficits which require 3+ hours per day of interdisciplinary therapy in a comprehensive  inpatient rehab setting. Physiatrist is providing close team supervision and 24 hour management of active medical problems listed below. Physiatrist and rehab team continue to assess barriers to discharge/monitor patient progress toward functional and medical goals  Care Tool:  Bathing    Body parts bathed by patient: Right arm, Chest, Abdomen, Front perineal area, Left upper leg, Face, Right upper leg   Body parts bathed by helper: Left arm, Right lower leg, Right upper leg     Bathing assist Assist Level: Minimal Assistance - Patient > 75%     Upper Body  Dressing/Undressing Upper body dressing   What is the patient wearing?: Pull over shirt    Upper body assist Assist Level: Contact Guard/Touching assist    Lower Body Dressing/Undressing Lower body dressing      What is the patient wearing?: Incontinence brief, Pants     Lower body assist Assist for lower body dressing: Moderate Assistance - Patient 50 - 74%     Toileting Toileting    Toileting assist Assist for toileting: Maximal Assistance - Patient 25 - 49%     Transfers Chair/bed transfer  Transfers assist     Chair/bed transfer assist level: Minimal Assistance - Patient > 75%     Locomotion Ambulation   Ambulation assist      Assist level: 2 helpers Assistive device: Other (comment) (3-muskateers) Max distance: 86'   Walk 10 feet activity   Assist     Assist level: 2 helpers Assistive device: Other (comment) (3-muskateers)   Walk 50 feet activity   Assist Walk 50 feet with 2 turns activity did not occur: Safety/medical concerns (weakness, fatigue)  Assist level: 2 helpers Assistive device: Other (comment) (3-muskateers)    Walk 150 feet activity   Assist Walk 150 feet activity did not occur: Safety/medical concerns         Walk 10 feet on uneven surface  activity   Assist Walk 10 feet on uneven surfaces activity did not occur: Safety/medical concerns         Wheelchair     Assist Is the patient using a wheelchair?: Yes Type of Wheelchair: Manual    Wheelchair assist level: Supervision/Verbal cueing Max wheelchair distance: 164ft    Wheelchair 50 feet with 2 turns activity    Assist        Assist Level: Supervision/Verbal cueing   Wheelchair 150 feet activity     Assist      Assist Level: Dependent - Patient 0%   Blood pressure (!) 153/85, pulse 68, temperature (!) 97.5 F (36.4 C), temperature source Oral, resp. rate 16, height 5' (1.524 m), weight 105.7 kg, SpO2 99%.  Medical Problem List and  Plan: 1. 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 Continue Asa and Plavix  with statin. Follow up with Dr. Chalice in 4 weeks.              -patient may  shower -ELOS/Goals: 14-18 days due to R hemiplegia- Min A hopefully, goals, mod I to supervision for SLP/dysarthria Chart and therapy notes reviewed, continue CIR, discussed patient's progress with therapy Grounds pass ordered Vitamin D3/Metanx/Vitamin B/C complex ordered F/u with Dr. JONELLE or Fidela in clinic within 1 month Would benefit from home health aide upon discharge  Continue CIR, d/c goal 1/29 Stat Head CT ordered given new left sided facial numbness and increased lethargy   2.  Antithrombotics: continue Lovenox  40mg  daily; antiplatelet ASA and plavix  daily   3.  Pain Management: Tylenol  prn    4. Mood/Behavior/Sleep: LCSW to follow for evaluation and support when available.              -antipsychotic agents: N/A  -trazodone  PRN   5. Neuropsych/cognition: This patient is capable of making decisions on her own behalf.   6. Skin/Wound Care: Routine pressure relief measures.    7. FEN/AKI: encouraged oral hydration, continue vitamins/supplements, continue routine labs.    8. Screening for vitamin D  deficiency: D level 28.1 on 02/06/24, increase D3 to 2,000U daily   9. HTN: BP reviewed and is normal, amlodipine  d/ced due to leg swelling, d/c chorthalidone 25mg  daily, d/c avapro , continue Bystolic  10mg  daily.  Monitor BP with activity. Hydrochlorothiazide  6.25mg  daily added due to leg swelling Vitals:   02/14/24 1940 02/15/24 0549 02/15/24 1524 02/15/24 1957  BP: (!) 112/53 115/62 109/65 129/67   02/16/24 0503 02/16/24 1428 02/16/24 1947 02/17/24 0502  BP: 123/62 133/66 131/63 (!) 126/58   02/17/24 1500 02/17/24 2056 02/18/24 0407 02/18/24 0800  BP: 127/61 129/63 134/67 (!) 153/85    10. DM2: A1c 7.2, Home metformin  held, resumed Farxiga  10mg  daily. D/c ISS  CBG  (last 3)  Recent Labs    02/17/24 1706 02/17/24 2100 02/18/24 0558  GLUCAP 102* 112* 100*     11. HLD: Lipitor 80 mg daily, Lipid panel ordered Thursday morning as per patient's request   12. OSA: continue CPAP nightly    13. Left Thyroid Nodule: 2.2 cm thyroid nodule found on CT.  Follow up outpatient for non emergent thyroid US .    14. Class 4 Obesity:  BMI is 47.85 kg/m . Educate on diet and weight loss to promote overall health and mobility. Continue magnesium  supplement   15. Constipation- will order Miralax  daily that she's taking as well as Dulcolax suppository tonight to help her get back on schedule. - She also wants apple juice. Mag oxide added HS. Continue senokot s 2 tabs nightly. LBM 1/19-messaged nursing to confirm accuracy, d/c prn enema, d/c scheduled miralax   16. Hypokalemia: klor ordered on 1/19, kdur ordered 1/20, add BMP 1/21  17. Right hand swelling: US  reviewed and is negative for clot- discussed with patient  55. RLE swelling: Vas US  ordered- discussed that this was negative for clot  LOS: 13 days A FACE TO FACE EVALUATION WAS PERFORMED  Sven SQUIBB Marisa [name] 02/18/2024, 9:41 AM     "

## 2024-02-18 NOTE — Patient Care Conference (Signed)
 Inpatient RehabilitationTeam Conference and Plan of Care Update Date: 02/18/2024   Time: 11:33 AM    Patient Name: Marisa [name]      Medical Record Number: [id number]  Date of Birth: 1960-03-22 Sex: Female         Room/Bed: 4W20C/4W20C-01 Payor Info: Payor: [employer] / Plan: [employer] / Product Type: *No Product type* /    Admit Date/Time:  02/05/2024  3:46 PM  Primary Diagnosis:  Left basal ganglia embolic stroke [facility])  Hospital Problems: Principal Problem:   Left basal ganglia embolic stroke [facility]) Active Problems:   CVA (cerebral vascular accident) [facility])    Expected Discharge Date: Expected Discharge Date: 02/26/24  Team Members Present: Physician leading conference: Dr. Sven Elks Social Worker Present: Waverly Gentry, LCSW-A Nurse Present: Barnie Ronde, RN PT Present: Therisa Stains, PT OT Present: Lorrayne Fritter, OT SLP Present: Rosina Downy, SLP     Current Status/Progress Goal Weekly Team Focus  Bowel/Bladder   Continent of B/B   Regain and maintain continence of B/B   Timed toileting, assist with toileting as needed    Swallow/Nutrition/ Hydration   regular/thin   modI       ADL's   Mod A UB, LB and toileting   Min A ADLs, CGA transfers   RUE NMR, dynamic standing balance, functional endurance    Mobility   bed mobility mod A, squat<>pivots and sit<>stands min A, gait 30ft with Elyn walker and R AFO +2 assist, 4 6in steps with 1 handrail max A +2, WC mobility 163ft supervision   supervision, CGA gait, min A steps  barriers: R hemiparesis, decreased balance/coordination, motor planning/sequencing    Communication   mild dysarthria   supervision   use of speech intelligibility strategies at the conversational level    Safety/Cognition/ Behavioral Observations  mild deficits   supervision-min assist   mildly complex problem solving, recall of daily events, awareness of errors    Pain   Denies pain   remain pain free   assess and address pain  Q shift and PRN    Skin   skin intact   maintain skin integrity  assess skin Q shift and PRN      Discharge Planning:    Will discharge home with spouse, and children providing 24/7 care and support. Will await therapy follow-up recommendations. Discussion of SNF placement.   Team Discussion: Patient admitted post left basal ganglia CVA with dense right hemiparesis and cognitive deficits with poor endurance. New onset of facial numbness and increased fatigue with edema right lower extremity. MD ordered labs and scans.  Patient on target to meet rehab goals: yes, currently needs mod assist for bed mobility and min assist for sit - stand, squat pivots. Needs mod assist for upper body/lower body care and toileting. Able to ambulate up to 10' using an Elyn walker, wearing a right AFO. Able to manage steps with max assist. Patient needs supervision for speech strategies for complex sentences due to mild cognitive deficits. Goals for discharge set for CGA - min assist overall.  *See Care Plan and progress notes for long and short-term goals.   Revisions to Treatment Plan:  CT scan; negative Doppler/US  right LE   Teaching Needs: Safety, medications, transfers, toileting, etc.   Current Barriers to Discharge: Decreased caregiver support  Possible Resolutions to Barriers: Family education     Medical Summary Current Status: HTN, left basal ganglia infarction, right lower extremity swelling, anemia, type 2 diabetes  Barriers to Discharge: Medical stability  Barriers to Discharge Comments: HTN, left basal ganglia infarction, right lower extremity swelling, anemia, type 2 diabetes Possible Resolutions to [employer] Focus: Avapro  started, stat head CT ordered and was negative for new stroke, HCTZ increased, VAS US  ordered and is negative for clot, Hgb reviewed and is improved, decrease Farxiga  to 5mg  daily   Continued Need for Acute Rehabilitation Level of Care: The patient requires  daily medical management by a physician with specialized training in physical medicine and rehabilitation for the following reasons: Direction of a multidisciplinary physical rehabilitation program to maximize functional independence : Yes Medical management of patient stability for increased activity during participation in an intensive rehabilitation regime.: Yes Analysis of laboratory values and/or radiology reports with any subsequent need for medication adjustment and/or medical intervention. : Yes   I attest that I was present, lead the team conference, and concur with the assessment and plan of the team.   Fredericka Sober B 02/18/2024, 2:46 PM

## 2024-02-18 NOTE — Progress Notes (Addendum)
 Physical Therapy Session Note  Patient Details  Name: Marisa [name] MRN: [id number] Date of Birth: [birth year]  Today's Date: 02/18/2024 PT Individual Time: 0930-1024 PT Individual Time Calculation (min): 54 min  Today's Date: 02/18/2024 PT Missed Time: 21 Minutes Missed Time Reason: CT/MRI  Short Term Goals: Week 1:  PT Short Term Goal 1 (Week 1): pt will transfer supine<>sitting EOB with mod A consistantly PT Short Term Goal 1 - Progress (Week 1): Met PT Short Term Goal 2 (Week 1): pt will transfer sit<>stand with LRAD and min A PT Short Term Goal 2 - Progress (Week 1): Met PT Short Term Goal 3 (Week 1): pt will transfer bed<>chair with LRAD and min A PT Short Term Goal 3 - Progress (Week 1): Progressing toward goal Week 2:  PT Short Term Goal 1 (Week 2): Pt will transfer supine<>sitting EOB with min A consistantly PT Short Term Goal 2 (Week 2): pt will transfer bed<>chair with LRAD and min A PT Short Term Goal 3 (Week 2): pt will ambulate 52ft with LRAD and max A of 1  Skilled Therapeutic Interventions/Progress Updates:   Received pt semi-reclined in bed. Pt more lethargic this morning and reported diaphoresis and increased numbness along L lower jaw - MD/NP/RN notified immediately and called to bedside - stat head CT ordered. Pt denied any new onset weakness in UE/LE and still able to communicate with all providers. Pt requested to wash up - washed face and lower body in bed with setup assist and laboratory arrived for blood draw. Noted improvements in RLE edema - donned ted hose dependently. Pt transferred semi-reclined<>sitting R EOB with HOB elevated and mod A for RLE management and trunk control. Pt performed multiple stands from EOB without AD and min A (supporting RUE and guarding R knee) and removed saturated brief and donned clean one with +2 assist. Performed side steps to R [facility] with +2 assist and transport arrived to take pt to CT. Pt transferred into supine with mod A for  RUE/RLE management and elevated RUE on pillow for edema management. Pt left with transport en route to CT. 21 minutes missed of skilled physical therapy.  Therapy Documentation Precautions:  Precautions Precautions: Fall Recall of Precautions/Restrictions: Intact Precaution/Restrictions Comments: R dense hemi Restrictions Weight Bearing Restrictions Per Provider Order: No  Therapy/Group: Individual Therapy Therisa HERO Zaunegger Therisa Stains PT, DPT 02/18/2024, 6:53 AM

## 2024-02-18 NOTE — Plan of Care (Signed)
 Patient calm and cooperative A&O X3, family at bedside. Patient left with call bell in reach and bed in lowest position.   Problem: Consults Goal: RH STROKE PATIENT EDUCATION Description: See Patient Education module for education specifics  Outcome: Progressing   Problem: RH BOWEL ELIMINATION Goal: RH STG MANAGE BOWEL WITH ASSISTANCE Description: STG Manage Bowel with mod I Assistance. Outcome: Progressing   Problem: RH SAFETY Goal: RH STG ADHERE TO SAFETY PRECAUTIONS W/ASSISTANCE/DEVICE Description: STG Adhere to Safety Precautions With cues Assistance/Device. Outcome: Progressing   Problem: RH KNOWLEDGE DEFICIT Goal: RH STG INCREASE KNOWLEDGE OF DIABETES Description: Patient and spouse will be able to manage DM using educational resources for medications and dietary modification independently Outcome: Progressing Goal: RH STG INCREASE KNOWLEDGE OF HYPERTENSION Description: Patient and spouse will be able to manage HTN using educational resources for medications and dietary modification independently Outcome: Progressing

## 2024-02-18 NOTE — Progress Notes (Signed)
 Speech Language Pathology Daily Session Note  Patient Details  Name: ASHLEYNICOLE MCCLEES MRN: [id number] Date of Birth: 1960/04/27  Today's Date: 02/18/2024 SLP Individual Time: 0730-0828 SLP Individual Time Calculation (min): 58 min  Short Term Goals: Week 2: SLP Short Term Goal 1 (Week 2): Patient will tolerate regular/thin liquid diet with modI for use of oral clearance strategies. SLP Short Term Goal 2 (Week 2): Patient will attend to functional therapy tasks for 30 minutes with supervision assist. SLP Short Term Goal 3 (Week 2): Patient will recall daily events using external and internal memory aids with supervision assist. SLP Short Term Goal 4 (Week 2): Patient will solve mildly complex functional problems with supervision assist. SLP Short Term Goal 5 (Week 2): Patient will utilize speech intelligibility strategies at the sentence level with supervision assist.  Skilled Therapeutic Interventions: SLP conducted skilled therapy session targeting cognitive and communication goals. Patient recalls events completed in prior date's therapy sessions with supervision for detail accuracy. SLP reviewed patient's current medications with patient benefiting from min assist to recall purposes of each medication based on name. SLP provided written handout to promote carryover of information. SLP then facilitated hands-on medication management task where patient organized a BID pillbox with setup assist for access to simulated pill bottles and supervision assist for organization accuracy.  In final minutes of session, reviewed speech intelligibility strategies. Patient endorses that her speech is 45% back to baseline despite ongoing 100% intelligibility. Patient utilized speech intelligibility strategies at the complex sentence level with supervision-min assist. Patient was left in room with call bell in reach and alarm set. SLP will continue to target goals per plan of care.        Pain Pain  Assessment Pain Scale: 0-10 Pain Score: 0-No painNone  Therapy/Group: Individual Therapy  Kadir Azucena, M.A., CCC-SLP  Houa Ackert A Jung Ingerson 02/18/2024, 9:26 AM

## 2024-02-18 NOTE — Progress Notes (Signed)
 Occupational Therapy Session Note  Patient Details  Name: Marisa [name] MRN: [id number] Date of Birth: Jan 17, 1961  Today's Date: 02/18/2024 OT Individual Time: 8694-8584 OT Individual Time Calculation (min): 70 min    Short Term Goals: Week 2:  OT Short Term Goal 1 (Week 2): Pt will keep RUE in safe place 100% of the time with min questioning cues for 2 consecutive sessions OT Short Term Goal 2 (Week 2): Pt will complete UB dressing with min A OT Short Term Goal 3 (Week 2): Pt will complete toileting with mod A using LRAD  Skilled Therapeutic Interventions/Progress Updates:  Skilled OT intervention completed with focus on RUE NMR. Pt received upright in bed, indicating she still felt a little weak but was agreeable to EOB therapy only. No pain reported.  Transitioned EOB with HOB flat and max A for RLE and trunk elevation. Pt able to static sit with mod I. Utilized UE ranger AAROM of RUE including scapular protraction (increased activation from last week!!) and retraction (still very little to none), bicep flexion in gravity eliminated position with OT assisting at elbow (with 2-/5 activation!!!), tricep extension (0/5). Donned saebo to RUE wrist/digit extensors for grasp and release activity with picking up ball, transferring to cup with emphasis also on using bicep flexion to transfer the ball in addition to shoulder adduction in gravity eliminated position. Pt benefited from firm cueing, tapping technique and visualization cues.   Completed 3 sit > stands with min/mod A for advancing hips up close to EOB with R HHA and no R knee buckling noted. Transitioned max A sit > supine > L side lying. Utilized UE ranger for continued AAROM of shoulder flexion (1/5), extension(1/5), bicep flexion/extension. Transitioned min A > supine, max A for sitting EOB in prep for toilet transfer via stedy with daughter (cleared to assist). Pt remained EOB, with and with all needs in reach at end of  session.  Saebo Stim One  Applied for 60 mins (unattended) to the R deltoid for NMR and joint approximation with the following parameters:  330 pulse width 35 Hz pulse rate On 8 sec/ off 8 sec Ramp up/ down 2 sec Symmetrical Biphasic wave form  Max intensity at 500 Ohm load  Removed from pt at end of cycle with no adverse skin reaction or irritation noted.  Therapy Documentation Precautions:  Precautions Precautions: Fall Recall of Precautions/Restrictions: Intact Precaution/Restrictions Comments: R dense hemi Restrictions Weight Bearing Restrictions Per Provider Order: No    Therapy/Group: Individual Therapy  [name], MS, OTR/L  02/18/2024, 2:50 PM

## 2024-02-19 LAB — CBC WITH DIFFERENTIAL/PLATELET
Abs Immature Granulocytes: 0.02 K/uL (ref 0.00–0.07)
Basophils Absolute: 0 K/uL (ref 0.0–0.1)
Basophils Relative: 0 %
Eosinophils Absolute: 0.1 K/uL (ref 0.0–0.5)
Eosinophils Relative: 2 %
HCT: 35.3 % — ABNORMAL LOW (ref 36.0–46.0)
Hemoglobin: 11.4 g/dL — ABNORMAL LOW (ref 12.0–15.0)
Immature Granulocytes: 0 %
Lymphocytes Relative: 42 %
Lymphs Abs: 3.1 K/uL (ref 0.7–4.0)
MCH: 22.5 pg — ABNORMAL LOW (ref 26.0–34.0)
MCHC: 32.3 g/dL (ref 30.0–36.0)
MCV: 69.6 fL — ABNORMAL LOW (ref 80.0–100.0)
Monocytes Absolute: 0.5 K/uL (ref 0.1–1.0)
Monocytes Relative: 6 %
Neutro Abs: 3.6 K/uL (ref 1.7–7.7)
Neutrophils Relative %: 50 %
Platelets: 249 K/uL (ref 150–400)
RBC: 5.07 MIL/uL (ref 3.87–5.11)
RDW: 16.3 % — ABNORMAL HIGH (ref 11.5–15.5)
Smear Review: NORMAL
WBC: 7.4 K/uL (ref 4.0–10.5)
nRBC: 0.3 % — ABNORMAL HIGH (ref 0.0–0.2)

## 2024-02-19 LAB — BASIC METABOLIC PANEL WITH GFR
Anion gap: 10 (ref 5–15)
BUN: 18 mg/dL (ref 8–23)
CO2: 23 mmol/L (ref 22–32)
Calcium: 9 mg/dL (ref 8.9–10.3)
Chloride: 107 mmol/L (ref 98–111)
Creatinine, Ser: 0.88 mg/dL (ref 0.44–1.00)
GFR, Estimated: >60 mL/min
Glucose, Bld: 88 mg/dL (ref 70–99)
Potassium: 3.6 mmol/L (ref 3.5–5.1)
Sodium: 140 mmol/L (ref 135–145)

## 2024-02-19 LAB — GLUCOSE, CAPILLARY
Glucose-Capillary: 97 mg/dL (ref 70–99)
Glucose-Capillary: 117 mg/dL — ABNORMAL HIGH (ref 70–99)
Glucose-Capillary: 129 mg/dL — ABNORMAL HIGH (ref 70–99)
Glucose-Capillary: 113 mg/dL — ABNORMAL HIGH (ref 70–99)

## 2024-02-19 MED ORDER — HYDROCHLOROTHIAZIDE 12.5 MG PO TABS
6.2500 mg | ORAL_TABLET | Freq: Every day | ORAL | Status: DC
  Administered 2024-02-19 – 2024-02-27 (×9): 6.25 mg via ORAL
  Filled 2024-02-19 (×9): qty 1

## 2024-02-19 MED ORDER — MAGNESIUM OXIDE -MG SUPPLEMENT 400 (240 MG) MG PO TABS
400.0000 mg | ORAL_TABLET | Freq: Every day | ORAL | Status: DC
  Administered 2024-02-19: 400 mg via ORAL
  Filled 2024-02-19: qty 1

## 2024-02-19 MED ORDER — POTASSIUM CHLORIDE CRYS ER 20 MEQ PO TBCR
40.0000 meq | EXTENDED_RELEASE_TABLET | Freq: Once | ORAL | Status: AC
  Administered 2024-02-19: 40 meq via ORAL
  Filled 2024-02-19: qty 2

## 2024-02-19 NOTE — Progress Notes (Signed)
 "                                                        PROGRESS NOTE   Subjective/Complaints: Energy is much improved today Discussed yesterday that CT scan is stable BP reviewed and is stable Leg swelling has imrovied  ROS: +right sided hemiplegia- continues See HPI,  denies fevers/chills, CP, SOB, abd pain, n/v/d/c, fatigue has decreased   Objective:   CT HEAD WO CONTRAST ( ) Result Date: 02/18/2024 EXAM: CT HEAD WITHOUT CONTRAST 02/18/2024 10:41:30 AM TECHNIQUE: CT of the head was performed without the administration of intravenous contrast. Automated exposure control, iterative reconstruction, and/or weight based adjustment of the mA/kV was utilized to reduce the radiation dose to as low as reasonably achievable. COMPARISON: Brain MRI 01/29/2024 and CT head 01/28/2024. CLINICAL HISTORY: 64 year old female with new left-sided facial numbness and recent left corona radiata/lentiform infarct. FINDINGS: BRAIN AND VENTRICLES: No acute hemorrhage. No evidence of acute infarct. No hydrocephalus. No extra-axial collection. No mass effect or midline shift. Brain volume is stable and within normal limits for age. Expected evolution of left lentiform and corona radiata infarct with hypodensity, no hemorrhage or mass effect. Small but circumscribed hypodensity along the anterior superior right thalamus on series 2 image 13 is not identified previously. Age indeterminate small vessel disease suspected in the right thalamus. White matter differentiation is stable and within normal limits. No suspicious intracranial vascular hyperdensity. ORBITS: No acute abnormality. SINUSES: No acute abnormality. SOFT TISSUES AND SKULL: No acute soft tissue abnormality. No skull fracture. IMPRESSION: 1. Suspicion of age-indeterminate small vessel disease in the right thalamus. 2. Otherwise expected evolution of recent left lentiform and corona radiata infarct without hemorrhage or mass effect. Electronically signed by:  Helayne Hurst MD 02/18/2024 10:57 AM EST RP Workstation: [id number]   VAS US  LOWER EXTREMITY VENOUS (DVT) Result Date: 02/17/2024  Lower Venous DVT Study Patient Name:  Marisa [name]  Date of Exam:   02/17/2024 Medical Rec #: [id number]             Accession #:    [id number] Date of Birth: 24-Feb-1960            Patient Gender: F Patient Age:   63 years Exam Location:  [facility] Procedure:      VAS US  LOWER EXTREMITY VENOUS (DVT) Referring Phys: Marisa ELKS --------------------------------------------------------------------------------  Indications: Swelling, and Edema.  Comparison Study: No prior exam. Performing Technologist: Edilia Elden Appl  Examination Guidelines: A complete evaluation includes B-mode imaging, spectral Doppler, color Doppler, and power Doppler as needed of all accessible portions of each vessel. Bilateral testing is considered an integral part of a complete examination. Limited examinations for reoccurring indications may be performed as noted. The reflux portion of the exam is performed with the patient in reverse Trendelenburg.  +---------+---------------+---------+-----------+----------+--------------+ RIGHT    CompressibilityPhasicitySpontaneityPropertiesThrombus Aging +---------+---------------+---------+-----------+----------+--------------+ CFV      Full           Yes      Yes                                 +---------+---------------+---------+-----------+----------+--------------+ SFJ      Full           Yes  Yes                                 +---------+---------------+---------+-----------+----------+--------------+ FV Prox  Full                                                        +---------+---------------+---------+-----------+----------+--------------+ FV Mid   Full                                                        +---------+---------------+---------+-----------+----------+--------------+ FV DistalFull                                                         +---------+---------------+---------+-----------+----------+--------------+ PFV      Full                                                        +---------+---------------+---------+-----------+----------+--------------+ POP      Full           Yes      Yes                                 +---------+---------------+---------+-----------+----------+--------------+ PTV      Full                                                        +---------+---------------+---------+-----------+----------+--------------+ PERO     Full                                                        +---------+---------------+---------+-----------+----------+--------------+   +----+---------------+---------+-----------+----------+--------------+ LEFTCompressibilityPhasicitySpontaneityPropertiesThrombus Aging +----+---------------+---------+-----------+----------+--------------+ CFV Full           Yes      Yes                                 +----+---------------+---------+-----------+----------+--------------+ SFJ Full           Yes      Yes                                 +----+---------------+---------+-----------+----------+--------------+     Summary: RIGHT: - There is no evidence of deep vein thrombosis in the lower extremity.  - No cystic structure found in the popliteal fossa.  LEFT: -  No evidence of common femoral vein obstruction.   *See table(s) above for measurements and observations. Electronically signed by Gaile New MD on 02/17/2024 at 3:16:32 PM.    Final     Recent Labs    02/18/24 0506 02/19/24 0545  WBC 7.1 7.4  HGB 11.5* 11.4*  HCT 36.1 35.3*  PLT 276 249    Recent Labs    02/18/24 0952 02/19/24 0545  NA 143 140  K 3.9 3.6  CL 107 107  CO2 26 23  GLUCOSE 120* 88  BUN 19 18  CREATININE 0.82 0.88  CALCIUM  9.2 9.0    Intake/Output Summary (Last 24 hours) at 02/19/2024 1042 Last data filed at  02/18/2024 1311 Gross per 24 hour  Intake 236 ml  Output --  Net 236 ml        Physical Exam: Vital Signs Blood pressure 131/63, pulse 63, temperature 98.7 F (37.1 C), temperature source Oral, resp. rate 18, height 5' (1.524 m), weight 105.7 kg, SpO2 99%.  Gen: no distress, sitting in w/c, comfortable HEENT: oral mucosa pink and moist, NCAT Cardio: bradycardic, reg rhythm, no m/r/g appreciated Chest: normal effort, normal rate of breathing, CTAB Abd: soft, non-distended, nontender, +BS throughout Ext: BLE nonpitting edema, improved in RLE Psych: pleasant, normal affect, interactive Skin: intact to exposed surfaces Neuro: Alert and oriented x3, 5/5 strength on left side, 0/5 strength throughout right UE, right lower extremity with 3/5 KE and is otherwise 0/5, sensation is intact, stable 1/22   Assessment/Plan: 1. Functional deficits which require 3+ hours per day of interdisciplinary therapy in a comprehensive inpatient rehab setting. Physiatrist is providing close team supervision and 24 hour management of active medical problems listed below. Physiatrist and rehab team continue to assess barriers to discharge/monitor patient progress toward functional and medical goals  Care Tool:  Bathing    Body parts bathed by patient: Right arm, Chest, Abdomen, Front perineal area, Left upper leg, Face, Right upper leg   Body parts bathed by helper: Left arm, Right lower leg, Right upper leg     Bathing assist Assist Level: Minimal Assistance - Patient > 75%     Upper Body Dressing/Undressing Upper body dressing   What is the patient wearing?: Pull over shirt    Upper body assist Assist Level: Contact Guard/Touching assist    Lower Body Dressing/Undressing Lower body dressing      What is the patient wearing?: Incontinence brief, Pants     Lower body assist Assist for lower body dressing: Moderate Assistance - Patient 50 - 74%     Toileting Toileting    Toileting  assist Assist for toileting: Maximal Assistance - Patient 25 - 49%     Transfers Chair/bed transfer  Transfers assist     Chair/bed transfer assist level: Minimal Assistance - Patient > 75%     Locomotion Ambulation   Ambulation assist      Assist level: 2 helpers Assistive device: Other (comment) (3-muskateers) Max distance: 58'   Walk 10 feet activity   Assist     Assist level: 2 helpers Assistive device: Other (comment) (3-muskateers)   Walk 50 feet activity   Assist Walk 50 feet with 2 turns activity did not occur: Safety/medical concerns (weakness, fatigue)  Assist level: 2 helpers Assistive device: Other (comment) (3-muskateers)    Walk 150 feet activity   Assist Walk 150 feet activity did not occur: Safety/medical concerns         Walk 10 feet on uneven surface  activity   Assist Walk 10 feet on uneven surfaces activity did not occur: Safety/medical concerns         Wheelchair     Assist Is the patient using a wheelchair?: Yes Type of Wheelchair: Manual    Wheelchair assist level: Supervision/Verbal cueing Max wheelchair distance: 159ft    Wheelchair 50 feet with 2 turns activity    Assist        Assist Level: Supervision/Verbal cueing   Wheelchair 150 feet activity     Assist      Assist Level: Dependent - Patient 0%   Blood pressure 131/63, pulse 63, temperature 98.7 F (37.1 C), temperature source Oral, resp. rate 18, height 5' (1.524 m), weight 105.7 kg, SpO2 99%.  Medical Problem List and Plan: 1. 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 Continue Asa and Plavix  with statin. Follow up with Dr. Chalice in 4 weeks.              -patient may  shower -ELOS/Goals: 14-18 days due to R hemiplegia- Min A hopefully, goals, mod I to supervision for SLP/dysarthria Chart and therapy notes reviewed, continue CIR, discussed patient's progress with  therapy Grounds pass ordered Vitamin D3/Metanx/Vitamin B/C complex ordered F/u with Dr. JONELLE or Fidela in clinic within 1 month Would benefit from home health aide upon discharge  Continue CIR, d/c goal 1/29   2.  Antithrombotics: continue Lovenox  40mg  daily; antiplatelet ASA and plavix  daily   3. Pain Management: Tylenol  prn    4. Mood/Behavior/Sleep: LCSW to follow for evaluation and support when available.              -antipsychotic agents: N/A  -trazodone  PRN   5. Neuropsych/cognition: This patient is capable of making decisions on her own behalf.   6. Skin/Wound Care: Routine pressure relief measures.    7. FEN/AKI: encouraged oral hydration, continue vitamins/supplements, continue routine labs.    8. Screening for vitamin D  deficiency: D level 28.1 on 02/06/24, increase D3 to 2,000U daily   9. HTN: BP reviewed and is normal, amlodipine  d/ced due to leg swelling, d/c chorthalidone 25mg  daily, d/c avapro , continue Bystolic  10mg  daily.  Monitor BP with activity. Hydrochlorothiazide  6.25mg  daily added due to leg swelling Vitals:   02/16/24 1428 02/16/24 1947 02/17/24 0502 02/17/24 1500  BP: 133/66 131/63 (!) 126/58 127/61   02/17/24 2056 02/18/24 0407 02/18/24 0800 02/18/24 1011  BP: 129/63 134/67 (!) 153/85 (!) 150/90   02/18/24 1108 02/18/24 1248 02/18/24 2020 02/19/24 0552  BP: 138/71 (!) 154/68 130/65 131/63    10. DM2: A1c 7.2, Home metformin  held, d/c Farxiga . D/c ISS  CBG (last 3)  Recent Labs    02/18/24 1631 02/18/24 2046 02/19/24 0545  GLUCAP 109* 104* 97     11. HLD: continue Lipitor 80 mg daily, discussed that repeat lipid panel shows improved LDL and total cholesterol and worse HDL   12. OSA: continue CPAP nightly    13. Left Thyroid Nodule: 2.2 cm thyroid nodule found on CT.  Follow up outpatient for non emergent thyroid US .    14. Class 4 Obesity:  BMI is 47.85 kg/m . Educate on diet and weight loss to promote overall health and mobility. Continue  magnesium  supplement   15. Constipation- will order Miralax  daily that she's taking as well as Dulcolax suppository tonight to help her get back on schedule. - She also wants apple juice. Mag oxide added HS. Continue senokot s 2  tabs nightly. LBM 1/19-messaged nursing to confirm accuracy, d/c prn enema, d/c scheduled miralax   16. Hypokalemia: klor ordered on 1/19, kdur ordered 1/20, 40 klor ordered 1/22  17. Right hand swelling: US  reviewed and is negative for clot- discussed with patient  18. RLE swelling: Vas US  ordered- discussed that this was negative for clot, discussed that swelling has improved after discontinuation of amlodipine   LOS: 14 days A FACE TO FACE EVALUATION WAS PERFORMED  Marisa [name] 02/19/2024, 10:42 AM     "

## 2024-02-19 NOTE — Progress Notes (Signed)
 Occupational Therapy Session Note  Patient Details  Name: Marisa [name] MRN: [id number] Date of Birth: Sep 25, 1960  Today's Date: 02/19/2024 OT Individual Time: 1404-1505 OT Individual Time Calculation (min): 61 min    Short Term Goals: Week 2:  OT Short Term Goal 1 (Week 2): Pt will keep RUE in safe place 100% of the time with min questioning cues for 2 consecutive sessions OT Short Term Goal 2 (Week 2): Pt will complete UB dressing with min A OT Short Term Goal 3 (Week 2): Pt will complete toileting with mod A using LRAD  Skilled Therapeutic Interventions/Progress Updates:  Pt greeted seated in w/c, pt waiting on nursing staff to assist with toileting, pt requested to use stedy for toileting d/t urgency  Transfers/bed mobility/functional mobility:  Pt able to stand to stedy with CGA. Dependent transfers provided from stedy.    ADLs:  Transfers: dependent transfer provided in stedy to [facility] over toilet. Pt able to stand to stedy with CGA. Did recommend pt place LUE over RUE on bar of stedy.  Toileting: pt with continent urine void needing MODA for 3/3 toileting tasks. Pt did assist with pericare and was able to cleanse anterior periarea from standing with set- up of wash cloth.     NMR:  Donned saebo estim on dorsal aspect of pts R wrist to promote NMRE to RUE. Channel set to level 14 for 8 mins with pt working on wrist extension. Also, Donned NMES on palmar aspect of pts R wrist to facilitate improved wrist flexion, channel set to level 19 for 8 mins. Pt tolerated estim with no adverse reactions and no reports of pain.   Worked on active scapular protraction/retraction with RUE placed on friction reducing device to achieve increased ROM, pt with 2-/5 active ROM with closed chain movement.     Ended session with pt seated in w/c with all needs within reach, daughter present who is checked off on assisting pt with stedy transfers.        Therapy Documentation Precautions:   Precautions Precautions: Fall Recall of Precautions/Restrictions: Intact Precaution/Restrictions Comments: R dense hemi Restrictions Weight Bearing Restrictions Per Provider Order: No  Pain: No pain    Therapy/Group: Individual Therapy  [name] 02/19/2024, 3:23 PM

## 2024-02-19 NOTE — Progress Notes (Signed)
 Physical Therapy Session Note  Patient Details  Name: Marisa [name] MRN: [id number] Date of Birth: Feb 16, 1960  Today's Date: 02/19/2024 PT Individual Time: 0731-0811 PT Individual Time Calculation (min): 40 min   Short Term Goals: Week 1:  PT Short Term Goal 1 (Week 1): pt will transfer supine<>sitting EOB with mod A consistantly PT Short Term Goal 1 - Progress (Week 1): Met PT Short Term Goal 2 (Week 1): pt will transfer sit<>stand with LRAD and min A PT Short Term Goal 2 - Progress (Week 1): Met PT Short Term Goal 3 (Week 1): pt will transfer bed<>chair with LRAD and min A PT Short Term Goal 3 - Progress (Week 1): Progressing toward goal Week 2:  PT Short Term Goal 1 (Week 2): Pt will transfer supine<>sitting EOB with min A consistantly PT Short Term Goal 2 (Week 2): pt will transfer bed<>chair with LRAD and min A PT Short Term Goal 3 (Week 2): pt will ambulate 32ft with LRAD and max A of 1  Skilled Therapeutic Interventions/Progress Updates:   Received pt semi-reclined in bed, pt agreeable to PT treatment, and denied any pain during session. Session with emphasis on functional mobility/transfers, generalized strengthening and endurance, and D/C planning. Donned bilateral ted hose dependently for edema management and pt transferred semi-reclined<>sitting R EOB with HOB elevated and use of bedrails with min A for RUE/RLE management. Pt requesteing to bathe with daughter; therefore just donned non-skid socks, then performed stand<>pivot into WC with min A. MD arrvied for morning rounds and pt sat in WC at sink and washed face with setup assist. Set pt up to eat breakfast - added lemon to pt's water and pt able to open her own banana with min cues. RN present to administer medication and discussed D/C plan of SNF vs home. Pt will have help from husband and daughter, Lynnea, and discussed recommendation for home from [facility] level for transfers only, then OPPT/OPOT to continue working on strength  - pt in agreement. Plan to try to schedule WC evaluation on Tuesday of next week. Concluded session with pt sitting in [facility] with all needs within reach.  Therapy Documentation Precautions:  Precautions Precautions: Fall Recall of Precautions/Restrictions: Intact Precaution/Restrictions Comments: R dense hemi Restrictions Weight Bearing Restrictions Per Provider Order: No  Therapy/Group: Individual Therapy Therisa HERO Zaunegger Therisa Stains PT, DPT 02/19/2024, 6:56 AM

## 2024-02-19 NOTE — Plan of Care (Signed)
" °  Problem: Consults Goal: RH STROKE PATIENT EDUCATION Description: See Patient Education module for education specifics  Outcome: Progressing Goal: Nutrition Consult-if indicated Outcome: Progressing Goal: Diabetes Guidelines if Diabetic/Glucose > 140 Description: If diabetic or lab glucose is > 140 mg/dl - Initiate Diabetes/Hyperglycemia Guidelines & Document Interventions  Outcome: Progressing   Problem: RH BOWEL ELIMINATION Goal: RH STG MANAGE BOWEL WITH ASSISTANCE Description: STG Manage Bowel with mod I Assistance. Outcome: Progressing Goal: RH STG MANAGE BOWEL W/MEDICATION W/ASSISTANCE Description: STG Manage Bowel with Medication with mod I Assistance. Outcome: Progressing   Problem: RH BLADDER ELIMINATION Goal: RH STG MANAGE BLADDER WITH ASSISTANCE Description: STG Manage Bladder With toileting Assistance Outcome: Progressing   Problem: RH SAFETY Goal: RH STG ADHERE TO SAFETY PRECAUTIONS W/ASSISTANCE/DEVICE Description: STG Adhere to Safety Precautions With cues Assistance/Device. Outcome: Progressing   Problem: RH KNOWLEDGE DEFICIT Goal: RH STG INCREASE KNOWLEDGE OF DIABETES Description: Patient and spouse will be able to manage DM using educational resources for medications and dietary modification independently Outcome: Progressing Goal: RH STG INCREASE KNOWLEDGE OF HYPERTENSION Description: Patient and spouse will be able to manage HTN using educational resources for medications and dietary modification independently Outcome: Progressing Goal: RH STG INCREASE KNOWLEGDE OF HYPERLIPIDEMIA Description: Patient and spouse will be able to manage HLD using educational resources for medications and dietary modification independently Outcome: Progressing Goal: RH STG INCREASE KNOWLEDGE OF STROKE PROPHYLAXIS Description: Patient and spouse will be able to manage secondary risks using educational resources for medications and dietary modification independently Outcome:  Progressing   "

## 2024-02-19 NOTE — Group Note (Addendum)
 Patient Details Name: Marisa [name] MRN: [id number] DOB: February 17, 1960 Today's Date: 02/19/2024  Time Calculation: OT Group Time Calculation OT Group Start Time: 1100 OT Group Stop Time: 1130 OT Group Time Calculation (min): 30 min  OT Missed Time: 30 min (toileting)    Group Description: Dance Group: Pt participated in dance group with an emphasis on social interaction, motor planning, increasing overall activity tolerance and bimanual tasks. All songs were selected by group members. Dance moves included AROM of BUE/BLE gross motor movements with an emphasis on building functional endurance.   Individual level documentation: Patient completed group from sitting level. Patient needed min to complete various dance moves with cues for R U/LE positioning.  Patient needed MIN modifications during group. Pt requesting to leave group early to use restroom. Missed 30 min skilled OT treatment. Will attempt to make up time as Pt's status and schedule allow .   Pain:  0/10  Precautions:  Falls  Katheryn SHAUNNA Mines 02/19/2024, 12:22 PM

## 2024-02-19 NOTE — Progress Notes (Signed)
 Patient ID: Marisa [name], female   DOB: 29-Dec-1960, 64 y.o.   MRN: [id number]  Have reviewed team conference with pt and family. Both aware and agreeable with targeted d/c date of 01/29 and goals of Supervision/Verbal cueing.  Patient is agreeable to discharge home  WC level with OP PT/OT.   Patient demographics emailed to Damien Sjogren at [email or web address] ([email or web address]) for review.

## 2024-02-19 NOTE — Progress Notes (Signed)
 Speech Language Pathology Daily Session Note  Patient Details  Name: Marisa [name] MRN: [id number] Date of Birth: Nov 13, 1960  Today's Date: 02/19/2024 SLP Individual Time: 9169-9084 SLP Individual Time Calculation (min): 45 min  Short Term Goals: Week 2: SLP Short Term Goal 1 (Week 2): Patient will tolerate regular/thin liquid diet with modI for use of oral clearance strategies. SLP Short Term Goal 2 (Week 2): Patient will attend to functional therapy tasks for 30 minutes with supervision assist. SLP Short Term Goal 3 (Week 2): Patient will recall daily events using external and internal memory aids with supervision assist. SLP Short Term Goal 4 (Week 2): Patient will solve mildly complex functional problems with supervision assist. SLP Short Term Goal 5 (Week 2): Patient will utilize speech intelligibility strategies at the sentence level with supervision assist.  Skilled Therapeutic Interventions: SLP conducted skilled therapy session targeting cognitive communication goals. Patient utilized speech intelligibility strategies at the complex sentence level with supervision. Patient then completed mildly complex finance management task with supervision-min assist. Patient recalled events from previous day with supervision assist. Patient was left in room with call bell in reach and alarm set. SLP will continue to target goals per plan of care.      Pain  None  Therapy/Group: Individual Therapy  Rosina Downy, M.A., CCC-SLP  Chakara Bognar A Suan Pyeatt 02/19/2024, 9:44 AM

## 2024-02-20 LAB — BASIC METABOLIC PANEL WITH GFR
Anion gap: 11 (ref 5–15)
BUN: 18 mg/dL (ref 8–23)
CO2: 22 mmol/L (ref 22–32)
Calcium: 9 mg/dL (ref 8.9–10.3)
Chloride: 106 mmol/L (ref 98–111)
Creatinine, Ser: 0.73 mg/dL (ref 0.44–1.00)
GFR, Estimated: >60 mL/min
Glucose, Bld: 97 mg/dL (ref 70–99)
Potassium: 3.5 mmol/L (ref 3.5–5.1)
Sodium: 138 mmol/L (ref 135–145)

## 2024-02-20 LAB — GLUCOSE, CAPILLARY: Glucose-Capillary: 94 mg/dL (ref 70–99)

## 2024-02-20 LAB — MAGNESIUM: Magnesium: 2.1 mg/dL (ref 1.7–2.4)

## 2024-02-20 MED ORDER — MAGNESIUM OXIDE -MG SUPPLEMENT 400 (240 MG) MG PO TABS
800.0000 mg | ORAL_TABLET | Freq: Every day | ORAL | Status: DC
  Administered 2024-02-20 – 2024-02-23 (×4): 800 mg via ORAL
  Filled 2024-02-20 (×4): qty 2

## 2024-02-20 MED ORDER — ENOXAPARIN SODIUM 60 MG/0.6ML IJ SOSY
50.0000 mg | PREFILLED_SYRINGE | INTRAMUSCULAR | Status: DC
  Administered 2024-02-21 – 2024-03-01 (×10): 50 mg via SUBCUTANEOUS
  Filled 2024-02-20 (×10): qty 0.6

## 2024-02-20 NOTE — Plan of Care (Signed)
" °  Problem: Consults Goal: RH STROKE PATIENT EDUCATION Description: See Patient Education module for education specifics  Outcome: Progressing Goal: Nutrition Consult-if indicated Outcome: Progressing Goal: Diabetes Guidelines if Diabetic/Glucose > 140 Description: If diabetic or lab glucose is > 140 mg/dl - Initiate Diabetes/Hyperglycemia Guidelines & Document Interventions  Outcome: Progressing   Problem: RH BOWEL ELIMINATION Goal: RH STG MANAGE BOWEL WITH ASSISTANCE Description: STG Manage Bowel with mod I Assistance. Outcome: Progressing Goal: RH STG MANAGE BOWEL W/MEDICATION W/ASSISTANCE Description: STG Manage Bowel with Medication with mod I Assistance. Outcome: Progressing   Problem: RH BLADDER ELIMINATION Goal: RH STG MANAGE BLADDER WITH ASSISTANCE Description: STG Manage Bladder With toileting Assistance Outcome: Progressing   Problem: RH SAFETY Goal: RH STG ADHERE TO SAFETY PRECAUTIONS W/ASSISTANCE/DEVICE Description: STG Adhere to Safety Precautions With cues Assistance/Device. Outcome: Progressing   Problem: RH KNOWLEDGE DEFICIT Goal: RH STG INCREASE KNOWLEDGE OF DIABETES Description: Patient and spouse will be able to manage DM using educational resources for medications and dietary modification independently Outcome: Progressing Goal: RH STG INCREASE KNOWLEDGE OF HYPERTENSION Description: Patient and spouse will be able to manage HTN using educational resources for medications and dietary modification independently Outcome: Progressing Goal: RH STG INCREASE KNOWLEGDE OF HYPERLIPIDEMIA Description: Patient and spouse will be able to manage HLD using educational resources for medications and dietary modification independently Outcome: Progressing Goal: RH STG INCREASE KNOWLEDGE OF STROKE PROPHYLAXIS Description: Patient and spouse will be able to manage secondary risks using educational resources for medications and dietary modification independently Outcome:  Progressing   "

## 2024-02-20 NOTE — Progress Notes (Signed)
 Occupational Therapy Session Note  Patient Details  Name: Marisa [name] MRN: [id number] Date of Birth: 05/29/1960  Today's Date: 02/20/2024 OT Individual Time: 8944-8794 OT Individual Time Calculation (min): 70 min    Short Term Goals: Week 2:  OT Short Term Goal 1 (Week 2): Pt will keep RUE in safe place 100% of the time with min questioning cues for 2 consecutive sessions OT Short Term Goal 2 (Week 2): Pt will complete UB dressing with min A OT Short Term Goal 3 (Week 2): Pt will complete toileting with mod A using LRAD  Skilled Therapeutic Interventions/Progress Updates:  Skilled OT intervention completed with focus on RUE NMR, functional transfers. Pt received seated in w/c, agreeable to session. No pain reported.  Pt requested to work on her RUE. Transported dependently in w/c <> gym for time. Initial min A squat pivot > EOM, but pt's RLE went into extension (unsure of spasticity) and insufficient hip clearance with brake extender dislodging on w/c, therefore required +2 mod A to finish clearing hips for safety and fall prevention. Pt does attempt to stand frequently without RLE fully planted on floor and required cueing more than once to position RLE prior to transfer/movement during session.   Required min A for scooting > middle of mat with OT stabilizing RLE in bridge position for pt to boost hips (did this several times). Mod/max roll > L. Unable to locate mat table for AAROM, therefore utilized ball for AAROM and visual feedback for increased NMR with RUE. Pt completed shoulder flexion/extension, elbow flexion/extension. Pt benefited from tapping facilitory cues to triceps and firm verbal cueing for increased activation. OT required to support pt's elbow throughout during gravity eliminated position.   Max A transition > EOM for RLE and trunk elevation via HHA. Mod A stand pivot > L > w/c. In room, Mod A stand pivot > BSC and max A lowering of pants. Pt incontinent/continent  of urinary void. Pt remained seated on BSC due to OT time constraint, with NT made aware of position and with all needs in reach at end of session.  Saebo Stim One  Applied for 60 mins (unattended) to the R deltoid for NMR and joint approximation with the following parameters:  330 pulse width 35 Hz pulse rate On 8 sec/ off 8 sec Ramp up/ down 2 sec Symmetrical Biphasic wave form  Max intensity at 500 Ohm load  Removed from pt at end of cycle with no adverse skin reaction or irritation noted.    Therapy Documentation Precautions:  Precautions Precautions: Fall Recall of Precautions/Restrictions: Intact Precaution/Restrictions Comments: R dense hemi Restrictions Weight Bearing Restrictions Per Provider Order: No    Therapy/Group: Individual Therapy  [name], MS, OTR/L  02/20/2024, 12:28 PM

## 2024-02-20 NOTE — Progress Notes (Signed)
 Physical Therapy Session Note  Patient Details  Name: Marisa [name] MRN: [id number] Date of Birth: 14-Feb-1960  Today's Date: 02/20/2024 PT Individual Time: 1300-1340 PT Individual Time Calculation (min): 40 min   Short Term Goals: Week 1:  PT Short Term Goal 1 (Week 1): pt will transfer supine<>sitting EOB with mod A consistantly PT Short Term Goal 1 - Progress (Week 1): Met PT Short Term Goal 2 (Week 1): pt will transfer sit<>stand with LRAD and min A PT Short Term Goal 2 - Progress (Week 1): Met PT Short Term Goal 3 (Week 1): pt will transfer bed<>chair with LRAD and min A PT Short Term Goal 3 - Progress (Week 1): Progressing toward goal Week 2:  PT Short Term Goal 1 (Week 2): Pt will transfer supine<>sitting EOB with min A consistantly PT Short Term Goal 2 (Week 2): pt will transfer bed<>chair with LRAD and min A PT Short Term Goal 3 (Week 2): pt will ambulate 26ft with LRAD and max A of 1  Skilled Therapeutic Interventions/Progress Updates:   Received pt sitting in WC, pt agreeable to PT treatment, and denied any pain during session. Scott took pt's shoe to add toe cap and get fitted for AFO, therefore attempted to don demo AFO with smaller shoes but R foot would not fit into shoe - donned shoes without AFO and pt transported to main therapy gym in [facility] depednently. Ace wrapped RLE into DF and added shoe cover. Pt stood with hemi walker and min A and ambulated 55ft with hemi walker and mod A - assist to advance RLE and block from hyperextension>buckling. Chris from [place] arrived with new R PLS AFO and toe cap for R shoe. Pt ambulated additional 73ft with hemi walker and min A - noted improvements in R foot clearance but [place] plans to cut down back of AFO to fit around calf better. Pt performed WC mobility 142ft using R hemi technique and supervision back to room. Performed stand<>pivot onto commode using grab bar and min A and required max A for clothing management. Pt left seated on  commode left in care of family to assist with toileting (cleared for toilet transfers).  Therapy Documentation Precautions:  Precautions Precautions: Fall Recall of Precautions/Restrictions: Intact Precaution/Restrictions Comments: R dense hemi Restrictions Weight Bearing Restrictions Per Provider Order: No  Therapy/Group: Individual Therapy Therisa HERO Zaunegger Therisa Stains PT, DPT 02/20/2024, 6:52 AM

## 2024-02-20 NOTE — Progress Notes (Signed)
 Occupational Therapy Weekly Progress Note  Patient Details  Name: Marisa [name] MRN: [id number] Date of Birth: [birth year]  Beginning of progress report period: February 13, 2024 End of progress report period: February 20, 2024   Patient has met 2 of 3 short term goals. Pt is making great progress towards LTGs. She is able to bathe with mod A, UB dress with min/mod A, LB dress with mod A and completes toileting with Mod A. Pt continues to demonstrate RUE/RLE hemiplegia (though with increased activation in both in the past week!!!), dynamic standing balance, functional endurance, and mild cog deficits resulting in difficulty completing BADL tasks without increased physical assist. Pt will benefit from continued skilled OT services to focus on mentioned deficits. Family ed not initiated as of date and will be needed in prep for DC.   Patient continues to demonstrate the following deficits: muscle weakness, decreased cardiorespiratoy endurance, decreased coordination and decreased motor planning, decreased problem solving, and decreased standing balance and hemiplegia and therefore will continue to benefit from skilled OT intervention to enhance overall performance with BADL and Reduce care partner burden.  Patient progressing toward long term goals..  Continue plan of care.  OT Short Term Goals Week 1:  OT Short Term Goal 1 (Week 1): Pt will complete toilet transfer with mod A OT Short Term Goal 1 - Progress (Week 1): Met OT Short Term Goal 2 (Week 1): Pt will complete LB dressing with mod A OT Short Term Goal 2 - Progress (Week 1): Met OT Short Term Goal 3 (Week 1): Pt will complete sit > stand with min A using LRAD OT Short Term Goal 3 - Progress (Week 1): Met Week 2:  OT Short Term Goal 1 (Week 2): Pt will keep RUE in safe place 100% of the time with min questioning cues for 2 consecutive sessions OT Short Term Goal 1 - Progress (Week 2): Progressing toward goal OT Short Term Goal 2  (Week 2): Pt will complete UB dressing with min A OT Short Term Goal 2 - Progress (Week 2): Met OT Short Term Goal 3 (Week 2): Pt will complete toileting with mod A using LRAD OT Short Term Goal 3 - Progress (Week 2): Met Week 3:  OT Short Term Goal 1 (Week 3): Pt will keep RUE in safe place 100% of the time with min questioning cues for 2 consecutive sessions OT Short Term Goal 2 (Week 3): Pt will complete functional bed mobility with min A OT Short Term Goal 3 (Week 3): Pt will complete LB dressing with AE PRN with mod A    Jovon Streetman E Dejai Schubach, MS, OTR/L  02/20/2024, 9:46 AM

## 2024-02-20 NOTE — Progress Notes (Signed)
 "                                                        PROGRESS NOTE   Subjective/Complaints: Patient has mild constipation, she does not want to take anything for this during the day, only at night, she is already on 2 tabs of senna at night  ROS: +right sided hemiplegia- continues See HPI,  denies fevers/chills, CP, SOB, abd pain, n/v/d/c, fatigue has decreased, +mild constipation   Objective:   CT HEAD WO CONTRAST ( ) Result Date: 02/18/2024 EXAM: CT HEAD WITHOUT CONTRAST 02/18/2024 10:41:30 AM TECHNIQUE: CT of the head was performed without the administration of intravenous contrast. Automated exposure control, iterative reconstruction, and/or weight based adjustment of the mA/kV was utilized to reduce the radiation dose to as low as reasonably achievable. COMPARISON: Brain MRI 01/29/2024 and CT head 01/28/2024. CLINICAL HISTORY: 64 year old female with new left-sided facial numbness and recent left corona radiata/lentiform infarct. FINDINGS: BRAIN AND VENTRICLES: No acute hemorrhage. No evidence of acute infarct. No hydrocephalus. No extra-axial collection. No mass effect or midline shift. Brain volume is stable and within normal limits for age. Expected evolution of left lentiform and corona radiata infarct with hypodensity, no hemorrhage or mass effect. Small but circumscribed hypodensity along the anterior superior right thalamus on series 2 image 13 is not identified previously. Age indeterminate small vessel disease suspected in the right thalamus. White matter differentiation is stable and within normal limits. No suspicious intracranial vascular hyperdensity. ORBITS: No acute abnormality. SINUSES: No acute abnormality. SOFT TISSUES AND SKULL: No acute soft tissue abnormality. No skull fracture. IMPRESSION: 1. Suspicion of age-indeterminate small vessel disease in the right thalamus. 2. Otherwise expected evolution of recent left lentiform and corona radiata infarct without hemorrhage or  mass effect. Electronically signed by: Helayne Hurst MD 02/18/2024 10:57 AM EST RP Workstation: [id number]    Recent Labs    02/18/24 0506 02/19/24 0545  WBC 7.1 7.4  HGB 11.5* 11.4*  HCT 36.1 35.3*  PLT 276 249    Recent Labs    02/19/24 0545 02/20/24 0646  NA 140 138  K 3.6 3.5  CL 107 106  CO2 23 22  GLUCOSE 88 97  BUN 18 18  CREATININE 0.88 0.73  CALCIUM  9.0 9.0    Intake/Output Summary (Last 24 hours) at 02/20/2024 0953 Last data filed at 02/20/2024 0900 Gross per 24 hour  Intake 712 ml  Output --  Net 712 ml        Physical Exam: Vital Signs Blood pressure 134/61, pulse 65, temperature 98.3 F (36.8 C), temperature source Oral, resp. rate 18, height 5' (1.524 m), weight 105.7 kg, SpO2 92%.  Gen: no distress, sitting in w/c, comfortable HEENT: oral mucosa pink and moist, NCAT Cardio: bradycardic, reg rhythm, no m/r/g appreciated Chest: normal effort, normal rate of breathing, CTAB Abd: soft, non-distended, nontender, +BS throughout Ext: BLE nonpitting edema, improved in RLE Psych: pleasant, normal affect, interactive Skin: intact to exposed surfaces Neuro: Alert and oriented x3, 5/5 strength on left side, 0/5 strength throughout right UE, right lower extremity with 3/5 KE and is otherwise 0/5, sensation is intact, stable 1/23   Assessment/Plan: 1. Functional deficits which require 3+ hours per day of interdisciplinary therapy in a comprehensive inpatient rehab setting. Physiatrist is providing close team supervision and 24  hour management of active medical problems listed below. Physiatrist and rehab team continue to assess barriers to discharge/monitor patient progress toward functional and medical goals  Care Tool:  Bathing    Body parts bathed by patient: Right arm, Chest, Abdomen, Front perineal area, Left upper leg, Face, Right upper leg   Body parts bathed by helper: Left arm, Right lower leg, Right upper leg     Bathing assist Assist Level:  Minimal Assistance - Patient > 75%     Upper Body Dressing/Undressing Upper body dressing   What is the patient wearing?: Pull over shirt    Upper body assist Assist Level: Contact Guard/Touching assist    Lower Body Dressing/Undressing Lower body dressing      What is the patient wearing?: Incontinence brief, Pants     Lower body assist Assist for lower body dressing: Moderate Assistance - Patient 50 - 74%     Toileting Toileting    Toileting assist Assist for toileting: Moderate Assistance - Patient 50 - 74%     Transfers Chair/bed transfer  Transfers assist     Chair/bed transfer assist level: Minimal Assistance - Patient > 75%     Locomotion Ambulation   Ambulation assist      Assist level: 2 helpers Assistive device: Other (comment) (3-muskateers) Max distance: 42'   Walk 10 feet activity   Assist     Assist level: 2 helpers Assistive device: Other (comment) (3-muskateers)   Walk 50 feet activity   Assist Walk 50 feet with 2 turns activity did not occur: Safety/medical concerns (weakness, fatigue)  Assist level: 2 helpers Assistive device: Other (comment) (3-muskateers)    Walk 150 feet activity   Assist Walk 150 feet activity did not occur: Safety/medical concerns         Walk 10 feet on uneven surface  activity   Assist Walk 10 feet on uneven surfaces activity did not occur: Safety/medical concerns         Wheelchair     Assist Is the patient using a wheelchair?: Yes Type of Wheelchair: Manual    Wheelchair assist level: Supervision/Verbal cueing Max wheelchair distance: 138ft    Wheelchair 50 feet with 2 turns activity    Assist        Assist Level: Supervision/Verbal cueing   Wheelchair 150 feet activity     Assist      Assist Level: Dependent - Patient 0%   Blood pressure 134/61, pulse 65, temperature 98.3 F (36.8 C), temperature source Oral, resp. rate 18, height 5' (1.524 m), weight  105.7 kg, SpO2 92%.  Medical Problem List and Plan: 1. 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 Continue Asa and Plavix  with statin. Follow up with Dr. Chalice in 4 weeks.              -patient may  shower -ELOS/Goals: 14-18 days due to R hemiplegia- Min A hopefully, goals, mod I to supervision for SLP/dysarthria Chart and therapy notes reviewed, continue CIR, discussed patient's progress with therapy Grounds pass ordered Vitamin D3/Metanx/Vitamin B/C complex ordered F/u with Dr. JONELLE or Fidela in clinic within 1 month Would benefit from home health aide upon discharge  Continue CIR, d/c goal 1/29   2.  Antithrombotics: continue Lovenox  40mg  daily; antiplatelet ASA and plavix  daily   3. Pain Management: Tylenol  prn    4. Mood/Behavior/Sleep: LCSW to follow for evaluation and support when available.              -  antipsychotic agents: N/A  -trazodone  PRN   5. Neuropsych/cognition: This patient is capable of making decisions on her own behalf.   6. Skin/Wound Care: Routine pressure relief measures.    7. FEN/AKI: encouraged oral hydration, continue vitamins/supplements, continue routine labs.    8. Screening for vitamin D  deficiency: D level 28.1 on 02/06/24, increase D3 to 2,000U daily   9. HTN: BP reviewed and is normal, amlodipine  d/ced due to leg swelling, d/c chorthalidone 25mg  daily, d/c avapro , continue Bystolic  10mg  daily.  Monitor BP with activity. Hydrochlorothiazide  6.25mg  daily added due to leg swelling Vitals:   02/17/24 1500 02/17/24 2056 02/18/24 0407 02/18/24 0800  BP: 127/61 129/63 134/67 (!) 153/85   02/18/24 1011 02/18/24 1108 02/18/24 1248 02/18/24 2020  BP: (!) 150/90 138/71 (!) 154/68 130/65   02/19/24 0552 02/19/24 1552 02/19/24 1924 02/20/24 0537  BP: 131/63 133/71 131/76 134/61    10. DM2: A1c 7.2, Home metformin  held, d/c Farxiga . D/c ISS, CBGs reviewed and remain well controlled, will discontinue  CBG checks and we can monitor fasting glucose with biweekly BMPs  CBG (last 3)  Recent Labs    02/19/24 1634 02/19/24 2018 02/20/24 0537  GLUCAP 129* 113* 94     11. HLD: continue  Lipitor 80 mg daily, discussed that repeat lipid panel shows improved LDL and total cholesterol and worse HDL   12. OSA: continue CPAP nightly    13. Left Thyroid Nodule: 2.2 cm thyroid nodule found on CT.  Follow up outpatient for non emergent thyroid US .    14. Class 4 Obesity:  BMI is 47.85 kg/m . Educate on diet and weight loss to promote overall health and mobility. Continue magnesium  supplement   15. Constipation- will order Miralax  daily that she's taking as well as Dulcolax suppository tonight to help her get back on schedule. - She also wants apple juice. Mag oxide added HS. Continue senokot s 2 tabs nightly. LBM 1/19-messaged nursing to confirm accuracy, d/c prn enema, d/c scheduled miralax   16. Hypokalemia: klor ordered on 1/19, kdur ordered 1/20, 40 klor ordered 1/22, repeat potassium on Monday  17. Right hand swelling: US  reviewed and is negative for clot- discussed with patient  18. RLE swelling: Vas US  ordered- discussed that this was negative for clot, discussed that swelling has improved after discontinuation of amlodipine , BP reviewed and remains stable off amlodipine   LOS: 15 days A FACE TO FACE EVALUATION WAS PERFORMED  Sven SQUIBB Hanae Waiters 02/20/2024, 9:53 AM     "

## 2024-02-20 NOTE — Progress Notes (Signed)
 Occupational Therapy Session Note  Patient Details  Name: Marisa [name] MRN: [id number] Date of Birth: May 09, 1960  Today's Date: 02/20/2024 OT Individual Time: 9179-9063 OT Individual Time Calculation (min): 76 min    Short Term Goals: Week 2:  OT Short Term Goal 1 (Week 2): Pt will keep RUE in safe place 100% of the time with min questioning cues for 2 consecutive sessions OT Short Term Goal 1 - Progress (Week 2): Progressing toward goal OT Short Term Goal 2 (Week 2): Pt will complete UB dressing with min A OT Short Term Goal 2 - Progress (Week 2): Met OT Short Term Goal 3 (Week 2): Pt will complete toileting with mod A using LRAD OT Short Term Goal 3 - Progress (Week 2): Met  Skilled Therapeutic Interventions/Progress Updates:  Pt greeted supine in bed, pt agreeable to OT intervention.      Transfers/bed mobility/functional mobility:  Pt completed supine>sit with MIN A to elevate trunk and manage RLE to EOB. Pt completed sit>stands during dressing with HW and MINA with R knee blocked. Pt completed stand pivot to w/c to L side with no AD and MINA. Pt able to reach to w/c on L side and pivot with R hemibody supported, pt able to advance RLE during transfer.     ADLs:  Grooming: pt completed seated oral care at sink with set- up assist, educated pt on compensatory strategies such as hemi technique for donning paste and automatic dispensing containers for toothpaste.   UB dressing: pt able to don OH shirt with MIN A, assist needed to thread RUE first but then pt completed remainder of task.   LB dressing: pt donned pants from EOB with MAX A, assist needed to thread BLEs from EOB but pt was able to assist with pulling pants to waist line in standing. Pt able to stand with HW on L side to complete LB dressing.   Footwear: donned socks and teds from bed level with total A  Bathing: pt cleansed periarea from bed level with set- up assist d/t wet brief.   Transfers: pt completed  stand pivot transfer from w/c> toilet to L side with MIN A, Min cues needed for hand placement and technique, MIN A needed overall to provided steadying assist to R hemibody.   Toileting: pt able to cleanse anterior periarea in standing with MIN A for standing balance.     NMR: pt completed various seated NMRE exercises to facilitate improved ROM and coordination in RUE: -placed RUE on weighted ball with pt completing wrist Supination/pronation with pt needing MIN A to stabilize R elbow and to keep R hand on ball.  -placed RUE on weighted ball with pt completing scapular protraction/retraction on Ball with a focus on wrist flexion/extension and maintaining grasp on ball  -donned RUE on hand skate with pt instructed to reach towards target with R elbow supported to facilitate improved active assist ROM, pt completed task with 2-/5  -with arm skate on R hand and wrist propped into extension pt able to flex wrist down with force of gravity assisting pt with active assist ROM. Provided tapping to dorsal aspect of wrist with pt then able to achieve 2-/5 wrist extension, pt very proud of this achievement.                 Ended session with pt seated in w/c with all needs within reach and NT present.   Therapy Documentation Precautions:  Precautions Precautions: Fall Recall of Precautions/Restrictions: Intact  Precaution/Restrictions Comments: R dense hemi Restrictions Weight Bearing Restrictions Per Provider Order: No  Pain: No pain reported during session    Therapy/Group: Individual Therapy  Ronal [facility] 02/20/2024, 12:03 PM

## 2024-02-21 ENCOUNTER — Inpatient Hospital Stay (HOSPITAL_COMMUNITY)

## 2024-02-21 DIAGNOSIS — W19XXXA Unspecified fall, initial encounter: Secondary | ICD-10-CM

## 2024-02-21 NOTE — Plan of Care (Signed)
" °  Problem: Consults Goal: RH STROKE PATIENT EDUCATION Description: See Patient Education module for education specifics  Outcome: Progressing Goal: Nutrition Consult-if indicated Outcome: Progressing Goal: Diabetes Guidelines if Diabetic/Glucose > 140 Description: If diabetic or lab glucose is > 140 mg/dl - Initiate Diabetes/Hyperglycemia Guidelines & Document Interventions  Outcome: Progressing   Problem: RH BOWEL ELIMINATION Goal: RH STG MANAGE BOWEL WITH ASSISTANCE Description: STG Manage Bowel with mod I Assistance. Outcome: Progressing Goal: RH STG MANAGE BOWEL W/MEDICATION W/ASSISTANCE Description: STG Manage Bowel with Medication with mod I Assistance. Outcome: Progressing   Problem: RH BLADDER ELIMINATION Goal: RH STG MANAGE BLADDER WITH ASSISTANCE Description: STG Manage Bladder With toileting Assistance Outcome: Progressing   Problem: RH SAFETY Goal: RH STG ADHERE TO SAFETY PRECAUTIONS W/ASSISTANCE/DEVICE Description: STG Adhere to Safety Precautions With cues Assistance/Device. Outcome: Progressing   Problem: RH KNOWLEDGE DEFICIT Goal: RH STG INCREASE KNOWLEDGE OF DIABETES Description: Patient and spouse will be able to manage DM using educational resources for medications and dietary modification independently Outcome: Progressing Goal: RH STG INCREASE KNOWLEDGE OF HYPERTENSION Description: Patient and spouse will be able to manage HTN using educational resources for medications and dietary modification independently Outcome: Progressing Goal: RH STG INCREASE KNOWLEGDE OF HYPERLIPIDEMIA Description: Patient and spouse will be able to manage HLD using educational resources for medications and dietary modification independently Outcome: Progressing Goal: RH STG INCREASE KNOWLEDGE OF STROKE PROPHYLAXIS Description: Patient and spouse will be able to manage secondary risks using educational resources for medications and dietary modification independently Outcome:  Progressing   "

## 2024-02-21 NOTE — Progress Notes (Signed)
" °   02/21/24 1336  What Happened  Was fall witnessed? Yes  Who witnessed fall? Therisa, PT  Patients activity before fall ambulating-assisted  Point of contact buttocks (hit head against the wall)  Was patient injured? Yes  Provider Notification  Provider Name/Title [place], [place]  Date Provider Notified 02/21/24  Time Provider Notified 1336  Method of Notification Face-to-face  Notification Reason Fall  Provider response See new orders  Date of Provider Response 02/21/24  Time of Provider Response 1336  Follow Up  Family notified  (Attempted to call, no reponse)  Time family notified 1340  Additional tests No  Simple treatment Ice  Progress note created (see row info) Yes  Adult Fall Risk Assessment  Risk Factor Category (scoring not indicated) Fall has occurred during this admission (document High fall risk)  Patient Fall Risk Level High fall risk  Adult Fall Risk Interventions  Required Bundle Interventions *See Row Information* High fall risk  Additional Interventions Use of appropriate toileting equipment (bedpan, BSC, etc.)  Screening for Fall Injury Risk (To be completed on HIGH fall risk patients) - Assessing Need for Floor Mats  Risk For Fall Injury- Criteria for Floor Mats Previous fall this admission  Will Implement Floor Mats Yes  Vitals  Temp 98.5 F (36.9 C)  Temp Source Oral  BP (!) 120/97  MAP (mmHg) 104  BP Location Left Arm  BP Method Automatic  Patient Position (if appropriate) Sitting  Pulse Rate 64  Pulse Rate Source Monitor  Resp 18  Oxygen Therapy  SpO2 99 %  O2 Device Room Air  Neurological  Neuro (WDL) X  Level of Consciousness Alert  Orientation Level Oriented X4  Cognition Appropriate at baseline  Speech Clear  Motor Function/Sensation Assessment Grip;Motor response  R Hand Grip Absent  L Hand Grip Moderate  R Foot Dorsiflexion Absent  L Foot Dorsiflexion Moderate  R Foot Plantar Flexion Absent  L Foot Plantar Flexion Moderate  RUE  Motor Response No movement to painful stimulus  Neuro Symptoms None  Musculoskeletal  Musculoskeletal (WDL) X  Assistive Device Wheelchair  Generalized Weakness Yes  Weight Bearing Restrictions Per Provider Order No  Musculoskeletal Details  RUE Paralysis  LUE Full movement  RLE Weakness  LLE Full movement  Integumentary  Integumentary (WDL) WDL  Skin Color Appropriate for ethnicity  Skin Turgor Non-tenting   "

## 2024-02-21 NOTE — Progress Notes (Signed)
 Occupational Therapy Session Note  Patient Details  Name: Marisa [name] MRN: [id number] Date of Birth: [birth year]  Today's Date: 02/21/2024 OT Individual Time: 1000-1100 OT Individual Time Calculation (min): 60 min    Short Term Goals: Week 1:  OT Short Term Goal 1 (Week 1): Pt will complete toilet transfer with mod A OT Short Term Goal 1 - Progress (Week 1): Met OT Short Term Goal 2 (Week 1): Pt will complete LB dressing with mod A OT Short Term Goal 2 - Progress (Week 1): Met OT Short Term Goal 3 (Week 1): Pt will complete sit > stand with min A using LRAD OT Short Term Goal 3 - Progress (Week 1): Met  Skilled Therapeutic Interventions/Progress Updates:     Initial Encounter:  The pt was supine in bed at the time of arrival. The pt had no pain to report,.The patient went on to say that she rested well during the night. The pt was in ageement with completing BADL in showering in preparation for her day.  BADL Related Task: The pt was able to come from supine to EOB with MinA to CGA using the grab bar.The pt was able to come from sit to stand using the stedy with Mod to MinA for coming into stand using the bar of the stedy. The patient was transported to the shower and was able to transfer to the shower seat with MinA for placement using the bar of the stedy. The pt was able to doff her hospital gown with MinA for ties. The pt was able to bathe her UB inclusive of her face, chest, midriff, and the out arm for the RUE with MinA for her RUE,. The pt was able to come into stand using the bar of the stedy with ModA for peri care front and for cleaning her bottom. The pt was able to bathe the upper portion of BLE. With s/uA, she was MinA for bilateral distal LE using the long handle sponge. The pt was able to dry off and apply deo with ModA. The pt was able to donn her over head shirt with ModA, she was MaxA for threading her feet into the opeining of the pants and bring them onto her hips  and bottom. The pt was transported to the sink and was able to brush her teeth with s?uA. The pt remained at w/c LOF with the call light and bedside table within reach. All additional needs were addressed prior to me exiting the room.  Therapy Documentation Precautions:  Precautions Precautions: Fall Recall of Precautions/Restrictions: Intact Precaution/Restrictions Comments: R dense hemi Restrictions Weight Bearing Restrictions Per Provider Order: No  Therapy/Group: Individual Therapy  [name] 02/21/2024, 4:45 PM

## 2024-02-21 NOTE — Progress Notes (Signed)
 Physical Therapy Weekly Progress Note  Patient Details  Name: Marisa [name] MRN: [id number] Date of Birth: February 24, 1960  Beginning of progress report period: February 06, 2024 End of progress report period: February 21, 2024  Patient has met 3 of 3 short term goals. Pt demonstrates steady progress towards long term goals. Pt is currently able to perform bed mobility with min A using hospital bed features, sit<>stands and squat<>pivot transfers with min A, ambulate 37ft with hemi walker and R PLS AFO with mod A, and navigate 4 6in steps with L handrail and +2 assist. Pt demonstrates improvements in R DF, quad, and glute activation but remains limited by hamstring weakness and continues to fluctuate between buckling/hyperextending R knee with fatigue. Pt's family has been cleared to assist with Stedy transfers but will need continued hands on family education prior to D/C. Pt has received custom AFO from [facility] and custom WC evaluation scheduled for 1/27 at 11am.   Patient continues to demonstrate the following deficits muscle weakness, decreased cardiorespiratoy endurance, impaired timing and sequencing, abnormal tone, unbalanced muscle activation, decreased coordination, and decreased motor planning, and decreased standing balance, decreased postural control, hemiplegia, and decreased balance strategies and therefore will continue to benefit from skilled PT intervention to increase functional independence with mobility.  Patient progressing toward long term goals..  Continue plan of care.  PT Short Term Goals Week 2:  PT Short Term Goal 1 (Week 2): Pt will transfer supine<>sitting EOB with min A consistantly PT Short Term Goal 1 - Progress (Week 2): Met PT Short Term Goal 2 (Week 2): pt will transfer bed<>chair with LRAD and min A PT Short Term Goal 2 - Progress (Week 2): Met PT Short Term Goal 3 (Week 2): pt will ambulate 19ft with LRAD and max A of 1 PT Short Term Goal 3 - Progress (Week 2):  Met Week 3:     Skilled Therapeutic Interventions/Progress Updates:  Ambulation/gait training;Discharge planning;Functional mobility training;Psychosocial support;Therapeutic Activities;Balance/vestibular training;Disease management/prevention;Neuromuscular re-education;Skin care/wound management;Therapeutic Exercise;Wheelchair propulsion/positioning;Cognitive remediation/compensation;DME/adaptive equipment instruction;Pain management;Splinting/orthotics;UE/LE Strength taining/ROM;Community reintegration;Functional electrical stimulation;Patient/family education;Stair training;UE/LE Coordination activities   Therapy Documentation Precautions:  Precautions Precautions: Fall Recall of Precautions/Restrictions: Intact Precaution/Restrictions Comments: R dense hemi Restrictions Weight Bearing Restrictions Per Provider Order: No  Therapy/Group: Individual Therapy Therisa HERO Zaunegger Therisa Stains PT, DPT 02/21/2024, 7:02 AM

## 2024-02-21 NOTE — Progress Notes (Signed)
 Physical Therapy Session Note  Patient Details  Name: Marisa [name] MRN: [id number] Date of Birth: 03/12/1960  Today's Date: 02/21/2024 PT Individual Time: 1300-1426 PT Individual Time Calculation (min): 86 min   Short Term Goals: Week 1:  PT Short Term Goal 1 (Week 1): pt will transfer supine<>sitting EOB with mod A consistantly PT Short Term Goal 1 - Progress (Week 1): Met PT Short Term Goal 2 (Week 1): pt will transfer sit<>stand with LRAD and min A PT Short Term Goal 2 - Progress (Week 1): Met PT Short Term Goal 3 (Week 1): pt will transfer bed<>chair with LRAD and min A PT Short Term Goal 3 - Progress (Week 1): Progressing toward goal Week 2:  PT Short Term Goal 1 (Week 2): Pt will transfer supine<>sitting EOB with min A consistantly PT Short Term Goal 2 (Week 2): pt will transfer bed<>chair with LRAD and min A PT Short Term Goal 3 (Week 2): pt will ambulate 67ft with LRAD and max A of 1  Skilled Therapeutic Interventions/Progress Updates:   Received pt sitting in WC, pt agreeable to PT treatment and denied any pain during session. Session with emphasis on functional mobility/transfers, generalized strengthening and endurance, dynamic standing balance/coordination, NMR, and gait training. Donned ted hose for edema management with total A and shoes with R AFO and max A. Provided pt with printout of temporary ramp from [facility], then pt transported to main therapy gym in [facility] dependently for time management purposes. Stood with hemi walker and min A and ambulated 70ft with hemi walker and min A with +2 for close WC follow. Pt able to advance RLE without assist today! But required min guarding at R knee due to occasional buckling/hyperextension. Provided verbal cues to widen BOS and to stand tall on RLE. Progressed to working on turns - pt performed 2 turns and on 2nd turn to L, R foot slipped out of AFO causing pt to misstep and pt lost her balance. Assisted pt with descent to floor. Pt  initially landed on L knee, however pt did hit L side of head on wall. Pt verbalized feeling okay overall and able to get into long sitting with min A. Placed Maximove sling with +2 assist and transferred back into WC dependently using Maximove. PA and multiple RNs present to perform assessment and check vitals - all WNL, but pt with knot on L forehead and soreness in L knee (ice provided). PA cleared pt to continue with therapy as pt requesting to keep working.   Stood again with hemi walker and min A and ambulated 59ft with hemi walker and min A+2. Pt suddenly began sinking down and unable to regain posture and ultimately sat down on therapist's knee, then required +2 assist to safely pivot hips into WC - deferred any further ambulation during session. Performed stand<>pivot on/off Nustep with min A and used RUE and RLE attachment for support and to prevent excessive R hip abduction. Pt performed seated BUE/BLE strengthening on Nustep at workload 5 for 15 minutes with emphasis on cardiovascular endurance and reciprocal movement training. Total of 718 steps, 0.4 miles, 37 SPM, and 1.7 METs. Returned to room and concluded session with pt sitting in [facility] with all needs within reach and RN attending to care.   Therapy Documentation Precautions:  Precautions Precautions: Fall Recall of Precautions/Restrictions: Intact Precaution/Restrictions Comments: R dense hemi Restrictions Weight Bearing Restrictions Per Provider Order: No  Therapy/Group: Individual Therapy Therisa HERO Zaunegger Therisa Stains PT, DPT 02/21/2024, 6:40 AM

## 2024-02-21 NOTE — Progress Notes (Signed)
 "                                                        PROGRESS NOTE   Subjective/Complaints:  Pt doing well, slept well, denies pain, LBM yesterday x2 (large per pt), urinating ok but still with occasional incontinence. No other complaints or concerns.   Later heard in the hallway falling while in PT-- per therapy, witnessed, was pivoting and leg came out of brace so she stumbled down and hit her forehead on the ground, no other injuries reported by pt or PT.    ROS: +right sided hemiplegia- continues  See HPI,  denies fevers/chills, CP, SOB, abd pain, n/v/d/c fatigue has decreased, +mild constipation   Objective:   No results found.   Recent Labs    02/19/24 0545  WBC 7.4  HGB 11.4*  HCT 35.3*  PLT 249    Recent Labs    02/19/24 0545 02/20/24 0646  NA 140 138  K 3.6 3.5  CL 107 106  CO2 23 22  GLUCOSE 88 97  BUN 18 18  CREATININE 0.88 0.73  CALCIUM  9.0 9.0    Intake/Output Summary (Last 24 hours) at 02/21/2024 1328 Last data filed at 02/21/2024 0745 Gross per 24 hour  Intake 358 ml  Output 600 ml  Net -242 ml        Physical Exam: Vital Signs Blood pressure (!) 113/57, pulse (!) 58, temperature 97.8 F (36.6 C), temperature source Oral, resp. rate 18, height 5' (1.524 m), weight 105.7 kg, SpO2 98%.  Gen: no distress, sitting upright in bed, comfortable HEENT: oral mucosa pink and moist, NCAT Cardio: bradycardic, reg rhythm, no m/r/g appreciated Chest: normal effort, normal rate of breathing, CTAB Abd: soft, non-distended, nontender, +BS throughout Ext: BLE nonpitting edema, improved in RLE Psych: pleasant, normal affect, interactive Skin: intact to exposed surfaces  PRIOR EXAMS: Neuro: Alert and oriented x3, 5/5 strength on left side, 0/5 strength throughout right UE, right lower extremity with 3/5 KE and is otherwise 0/5, sensation is intact, stable 1/23   Assessment/Plan: 1. Functional deficits which require 3+ hours per day of  interdisciplinary therapy in a comprehensive inpatient rehab setting. Physiatrist is providing close team supervision and 24 hour management of active medical problems listed below. Physiatrist and rehab team continue to assess barriers to discharge/monitor patient progress toward functional and medical goals  Care Tool:  Bathing    Body parts bathed by patient: Right arm, Chest, Abdomen, Front perineal area, Left upper leg, Face, Right upper leg   Body parts bathed by helper: Left arm, Right lower leg, Right upper leg     Bathing assist Assist Level: Minimal Assistance - Patient > 75%     Upper Body Dressing/Undressing Upper body dressing   What is the patient wearing?: Pull over shirt    Upper body assist Assist Level: Minimal Assistance - Patient > 75%    Lower Body Dressing/Undressing Lower body dressing      What is the patient wearing?: Pants, Underwear/pull up     Lower body assist Assist for lower body dressing: Maximal Assistance - Patient 25 - 49%     Toileting Toileting    Toileting assist Assist for toileting: Moderate Assistance - Patient 50 - 74%     Transfers Chair/bed transfer  Transfers assist  Chair/bed transfer assist level: Minimal Assistance - Patient > 75%     Locomotion Ambulation   Ambulation assist      Assist level: 2 helpers Assistive device: Other (comment) (3-muskateers) Max distance: 61'   Walk 10 feet activity   Assist     Assist level: 2 helpers Assistive device: Other (comment) (3-muskateers)   Walk 50 feet activity   Assist Walk 50 feet with 2 turns activity did not occur: Safety/medical concerns (weakness, fatigue)  Assist level: 2 helpers Assistive device: Other (comment) (3-muskateers)    Walk 150 feet activity   Assist Walk 150 feet activity did not occur: Safety/medical concerns         Walk 10 feet on uneven surface  activity   Assist Walk 10 feet on uneven surfaces activity did not  occur: Safety/medical concerns         Wheelchair     Assist Is the patient using a wheelchair?: Yes Type of Wheelchair: Manual    Wheelchair assist level: Supervision/Verbal cueing Max wheelchair distance: 114ft    Wheelchair 50 feet with 2 turns activity    Assist        Assist Level: Supervision/Verbal cueing   Wheelchair 150 feet activity     Assist      Assist Level: Dependent - Patient 0%   Blood pressure (!) 113/57, pulse (!) 58, temperature 97.8 F (36.6 C), temperature source Oral, resp. rate 18, height 5' (1.524 m), weight 105.7 kg, SpO2 98%.  Medical Problem List and Plan: 1. 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 Continue Asa and Plavix  with statin. Follow up with Dr. Chalice in 4 weeks.              -patient may  shower -ELOS/Goals: 14-18 days due to R hemiplegia- Min A hopefully, goals, mod I to supervision for SLP/dysarthria Chart and therapy notes reviewed, continue CIR, discussed patient's progress with therapy Grounds pass ordered Vitamin D3/Metanx/Vitamin B/C complex ordered F/u with Dr. JONELLE or Fidela in clinic within 1 month Would benefit from home health aide upon discharge  Continue CIR, d/c goal 1/29 -02/21/24 fall in hallway during PT, witnessed, hit her head, will order head CT. No other injuries. Nursing to call if other aches/pains arise   2.  Antithrombotics: continue Lovenox  40mg  daily; antiplatelet ASA and plavix  daily   3. Pain Management: Tylenol  prn    4. Mood/Behavior/Sleep: LCSW to follow for evaluation and support when available.              -antipsychotic agents: N/A  -trazodone  PRN   5. Neuropsych/cognition: This patient is capable of making decisions on her own behalf.   6. Skin/Wound Care: Routine pressure relief measures.    7. FEN/AKI: encouraged oral hydration, continue vitamins/supplements, continue routine labs.    8. Screening for vitamin D   deficiency: D level 28.1 on 02/06/24, increase D3 to 2,000U daily   9. HTN: BP reviewed and is normal, amlodipine  d/ced due to leg swelling, d/c chorthalidone 25mg  daily, d/c avapro , continue Bystolic  10mg  daily.  Monitor BP with activity. Hydrochlorothiazide  6.25mg  daily added due to leg swelling  -02/21/24 BP stable, monitor Vitals:   02/18/24 0800 02/18/24 1011 02/18/24 1108 02/18/24 1248  BP: (!) 153/85 (!) 150/90 138/71 (!) 154/68   02/18/24 2020 02/19/24 0552 02/19/24 1552 02/19/24 1924  BP: 130/65 131/63 133/71 131/76   02/20/24 0537 02/20/24 1313 02/20/24 1954 02/21/24 0401  BP: 134/61 (!) 140/77 132/69 ROLLEN)  113/57    10. DM2: A1c 7.2, Home metformin  held, d/c Farxiga . D/c ISS, CBGs reviewed and remain well controlled, will discontinue CBG checks and we can monitor fasting glucose with biweekly BMPs  CBG (last 3)  Recent Labs    02/19/24 1634 02/19/24 2018 02/20/24 0537  GLUCAP 129* 113* 94     11. HLD: continue  Lipitor 80 mg daily, discussed that repeat lipid panel shows improved LDL and total cholesterol and worse HDL   12. OSA: continue CPAP nightly    13. Left Thyroid Nodule: 2.2 cm thyroid nodule found on CT.  Follow up outpatient for non emergent thyroid US .    14. Class 4 Obesity:  BMI is 47.85 kg/m . Educate on diet and weight loss to promote overall health and mobility. Continue magnesium  supplement   15. Constipation- will order Miralax  daily that she's taking as well as Dulcolax suppository tonight to help her get back on schedule. - She also wants apple juice. Mag oxide added HS. Continue senokot s 2 tabs nightly. d/c prn enema, d/c scheduled miralax   -02/21/24 LBM last night, monitor on current regimen  16. Hypokalemia: klor ordered on 1/19, kdur ordered 1/20, 40 klor ordered 1/22, repeat potassium on Monday  17. Right hand swelling: US  reviewed and is negative for clot- discussed with patient  18. RLE swelling: Vas US  ordered- discussed that this  was negative for clot, discussed that swelling has improved after discontinuation of amlodipine , BP reviewed and remains stable off amlodipine    LOS: 16 days A FACE TO FACE EVALUATION WAS PERFORMED  [address] 02/21/2024, 1:28 PM     "

## 2024-02-22 DIAGNOSIS — W19XXXD Unspecified fall, subsequent encounter: Secondary | ICD-10-CM

## 2024-02-22 DIAGNOSIS — M25532 Pain in left wrist: Secondary | ICD-10-CM

## 2024-02-22 MED ORDER — DICLOFENAC SODIUM 1 % EX GEL
2.0000 g | Freq: Four times a day (QID) | CUTANEOUS | Status: DC
  Administered 2024-02-22 – 2024-03-02 (×29): 2 g via TOPICAL
  Filled 2024-02-22: qty 100

## 2024-02-22 NOTE — Plan of Care (Signed)
" °  Problem: Consults Goal: RH STROKE PATIENT EDUCATION Description: See Patient Education module for education specifics  Outcome: Progressing Goal: Nutrition Consult-if indicated Outcome: Progressing Goal: Diabetes Guidelines if Diabetic/Glucose > 140 Description: If diabetic or lab glucose is > 140 mg/dl - Initiate Diabetes/Hyperglycemia Guidelines & Document Interventions  Outcome: Progressing   Problem: RH BOWEL ELIMINATION Goal: RH STG MANAGE BOWEL WITH ASSISTANCE Description: STG Manage Bowel with mod I Assistance. Outcome: Progressing Goal: RH STG MANAGE BOWEL W/MEDICATION W/ASSISTANCE Description: STG Manage Bowel with Medication with mod I Assistance. Outcome: Progressing   Problem: RH BLADDER ELIMINATION Goal: RH STG MANAGE BLADDER WITH ASSISTANCE Description: STG Manage Bladder With toileting Assistance Outcome: Progressing   Problem: RH SAFETY Goal: RH STG ADHERE TO SAFETY PRECAUTIONS W/ASSISTANCE/DEVICE Description: STG Adhere to Safety Precautions With cues Assistance/Device. Outcome: Progressing   Problem: RH KNOWLEDGE DEFICIT Goal: RH STG INCREASE KNOWLEDGE OF DIABETES Description: Patient and spouse will be able to manage DM using educational resources for medications and dietary modification independently Outcome: Progressing Goal: RH STG INCREASE KNOWLEDGE OF HYPERTENSION Description: Patient and spouse will be able to manage HTN using educational resources for medications and dietary modification independently Outcome: Progressing Goal: RH STG INCREASE KNOWLEGDE OF HYPERLIPIDEMIA Description: Patient and spouse will be able to manage HLD using educational resources for medications and dietary modification independently Outcome: Progressing Goal: RH STG INCREASE KNOWLEDGE OF STROKE PROPHYLAXIS Description: Patient and spouse will be able to manage secondary risks using educational resources for medications and dietary modification independently Outcome:  Progressing   "

## 2024-02-22 NOTE — Plan of Care (Signed)
" °  Problem: Consults Goal: RH STROKE PATIENT EDUCATION Description: See Patient Education module for education specifics  Outcome: Progressing   Problem: RH BOWEL ELIMINATION Goal: RH STG MANAGE BOWEL WITH ASSISTANCE Description: STG Manage Bowel with mod I Assistance. Outcome: Progressing   Problem: RH BLADDER ELIMINATION Goal: RH STG MANAGE BLADDER WITH ASSISTANCE Description: STG Manage Bladder With toileting Assistance Outcome: Progressing   Problem: RH SAFETY Goal: RH STG ADHERE TO SAFETY PRECAUTIONS W/ASSISTANCE/DEVICE Description: STG Adhere to Safety Precautions With cues Assistance/Device. Outcome: Progressing   "

## 2024-02-22 NOTE — Progress Notes (Addendum)
 "                                                        PROGRESS NOTE   Subjective/Complaints:  Patient reports feeling well overall this morning, she does have some mild left wrist soreness.  Head CT without acute abnormality.  ROS: +right sided hemiplegia- continues  See HPI,  denies fevers/chills, CP, SOB, abd pain, n/v/d/c fatigue has decreased, +mild constipation +L wrist soreness   Objective:   CT HEAD WO CONTRAST ( ) Result Date: 02/21/2024 EXAM: CT HEAD WITHOUT CONTRAST 02/21/2024 05:33:05 PM TECHNIQUE: CT of the head was performed without the administration of intravenous contrast. Automated exposure control, iterative reconstruction, and/or weight based adjustment of the mA/kV was utilized to reduce the radiation dose to as low as reasonably achievable. COMPARISON: 12/28/2024 CLINICAL HISTORY: Head trauma, minor, normal mental status (Age 33-64y); fall, eval for trauma/injury. Head trauma, minor, normal mental status (Age 33-64 years); fall, evaluation for trauma or injury. FINDINGS: BRAIN AND VENTRICLES: No acute hemorrhage. Expected continued evolution of infarct involving the Left Corona Radiata and Left Lentiform Nucleus. No hydrocephalus. No extra-axial collection. No mass effect or midline shift. Mild atherosclerosis in the Carotid Siphons. ORBITS: No acute abnormality. SINUSES: No acute abnormality. SOFT TISSUES AND SKULL: No acute soft tissue abnormality. No skull fracture. IMPRESSION: 1. No acute intracranial abnormality related to the reported head trauma. 2. Expected continued evolution of infarct involving the left corona radiata and left lentiform nucleus. Electronically signed by: Donnice Mania MD 02/21/2024 05:44 PM EST RP Workstation: [id number]   DG Wrist Complete Left Result Date: 02/21/2024 EXAM: 2 VIEW(S) XRAY OF THE LEFT WRIST 02/21/2024 05:38:00 PM COMPARISON: None available. CLINICAL HISTORY: Fall, left wrist pain. FINDINGS: BONES AND JOINTS: No acute fracture.  No malalignment. SOFT TISSUES: Unremarkable. IMPRESSION: 1. No acute fracture or dislocation. Electronically signed by: Oneil Devonshire MD 02/21/2024 05:40 PM EST RP Workstation: [id number]     No results for input(s): WBC, HGB, HCT, PLT in the last 72 hours.   Recent Labs    02/20/24 0646  NA 138  K 3.5  CL 106  CO2 22  GLUCOSE 97  BUN 18  CREATININE 0.73  CALCIUM  9.0    Intake/Output Summary (Last 24 hours) at 02/22/2024 1501 Last data filed at 02/22/2024 1300 Gross per 24 hour  Intake 578 ml  Output --  Net 578 ml        Physical Exam: Vital Signs Blood pressure 126/69, pulse 62, temperature 98.3 F (36.8 C), resp. rate 18, height 5' (1.524 m), weight 105.7 kg, SpO2 98%.  Gen: no distress, sitting upright in bed, comfortable HEENT: oral mucosa pink and moist, NCAT Cardio: bradycardic, reg rhythm, no m/r/g appreciated Chest: normal effort, normal rate of breathing, CTAB Abd: soft, non-distended, nontender, +BS throughout Ext: BLE nonpitting edema, improved in RLE Psych: pleasant, normal affect, interactive Skin: intact to exposed surfaces  Neuro: Alert and oriented x3, 5/5 strength on left side, 0/5 strength throughout right UE, right lower extremity with 3/5 KE and is otherwise 0/5, sensation is intact  MSK: No significant wrist tenderness, minimal pain with wrist ROM.  She points to mild area of soreness left lateral wrist.   Assessment/Plan: 1. Functional deficits which require 3+ hours per day of interdisciplinary therapy in a comprehensive inpatient rehab setting. Physiatrist  is providing close team supervision and 24 hour management of active medical problems listed below. Physiatrist and rehab team continue to assess barriers to discharge/monitor patient progress toward functional and medical goals  Care Tool:  Bathing    Body parts bathed by patient: Right arm, Chest, Abdomen, Front perineal area, Left upper leg, Face, Right upper leg   Body  parts bathed by helper: Left arm, Right lower leg, Right upper leg     Bathing assist Assist Level: Minimal Assistance - Patient > 75%     Upper Body Dressing/Undressing Upper body dressing   What is the patient wearing?: Pull over shirt    Upper body assist Assist Level: Minimal Assistance - Patient > 75%    Lower Body Dressing/Undressing Lower body dressing      What is the patient wearing?: Pants, Underwear/pull up     Lower body assist Assist for lower body dressing: Maximal Assistance - Patient 25 - 49%     Toileting Toileting    Toileting assist Assist for toileting: Moderate Assistance - Patient 50 - 74%     Transfers Chair/bed transfer  Transfers assist     Chair/bed transfer assist level: Minimal Assistance - Patient > 75%     Locomotion Ambulation   Ambulation assist      Assist level: 2 helpers Assistive device: Other (comment) (3-muskateers) Max distance: 32'   Walk 10 feet activity   Assist     Assist level: 2 helpers Assistive device: Other (comment) (3-muskateers)   Walk 50 feet activity   Assist Walk 50 feet with 2 turns activity did not occur: Safety/medical concerns (weakness, fatigue)  Assist level: 2 helpers Assistive device: Other (comment) (3-muskateers)    Walk 150 feet activity   Assist Walk 150 feet activity did not occur: Safety/medical concerns         Walk 10 feet on uneven surface  activity   Assist Walk 10 feet on uneven surfaces activity did not occur: Safety/medical concerns         Wheelchair     Assist Is the patient using a wheelchair?: Yes Type of Wheelchair: Manual    Wheelchair assist level: Supervision/Verbal cueing Max wheelchair distance: 125ft    Wheelchair 50 feet with 2 turns activity    Assist        Assist Level: Supervision/Verbal cueing   Wheelchair 150 feet activity     Assist      Assist Level: Dependent - Patient 0%   Blood pressure 126/69, pulse  62, temperature 98.3 F (36.8 C), resp. rate 18, height 5' (1.524 m), weight 105.7 kg, SpO2 98%.  Medical Problem List and Plan: 1. 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 Continue Asa and Plavix  with statin. Follow up with Dr. Chalice in 4 weeks.              -patient may  shower -ELOS/Goals: 14-18 days due to R hemiplegia- Min A hopefully, goals, mod I to supervision for SLP/dysarthria Chart and therapy notes reviewed, continue CIR, discussed patient's progress with therapy Grounds pass ordered Vitamin D3/Metanx/Vitamin B/C complex ordered F/u with Dr. JONELLE or Fidela in clinic within 1 month Would benefit from home health aide upon discharge  Continue CIR, d/c goal 1/29 -02/21/24 fall in hallway during PT, witnessed, hit her head, will order head CT. No other injuries. Nursing to call if other aches/pains arise-CT head stable.  Left wrist x-ray without acute fracture.   2.  Antithrombotics: continue Lovenox  40mg  daily; antiplatelet ASA and plavix  daily   3. Pain Management: Tylenol  prn    4. Mood/Behavior/Sleep: LCSW to follow for evaluation and support when available.              -antipsychotic agents: N/A  -trazodone  PRN   5. Neuropsych/cognition: This patient is capable of making decisions on her own behalf.   6. Skin/Wound Care: Routine pressure relief measures.    7. FEN/AKI: encouraged oral hydration, continue vitamins/supplements, continue routine labs.    8. Screening for vitamin D  deficiency: D level 28.1 on 02/06/24, increase D3 to 2,000U daily   9. HTN: BP reviewed and is normal, amlodipine  d/ced due to leg swelling, d/c chorthalidone 25mg  daily, d/c avapro , continue Bystolic  10mg  daily.  Monitor BP with activity. Hydrochlorothiazide  6.25mg  daily added due to leg swelling  -1/24-25  BP stable, monitor Vitals:   02/21/24 1501 02/21/24 1533 02/21/24 1603 02/21/24 1834  BP: (!) 142/79 (!) 154/72 127/79 132/67    02/21/24 1928 02/21/24 2129 02/21/24 2331 02/22/24 0134  BP: 135/68 131/60 (!) 125/57 (!) 132/57   02/22/24 0331 02/22/24 0526 02/22/24 0855 02/22/24 1106  BP: (!) 123/59 129/76 131/70 126/69    10. DM2: A1c 7.2, Home metformin  held, d/c Farxiga . D/c ISS, CBGs reviewed and remain well controlled, will discontinue CBG checks and we can monitor fasting glucose with biweekly BMPs  CBG (last 3)  Recent Labs    02/19/24 1634 02/19/24 2018 02/20/24 0537  GLUCAP 129* 113* 94     11. HLD: continue  Lipitor 80 mg daily, discussed that repeat lipid panel shows improved LDL and total cholesterol and worse HDL   12. OSA: continue CPAP nightly    13. Left Thyroid Nodule: 2.2 cm thyroid nodule found on CT.  Follow up outpatient for non emergent thyroid US .    14. Class 4 Obesity:  BMI is 47.85 kg/m . Educate on diet and weight loss to promote overall health and mobility. Continue magnesium  supplement   15. Constipation- will order Miralax  daily that she's taking as well as Dulcolax suppository tonight to help her get back on schedule. - She also wants apple juice. Mag oxide added HS. Continue senokot s 2 tabs nightly. d/c prn enema, d/c scheduled miralax   -02/21/24 LBM last night, monitor on current regimen  -1/25 LBM 1/23 continue to monitor for now  16. Hypokalemia: klor ordered on 1/19, kdur ordered 1/20, 40 klor ordered 1/22, repeat potassium on Monday  17. Right hand swelling: US  reviewed and is negative for clot- discussed with patient  18. RLE swelling: Vas US  ordered- discussed that this was negative for clot, discussed that swelling has improved after discontinuation of amlodipine , BP reviewed and remains stable off amlodipine   19.  Left wrist soreness: Suspect mild sprain after fall.  No fractures noted on x-ray.  -1/25 Voltaren  gel ordered   LOS: 17 days A FACE TO FACE EVALUATION WAS PERFORMED  Murray Collier 02/22/2024, 3:01 PM     "

## 2024-02-23 LAB — BASIC METABOLIC PANEL WITH GFR
Anion gap: 7 (ref 5–15)
BUN: 16 mg/dL (ref 8–23)
CO2: 28 mmol/L (ref 22–32)
Calcium: 8.6 mg/dL — ABNORMAL LOW (ref 8.9–10.3)
Chloride: 104 mmol/L (ref 98–111)
Creatinine, Ser: 0.92 mg/dL (ref 0.44–1.00)
GFR, Estimated: >60 mL/min
Glucose, Bld: 110 mg/dL — ABNORMAL HIGH (ref 70–99)
Potassium: 3.4 mmol/L — ABNORMAL LOW (ref 3.5–5.1)
Sodium: 140 mmol/L (ref 135–145)

## 2024-02-23 LAB — CBC WITH DIFFERENTIAL/PLATELET
Abs Immature Granulocytes: 0.02 10*3/uL (ref 0.00–0.07)
Basophils Absolute: 0 10*3/uL (ref 0.0–0.1)
Basophils Relative: 0 %
Eosinophils Absolute: 0.1 10*3/uL (ref 0.0–0.5)
Eosinophils Relative: 2 %
HCT: 34.6 % — ABNORMAL LOW (ref 36.0–46.0)
Hemoglobin: 11.1 g/dL — ABNORMAL LOW (ref 12.0–15.0)
Immature Granulocytes: 0 %
Lymphocytes Relative: 45 %
Lymphs Abs: 2.9 10*3/uL (ref 0.7–4.0)
MCH: 22.5 pg — ABNORMAL LOW (ref 26.0–34.0)
MCHC: 32.1 g/dL (ref 30.0–36.0)
MCV: 70 fL — ABNORMAL LOW (ref 80.0–100.0)
Monocytes Absolute: 0.5 10*3/uL (ref 0.1–1.0)
Monocytes Relative: 8 %
Neutro Abs: 2.9 10*3/uL (ref 1.7–7.7)
Neutrophils Relative %: 45 %
Platelets: 252 10*3/uL (ref 150–400)
RBC: 4.94 MIL/uL (ref 3.87–5.11)
RDW: 16.7 % — ABNORMAL HIGH (ref 11.5–15.5)
WBC: 6.5 10*3/uL (ref 4.0–10.5)
nRBC: 0 % (ref 0.0–0.2)

## 2024-02-23 MED ORDER — POTASSIUM CHLORIDE 20 MEQ PO PACK
40.0000 meq | PACK | Freq: Once | ORAL | Status: AC
  Administered 2024-02-23: 40 meq via ORAL
  Filled 2024-02-23: qty 2

## 2024-02-23 NOTE — Progress Notes (Signed)
 Occupational Therapy Session Note  Patient Details  Name: Marisa [name] MRN: [id number] Date of Birth: [birth year]  Today's Date: 02/23/2024 OT Individual Time: 1120-1200 OT Individual Time Calculation (min): 40 min    Short Term Goals: Week 3:  OT Short Term Goal 1 (Week 3): Pt will keep RUE in safe place 100% of the time with min questioning cues for 2 consecutive sessions OT Short Term Goal 2 (Week 3): Pt will complete functional bed mobility with min A OT Short Term Goal 3 (Week 3): Pt will complete LB dressing with AE PRN with mod A  Skilled Therapeutic Interventions/Progress Updates:   Patient presents seated in wheelchair.  Patient agreeable to OT session.  Transported to gym to address neuromuscular reeducation to RUE.   Patient completed stand pivot transfer with mod assist.  Patient needs riser under feet to maintain contact with surface.   Worked on alignment and sitting balance. Worked on shifting weight toward right with right hip aligned.  Long arm weight bearing through right arm in sitting, and followed with active movement - shoulder flexion/ extension, adduction, elbow flexion and extension.   Patient transferred back to chair and transported to room, opting to stay up in chair for lunch.  Husband at bedside.    Therapy Documentation Precautions:  Precautions Precautions: Fall Recall of Precautions/Restrictions: Intact Precaution/Restrictions Comments: R dense hemi Restrictions Weight Bearing Restrictions Per Provider Order: No   Pain:  Denies pain     Therapy/Group: Individual Therapy  Eulala Newcombe M 02/23/2024, 12:07 PM

## 2024-02-23 NOTE — Progress Notes (Signed)
 Occupational Therapy Session Note  Patient Details  Name: Marisa [name] MRN: [id number] Date of Birth: 12/22/1960  Today's Date: 02/23/2024 OT Individual Time: 1330-1415 OT Individual Time Calculation (min): 45 min    Short Term Goals: Week 3:  OT Short Term Goal 1 (Week 3): Pt will keep RUE in safe place 100% of the time with min questioning cues for 2 consecutive sessions OT Short Term Goal 2 (Week 3): Pt will complete functional bed mobility with min A OT Short Term Goal 3 (Week 3): Pt will complete LB dressing with AE PRN with mod A  Skilled Therapeutic Interventions/Progress Updates:      Therapy Documentation Precautions:  Precautions Precautions: Fall Recall of Precautions/Restrictions: Intact Precaution/Restrictions Comments: R dense hemi Restrictions Weight Bearing Restrictions Per Provider Order: No General:  Pt seated in W/C upon OT arrival, agreeable to OT. Husband present   Pain: no pain reported  Exercises: OT providing skilled intervention on NMRE, ROM, proprioception and strength of RUE. OT placing pt arm in saebo mobile arm support and placing estim on triceps. OT instructing pt to straighten arm when estim on and bring arm back when off. Initially, OT providing heavy AAROM although throughout trials, AAROM decreased with pt able to move shoulder/triceps more independently in gravity eliminated position. OT leaving estim on after activity to finish cycle for increased NMRE and muscle activation, although OT placing estim on deltoid for prevention of sublux.   Other Treatments: OT providing information on [employer] unit for home for pt and husband. OT educating on difference b/w TENS and NMES and why NMES is required. Pt and husband with good understanding.    Pt seated in W/C at end of session with W/C alarm donned, call light within reach and 4Ps assessed. Husband present.    Therapy/Group: Individual Therapy  Camie Hoe, OTD, OTR/L 02/23/2024, 4:13  PM

## 2024-02-23 NOTE — Plan of Care (Signed)
" °  Problem: Consults Goal: RH STROKE PATIENT EDUCATION Description: See Patient Education module for education specifics  Outcome: Progressing Goal: Nutrition Consult-if indicated Outcome: Progressing Goal: Diabetes Guidelines if Diabetic/Glucose > 140 Description: If diabetic or lab glucose is > 140 mg/dl - Initiate Diabetes/Hyperglycemia Guidelines & Document Interventions  Outcome: Progressing   Problem: RH BOWEL ELIMINATION Goal: RH STG MANAGE BOWEL WITH ASSISTANCE Description: STG Manage Bowel with mod I Assistance. Outcome: Progressing Goal: RH STG MANAGE BOWEL W/MEDICATION W/ASSISTANCE Description: STG Manage Bowel with Medication with mod I Assistance. Outcome: Progressing   Problem: RH BLADDER ELIMINATION Goal: RH STG MANAGE BLADDER WITH ASSISTANCE Description: STG Manage Bladder With toileting Assistance Outcome: Progressing   Problem: RH SAFETY Goal: RH STG ADHERE TO SAFETY PRECAUTIONS W/ASSISTANCE/DEVICE Description: STG Adhere to Safety Precautions With cues Assistance/Device. Outcome: Progressing   Problem: RH KNOWLEDGE DEFICIT Goal: RH STG INCREASE KNOWLEDGE OF DIABETES Description: Patient and spouse will be able to manage DM using educational resources for medications and dietary modification independently Outcome: Progressing Goal: RH STG INCREASE KNOWLEDGE OF HYPERTENSION Description: Patient and spouse will be able to manage HTN using educational resources for medications and dietary modification independently Outcome: Progressing Goal: RH STG INCREASE KNOWLEGDE OF HYPERLIPIDEMIA Description: Patient and spouse will be able to manage HLD using educational resources for medications and dietary modification independently Outcome: Progressing Goal: RH STG INCREASE KNOWLEDGE OF STROKE PROPHYLAXIS Description: Patient and spouse will be able to manage secondary risks using educational resources for medications and dietary modification independently Outcome:  Progressing   "

## 2024-02-23 NOTE — Progress Notes (Signed)
 Physical Therapy Note  Patient Details  Name: Marisa [name] MRN: [id number] Date of Birth: 07-16-1960 Today's Date: 02/23/2024    Due to the [place]  state of emergency, patients may not receive 3.0 hours of therapy services.    [name] 02/23/2024, 11:05 AM

## 2024-02-23 NOTE — Progress Notes (Signed)
 Speech Language Pathology Daily Session Note  Patient Details  Name: Marisa [name] MRN: [id number] Date of Birth: [birth year]  Today's Date: 02/23/2024 SLP Individual Time: 1445-1530 SLP Individual Time Calculation (min): 45 min  Short Term Goals: Week 2: SLP Short Term Goal 1 (Week 2): Patient will tolerate regular/thin liquid diet with modI for use of oral clearance strategies. SLP Short Term Goal 2 (Week 2): Patient will attend to functional therapy tasks for 30 minutes with supervision assist. SLP Short Term Goal 3 (Week 2): Patient will recall daily events using external and internal memory aids with supervision assist. SLP Short Term Goal 4 (Week 2): Patient will solve mildly complex functional problems with supervision assist. SLP Short Term Goal 5 (Week 2): Patient will utilize speech intelligibility strategies at the sentence level with supervision assist.  Skilled Therapeutic Interventions:   Pt greeted in her room for tx targeting cognition and motor speech. She was up in her WC very agreeable to tx tasks. She was able to recall info/education from the last week w/ supervisionA. Throughout conversation, improved articulatory precision was noted as compared to last tx session w/ this SLP on 1/19. Speech intelligibility remains 100%. SLP did introduce IOPI to target labial and anterior lingual strength given pt concerns. After demonstration, she was able to complete 10 trials @ 10 kpa in the labial position and 10 trials @ 35 kpa in the anterior lingual position w/ only supervisionA to maintain adequate technique. At the end of tx tasks, she was left in her [facility] w/ the alarm set and call light within reach. Recommend cont ST per POC.   Pain  none  Therapy/Group: Individual Therapy  Recardo DELENA Mole 02/23/2024, 5:24 PM

## 2024-02-23 NOTE — Progress Notes (Signed)
 "                                                        PROGRESS NOTE   Subjective/Complaints: No new complaints this morning Patient's chart reviewed- No issues reported overnight Vitals signs stable   ROS: +right sided hemiplegia- continues See HPI,  denies fevers/chills, CP, SOB, abd pain, n/v/d/c fatigue has decreased, +mild constipation +L wrist soreness   Objective:   CT HEAD WO CONTRAST ( ) Result Date: 02/21/2024 EXAM: CT HEAD WITHOUT CONTRAST 02/21/2024 05:33:05 PM TECHNIQUE: CT of the head was performed without the administration of intravenous contrast. Automated exposure control, iterative reconstruction, and/or weight based adjustment of the mA/kV was utilized to reduce the radiation dose to as low as reasonably achievable. COMPARISON: 12/28/2024 CLINICAL HISTORY: Head trauma, minor, normal mental status (Age 46-64y); fall, eval for trauma/injury. Head trauma, minor, normal mental status (Age 46-64 years); fall, evaluation for trauma or injury. FINDINGS: BRAIN AND VENTRICLES: No acute hemorrhage. Expected continued evolution of infarct involving the Left Corona Radiata and Left Lentiform Nucleus. No hydrocephalus. No extra-axial collection. No mass effect or midline shift. Mild atherosclerosis in the Carotid Siphons. ORBITS: No acute abnormality. SINUSES: No acute abnormality. SOFT TISSUES AND SKULL: No acute soft tissue abnormality. No skull fracture. IMPRESSION: 1. No acute intracranial abnormality related to the reported head trauma. 2. Expected continued evolution of infarct involving the left corona radiata and left lentiform nucleus. Electronically signed by: Donnice Mania MD 02/21/2024 05:44 PM EST RP Workstation: [id number]   DG Wrist Complete Left Result Date: 02/21/2024 EXAM: 2 VIEW(S) XRAY OF THE LEFT WRIST 02/21/2024 05:38:00 PM COMPARISON: None available. CLINICAL HISTORY: Fall, left wrist pain. FINDINGS: BONES AND JOINTS: No acute fracture. No malalignment. SOFT  TISSUES: Unremarkable. IMPRESSION: 1. No acute fracture or dislocation. Electronically signed by: Oneil Devonshire MD 02/21/2024 05:40 PM EST RP Workstation: [id number]     Recent Labs    02/23/24 0440  WBC 6.5  HGB 11.1*  HCT 34.6*  PLT 252     Recent Labs    02/23/24 0440  NA 140  K 3.4*  CL 104  CO2 28  GLUCOSE 110*  BUN 16  CREATININE 0.92  CALCIUM  8.6*    Intake/Output Summary (Last 24 hours) at 02/23/2024 0952 Last data filed at 02/23/2024 0742 Gross per 24 hour  Intake 598 ml  Output --  Net 598 ml        Physical Exam: Vital Signs Blood pressure 124/66, pulse (!) 57, temperature (!) 97.5 F (36.4 C), temperature source Oral, resp. rate 17, height 5' (1.524 m), weight 105.7 kg, SpO2 97%.  Gen: no distress, sitting upright in bed, comfortable HEENT: oral mucosa pink and moist, NCAT Cardio: bradycardic, reg rhythm, no m/r/g appreciated Chest: normal effort, normal rate of breathing, CTAB Abd: soft, non-distended, nontender, +BS throughout Ext: BLE nonpitting edema, improved in RLE Psych: pleasant, normal affect, interactive Skin: intact to exposed surfaces Neuro: Alert and oriented x3, 5/5 strength on left side, 0/5 strength throughout right UE, right lower extremity with 3/5 KE and is otherwise 0/5, sensation is intact, stable 02/23/24 MSK: No significant wrist tenderness, minimal pain with wrist ROM.  She points to mild area of soreness left lateral wrist.   Assessment/Plan: 1. Functional deficits which require 3+ hours per day of interdisciplinary therapy in  a comprehensive inpatient rehab setting. Physiatrist is providing close team supervision and 24 hour management of active medical problems listed below. Physiatrist and rehab team continue to assess barriers to discharge/monitor patient progress toward functional and medical goals  Care Tool:  Bathing    Body parts bathed by patient: Right arm, Chest, Abdomen, Front perineal area, Buttocks, Right  upper leg, Left upper leg, Face   Body parts bathed by helper: Left arm, Right lower leg, Right upper leg     Bathing assist Assist Level: Minimal Assistance - Patient > 75%     Upper Body Dressing/Undressing Upper body dressing   What is the patient wearing?: Pull over shirt    Upper body assist Assist Level: Minimal Assistance - Patient > 75%    Lower Body Dressing/Undressing Lower body dressing      What is the patient wearing?: Pants, Underwear/pull up     Lower body assist Assist for lower body dressing: Maximal Assistance - Patient 25 - 49%     Toileting Toileting    Toileting assist Assist for toileting: Moderate Assistance - Patient 50 - 74%     Transfers Chair/bed transfer  Transfers assist     Chair/bed transfer assist level: Minimal Assistance - Patient > 75%     Locomotion Ambulation   Ambulation assist      Assist level: 2 helpers Assistive device: Other (comment) (3-muskateers) Max distance: 40'   Walk 10 feet activity   Assist     Assist level: 2 helpers Assistive device: Other (comment) (3-muskateers)   Walk 50 feet activity   Assist Walk 50 feet with 2 turns activity did not occur: Safety/medical concerns (weakness, fatigue)  Assist level: 2 helpers Assistive device: Other (comment) (3-muskateers)    Walk 150 feet activity   Assist Walk 150 feet activity did not occur: Safety/medical concerns         Walk 10 feet on uneven surface  activity   Assist Walk 10 feet on uneven surfaces activity did not occur: Safety/medical concerns         Wheelchair     Assist Is the patient using a wheelchair?: Yes Type of Wheelchair: Manual    Wheelchair assist level: Supervision/Verbal cueing Max wheelchair distance: 179ft    Wheelchair 50 feet with 2 turns activity    Assist        Assist Level: Supervision/Verbal cueing   Wheelchair 150 feet activity     Assist      Assist Level: Dependent -  Patient 0%   Blood pressure 124/66, pulse (!) 57, temperature (!) 97.5 F (36.4 C), temperature source Oral, resp. rate 17, height 5' (1.524 m), weight 105.7 kg, SpO2 97%.  Medical Problem List and Plan: 1. 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 Continue Asa and Plavix  with statin. Follow up with Dr. Chalice in 4 weeks.              -patient may  shower -ELOS/Goals: 14-18 days due to R hemiplegia- Min A hopefully, goals, mod I to supervision for SLP/dysarthria Chart and therapy notes reviewed, continue CIR, discussed patient's progress with therapy Grounds pass ordered Vitamin D3/Metanx/Vitamin B/C complex ordered F/u with Dr. JONELLE or Fidela in clinic within 1 month Would benefit from home health aide upon discharge  Continue CIR, d/c goal 1/29 -02/21/24 fall in hallway during PT, witnessed, hit her head, will order head CT. No other injuries. Nursing to call if other aches/pains arise-CT  head stable.  Left wrist x-ray without acute fracture.   2.  Antithrombotics: continue Lovenox  40mg  daily; antiplatelet ASA and plavix  daily   3. Pain Management: Tylenol  prn    4. Mood/Behavior/Sleep: LCSW to follow for evaluation and support when available.              -antipsychotic agents: N/A  -trazodone  PRN   5. Neuropsych/cognition: This patient is capable of making decisions on her own behalf.   6. Skin/Wound Care: Routine pressure relief measures.    7. FEN/AKI: encouraged oral hydration, continue vitamins/supplements, continue routine labs.    8. Screening for vitamin D  deficiency: D level 28.1 on 02/06/24, continue D3 2,000U daily   9. HTN: BP reviewed and is normal, amlodipine  d/ced due to leg swelling, d/c chorthalidone 25mg  daily, d/c avapro , continue Bystolic  10mg  daily.  Monitor BP with activity. Hydrochlorothiazide  6.25mg  daily added due to leg swelling Vitals:   02/21/24 1834 02/21/24 1928 02/21/24 2129 02/21/24 2331   BP: 132/67 135/68 131/60 (!) 125/57   02/22/24 0134 02/22/24 0331 02/22/24 0526 02/22/24 0855  BP: (!) 132/57 (!) 123/59 129/76 131/70   02/22/24 1106 02/22/24 1631 02/22/24 1918 02/23/24 0436  BP: 126/69 134/77 131/71 124/66    10. DM2: A1c 7.2, Home metformin  held, d/c Farxiga . D/c ISS, CBGs reviewed and remain well controlled, will discontinue CBG checks and we can monitor fasting glucose with biweekly BMPs  CBG (last 3)  No results for input(s): GLUCAP in the last 72 hours.    11. HLD: continue  Lipitor 80 mg daily, discussed that repeat lipid panel shows improved LDL and total cholesterol and worse HDL   12. OSA: continue CPAP nightly    13. Left Thyroid Nodule: 2.2 cm thyroid nodule found on CT.  Follow up outpatient for non emergent thyroid US .    14. Class 4 Obesity:  BMI is 47.85 kg/m . Educate on diet and weight loss to promote overall health and mobility. Continue magnesium  supplement   15. Constipation- will order Miralax  daily that she's taking as well as Dulcolax suppository tonight to help her get back on schedule. - She also wants apple juice. Mag oxide added HS. Continue senokot s 2 tabs nightly. LBM 1/23- messaged nursing to confirm accuracy  16. Hypokalemia: klor ordered on 1/19, kdur ordered 1/20, 40 klor ordered 1/22, 40 klor ordered 1/26  17. Right hand swelling: US  reviewed and is negative for clot- discussed with patient  79. RLE swelling: Vas US  ordered- discussed that this was negative for clot, discussed that swelling has improved after discontinuation of amlodipine , BP reviewed and remains stable off amlodipine   19.  Left wrist soreness: Suspect mild sprain after fall.  No fractures noted on x-ray. Continue Voltaren  gel ordered   LOS: 18 days A FACE TO FACE EVALUATION WAS PERFORMED  Marisa [name] 02/23/2024, 9:52 AM     "

## 2024-02-23 NOTE — Progress Notes (Signed)
 Speech Language Pathology Weekly Progress and Session Note  Patient Details  Name: Marisa [name] MRN: [id number] Date of Birth: 25-Mar-1960  Beginning of progress report period: February 13, 2024 End of progress report period: February 23, 2024   Short Term Goals: Week 2: SLP Short Term Goal 1 (Week 2): Patient will tolerate regular/thin liquid diet with modI for use of oral clearance strategies. SLP Short Term Goal 1 - Progress (Week 2): Met SLP Short Term Goal 2 (Week 2): Patient will attend to functional therapy tasks for 30 minutes with supervision assist. SLP Short Term Goal 2 - Progress (Week 2): Met SLP Short Term Goal 3 (Week 2): Patient will recall daily events using external and internal memory aids with supervision assist. SLP Short Term Goal 3 - Progress (Week 2): Met SLP Short Term Goal 4 (Week 2): Patient will solve mildly complex functional problems with supervision assist. SLP Short Term Goal 4 - Progress (Week 2): Progressing toward goal SLP Short Term Goal 5 (Week 2): Patient will utilize speech intelligibility strategies at the sentence level with supervision assist. SLP Short Term Goal 5 - Progress (Week 2): Met    New Short Term Goals: Week 3: SLP Short Term Goal 1 (Week 3): STGs = LTGs d/t ELOS  Weekly Progress Updates: Pt has made great progress this week as evidenced by mastery of 4/5 STGs. She presents w/ improved STM, problem solving, speech intelligibility, and attention. Emerging success noted w/ complex problem solving goal, though success remains variable from supervision to minA. Additionally, she continues to tolerate a regular diet nad thin liquids and clear oral cavity @ modI. Pt/family education ongoing. She would benefit from continued ST to target remaining cognitive-communication deficits, ensure continued diet tolerance, and maximize pt independence.    Intensity: Minumum of 1-2 x/day, 30 to 90 minutes Frequency: 3 to 5 out of 7  days Duration/Length of Stay: 1/29 Treatment/Interventions: Cognitive remediation/compensation;Environmental controls;Cueing hierarchy;Functional tasks;Internal/external aids;Dysphagia/aspiration precaution training;Patient/family education;Therapeutic Activities;Speech/Language facilitation   Recardo DELENA Mole 02/23/2024, 9:23 PM

## 2024-02-24 LAB — BASIC METABOLIC PANEL WITH GFR
Anion gap: 9 (ref 5–15)
BUN: 14 mg/dL (ref 8–23)
CO2: 27 mmol/L (ref 22–32)
Calcium: 9.1 mg/dL (ref 8.9–10.3)
Chloride: 103 mmol/L (ref 98–111)
Creatinine, Ser: 0.78 mg/dL (ref 0.44–1.00)
GFR, Estimated: >60 mL/min
Glucose, Bld: 140 mg/dL — ABNORMAL HIGH (ref 70–99)
Potassium: 3.9 mmol/L (ref 3.5–5.1)
Sodium: 140 mmol/L (ref 135–145)

## 2024-02-24 LAB — MAGNESIUM: Magnesium: 2.1 mg/dL (ref 1.7–2.4)

## 2024-02-24 MED ORDER — MAGNESIUM CITRATE PO SOLN
1.0000 | Freq: Once | ORAL | Status: DC
  Filled 2024-02-24: qty 296

## 2024-02-24 NOTE — Progress Notes (Signed)
 Physical Therapy Session Note  Patient Details  Name: Marisa [name] MRN: [id number] Date of Birth: 1960-03-23  Today's Date: 02/24/2024 PT Individual Time: 8884-8843 and 8669-8587 PT Individual Time Calculation (min): 41 min and 42 min  Concurrent Treatment with OT: 11:00-11:30 (30 mins)  Short Term Goals: Week 1:  PT Short Term Goal 1 (Week 1): pt will transfer supine<>sitting EOB with mod A consistantly PT Short Term Goal 1 - Progress (Week 1): Met PT Short Term Goal 2 (Week 1): pt will transfer sit<>stand with LRAD and min A PT Short Term Goal 2 - Progress (Week 1): Met PT Short Term Goal 3 (Week 1): pt will transfer bed<>chair with LRAD and min A PT Short Term Goal 3 - Progress (Week 1): Progressing toward goal Week 2:  PT Short Term Goal 1 (Week 2): Pt will transfer supine<>sitting EOB with min A consistantly PT Short Term Goal 1 - Progress (Week 2): Met PT Short Term Goal 2 (Week 2): pt will transfer bed<>chair with LRAD and min A PT Short Term Goal 2 - Progress (Week 2): Met PT Short Term Goal 3 (Week 2): pt will ambulate 92ft with LRAD and max A of 1 PT Short Term Goal 3 - Progress (Week 2): Met  Skilled Therapeutic Interventions/Progress Updates:   Treatment Session 1 Received pt sitting in WC, pt agreeable to PT treatment, and denied any pain during session. Pt reported urge to void - stood in [place] with min A and transferred to/from toilet with bedside commode over top dependently for time management purposes. Pt required assist for clothing management and able to void. Stood to perform immunologist with CGA for balance, then returned to [facility]. Session with emphasis on custom manual WC evaluation with Penne from [employer] and primary OT, Hope. Pt transported to dayroom in [facility] dependently for time management purposes. Donned shoes and R AFO with max A and discussed getting lightweight Helio A6 with the following features: - R slim line half lap tray - hemi height to  allow for independence with WC propulsion - tension adjustable back support - swing away armrests and legrests   Discussed extending D/C date as pt's ramp will not be delivered until Friday and pt also needs more hands on family education prior to D/C - treatment team aware and discussing potential Saturday or early next week. Loaner WC to be delivered on Thursday. Pt's extended family arrived and pt left sitting in [facility] with all needs within reach and family at bedside.   Treatment Session 2 Received pt transferring to toilet in [place] with NT - PT took over with care. Pt agreeable to PT treatment and denied any pain during session. Session with emphasis on functional mobility/transfers, toileting, simulated car transfers, and family education with husband. Pt able to void and performed hygiene management in standing with CGA for balance. Returned to [facility] in [place] dependently - pt's husband reported smallest doorway at home is 27.5in wide - pt's WC will be 24in wide with rims. Pt transported to/from room in [facility] dependently for time management purposes. Pt performed simulated car transfer via stand<>pivot with mod A x 2 trials - trial 1 with therapist and trial 2 with husband. Pt able to perform stand<>pivot with min A but required mod A for RLE management. Educated pt's husband on WC positioning for transfer, body mechanics/hand placement, and use of gait belt. Then educated pt's husband on WC parts management, including donning and doffing legrests (did so with supervision). Pt then  propelled up/down 4ft ramp ascending backwards using LUE/LLE and min A and descended forwards without assist. Pt's husband plans to take pictures of their home entrance but reports hopefully ramp will be delivered on Friday. Returned to room and pt reported urge to void again. Stood in [place] with min A and transported to toilet with bedside commode over top dependently. Pt required assist for clothing management and left in care of  NT due to time restrictions.   Therapy Documentation Precautions:  Precautions Precautions: Fall Recall of Precautions/Restrictions: Intact Precaution/Restrictions Comments: R dense hemi Restrictions Weight Bearing Restrictions Per Provider Order: No  Therapy/Group: Individual Therapy Therisa HERO Zaunegger Therisa Stains PT, DPT 02/24/2024, 6:48 AM

## 2024-02-24 NOTE — Plan of Care (Signed)
" °  Problem: Consults Goal: RH STROKE PATIENT EDUCATION Description: See Patient Education module for education specifics  Outcome: Progressing Goal: Nutrition Consult-if indicated Outcome: Progressing Goal: Diabetes Guidelines if Diabetic/Glucose > 140 Description: If diabetic or lab glucose is > 140 mg/dl - Initiate Diabetes/Hyperglycemia Guidelines & Document Interventions  Outcome: Progressing   Problem: RH BOWEL ELIMINATION Goal: RH STG MANAGE BOWEL WITH ASSISTANCE Description: STG Manage Bowel with mod I Assistance. Outcome: Progressing Goal: RH STG MANAGE BOWEL W/MEDICATION W/ASSISTANCE Description: STG Manage Bowel with Medication with mod I Assistance. Outcome: Progressing   Problem: RH BLADDER ELIMINATION Goal: RH STG MANAGE BLADDER WITH ASSISTANCE Description: STG Manage Bladder With toileting Assistance Outcome: Progressing   Problem: RH SAFETY Goal: RH STG ADHERE TO SAFETY PRECAUTIONS W/ASSISTANCE/DEVICE Description: STG Adhere to Safety Precautions With cues Assistance/Device. Outcome: Progressing   Problem: RH KNOWLEDGE DEFICIT Goal: RH STG INCREASE KNOWLEDGE OF DIABETES Description: Patient and spouse will be able to manage DM using educational resources for medications and dietary modification independently Outcome: Progressing Goal: RH STG INCREASE KNOWLEDGE OF HYPERTENSION Description: Patient and spouse will be able to manage HTN using educational resources for medications and dietary modification independently Outcome: Progressing Goal: RH STG INCREASE KNOWLEGDE OF HYPERLIPIDEMIA Description: Patient and spouse will be able to manage HLD using educational resources for medications and dietary modification independently Outcome: Progressing Goal: RH STG INCREASE KNOWLEDGE OF STROKE PROPHYLAXIS Description: Patient and spouse will be able to manage secondary risks using educational resources for medications and dietary modification independently Outcome:  Progressing   "

## 2024-02-24 NOTE — Progress Notes (Signed)
 Patient ID: Marisa [name], female   DOB: 01-30-1960, 64 y.o.   MRN: [id number]  Left a voicemail with patient's spouse to schedule family education for the following days; Wednesday, Thursday, and Friday. This will allow the family to address 24/7 care needs and it will also give time for DME to get in.   Discharge date updated to Saturday 01/31

## 2024-02-24 NOTE — Progress Notes (Signed)
 Occupational Therapy Session Note  Patient Details  Name: Marisa [name] MRN: [id number] Date of Birth: 1960/09/28  Today's Date: 02/24/2024 OT Individual Time: 9052-8969 OT Individual Time Calculation (min): 43 min   Short Term Goals: Week 3:  OT Short Term Goal 1 (Week 3): Pt will keep RUE in safe place 100% of the time with min questioning cues for 2 consecutive sessions OT Short Term Goal 2 (Week 3): Pt will complete functional bed mobility with min A OT Short Term Goal 3 (Week 3): Pt will complete LB dressing with AE PRN with mod A  Skilled Therapeutic Interventions/Progress Updates:  Session 1 Skilled OT intervention completed with focus on ADL retraining, DC planning. Pt received seated in w/c requesting to wash up. No pain reported.  Re-educated offered on proper doffing method of shirt for increased independence via pulling from behind head. Pt supervision for this, with verbal cues only. Educated pt on propping RUE on sink in safe place or EOB to increase shoulder abduction for increased access of washing underarm, in which pt was able to do with supervision with this technique. Did need support to stabilize, then was able to use L hand to wash L underarm but OT needed for thoroughness. Similar assist for drying and donning deodorant. Pt with good recall of hemi strategy for donning UB shirt, however needed cues to pull shirt up to R shoulder above elbow prior to donning overhead for increased efficiency. Overall supervision for shirt.   Discussed pt's DC plan. Family/pt have not returned home measurement sheet, therefore unknown accessibility. Assisted pt with sending this via her phone. Secure chat sent to team about OT recommendation to extend pt DC as ramp wont be delivered until Friday, family has not completed hands on education with self-care, and pt with concerns about family assisting especially husband without use of stedy (which will need time to be delivered). Labs  present for blood draw. Pt remained seated in w/c, with chair alarm on/activated, and with all needs in reach at end of session.  Sa[place]o Stim One  Applied for 60 mins (unattended) to the RUE deltoid for 60 min for NMR and joint approximation with the following parameters:  330 pulse width 35 Hz pulse rate On 8 sec/ off 8 sec Ramp up/ down 2 sec Symmetrical Biphasic wave form  Max intensity at 500 Ohm load  Removed from pt at end of cycle with no adverse skin reaction or irritation noted.   Therapy Documentation Precautions:  Precautions Precautions: Fall Recall of Precautions/Restrictions: Intact Precaution/Restrictions Comments: R dense hemi Restrictions Weight Bearing Restrictions Per Provider Order: No    Therapy/Group: Individual Therapy  Marisa Caniglia E Nikolos Billig, MS, OTR/L  02/24/2024, 12:00 PM

## 2024-02-24 NOTE — Progress Notes (Signed)
 Speech Language Pathology Daily Session Note  Patient Details  Name: Marisa [name] MRN: [id number] Date of Birth: 29-Dec-1960  Today's Date: 02/24/2024 SLP Individual Time: 1500-1557 SLP Individual Time Calculation (min): 57 min  Short Term Goals: Week 3: SLP Short Term Goal 1 (Week 3): STGs = LTGs d/t ELOS  Skilled Therapeutic Interventions: SLP conducted skilled therapy session targeting cognitive goals. Patient completed mildly complex deductive reasoning puzzle with supervision assist. Patient then completed IOPI tongue and labial strengthening exercises, completing 3 sets at 47 kPa in anterior tongue position and 3 sets of 10 repetitions at 10 kPa between teeth and R buccal cavity. She completed all IOPI related exercises with supervision once bulb was placed in accurate position. Patient was left in room with call bell in reach and alarm set. SLP will continue to target goals per plan of care.        Pain  none  Therapy/Group: Individual Therapy  Letricia Krinsky, M.A., CCC-SLP  Ami Mally A Dyanna Seiter 02/24/2024, 4:00 PM

## 2024-02-24 NOTE — Progress Notes (Signed)
 "                                                        PROGRESS NOTE   Subjective/Complaints: Complains of constipation, magnesium  citrate ordered BMP and Magnesium  level ordered today She asks about ordering a tens unit for home  ROS: +right sided hemiplegia- continues See HPI,  denies fevers/chills, CP, SOB, abd pain, n/v/d/c fatigue has decreased, +mild constipation- continues +L wrist soreness   Objective:   No results found.    Recent Labs    02/23/24 0440  WBC 6.5  HGB 11.1*  HCT 34.6*  PLT 252     Recent Labs    02/23/24 0440  NA 140  K 3.4*  CL 104  CO2 28  GLUCOSE 110*  BUN 16  CREATININE 0.92  CALCIUM  8.6*    Intake/Output Summary (Last 24 hours) at 02/24/2024 0943 Last data filed at 02/24/2024 0700 Gross per 24 hour  Intake 652 ml  Output 200 ml  Net 452 ml        Physical Exam: Vital Signs Blood pressure 137/70, pulse 72, temperature (!) 97.5 F (36.4 C), temperature source Oral, resp. rate 16, height 5' (1.524 m), weight 105.7 kg, SpO2 97%.  Gen: no distress, sitting upright in bed, comfortable HEENT: oral mucosa pink and moist, NCAT Cardio: bradycardic, reg rhythm, no m/r/g appreciated Chest: normal effort, normal rate of breathing, CTAB Abd: soft, non-distended, nontender, +BS throughout Ext: BLE nonpitting edema, improved in RLE Psych: pleasant, normal affect, interactive Skin: intact to exposed surfaces Neuro: Alert and oriented x3, 5/5 strength on left side, 0/5 strength throughout right UE, right lower extremity with 3/5 KE and is otherwise 0/5, sensation is intact, stable 1/27 MSK: No significant wrist tenderness, minimal pain with wrist ROM.  She points to mild area of soreness left lateral wrist.   Assessment/Plan: 1. Functional deficits which require 3+ hours per day of interdisciplinary therapy in a comprehensive inpatient rehab setting. Physiatrist is providing close team supervision and 24 hour management of active  medical problems listed below. Physiatrist and rehab team continue to assess barriers to discharge/monitor patient progress toward functional and medical goals  Care Tool:  Bathing    Body parts bathed by patient: Right arm, Chest, Abdomen, Front perineal area, Buttocks, Right upper leg, Left upper leg, Face   Body parts bathed by helper: Left arm, Right lower leg, Right upper leg     Bathing assist Assist Level: Minimal Assistance - Patient > 75%     Upper Body Dressing/Undressing Upper body dressing   What is the patient wearing?: Pull over shirt    Upper body assist Assist Level: Minimal Assistance - Patient > 75%    Lower Body Dressing/Undressing Lower body dressing      What is the patient wearing?: Pants, Underwear/pull up     Lower body assist Assist for lower body dressing: Maximal Assistance - Patient 25 - 49%     Toileting Toileting    Toileting assist Assist for toileting: Moderate Assistance - Patient 50 - 74%     Transfers Chair/bed transfer  Transfers assist     Chair/bed transfer assist level: Minimal Assistance - Patient > 75%     Locomotion Ambulation   Ambulation assist      Assist level: 2 helpers Assistive device:  Other (comment) (3-muskateers) Max distance: 61'   Walk 10 feet activity   Assist     Assist level: 2 helpers Assistive device: Other (comment) (3-muskateers)   Walk 50 feet activity   Assist Walk 50 feet with 2 turns activity did not occur: Safety/medical concerns (weakness, fatigue)  Assist level: 2 helpers Assistive device: Other (comment) (3-muskateers)    Walk 150 feet activity   Assist Walk 150 feet activity did not occur: Safety/medical concerns         Walk 10 feet on uneven surface  activity   Assist Walk 10 feet on uneven surfaces activity did not occur: Safety/medical concerns         Wheelchair     Assist Is the patient using a wheelchair?: Yes Type of Wheelchair: Manual     Wheelchair assist level: Supervision/Verbal cueing Max wheelchair distance: 143ft    Wheelchair 50 feet with 2 turns activity    Assist        Assist Level: Supervision/Verbal cueing   Wheelchair 150 feet activity     Assist      Assist Level: Dependent - Patient 0%   Blood pressure 137/70, pulse 72, temperature (!) 97.5 F (36.4 C), temperature source Oral, resp. rate 16, height 5' (1.524 m), weight 105.7 kg, SpO2 97%.  Medical Problem List and Plan: 1. 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 Continue Asa and Plavix  with statin. Follow up with Dr. Chalice in 4 weeks.              -patient may  shower -ELOS/Goals: 14-18 days due to R hemiplegia- Min A hopefully, goals, mod I to supervision for SLP/dysarthria Chart and therapy notes reviewed, continue CIR, discussed patient's progress with therapy Grounds pass ordered Vitamin D3/Metanx/Vitamin B/C complex ordered F/u with Dr. JONELLE or Fidela in clinic within 1 month Would benefit from home health aide upon discharge  Continue CIR, d/c goal 1/29 Estim ordered for disuse atrophy and non-narcotic treatment of pain (left wrist)   2.  Antithrombotics: continue Lovenox  40mg  daily; antiplatelet ASA and plavix  daily   3. Pain Management: Tylenol  prn    4. Mood/Behavior/Sleep: LCSW to follow for evaluation and support when available.              -antipsychotic agents: N/A  -trazodone  PRN   5. Neuropsych/cognition: This patient is capable of making decisions on her own behalf.   6. Skin/Wound Care: Routine pressure relief measures.    7. FEN/AKI: encouraged oral hydration, continue vitamins/supplements, continue routine labs.    8. Screening for vitamin D  deficiency: D level 28.1 on 02/06/24, continue D3 2,000U daily   9. HTN: BP reviewed and is normal, amlodipine  d/ced due to leg swelling, d/c chorthalidone 25mg  daily, d/c avapro , continue Bystolic  10mg   daily.  Monitor BP with activity. Hydrochlorothiazide  6.25mg  daily added due to leg swelling, continue Vitals:   02/21/24 2331 02/22/24 0134 02/22/24 0331 02/22/24 0526  BP: (!) 125/57 (!) 132/57 (!) 123/59 129/76   02/22/24 0855 02/22/24 1106 02/22/24 1631 02/22/24 1918  BP: 131/70 126/69 134/77 131/71   02/23/24 0436 02/23/24 1300 02/23/24 2011 02/24/24 0838  BP: 124/66 136/67 129/79 137/70    10. DM2: A1c 7.2, Home metformin  held, d/c Farxiga . D/c ISS, CBGs reviewed and remain well controlled, will discontinue CBG checks and we can monitor fasting glucose with biweekly BMPs  CBG (last 3)  No results for input(s): GLUCAP in the last 72 hours.  11. HLD: continue  Lipitor 80 mg daily, discussed that repeat lipid panel shows improved LDL and total cholesterol and worse HDL   12. OSA: continue CPAP nightly    13. Left Thyroid Nodule: 2.2 cm thyroid nodule found on CT.  Follow up outpatient for non emergent thyroid US .    14. Class 4 Obesity:  BMI is 47.85 kg/m . Educate on diet and weight loss to promote overall health and mobility. Continue magnesium  supplement   15. Constipation- will order Miralax  daily that she's taking as well as Dulcolax suppository tonight to help her get back on schedule. - She also wants apple juice. Mag oxide added HS. Continue senokot s 2 tabs nightly. LBM 1/23- messaged nursing to confirm accuracy  16. Hypokalemia: klor ordered on 1/19, kdur ordered 1/20, 40 klor ordered 1/22, 40 klor ordered 1/26  17. Right hand swelling: US  reviewed and is negative for clot- discussed with patient  20. RLE swelling: Vas US  ordered- discussed that this was negative for clot, discussed that swelling has improved after discontinuation of amlodipine , BP reviewed and remains stable off amlodipine   19.  Left wrist soreness: Suspect mild sprain after fall.  No fractures noted on x-ray. Continue Voltaren  gel ordered   LOS: 19 days A FACE TO FACE EVALUATION WAS  PERFORMED  Sven SQUIBB Yancarlos Berthold 02/24/2024, 9:43 AM     "

## 2024-02-24 NOTE — Progress Notes (Signed)
 Occupational Therapy Session Note  Patient Details  Name: Marisa [name] MRN: [id number] Date of Birth: [birth year]  Today's Date: 02/24/2024 OT Individual Time: 1100-1115 OT Individual Time Calculation (min): 15 min  Concurrent treatment with PT: 1100-1130 Concurrent treatment time calculation (min): 30 min    Short Term Goals: Week 3:  OT Short Term Goal 1 (Week 3): Pt will keep RUE in safe place 100% of the time with min questioning cues for 2 consecutive sessions OT Short Term Goal 2 (Week 3): Pt will complete functional bed mobility with min A OT Short Term Goal 3 (Week 3): Pt will complete LB dressing with AE PRN with mod A  Skilled Therapeutic Interventions/Progress Updates:  Skilled OT intervention completed with focus on custom w/c eval with rep, Penne, from [employer] and PT present. Pt received seated in w/c in gym, agreeable to session. No pain reported.  OT assisted with donning BLE shoes and R AFO with total A in prep for evaluation of pt's seated positioning in current w/c and self-propulsion. Pt able to self-propel with supervision using L hemi body.   OT made the following recommendations for custom w/c to increase independence and accessibility: - a lightweight Helio A6 (manual) - R slim line half lap tray with padding for skin integrity - hemi height to allow for independence with WC propulsion - tension adjustable back support - swing away armrests and legrests for squat pivot transfers - avoidance of wheel spikes (current chair has this) to increase bathroom accessibility and maneuvering due to adequate dexterity in intact side  Pt remained seated in w/c with direct care hand off to PT at end of session.   Therapy Documentation Precautions:  Precautions Precautions: Fall Recall of Precautions/Restrictions: Intact Precaution/Restrictions Comments: R dense hemi Restrictions Weight Bearing Restrictions Per Provider Order: No    Therapy/Group:  Individual Therapy  [name], MS, OTR/L  02/24/2024, 12:01 PM

## 2024-02-25 NOTE — Progress Notes (Signed)
 Speech Language Pathology Daily Session Note  Patient Details  Name: Marisa [name] MRN: [id number] Date of Birth: March 03, 1960  Today's Date: 02/25/2024 SLP Individual Time: 1000-1100 SLP Individual Time Calculation (min): 60 min  Short Term Goals: Week 3: SLP Short Term Goal 1 (Week 3): STGs = LTGs d/t ELOS  Skilled Therapeutic Interventions: SLP conducted skilled therapy session targeting communication and cognitive goals. SLP verbally provided patient with 3 and 4-word sequences and challenged patient to provide words in various orders including order of occurrence and reverse order where she benefited from supervision for 3-word sequences and min assist for 4-word sequences. Patient then completed mildly complex deductive reasoning puzzle with min assist and mildly complex workindg memory/decoding task with supervision-min assist. Patient recalled events from earlier in the day with supervision-modI. Patient was left in room with call bell in reach and alarm set. SLP will continue to target goals per plan of care.        Pain  None endorsed  Therapy/Group: Individual Therapy  Jimi Schappert, M.A., CCC-SLP  Vallorie Niccoli A Klinton Candelas 02/25/2024, 11:40 AM

## 2024-02-25 NOTE — Progress Notes (Signed)
 Physical Therapy Session Note  Patient Details  Name: Marisa [name] MRN: [id number] Date of Birth: 05-27-1960  Today's Date: 02/25/2024 PT Individual Time: 0730-0840 PT Individual Time Calculation (min): 70 min   Short Term Goals: Week 2:  PT Short Term Goal 1 (Week 2): Pt will transfer supine<>sitting EOB with min A consistantly PT Short Term Goal 1 - Progress (Week 2): Met PT Short Term Goal 2 (Week 2): pt will transfer bed<>chair with LRAD and min A PT Short Term Goal 2 - Progress (Week 2): Met PT Short Term Goal 3 (Week 2): pt will ambulate 47ft with LRAD and max A of 1 PT Short Term Goal 3 - Progress (Week 2): Met Week 3:  PT Short Term Goal 1 (Week 3): STG=LTG due to LOS  Skilled Therapeutic Interventions/Progress Updates:   Received pt transferring to [facility] in [place] with NT - PT took over with care. RN arrived to administer medication and pt requested to wash up at sink. Sat in WC at sink and brushed teeth/washed upper body with setup assist. Pt agreeable to PT treatment and denied any pain during session. Session with emphasis on functional mobility/transfers, dressing, generalized strengthening and endurance dynamic standing balance/coordination, NMR, and family education. Donned pull over shirt with mod A to thread RLE through and donned socks, shoes, and R AFO with max A (noted improvements in BLE edema today).   Went through sensation and MMT, then pt transported to/from room in [facility] dependently for time management purposes. Pt requested to work on [employer] and performed stand<>pivot onto Nustep with min A. Pt required assist to get RLE on/off footplate and added RLE attachment to prevent excessive hip abduction and RUE attachment to secure R hand to handgrip. Worked on seated BUE/BLE strengthening on workload 5 increasing to 6 for 15 minutes with emphasis on cardiovascular endurance, RUE/RLE NMR, and reciprocal movement training. Total of 720 steps, 0.4 miles, 38 SPM, and 1.9  METs. Daughter Annabella arrived for caregiver education - assisted with stand<>pivot Nustep<>WC with min A - educated Tiffany on body mechanics, hand placement, and use of gait belt with transfer.   Pt reports her bed at home is extremely high and even if removing the boxspring, still too high - therefore will need hospital bed (CSW notified). Pt also asking if she can have SCD's at home - MD notified. Returned to room and practiced x 2 additional stand<>pivot transfers with Tiffany and min A (increased difficulty transferring to R). Educated pt/Tiffany on stopping, sitting back down, and resetting if transfer isn't going well and to ensure R foot is in correct place. Pt politely declined practicing any more transfers due to fatigue. Concluded session with pt sitting in [facility] with all needs within reach and Tiffany at bedside.   Therapy Documentation Precautions:  Precautions Precautions: Fall Recall of Precautions/Restrictions: Intact Precaution/Restrictions Comments: R dense hemi Restrictions Weight Bearing Restrictions Per Provider Order: No  Therapy/Group: Individual Therapy Therisa HERO Zaunegger Therisa Stains PT, DPT 02/25/2024, 6:53 AM

## 2024-02-25 NOTE — Progress Notes (Signed)
 Occupational Therapy Session Note  Patient Details  Name: Marisa [name] MRN: [id number] Date of Birth: Dec 20, 1960  Today's Date: 02/25/2024 OT Individual Time: 8694-8584 OT Individual Time Calculation (min): 70 min    Short Term Goals: Week 3:  OT Short Term Goal 1 (Week 3): Pt will keep RUE in safe place 100% of the time with min questioning cues for 2 consecutive sessions OT Short Term Goal 2 (Week 3): Pt will complete functional bed mobility with min A OT Short Term Goal 3 (Week 3): Pt will complete LB dressing with AE PRN with mod A  Skilled Therapeutic Interventions/Progress Updates:  Skilled OT intervention completed with focus on family education, ADL retraining. Pt received seated in w/c with husband present, agreeable to session. No pain reported.  Husband reported he was on his way out the door, but agreeable to stay to discuss DC plans for BADLs and functional mobility. OT provided education on the following in prep for DC: -Recommended squat/stand pivots with min/mod A for all mobility; pt unaware that the recommendation was not to ambulate at home- time spent discussing reasons why I.e. fall risk, R dense hemi, f/u therapies -Confirmed bathroom set up, with husband reporting bathroom doorway is 27.5 therefore w/c can fit through -reports that shower is a walk in shower with door and only other option is tub/shower with door. We discussed that long term accessibility would be ideal to have one of the sets of doors removed for TTB to be utilized to prevent falls and increase safety/independence as pt wont be ambulatory, however pt prefers to sponge bathe vs fall risk and remodeling -Pt wants a stedy- handout issued for one to buy that is reasonably priced and will fit through doorway - discussed that family should still learn how to assist her outside of the stedy in prep for public toileting etc but husband declined working on this due to medical condition but reports he  did transfer practice with PT... -Discussed DABSC recommendation for longevity of DME given CVA risk, and for squat pivot transfers -Hospital bed attempted to get through insurance but CSW notified of family request to speak about plan if denied  OT assisted pt in demonstrating the following during session: -Min A sit > stand in stedy, dependent transfer > toilet due to urgency, then mod A lowering of clothes. Pt continent of urinary void and BM. Required mod A for toileting steps in stedy. Transfer > w/c. Pt requested to use saebo e stim.  Educated about alternating the placement of it for NMR, not just on shoulder. Pt remained seated in w/c with CSW present, with chair alarm on/activated, and with all needs in reach at end of session.  Saebo Stim One  Applied for 60 mins (unattended) to the R wrist extensors for NMR with the following parameters:  330 pulse width 35 Hz pulse rate On 8 sec/ off 8 sec Ramp up/ down 2 sec Symmetrical Biphasic wave form  Max intensity at 500 Ohm load  Removed from pt at end of cycle with no adverse skin reaction or irritation noted.  Therapy Documentation Precautions:  Precautions Precautions: Fall Recall of Precautions/Restrictions: Intact Precaution/Restrictions Comments: R dense hemi Restrictions Weight Bearing Restrictions Per Provider Order: No    Therapy/Group: Individual Therapy  [name], MS, OTR/L  02/25/2024, 2:17 PM

## 2024-02-25 NOTE — Progress Notes (Signed)
 Patient ID: Marisa [name], female   DOB: [birth year], 64 y.o.   MRN: [id number]  Mr. Rosa confirmed that he can be present for family education 1-4 pm tomorrow and 8-12 on 1/30 (Friday).

## 2024-02-25 NOTE — Progress Notes (Addendum)
 Patient ID: Marisa [name], female   DOB: [birth year], 64 y.o.   MRN: [id number]  Submitted order for DME: Hospital bed and drop arm commode via [employer].  Patient/family aware and scheduling delivery for hospital bed

## 2024-02-26 ENCOUNTER — Inpatient Hospital Stay: Admitting: Internal Medicine

## 2024-02-26 ENCOUNTER — Ambulatory Visit (HOSPITAL_BASED_OUTPATIENT_CLINIC_OR_DEPARTMENT_OTHER): Admitting: Pharmacist Clinician (PhC)/ Clinical Pharmacy Specialist

## 2024-02-26 LAB — BASIC METABOLIC PANEL WITH GFR
Anion gap: 7 (ref 5–15)
BUN: 14 mg/dL (ref 8–23)
CO2: 29 mmol/L (ref 22–32)
Calcium: 8.9 mg/dL (ref 8.9–10.3)
Chloride: 105 mmol/L (ref 98–111)
Creatinine, Ser: 0.73 mg/dL (ref 0.44–1.00)
GFR, Estimated: >60 mL/min
Glucose, Bld: 108 mg/dL — ABNORMAL HIGH (ref 70–99)
Potassium: 3.4 mmol/L — ABNORMAL LOW (ref 3.5–5.1)
Sodium: 140 mmol/L (ref 135–145)

## 2024-02-26 LAB — CBC WITH DIFFERENTIAL/PLATELET
Abs Immature Granulocytes: 0.02 10*3/uL (ref 0.00–0.07)
Basophils Absolute: 0 10*3/uL (ref 0.0–0.1)
Basophils Relative: 0 %
Eosinophils Absolute: 0.1 10*3/uL (ref 0.0–0.5)
Eosinophils Relative: 2 %
HCT: 32.9 % — ABNORMAL LOW (ref 36.0–46.0)
Hemoglobin: 10.7 g/dL — ABNORMAL LOW (ref 12.0–15.0)
Immature Granulocytes: 0 %
Lymphocytes Relative: 39 %
Lymphs Abs: 2.3 10*3/uL (ref 0.7–4.0)
MCH: 22.5 pg — ABNORMAL LOW (ref 26.0–34.0)
MCHC: 32.5 g/dL (ref 30.0–36.0)
MCV: 69.1 fL — ABNORMAL LOW (ref 80.0–100.0)
Monocytes Absolute: 0.5 10*3/uL (ref 0.1–1.0)
Monocytes Relative: 8 %
Neutro Abs: 3 10*3/uL (ref 1.7–7.7)
Neutrophils Relative %: 51 %
Platelets: 217 10*3/uL (ref 150–400)
RBC: 4.76 MIL/uL (ref 3.87–5.11)
RDW: 17.1 % — ABNORMAL HIGH (ref 11.5–15.5)
Smear Review: NORMAL
WBC: 5.9 10*3/uL (ref 4.0–10.5)
nRBC: 0 % (ref 0.0–0.2)

## 2024-02-26 MED ORDER — POTASSIUM CHLORIDE CRYS ER 20 MEQ PO TBCR
20.0000 meq | EXTENDED_RELEASE_TABLET | Freq: Every day | ORAL | Status: DC
  Administered 2024-02-26 – 2024-03-02 (×6): 20 meq via ORAL
  Filled 2024-02-26 (×6): qty 1

## 2024-02-26 MED ORDER — MAGNESIUM OXIDE -MG SUPPLEMENT 400 (240 MG) MG PO TABS
200.0000 mg | ORAL_TABLET | Freq: Every day | ORAL | Status: DC
  Administered 2024-02-26 – 2024-03-01 (×5): 200 mg via ORAL
  Filled 2024-02-26 (×5): qty 1

## 2024-02-26 MED ORDER — NEBIVOLOL HCL 5 MG PO TABS
5.0000 mg | ORAL_TABLET | Freq: Every day | ORAL | Status: DC
  Administered 2024-02-27 – 2024-03-02 (×5): 5 mg via ORAL
  Filled 2024-02-26 (×5): qty 1

## 2024-02-26 NOTE — Progress Notes (Signed)
 Physical Therapy Session Note  Patient Details  Name: Marisa [name] MRN: [id number] Date of Birth: [birth year]  Today's Date: 02/26/2024 PT Individual Time: 1350-1458 PT Individual Time Calculation (min): 68 min   Short Term Goals: Week 2:  PT Short Term Goal 1 (Week 2): Pt will transfer supine<>sitting EOB with min A consistantly PT Short Term Goal 1 - Progress (Week 2): Met PT Short Term Goal 2 (Week 2): pt will transfer bed<>chair with LRAD and min A PT Short Term Goal 2 - Progress (Week 2): Met PT Short Term Goal 3 (Week 2): pt will ambulate 63ft with LRAD and max A of 1 PT Short Term Goal 3 - Progress (Week 2): Met Week 3:  PT Short Term Goal 1 (Week 3): STG=LTG due to LOS  Skilled Therapeutic Interventions/Progress Updates:   Received pt sitting in [facility] with husband, Annabella, [place], and grandaughter present for continued family education training - handoff from OT. Pt agreeable to PT treatment and denied any pain during session. Session with emphasis on discharge planning, functional mobility/transfers, generalized strengthening and endurance, dynamic standing balance/coordination, and simulated car transfers. Pt transported to/from room in [facility] dependently for time management purposes. Pt performed simulated car transfers x 2 trials with min/mod A provided by [place] and granddaughter. Pt required min A for stand<>pivot, but mod A to get RLE in/out of car. Paris demonstrated confidence with WC parts management (donning/doffing legrests and lap tray). Of note, pt's brakes on new loaner WC loose - reached out to [place] from [employer] to tighten.   In rehab apartment, practiced furniture transfers on/off low sitting couch and recliner with [place] with min A. Pt required increased assist to transition off rocking recliner but reports recliner at home doesn't rock. Pt performed seated BLE strengthening on Kinetron at 30 cm/sec for 3 minutes with emphasis on glute/quad strength while therapist  assisted pt's husband with ordering briefs and discussed where to order chuck pads.   Pt reported fatigue and declined working on ambulation or more transfers but agreed to participate in seated dance group. Pt participated for ~5 minutes, then reported urge to toilet - returned to room and daughter Lynnea assisted with Stedy transfer to/from commode dependently. Pt/Paris able to manage clothing and perform all toileting tasks without assist. Discussed how pt's Stedy doesn't have adjustable legs, therefore will need to stagger Stedy in between front casters if base isn't narrow enough to fit under pt's WC.  Educated/discussed the following with pt/family throughout session: -appropriate use of gait belt -hand placement, body mechanics, and positioning of WC for transfers -importance of remaining on pt's R side when possible -showing family how to bump pt up/down 1 step/curb in tomorrow's family education session  Concluded session with pt sitting in [facility] with all needs within reach and family at bedside.   Therapy Documentation Precautions:  Precautions Precautions: Fall Recall of Precautions/Restrictions: Intact Precaution/Restrictions Comments: R dense hemi Restrictions Weight Bearing Restrictions Per Provider Order: No  Therapy/Group: Individual Therapy Therisa HERO Zaunegger Therisa Stains PT, DPT 02/26/2024, 6:55 AM

## 2024-02-26 NOTE — Progress Notes (Signed)
 Occupational Therapy Session Note  Patient Details  Name: Marisa [name] MRN: [id number] Date of Birth: 1961/01/13  Today's Date: 02/26/2024 OT Individual Time: 1305-1350 ( session 2)  OT Individual Time Calculation (min): 45 min   OT Individual Time: 0950-1103( session 1)  OT Individual Time Calculation (min): 73 min  Short Term Goals: Week 3:  OT Short Term Goal 1 (Week 3): Pt will keep RUE in safe place 100% of the time with min questioning cues for 2 consecutive sessions OT Short Term Goal 2 (Week 3): Pt will complete functional bed mobility with min A OT Short Term Goal 3 (Week 3): Pt will complete LB dressing with AE PRN with mod A  Skilled Therapeutic Interventions/Progress Updates:  Session 1: Pt greeted supine in bed, pt agreeable to OT intervention.    No pain reported during session.   Transfers/bed mobility/functional mobility:  Nurse assisted with bed mobility while therapist retrieved teds. Pt completed sit>stand to stedy with supervision. Dependent transfers in stedy,pt reports family purchased stedy.     ADLs:  Grooming: pt completed seated oral care at sink with supervision using hemi techniques as needed.   UB dressing: pt donned Overhead shirt with MIN A to thread RUE first  LB dressing: pt donned brief and pants with MAX A needing assist to thread RLE and pull pants to waist line in standing in stedy, pt reports she plans to complete LB dressing from stedy at home  Footwear: donned teds/shoes with total A, education provided on using friction reducing device to don teds  Bathing: pt completed bathing from shower level with MODA to wash L hemibody d/t RUE hemiparesis, pt used LH sponge as needed but did need more of MOD A for cleanliness  Transfers: dependent transfers in stedy, pt stands to stedy with supervision.   Toileting: continent urine void. Pt completed 3/3 toileting tasks with MODA for clothing mgmt.                  Ended session with pt  seated in w/c with all needs within reach.     Session 2: Pt greeted seated in w/c, pt agreeable to OT intervention.    Multiple family members present for family ed. No pain reported during session.  family education provided to husband, 2 daughters and granddaughter on the below topics: -always using gait belt for functional mobility -family reports ordering a stedy but it wont be delivered until 2/4. Recommended stand pivot transfers as primary transfer means. -husband, 2 daughters and granddaughter all demonstrated ability to assist pt with stand pivot transfer from w/c<>EOB to R and L side.  - discussed plan for ADLS, I.e pt prefers bathing from bed, dressing from w/c of EOB.  -demo'ed standing from w/c to bed rail on L side to assist with LB ADLs  -general education provided on RUE positioning and closed chain therex that can be completed on lap tray, recommended daily stretching and encouraging active ROM  - discussed DME needs, plan to put [facility] over hall bathroom with plan to stand pivot to [facility] from w/c level, pt also getting a hospital bed and already has custom chair - educated pt and family on w/c parts I.e brakes, leg rests/armrests -demo'ed how to don R AFO and recommended use of frictions reducing device to don teds at home.   - dicussed differences in [facility] vs OP, recommended pt attend OP Neuro if possible.  Direct hand off to PT for next session.               Therapy Documentation Precautions:  Precautions Precautions: Fall Recall of Precautions/Restrictions: Intact Precaution/Restrictions Comments: R dense hemi Restrictions Weight Bearing Restrictions Per Provider Order: No    Therapy/Group: Individual Therapy  [name] 02/26/2024, 3:14 PM

## 2024-02-26 NOTE — Progress Notes (Signed)
 "                                                        PROGRESS NOTE   Subjective/Complaints: No new complaints this morning She states that hospital bed was covered She is happy with extension of d/c to Monday VSS  ROS: +right sided hemiplegia- continues See HPI,  denies fevers/chills, CP, SOB, abd pain, n/v/d/c fatigue has decreased, +mild constipation- continues +L wrist soreness   Objective:   No results found.    Recent Labs    02/26/24 0540  WBC 5.9  HGB 10.7*  HCT 32.9*  PLT 217     Recent Labs    02/24/24 1028 02/26/24 0540  NA 140 140  K 3.9 3.4*  CL 103 105  CO2 27 29  GLUCOSE 140* 108*  BUN 14 14  CREATININE 0.78 0.73  CALCIUM  9.1 8.9    Intake/Output Summary (Last 24 hours) at 02/26/2024 1107 Last data filed at 02/26/2024 0926 Gross per 24 hour  Intake 480 ml  Output 280 ml  Net 200 ml        Physical Exam: Vital Signs Blood pressure 128/62, pulse (!) 54, temperature 98.1 F (36.7 C), resp. rate 18, height 5' (1.524 m), weight 105.7 kg, SpO2 97%.  Gen: no distress, sitting upright in bed, comfortable HEENT: oral mucosa pink and moist, NCAT Cardio: bradycardic, reg rhythm, no m/r/g appreciated Chest: normal effort, normal rate of breathing, CTAB Abd: soft, non-distended, nontender, +BS throughout Ext: BLE nonpitting edema, improved in RLE Psych: pleasant, normal affect, interactive Skin: intact to exposed surfaces Neuro: Alert and oriented x3, 5/5 strength on left side, 0/5 strength throughout right UE, right lower extremity with 3/5 KE and is otherwise 0/5, sensation is intact, stable 1/29 MSK: No significant wrist tenderness, minimal pain with wrist ROM.  She points to mild area of soreness left lateral wrist.   Assessment/Plan: 1. Functional deficits which require 3+ hours per day of interdisciplinary therapy in a comprehensive inpatient rehab setting. Physiatrist is providing close team supervision and 24 hour management of  active medical problems listed below. Physiatrist and rehab team continue to assess barriers to discharge/monitor patient progress toward functional and medical goals  Care Tool:  Bathing    Body parts bathed by patient: Right arm, Chest, Abdomen, Front perineal area, Buttocks, Right upper leg, Left upper leg, Face   Body parts bathed by helper: Left arm, Right lower leg, Right upper leg     Bathing assist Assist Level: Minimal Assistance - Patient > 75%     Upper Body Dressing/Undressing Upper body dressing   What is the patient wearing?: Pull over shirt    Upper body assist Assist Level: Minimal Assistance - Patient > 75%    Lower Body Dressing/Undressing Lower body dressing      What is the patient wearing?: Pants, Underwear/pull up     Lower body assist Assist for lower body dressing: Maximal Assistance - Patient 25 - 49%     Toileting Toileting    Toileting assist Assist for toileting: Moderate Assistance - Patient 50 - 74%     Transfers Chair/bed transfer  Transfers assist     Chair/bed transfer assist level: Minimal Assistance - Patient > 75%     Locomotion Ambulation   Ambulation assist  Assist level: 2 helpers Assistive device: Other (comment) (3-muskateers) Max distance: 60'   Walk 10 feet activity   Assist     Assist level: 2 helpers Assistive device: Other (comment) (3-muskateers)   Walk 50 feet activity   Assist Walk 50 feet with 2 turns activity did not occur: Safety/medical concerns (weakness, fatigue)  Assist level: 2 helpers Assistive device: Other (comment) (3-muskateers)    Walk 150 feet activity   Assist Walk 150 feet activity did not occur: Safety/medical concerns         Walk 10 feet on uneven surface  activity   Assist Walk 10 feet on uneven surfaces activity did not occur: Safety/medical concerns         Wheelchair     Assist Is the patient using a wheelchair?: Yes Type of Wheelchair:  Manual    Wheelchair assist level: Supervision/Verbal cueing Max wheelchair distance: 122ft    Wheelchair 50 feet with 2 turns activity    Assist        Assist Level: Supervision/Verbal cueing   Wheelchair 150 feet activity     Assist      Assist Level: Dependent - Patient 0%   Blood pressure 128/62, pulse (!) 54, temperature 98.1 F (36.7 C), resp. rate 18, height 5' (1.524 m), weight 105.7 kg, SpO2 97%.  Medical Problem List and Plan: 1. 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 Continue Asa and Plavix  with statin. Follow up with Dr. Chalice in 4 weeks.              -patient may  shower -ELOS/Goals: 14-18 days due to R hemiplegia- Min A hopefully, goals, mod I to supervision for SLP/dysarthria Chart and therapy notes reviewed, continue CIR, discussed patient's progress with therapy Grounds pass ordered Vitamin D3/Metanx/Vitamin B/C complex ordered F/u with Dr. JONELLE or Fidela in clinic within 1 month Would benefit from home health aide upon discharge  Continue CIR, d/c goal 1/29 Estim ordered for disuse atrophy and non-narcotic treatment of pain (left wrist) Discussed with patient that she was told that hospital bed would be covered for her   2.  Antithrombotics: continue Lovenox  40mg  daily; antiplatelet ASA and plavix  daily   3. Pain Management: Tylenol  prn    4. Mood/Behavior/Sleep: LCSW to follow for evaluation and support when available.              -antipsychotic agents: N/A  -trazodone  PRN   5. Neuropsych/cognition: This patient is capable of making decisions on her own behalf.   6. Skin/Wound Care: Routine pressure relief measures.    7. FEN/AKI: encouraged oral hydration, continue vitamins/supplements, continue routine labs.    8. Screening for vitamin D  deficiency: D level 28.1 on 02/06/24, continue D3 2,000U daily   9. HTN: BP reviewed and is normal, amlodipine  d/ced due to leg swelling,  d/c chorthalidone 25mg  daily, d/c avapro ,  Monitor BP with activity. Hydrochlorothiazide  6.25mg  daily added due to leg swelling, continue Vitals:   02/22/24 1106 02/22/24 1631 02/22/24 1918 02/23/24 0436  BP: 126/69 134/77 131/71 124/66   02/23/24 1300 02/23/24 2011 02/24/24 0838 02/24/24 1932  BP: 136/67 129/79 137/70 (!) 141/63   02/25/24 0405 02/25/24 1644 02/25/24 2002 02/26/24 0510  BP: (!) 145/73 (!) 144/72 139/71 128/62    10. DM2: A1c 7.2, Home metformin  held, d/c Farxiga . D/c ISS, CBGs reviewed and remain well controlled, will discontinue CBG checks and we can monitor fasting glucose with biweekly BMPs  CBG (  last 3)  No results for input(s): GLUCAP in the last 72 hours.    11. HLD: continue  Lipitor 80 mg daily, discussed that repeat lipid panel shows improved LDL and total cholesterol and worse HDL   12. OSA: continue CPAP nightly    13. Left Thyroid Nodule: 2.2 cm thyroid nodule found on CT.  Follow up outpatient for non emergent thyroid US .    14. Class 4 Obesity:  BMI is 47.85 kg/m . Educate on diet and weight loss to promote overall health and mobility. Continue magnesium  supplement   15. Constipation- will order Miralax  daily that she's taking as well as Dulcolax suppository tonight to help her get back on schedule. - She also wants apple juice. Mag oxide added HS. Continue senokot s 2 tabs nightly. LBM 1/23- messaged nursing to confirm accuracy  16. Hypokalemia: klor ordered on 1/19, kdur ordered 1/20, 40 klor ordered 1/22, 40 klor ordered 1/26, 20mg  daily supplement started 1/29  17. Right hand swelling: US  reviewed and is negative for clot- discussed with patient  53. RLE swelling: Vas US  ordered- discussed that this was negative for clot, discussed that swelling has improved after discontinuation of amlodipine , BP reviewed and remains stable off amlodipine   19.  Left wrist soreness: Suspect mild sprain after fall.  No fractures noted on x-ray. Continue  Voltaren  gel ordered  20. Bradycardia: decrease Bystolic  to 5mg  daily   LOS: 21 days A FACE TO FACE EVALUATION WAS PERFORMED  Chika Cichowski P Delorese Sellin 02/26/2024, 11:07 AM     "

## 2024-02-26 NOTE — Progress Notes (Unsigned)
 "  Office Visit    Patient Name: Marisa [name] Date of Encounter: 02/26/2024  Primary Care Provider:  Jarold Medici, MD Primary Cardiologist:  None  Chief Complaint   Hypertension  Significant Past Medical History   DM2 8/25 A1c 6.6  - on empagliflozin, metformin , Ozempic   HLD 4/25 LDL 114 - on atorvastatin   OSA Managed with CPAP    No Known Allergies  History of Present Illness    Marisa [name] is a 64 y.o. female patient of Dr. Jarold, in the office today for hypertension evaluation.   I last saw her a month ago, at which time her BP was 182/96 in the office.  She was on below listed medications and not interested in any changes.  We discussed measuring home blood pressure on forearm, as cuff too small for upper arm.   Since then she was seen by Medici Jarold MD, and had an office BP reading of 132/80.    Patient returns today for follow up.  She recently received a letter telling her that the Tribenzor would no longer be covered, Dr. Jarold wrote prescriptions for each of the three medications individually.  She has lost about 8 pounds recently and this is making her feel much better and more motivated to continue.    Last visit in November, switched hctz to chlorthalidone   Blood Pressure Goal:  130/80  Current Medications:  Nebivolol  10 mg daily, Olmesartan  40 mg daily, amlodipine  10 mg daily, chlorthalidone  25 mg daily  Family Hx:   Mother has history of HTN, DM, deceased with stage 4 kidney (possible heart attack) 3 children: age 44, 57, and 70 (all healthy)  Social Hx:      Tobacco: none  Alcohol: none  Caffeine: stopped drinking coffee  Diet: now eating breakfast - often overnight oats and adding flax; more nuts and seeds to increase protein  Exercise: water aerobic and strength training here a [employer] - is trying to get walking in daily  Home BP readings: still elevated at home, but have improved since last visit  Accessory Clinical Findings     Lab Results  Component Value Date   CREATININE 0.73 02/26/2024   BUN 14 02/26/2024   NA 140 02/26/2024   K 3.4 (L) 02/26/2024   CL 105 02/26/2024   CO2 29 02/26/2024   Lab Results  Component Value Date   ALT 9 02/06/2024   AST 17 02/06/2024   ALKPHOS 85 02/06/2024   BILITOT 0.6 02/06/2024   Lab Results  Component Value Date   HGBA1C 7.2 (H) 12/30/2023    Home Medications    No current facility-administered medications for this visit.   No current outpatient medications on file.   Facility-Administered Medications Ordered in Other Visits  Medication Dose Route Frequency Provider Last Rate Last Admin   acetaminophen  (TYLENOL ) tablet 325-650 mg  325-650 mg Oral Q4H PRN Jerilynn Daphne SAILOR, NP   650 mg at 02/23/24 0745   alum & mag hydroxide-simeth (MAALOX/MYLANTA) 200-200-20 MG/5ML suspension 30 mL  30 mL Oral Q4H PRN Lawrence, Brandi N, NP       ascorbic acid  (VITAMIN C ) tablet 1,000 mg  1,000 mg Oral Daily Jerilynn Daphne SAILOR, NP   1,000 mg at 02/25/24 9265   atorvastatin  (LIPITOR) tablet 80 mg  80 mg Oral Once per day on Monday Tuesday Wednesday Thursday Friday Saturday Jerilynn Daphne SAILOR, NP   80 mg at 02/25/24 1220   B-complex with vitamin C  tablet 1 tablet  1 tablet Oral Daily Raulkar, Krutika P, MD   1 tablet at 02/25/24 0734   bisacodyl  (DULCOLAX) suppository 10 mg  10 mg Rectal Daily PRN Lawrence, Brandi N, NP   10 mg at 02/05/24 1812   cholecalciferol  (VITAMIN D3) 25 MCG (1000 UNIT) tablet 3,000 Units  3,000 Units Oral Daily Raulkar, Krutika P, MD   3,000 Units at 02/25/24 0734   clopidogrel  (PLAVIX ) tablet 75 mg  75 mg Oral Daily Jerilynn Daphne SAILOR, NP   75 mg at 02/25/24 9265   diclofenac  Sodium (VOLTAREN ) 1 % topical gel 2 g  2 g Topical QID Urbano Albright, MD   2 g at 02/25/24 2202   diphenhydrAMINE  (BENADRYL ) capsule 25 mg  25 mg Oral Q6H PRN Jerilynn Daphne SAILOR, NP       enoxaparin  (LOVENOX ) injection 50 mg  50 mg Subcutaneous Q24H Raulkar, Krutika P, MD   50 mg  at 02/25/24 1220   guaiFENesin -dextromethorphan (ROBITUSSIN DM) 100-10 MG/5ML syrup 5-10 mL  5-10 mL Oral Q6H PRN Jerilynn Daphne SAILOR, NP       hydrochlorothiazide  (HYDRODIURIL ) tablet 6.25 mg  6.25 mg Oral Daily Raulkar, Sven SQUIBB, MD   6.25 mg at 02/25/24 0734   l-methylfolate-B6-B12 (METANX) 3-35-2 MG per tablet 1 tablet  1 tablet Oral Daily Raulkar, Sven SQUIBB, MD   1 tablet at 02/25/24 0734   magnesium  citrate solution 1 Bottle  1 Bottle Oral Once Raulkar, Krutika P, MD       nebivolol  (BYSTOLIC ) tablet 10 mg  10 mg Oral Daily Jerilynn Daphne SAILOR, NP   10 mg at 02/25/24 9265   Oral care mouth rinse  15 mL Mouth Rinse PRN Jerilynn Daphne SAILOR, NP       prochlorperazine  (COMPAZINE ) tablet 5-10 mg  5-10 mg Oral Q6H PRN Jerilynn Daphne SAILOR, NP       Or   prochlorperazine  (COMPAZINE ) suppository 12.5 mg  12.5 mg Rectal Q6H PRN Jerilynn Daphne SAILOR, NP       Or   prochlorperazine  (COMPAZINE ) injection 5-10 mg  5-10 mg Intravenous Q6H PRN Jerilynn Daphne SAILOR, NP       senna-docusate (Senokot-S) tablet 2 tablet  2 tablet Oral QHS Lawrence, Brandi N, NP   2 tablet at 02/21/24 2051   traZODone  (DESYREL ) tablet 25-50 mg  25-50 mg Oral QHS PRN Jerilynn Daphne SAILOR, NP             Assessment & Plan    Assessment: BP is uncontrolled in office BP 140/90 mmHg;  above the goal (<130/80). Her plan ([employer]) will no longer cover Tribenzor,  PCP divided out and sent month supply of all 3 meds at last visit Tolerates olmesartan , amlodipine , hctz and nebivolol  well, without any side effects Denies SOB, palpitation, chest pain, headaches,or swelling Reiterated the importance of regular exercise and low salt diet   Plan:  Stop taking hydrochlorothiazide  Start taking chlorthalidone  25 mg once daily Continue taking olmesartan , nebivolol  and amlodipine  Patient to keep record of BP readings with heart rate and report to us  at the next visit Patient to follow up with me in 2 months  Labs ordered today:  BMET in 2  weeks  Allean Mink PharmD [facility]    "

## 2024-02-26 NOTE — Progress Notes (Signed)
 Speech Language Pathology Daily Session Note  Patient Details  Name: Marisa [name] MRN: [id number] Date of Birth: 06/02/1960  Today's Date: 02/26/2024 SLP Individual Time: 1500-1530 SLP Individual Time Calculation (min): 30 min  Short Term Goals: Week 3: SLP Short Term Goal 1 (Week 3): STGs = LTGs d/t ELOS  Skilled Therapeutic Interventions:   Pt and her family greeted at bedside for tx targeting cognition and communication. SLP facilitated IOPI targeting labial and lingual strength. Labial: 10 reps @ 12 kpa and anterior lingual placement: 15 reps @ 55 kpa. She was able able to recall reason for IOPI and subsequent positive impact on speech production independently. She then completed a verbal task targeting thought formulation and generalization of speech intelligibility strategies. She benefited from minA for thought formulation and maintained 100% intelligibility @ modI. SLP provided education to the pt and her daughter re remaining mild cognitive-linguistic deficits, stroke recovery, and POC. Additional questions were answered. She was left in her WC w/ the alarm set and call light within reach. Recommend cont ST per POC.   Pain  None reported  Therapy/Group: Individual Therapy  Recardo DELENA Mole 02/26/2024, 3:52 PM

## 2024-02-26 NOTE — Plan of Care (Signed)
" °  Problem: Consults Goal: RH STROKE PATIENT EDUCATION Description: See Patient Education module for education specifics  Outcome: Progressing Goal: Nutrition Consult-if indicated Outcome: Progressing Goal: Diabetes Guidelines if Diabetic/Glucose > 140 Description: If diabetic or lab glucose is > 140 mg/dl - Initiate Diabetes/Hyperglycemia Guidelines & Document Interventions  Outcome: Progressing   Problem: RH BOWEL ELIMINATION Goal: RH STG MANAGE BOWEL WITH ASSISTANCE Description: STG Manage Bowel with mod I Assistance. Outcome: Progressing Goal: RH STG MANAGE BOWEL W/MEDICATION W/ASSISTANCE Description: STG Manage Bowel with Medication with mod I Assistance. Outcome: Progressing   Problem: RH BLADDER ELIMINATION Goal: RH STG MANAGE BLADDER WITH ASSISTANCE Description: STG Manage Bladder With toileting Assistance Outcome: Progressing   Problem: RH SAFETY Goal: RH STG ADHERE TO SAFETY PRECAUTIONS W/ASSISTANCE/DEVICE Description: STG Adhere to Safety Precautions With cues Assistance/Device. Outcome: Progressing   Problem: RH KNOWLEDGE DEFICIT Goal: RH STG INCREASE KNOWLEDGE OF DIABETES Description: Patient and spouse will be able to manage DM using educational resources for medications and dietary modification independently Outcome: Progressing Goal: RH STG INCREASE KNOWLEDGE OF HYPERTENSION Description: Patient and spouse will be able to manage HTN using educational resources for medications and dietary modification independently Outcome: Progressing Goal: RH STG INCREASE KNOWLEGDE OF HYPERLIPIDEMIA Description: Patient and spouse will be able to manage HLD using educational resources for medications and dietary modification independently Outcome: Progressing Goal: RH STG INCREASE KNOWLEDGE OF STROKE PROPHYLAXIS Description: Patient and spouse will be able to manage secondary risks using educational resources for medications and dietary modification independently Outcome:  Progressing   "

## 2024-02-26 NOTE — Plan of Care (Signed)
 Patient had no acute events overnight.   Problem: Consults Goal: RH STROKE PATIENT EDUCATION Description: See Patient Education module for education specifics  Outcome: Progressing Goal: Nutrition Consult-if indicated Outcome: Progressing Goal: Diabetes Guidelines if Diabetic/Glucose > 140 Description: If diabetic or lab glucose is > 140 mg/dl - Initiate Diabetes/Hyperglycemia Guidelines & Document Interventions  Outcome: Progressing   Problem: RH BOWEL ELIMINATION Goal: RH STG MANAGE BOWEL WITH ASSISTANCE Description: STG Manage Bowel with mod I Assistance. Outcome: Progressing Goal: RH STG MANAGE BOWEL W/MEDICATION W/ASSISTANCE Description: STG Manage Bowel with Medication with mod I Assistance. Outcome: Progressing   Problem: RH BLADDER ELIMINATION Goal: RH STG MANAGE BLADDER WITH ASSISTANCE Description: STG Manage Bladder With toileting Assistance Outcome: Progressing   Problem: RH SAFETY Goal: RH STG ADHERE TO SAFETY PRECAUTIONS W/ASSISTANCE/DEVICE Description: STG Adhere to Safety Precautions With cues Assistance/Device. Outcome: Progressing   Problem: RH KNOWLEDGE DEFICIT Goal: RH STG INCREASE KNOWLEDGE OF DIABETES Description: Patient and spouse will be able to manage DM using educational resources for medications and dietary modification independently Outcome: Progressing Goal: RH STG INCREASE KNOWLEDGE OF HYPERTENSION Description: Patient and spouse will be able to manage HTN using educational resources for medications and dietary modification independently Outcome: Progressing Goal: RH STG INCREASE KNOWLEGDE OF HYPERLIPIDEMIA Description: Patient and spouse will be able to manage HLD using educational resources for medications and dietary modification independently Outcome: Progressing Goal: RH STG INCREASE KNOWLEDGE OF STROKE PROPHYLAXIS Description: Patient and spouse will be able to manage secondary risks using educational resources for medications and dietary  modification independently Outcome: Progressing

## 2024-02-26 NOTE — Patient Care Conference (Cosign Needed)
 Inpatient RehabilitationTeam Conference and Plan of Care Update Date: 02/25/2024   Time: 11:36 AM    Patient Name: Marisa [name]      Medical Record Number: [id number]  Date of Birth: Sep 07, 1960 Sex: Female         Room/Bed: 4W20C/4W20C-01 Payor Info: Payor: [employer] / Plan: [employer] / Product Type: *No Product type* /    Admit Date/Time:  02/05/2024  3:46 PM  Primary Diagnosis:  Left basal ganglia embolic stroke [facility])  Hospital Problems: Principal Problem:   Left basal ganglia embolic stroke [facility]) Active Problems:   CVA (cerebral vascular accident) [facility])    Expected Discharge Date: Expected Discharge Date: 03/01/24  Team Members Present: Physician leading conference: Dr. Sven Elks Social Worker Present: Waverly Gentry, LCSW-A Nurse Present: Barnie Ronde, RN PT Present: Therisa Stains, PT OT Present: Lorrayne Fritter, OT SLP Present: Rosina Downy, SLP PPS Coordinator present : Eleanor Colon, SLP     Current Status/Progress Goal Weekly Team Focus  Bowel/Bladder   Patient is continent but can be incontinent at times.Last BM 1/27.   Maintain continence.   Assist with toileting needs.    Swallow/Nutrition/ Hydration   regluar/thin   modI  at goal level    ADL's   Supervision UB, Mod A LB and toileting   Min A ADLs, CGA transfers   RUE NMR, fam ed, DC planning, dynamic standing balance    Mobility   bed mobility min A, squat<>pivots and sit<>stands min A, gait 75ft with hemi walker and R AFO min A +2, 4 6in steps with 1 handrail max A +2, WC mobility 138ft supervision   supervision, CGA gait, min A steps  barriers: family education, R hemiparesis, decreased balance/coordination, motor planning/sequencing    Communication   trace to mild dysarthria   supervision to modI   use of speech intelligibility strategies at the conversation level, approaching modI    Safety/Cognition/ Behavioral Observations  trace to mild deficits, goals upgraded   supervision-min  assist   mildly complex problem solving, recall of daily events, awareness of errors, near goal level    Pain   Denies pain.   Pain of 0.   Assess pain q shift and prn.    Skin   Skin is clean, dry and intact.   No skin breakdown.  Assess skin q shift and prn.      Discharge Planning:  Will discharge home with spouse, and children providing 24/7 care and support. Therapy recommending OP PT/OT. Wheelchair evaluation completed. Have ordered hospital bed and 3-1 commode.   Team Discussion: Patient admitted post left basal ganglia CVA with dense right hemiparesis and cognitive deficits with poor endurance. New onset of facial numbness and increased fatigue with edema right lower extremity. Progress limited by mild complex problem solving difficulties and memory issues along with right hemiparesis and decreased motor planning.  Patient on target to meet rehab goals: yes, currently needs supervision for upper body care and mod assist for lower body care and toileting.  Needs min assist for squat pivot transfers. Able to ambulate up to 35' using a hemi-walker with min assist +2. Able to manage wheelchair up to 150' with supervision and navigate 4 steps with max assist.  Following speech strategies and working on neuromuscular re-education.  *See Care Plan and progress notes for long and short-term goals.   Revisions to Treatment Plan:  CT scan and shoulder xray; pain   Teaching Needs: Safety, medications, dietary modification, transfers, toileting, etc.   Current  Barriers to Discharge: Decreased caregiver support and Home enviroment access/layout  Possible Resolutions to Barriers: Family education Ramp for entry to home DME: custom wheelchair, hospital bed and drop arm BSC OP follow up services     Medical Summary               I attest that I was present, lead the team conference, and concur with the assessment and plan of the team.   [name] 02/26/2024,  9:34 AM

## 2024-02-27 NOTE — Plan of Care (Signed)
" °  Problem: Consults Goal: RH STROKE PATIENT EDUCATION Description: See Patient Education module for education specifics  Outcome: Progressing Goal: Nutrition Consult-if indicated Outcome: Progressing Goal: Diabetes Guidelines if Diabetic/Glucose > 140 Description: If diabetic or lab glucose is > 140 mg/dl - Initiate Diabetes/Hyperglycemia Guidelines & Document Interventions  Outcome: Progressing   Problem: RH BOWEL ELIMINATION Goal: RH STG MANAGE BOWEL WITH ASSISTANCE Description: STG Manage Bowel with mod I Assistance. Outcome: Progressing Goal: RH STG MANAGE BOWEL W/MEDICATION W/ASSISTANCE Description: STG Manage Bowel with Medication with mod I Assistance. Outcome: Progressing   Problem: RH BLADDER ELIMINATION Goal: RH STG MANAGE BLADDER WITH ASSISTANCE Description: STG Manage Bladder With toileting Assistance Outcome: Progressing   Problem: RH SAFETY Goal: RH STG ADHERE TO SAFETY PRECAUTIONS W/ASSISTANCE/DEVICE Description: STG Adhere to Safety Precautions With cues Assistance/Device. Outcome: Progressing   Problem: RH KNOWLEDGE DEFICIT Goal: RH STG INCREASE KNOWLEDGE OF DIABETES Description: Patient and spouse will be able to manage DM using educational resources for medications and dietary modification independently Outcome: Progressing Goal: RH STG INCREASE KNOWLEDGE OF HYPERTENSION Description: Patient and spouse will be able to manage HTN using educational resources for medications and dietary modification independently Outcome: Progressing Goal: RH STG INCREASE KNOWLEGDE OF HYPERLIPIDEMIA Description: Patient and spouse will be able to manage HLD using educational resources for medications and dietary modification independently Outcome: Progressing Goal: RH STG INCREASE KNOWLEDGE OF STROKE PROPHYLAXIS Description: Patient and spouse will be able to manage secondary risks using educational resources for medications and dietary modification independently Outcome:  Progressing   "

## 2024-02-27 NOTE — Progress Notes (Signed)
 Speech Language Pathology Daily Session Note  Patient Details  Name: Marisa [name] MRN: [id number] Date of Birth: [birth year]  Today's Date: 02/27/2024 SLP Individual Time: 1130-1200 SLP Individual Time Calculation (min): 30 min  Short Term Goals: Week 3: SLP Short Term Goal 1 (Week 3): STGs = LTGs d/t ELOS  Skilled Therapeutic Interventions:   Pt and her family greeted at bedside for tx targeting communication and cognition. Upon SLP arrival, she was able to recall education provided in prev ST tx sessions for her son. SLP assisted to provide some additional education after this as well. She then completed the remainder of the verbal task initiated in prev tx session that targeted thought formulation and generalization of speech intelligibility strategies. She benefited from s cues for thought formulation/abstract thinking and maintained 100% intelligibility @ modI. Of note, thought formulation was improved from prev tx session. SLP facilitated an additional complex thought formulation task requiring object descriptions. She benefited from only supervisionA throughout. She also independently completed 20 reps of IOPI @ 40 kpa in the anterior position (lingual) and 15 reps of IOPI @ 12 kpa in the labial position. At the end of tx tasks, she was left in her [facility] w/ her son present and call light within reach. Recommend cont ST per POC.   Pain  None reported  Therapy/Group: Individual Therapy  Marisa [name] 02/27/2024, 2:06 PM

## 2024-02-27 NOTE — Progress Notes (Signed)
 Occupational Therapy Session Note  Patient Details  Name: Marisa [name] MRN: [id number] Date of Birth: [birth year]  Today's Date: 02/27/2024 OT Individual Time: 9194-9084 & 1350-1430 OT Individual Time Calculation (min): 70 min & 40 min   Short Term Goals: Week 3:  OT Short Term Goal 1 (Week 3): Pt will keep RUE in safe place 100% of the time with min questioning cues for 2 consecutive sessions OT Short Term Goal 2 (Week 3): Pt will complete functional bed mobility with min A OT Short Term Goal 3 (Week 3): Pt will complete LB dressing with AE PRN with mod A  Skilled Therapeutic Interventions/Progress Updates:  Session 1 Skilled OT intervention completed with focus on ADL retraining, family education. Pt received with NT assisting pt with brief change, agreeable to session. Soreness reported in R shoulder; declined pain meds and no intervention needed per pt.  Family not present for education yet, but did review sessions from yesterday with family and pt indicated she feels most comfortable with daughter Marisa [name]) assisting, and that husband will only assist via stedy in a pinch vs other daughter and has plan B arranged if Marisa [name] needs to leave briefly. Pt felt comfortable with transfer practice, but desired to have family learn how to do NMR with RUE for continued work at home.   Transitioned EOB with min A for trunk elevation, but no assist for RLE!! Donned shirt with cues for hemi technique over elbow vs just wrist, with overall min A due to emerging tone in RUE, making it difficult to thread shirt to elbow. Pt with urgent need to void again, therefore utilized stedy. CGA sit > stand in stedy, dependent transfer > BSC, supervision sit > stand from perched position with max A for lowering brief. Incontinent/continent of urinary void and continent of BM; nurse notified. Required mod A for toileting steps and cueing for softening R knee due to noted frequent hyperextension. Stedy transfer >  w/c.   [employer] present to tighten bilateral brakes on loaner manual w/c. Education provided to husband on the following: -how to donn/doff R AFO. Required cueing and x2 trials as pt reported pain in ankle with first method. Discussed how unstable joint needs to be protected during footwear. On 2nd trial husband able to donn with superivison and pt without pain -saebo e-stim, with demo on how to use on RUE for NMR including wrist extensors/flexors and discussed basic activities that can be completed during it but plan to review more for shoulder in PM session due to time constraint; handout issued for purchase of large wings  Pt remained seated in w/c with husband present, and with all needs in reach at end of session.  Saebo Stim One  Applied for 60 mins (unattended) to the R wrist flexors for NMR with the following parameters and wash cloth in hand for visual feedback of grasping:  330 pulse width 35 Hz pulse rate On 8 sec/ off 8 sec Ramp up/ down 2 sec Symmetrical Biphasic wave form  Max intensity at 500 Ohm load  Removed from pt at end of cycle with no adverse skin reaction or irritation noted.  Session 2 Skilled OT intervention completed with focus on family education with son present. Pt received seated in w/c, agreeable to session. No pain reported.  Pt requested to show son how to assist her with saebo e-stim since they purchased one as well as other exercises to do on RUE for NMR. Transported dependently in w/c <> gym, at  table top. OT provided demo/assist with the following for NMR/education to son:  -application of saebo to RUE wrist extensors and deltoid, including location, how to operate the machine, safety considerations -activities that can be done during wrist application including reaching for ball, transferring to cup with assist at elbow and wrist -hand on ball with stability from OT, and rolling it forward and back for AAROM (demo of UE ranger but this device  too expensive OOP) -AROM shoulder elevation retraction with facilitation by OT at elbow for increased ROM  Saebo Stim One Parameters (applied to various muscles as described above fpr 15 mins/removed at end of cycle without skin irritation) 330 pulse width 35 Hz pulse rate On 8 sec/ off 8 sec Ramp up/ down 2 sec Symmetrical Biphasic wave form  Max intensity at 500 Ohm load  Pt remained seated in w/c, with son present and with all needs in reach at end of session.    Therapy Documentation Precautions:  Precautions Precautions: Fall Recall of Precautions/Restrictions: Intact Precaution/Restrictions Comments: R hemi Restrictions Weight Bearing Restrictions Per Provider Order: No    Therapy/Group: Individual Therapy  [name], MS, OTR/L  02/27/2024, 3:18 PM
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
## 2024-02-27 NOTE — Progress Notes (Signed)
 Physical Therapy Session Note  Patient Details  Name: Marisa [name] MRN: [id number] Date of Birth: Dec 03, 1960  Today's Date: 02/27/2024 PT Individual Time: 9053-8957 PT Individual Time Calculation (min): 56 min   Short Term Goals: Week 2:  PT Short Term Goal 1 (Week 2): Pt will transfer supine<>sitting EOB with min A consistantly PT Short Term Goal 1 - Progress (Week 2): Met PT Short Term Goal 2 (Week 2): pt will transfer bed<>chair with LRAD and min A PT Short Term Goal 2 - Progress (Week 2): Met PT Short Term Goal 3 (Week 2): pt will ambulate 68ft with LRAD and max A of 1 PT Short Term Goal 3 - Progress (Week 2): Met Week 3:  PT Short Term Goal 1 (Week 3): STG=LTG due to LOS  Skilled Therapeutic Interventions/Progress Updates:   Received pt sitting in [facility] with family present for continued family education training. Pt agreeable to PT treatment and denied any pain during session. Session with emphasis on discharge planning, functional mobility/transfers, generalized strengthening and endurance, dynamic standing balance/coordination, NMR, and gait training. Consulted with primary OT to answer husband's questions regarding Saebo. Pt transported to/from room in [facility] dependently for time management purposes.   Demonstrated technique for bumping pt up/down curb in WC x 2 trials - trial 1 with therapist and trial 2 with pt's husband. Then husband reported having 2 reciprocal steps to enter garage and asking about bumping pt up/down steps if ramp doesn't come in time. Provided pt/family with handout for bumping pt up/down steps in [facility] with 2 people. Stood with hemi walker and CGA x 2 trials and ambulated 60ft x 1 and 62ft x 1 with hemi walker and min A with husband providing close WC follow. Pt required light guarding/blocking at R knee due to occasional light hyperextension. Pt then performed seated BLE strengthening on Kinetron at 20 cm/sec for 1 minute x 4 trials with emphasis on glute/quad  strength and therapist providing manual facilitation at R lateral thigh to prevent excessive hip abduction.  Pt reported urge to void - returned to room and performed stand<>pivot on/off toiet using grab bar and min A. Pt required max A for clothing management but unable to hold urine and soiled brief. Removed soiled brief and performed hygiene management dependently. Donned clean brief, then returned to [facility] and sat at sink to perform hand hygiene and wash face. Alll needs within reach and family at bedside.  Therapy Documentation Precautions:  Precautions Precautions: Fall Recall of Precautions/Restrictions: Intact Precaution/Restrictions Comments: R dense hemi Restrictions Weight Bearing Restrictions Per Provider Order: No  Therapy/Group: Individual Therapy Marisa [name] Marisa Stains PT, DPT 02/27/2024, 6:50 AM

## 2024-02-28 DIAGNOSIS — K5901 Slow transit constipation: Secondary | ICD-10-CM

## 2024-02-28 NOTE — Plan of Care (Signed)
" °  Problem: Consults Goal: RH STROKE PATIENT EDUCATION Description: See Patient Education module for education specifics  Outcome: Progressing Goal: Nutrition Consult-if indicated Outcome: Progressing Goal: Diabetes Guidelines if Diabetic/Glucose > 140 Description: If diabetic or lab glucose is > 140 mg/dl - Initiate Diabetes/Hyperglycemia Guidelines & Document Interventions  Outcome: Progressing   Problem: RH BOWEL ELIMINATION Goal: RH STG MANAGE BOWEL WITH ASSISTANCE Description: STG Manage Bowel with mod I Assistance. Outcome: Progressing Goal: RH STG MANAGE BOWEL W/MEDICATION W/ASSISTANCE Description: STG Manage Bowel with Medication with mod I Assistance. Outcome: Progressing   Problem: RH BLADDER ELIMINATION Goal: RH STG MANAGE BLADDER WITH ASSISTANCE Description: STG Manage Bladder With toileting Assistance Outcome: Progressing   Problem: RH SAFETY Goal: RH STG ADHERE TO SAFETY PRECAUTIONS W/ASSISTANCE/DEVICE Description: STG Adhere to Safety Precautions With cues Assistance/Device. Outcome: Progressing   Problem: RH KNOWLEDGE DEFICIT Goal: RH STG INCREASE KNOWLEDGE OF DIABETES Description: Patient and spouse will be able to manage DM using educational resources for medications and dietary modification independently Outcome: Progressing Goal: RH STG INCREASE KNOWLEDGE OF HYPERTENSION Description: Patient and spouse will be able to manage HTN using educational resources for medications and dietary modification independently Outcome: Progressing Goal: RH STG INCREASE KNOWLEGDE OF HYPERLIPIDEMIA Description: Patient and spouse will be able to manage HLD using educational resources for medications and dietary modification independently Outcome: Progressing Goal: RH STG INCREASE KNOWLEDGE OF STROKE PROPHYLAXIS Description: Patient and spouse will be able to manage secondary risks using educational resources for medications and dietary modification independently Outcome:  Progressing   "

## 2024-02-28 NOTE — Progress Notes (Signed)
 Speech Language Pathology Daily Session Note  Patient Details  Name: Marisa [name] MRN: [id number] Date of Birth: 28-Nov-1960  Today's Date: 02/28/2024 SLP Individual Time: 8597-8552 SLP Individual Time Calculation (min): 45 min  Short Term Goals: Week 3: SLP Short Term Goal 1 (Week 3): STGs = LTGs d/t ELOS  Skilled Therapeutic Interventions:  Pt seen for ST targeting speech goals. Pt denied pain. SLP facilitated IOPI exercises targeting labial and lingual strength. Pt completed 15 labial exercises at 12 kpa and 20 anterior lingual placement at 40 kpa with an additional 10 at 50 kpa. Pt's speech noted to be clear and intelligible throughout session. She didn't require A for thought organization this date. Pt requested assistance to the bathroom. SLP provided A using the Stedy and pt was continent of bladder. Pt left in chair with call bell and alarm present. Continue ST POC.  Pain Pain Assessment Pain Scale: 0-10 Pain Score: 0-No pain  Therapy/Group: Individual Therapy  Marisa JONETTA Novak, MA CCC-SLP 02/28/2024, 4:31 PM

## 2024-02-28 NOTE — Progress Notes (Signed)
" °   02/28/24 0105  BiPAP/CPAP/SIPAP  $ Non-Invasive Home Ventilator  Subsequent  BiPAP/CPAP/SIPAP Pt Type Adult  BiPAP/CPAP/SIPAP Resmed  Mask Type Full face mask  Respiratory Rate 18 breaths/min  FiO2 (%) 21 %  Patient Home Machine No  Patient Home Mask No  Patient Home Tubing No  Auto Titrate Yes  Minimum cmH2O 4 cmH2O  Maximum cmH2O 10 cmH2O  Device Plugged into RED Power Outlet Yes    "

## 2024-02-29 NOTE — Plan of Care (Signed)
" °  Problem: Consults Goal: RH STROKE PATIENT EDUCATION Description: See Patient Education module for education specifics  Outcome: Progressing Goal: Nutrition Consult-if indicated Outcome: Progressing Goal: Diabetes Guidelines if Diabetic/Glucose > 140 Description: If diabetic or lab glucose is > 140 mg/dl - Initiate Diabetes/Hyperglycemia Guidelines & Document Interventions  Outcome: Progressing   Problem: RH BOWEL ELIMINATION Goal: RH STG MANAGE BOWEL WITH ASSISTANCE Description: STG Manage Bowel with mod I Assistance. Outcome: Progressing Goal: RH STG MANAGE BOWEL W/MEDICATION W/ASSISTANCE Description: STG Manage Bowel with Medication with mod I Assistance. Outcome: Progressing   Problem: RH BLADDER ELIMINATION Goal: RH STG MANAGE BLADDER WITH ASSISTANCE Description: STG Manage Bladder With toileting Assistance Outcome: Progressing   "

## 2024-02-29 NOTE — Progress Notes (Signed)
 Physical Therapy Session Note  Patient Details  Name: Marisa [name] MRN: [id number] Date of Birth: 1960/06/12  Today's Date: 02/29/2024 PT Individual Time: 0902-1000 PT Individual Time Calculation (min): 58 min   Short Term Goals: Week 3:  PT Short Term Goal 1 (Week 3): STG=LTG due to LOS  Skilled Therapeutic Interventions/Progress Updates:     Pt semi-reclined in bed upon arrival. Pt reports 7/10 pain in her L wrist, premedicated. Agreeable to therapy. Session emphasized functional strengthening and independence with ADLs and transfers. Pt requested to sponge bathe. Pt provided pt with warm washcloths and pt cleaned face, neck, and chest using L UE without physical assist but then required assistance for underarms, trunk, and lower body. Pt performed R/L rolling using bed rails with CGA to assist PT with changing brief and donning pants. Pt then transferred to sitting EOB with min A. In seated position, pt donned shirt with min A. Pt then requested to use toilet. Pt transferred EOB to [facility] over toilet via [place] with CGA for sit to stands. After toileting, transferred to [facility] [place]. Pt propelled self to sink where she brushed teeth with SU assist. Pt then positioned for comfort in [facility] where she remained with all needs in reach at end of session.  Therapy Documentation Precautions:  Precautions Precautions: Fall Recall of Precautions/Restrictions: Intact Precaution/Restrictions Comments: R hemi Restrictions Weight Bearing Restrictions Per Provider Order: No  Therapy/Group: Individual Therapy  Comer CHRISTELLA Levora Comer Levora, PT, DPT 02/29/2024, 7:44 AM

## 2024-02-29 NOTE — Progress Notes (Signed)
 "                                                        PROGRESS NOTE   Subjective/Complaints:  Pt doing well again today, slept well, denies pain this morning, LBM yesterday, urinating fine. No other complaints or concerns.   ROS: +right sided hemiplegia- improving See HPI,  denies fevers/chills, CP, SOB, abd pain, n/v/d/c fatigue has decreased +L wrist soreness   Objective:   No results found.    No results for input(s): WBC, HGB, HCT, PLT in the last 72 hours.    No results for input(s): NA, K, CL, CO2, GLUCOSE, BUN, CREATININE, CALCIUM  in the last 72 hours.   Intake/Output Summary (Last 24 hours) at 02/29/2024 0847 Last data filed at 02/28/2024 1901 Gross per 24 hour  Intake 480 ml  Output 100 ml  Net 380 ml        Physical Exam: Vital Signs Blood pressure 134/68, pulse (!) 55, temperature 97.9 F (36.6 C), temperature source Oral, resp. rate 16, height 5' (1.524 m), weight 105.7 kg, SpO2 98%.  Gen: no distress, sitting upright in bed eating breakfast, comfortable HEENT: oral mucosa pink and moist, NCAT Cardio: bradycardic, reg rhythm, no m/r/g appreciated Chest: normal effort, normal rate of breathing, CTAB Abd: soft, non-distended, nontender, +BS throughout Ext: BLE nonpitting edema, improved in RLE Psych: pleasant, normal affect, interactive Skin: intact to exposed surfaces  PRIOR EXAMS: Neuro: Alert and oriented x3, 5/5 strength on left side, 0/5 strength throughout right UE, right lower extremity with 3/5 KE,HF, and PF/DF, sensation is intact MSK: No significant wrist tenderness, minimal pain with wrist ROM.  She points to mild area of soreness left lateral wrist.   Assessment/Plan: 1. Functional deficits which require 3+ hours per day of interdisciplinary therapy in a comprehensive inpatient rehab setting. Physiatrist is providing close team supervision and 24 hour management of active medical problems listed below. Physiatrist  and rehab team continue to assess barriers to discharge/monitor patient progress toward functional and medical goals  Care Tool:  Bathing    Body parts bathed by patient: Right arm, Chest, Abdomen, Front perineal area, Buttocks, Right upper leg, Left upper leg, Face   Body parts bathed by helper: Left arm, Right lower leg, Right upper leg     Bathing assist Assist Level: Minimal Assistance - Patient > 75%     Upper Body Dressing/Undressing Upper body dressing   What is the patient wearing?: Pull over shirt    Upper body assist Assist Level: Minimal Assistance - Patient > 75%    Lower Body Dressing/Undressing Lower body dressing      What is the patient wearing?: Pants, Underwear/pull up     Lower body assist Assist for lower body dressing: Maximal Assistance - Patient 25 - 49%     Toileting Toileting    Toileting assist Assist for toileting: Moderate Assistance - Patient 50 - 74%     Transfers Chair/bed transfer  Transfers assist     Chair/bed transfer assist level: Minimal Assistance - Patient > 75% (stand<>pivot)     Locomotion Ambulation   Ambulation assist      Assist level: 2 helpers Assistive device: Walker-hemi Max distance: 54ft   Walk 10 feet activity   Assist     Assist level: 2 helpers Assistive  device: Walker-hemi   Walk 50 feet activity   Assist Walk 50 feet with 2 turns activity did not occur: Safety/medical concerns (weakness, fatigue)  Assist level: 2 helpers Assistive device: Other (comment) (3 musketeers)    Walk 150 feet activity   Assist Walk 150 feet activity did not occur: Safety/medical concerns         Walk 10 feet on uneven surface  activity   Assist Walk 10 feet on uneven surfaces activity did not occur: Safety/medical concerns         Wheelchair     Assist Is the patient using a wheelchair?: Yes Type of Wheelchair: Manual    Wheelchair assist level: Independent Max wheelchair distance:  170ft    Wheelchair 50 feet with 2 turns activity    Assist        Assist Level: Independent   Wheelchair 150 feet activity     Assist      Assist Level: Independent   Blood pressure 134/68, pulse (!) 55, temperature 97.9 F (36.6 C), temperature source Oral, resp. rate 16, height 5' (1.524 m), weight 105.7 kg, SpO2 98%.  Medical Problem List and Plan: 1. 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 Continue Asa and Plavix  with statin. Follow up with Dr. Chalice in 4 weeks.              -patient may  shower -ELOS/Goals: 14-18 days due to R hemiplegia- Min A hopefully, goals, mod I to supervision for SLP/dysarthria Chart and therapy notes reviewed, continue CIR, discussed patient's progress with therapy Grounds pass ordered Vitamin D3/Metanx/Vitamin B/C complex ordered F/u with Dr. JONELLE or Fidela in clinic within 1 month Would benefit from home health aide upon discharge  Continue CIR, d/c goal 1/29--> 03/01/24 Estim ordered for disuse atrophy and non-narcotic treatment of pain (left wrist) Discussed with patient that she was told that hospital bed would be covered for her   2.  Antithrombotics: continue Lovenox  40mg  daily; antiplatelet ASA and plavix  daily   3. Pain Management: Tylenol  prn    4. Mood/Behavior/Sleep: LCSW to follow for evaluation and support when available.              -antipsychotic agents: N/A  -trazodone  PRN   5. Neuropsych/cognition: This patient is capable of making decisions on her own behalf.   6. Skin/Wound Care: Routine pressure relief measures.    7. FEN/AKI: encouraged oral hydration, continue vitamins/supplements, continue routine labs.    8. Screening for vitamin D  deficiency: D level 28.1 on 02/06/24, continue D3 2,000U daily   9. HTN: BP reviewed and is normal, amlodipine  d/ced due to leg swelling, d/c chorthalidone 25mg  daily, d/c avapro ,  Monitor BP with activity. D/c  hydrochlorothiazide  as DBP is in 50s and SBP is well controlled. Just on nebivolol  5mg  daily  -02/29/24 BPs fine except one elevation yesterday, monitor  Vitals:   02/25/24 1644 02/25/24 2002 02/26/24 0510 02/26/24 1755  BP: (!) 144/72 139/71 128/62 (!) 140/73   02/26/24 1929 02/27/24 0453 02/27/24 1604 02/27/24 1947  BP: 138/67 (!) 124/51 138/71 139/76   02/28/24 0511 02/28/24 1515 02/28/24 2021 02/29/24 0331  BP: 130/67 133/71 (!) 152/79 134/68     10. DM2: A1c 7.2, Home metformin  held, d/c Farxiga . D/c ISS, CBGs reviewed and remain well controlled, will discontinue CBG checks and we can monitor fasting glucose with biweekly BMPs   11. HLD: continue  Lipitor 80 mg daily, discussed that repeat lipid  panel shows improved LDL and total cholesterol and worse HDL   12. OSA: continue CPAP nightly    13. Left Thyroid Nodule: 2.2 cm thyroid nodule found on CT.  Follow up outpatient for non emergent thyroid US .    14. Class 4 Obesity:  BMI is 47.85 kg/m . Educate on diet and weight loss to promote overall health and mobility. Continue magnesium  supplement   15. Constipation- will order Miralax  daily that she's taking as well as Dulcolax suppository tonight to help her get back on schedule. - She also wants apple juice. Mag oxide added HS. continue senokot s 2 tabs nightly. LBM 02/28/24 per pt, not documented  16. Hypokalemia: klor ordered on 1/19, kdur ordered 1/20, 40 klor ordered 1/22, 40 klor ordered 1/26, 20mg  daily supplement started 1/29  17. Right hand swelling: US  reviewed and is negative for clot- discussed with patient  26. RLE swelling: Vas US  ordered- discussed that this was negative for clot, discussed that swelling has improved after discontinuation of amlodipine , BP reviewed and remains stable off amlodipine   19.  Left wrist soreness: Suspect mild sprain after fall.  No fractures noted on x-ray. Continue Voltaren  gel ordered  20. Bradycardia: decrease Bystolic  to 5mg   daily, continue this dose  21. Anemia: Hgb 10-11 range, monitor   LOS: 24 days A FACE TO FACE EVALUATION WAS PERFORMED  [address] 02/29/2024, 8:47 AM     "

## 2024-03-01 ENCOUNTER — Other Ambulatory Visit (HOSPITAL_COMMUNITY): Payer: Self-pay

## 2024-03-01 DIAGNOSIS — R001 Bradycardia, unspecified: Secondary | ICD-10-CM | POA: Diagnosis present

## 2024-03-01 DIAGNOSIS — E041 Nontoxic single thyroid nodule: Secondary | ICD-10-CM | POA: Diagnosis present

## 2024-03-01 DIAGNOSIS — K5900 Constipation, unspecified: Secondary | ICD-10-CM | POA: Diagnosis present

## 2024-03-01 DIAGNOSIS — M7989 Other specified soft tissue disorders: Secondary | ICD-10-CM | POA: Diagnosis present

## 2024-03-01 DIAGNOSIS — M25432 Effusion, left wrist: Secondary | ICD-10-CM | POA: Diagnosis present

## 2024-03-01 DIAGNOSIS — D649 Anemia, unspecified: Secondary | ICD-10-CM

## 2024-03-01 DIAGNOSIS — R2231 Localized swelling, mass and lump, right upper limb: Secondary | ICD-10-CM | POA: Diagnosis present

## 2024-03-01 DIAGNOSIS — E876 Hypokalemia: Secondary | ICD-10-CM | POA: Diagnosis present

## 2024-03-01 LAB — CBC WITH DIFFERENTIAL/PLATELET
Abs Immature Granulocytes: 0.01 10*3/uL (ref 0.00–0.07)
Basophils Absolute: 0 10*3/uL (ref 0.0–0.1)
Basophils Relative: 0 %
Eosinophils Absolute: 0.1 10*3/uL (ref 0.0–0.5)
Eosinophils Relative: 2 %
HCT: 34.6 % — ABNORMAL LOW (ref 36.0–46.0)
Hemoglobin: 11.1 g/dL — ABNORMAL LOW (ref 12.0–15.0)
Immature Granulocytes: 0 %
Lymphocytes Relative: 42 %
Lymphs Abs: 2.6 10*3/uL (ref 0.7–4.0)
MCH: 22.5 pg — ABNORMAL LOW (ref 26.0–34.0)
MCHC: 32.1 g/dL (ref 30.0–36.0)
MCV: 70 fL — ABNORMAL LOW (ref 80.0–100.0)
Monocytes Absolute: 0.4 10*3/uL (ref 0.1–1.0)
Monocytes Relative: 7 %
Neutro Abs: 2.9 10*3/uL (ref 1.7–7.7)
Neutrophils Relative %: 49 %
Platelets: 208 10*3/uL (ref 150–400)
RBC: 4.94 MIL/uL (ref 3.87–5.11)
RDW: 17.2 % — ABNORMAL HIGH (ref 11.5–15.5)
WBC: 6.1 10*3/uL (ref 4.0–10.5)
nRBC: 0 % (ref 0.0–0.2)

## 2024-03-01 LAB — BASIC METABOLIC PANEL WITH GFR
Anion gap: 11 (ref 5–15)
BUN: 14 mg/dL (ref 8–23)
CO2: 23 mmol/L (ref 22–32)
Calcium: 8.6 mg/dL — ABNORMAL LOW (ref 8.9–10.3)
Chloride: 105 mmol/L (ref 98–111)
Creatinine, Ser: 0.79 mg/dL (ref 0.44–1.00)
GFR, Estimated: >60 mL/min
Glucose, Bld: 93 mg/dL (ref 70–99)
Potassium: 3.9 mmol/L (ref 3.5–5.1)
Sodium: 139 mmol/L (ref 135–145)

## 2024-03-01 MED ORDER — ATORVASTATIN CALCIUM 80 MG PO TABS
80.0000 mg | ORAL_TABLET | Freq: Every day | ORAL | 0 refills | Status: AC
  Filled 2024-03-01: qty 30, 30d supply, fill #0

## 2024-03-01 MED ORDER — DICLOFENAC SODIUM 1 % EX GEL: 2.0000 g | Freq: Four times a day (QID) | CUTANEOUS | Status: AC

## 2024-03-01 MED ORDER — SENNOSIDES-DOCUSATE SODIUM 8.6-50 MG PO TABS
2.0000 | ORAL_TABLET | Freq: Every day | ORAL | 0 refills | Status: AC
  Filled 2024-03-01: qty 60, 30d supply, fill #0

## 2024-03-01 MED ORDER — MAGNESIUM OXIDE -MG SUPPLEMENT 400 (240 MG) MG PO TABS
200.0000 mg | ORAL_TABLET | Freq: Every day | ORAL | 0 refills | Status: AC
  Filled 2024-03-01: qty 15, 30d supply, fill #0

## 2024-03-01 MED ORDER — CLOPIDOGREL BISULFATE 75 MG PO TABS
75.0000 mg | ORAL_TABLET | Freq: Every day | ORAL | 0 refills | Status: AC
  Filled 2024-03-01: qty 30, 30d supply, fill #0

## 2024-03-01 MED ORDER — NEBIVOLOL HCL 5 MG PO TABS
5.0000 mg | ORAL_TABLET | Freq: Every day | ORAL | 0 refills | Status: AC
  Filled 2024-03-01: qty 30, 30d supply, fill #0

## 2024-03-01 MED ORDER — ACETAMINOPHEN 325 MG PO TABS: 325.0000 mg | ORAL_TABLET | ORAL | Status: AC | PRN

## 2024-03-01 MED ORDER — POTASSIUM CHLORIDE CRYS ER 20 MEQ PO TBCR
20.0000 meq | EXTENDED_RELEASE_TABLET | Freq: Every day | ORAL | 0 refills | Status: AC
  Filled 2024-03-01: qty 30, 30d supply, fill #0

## 2024-03-01 MED ORDER — B COMPLEX-C PO TABS
1.0000 | ORAL_TABLET | Freq: Every day | ORAL | 0 refills | Status: AC
  Filled 2024-03-01: qty 100, 100d supply, fill #0

## 2024-03-01 MED ORDER — VITAMIN D3 25 MCG PO TABS
3000.0000 [IU] | ORAL_TABLET | Freq: Every day | ORAL | 0 refills | Status: AC
  Filled 2024-03-01: qty 90, 30d supply, fill #0

## 2024-03-01 NOTE — Progress Notes (Signed)
 Occupational Therapy Discharge Summary  Patient Details  Name: Marisa [name] MRN: [id number] Date of Birth: 1960-09-30  Date of Discharge from OT service:February 29, 2024   Patient has met 10 of 12 long term goals due to improved activity tolerance, improved balance, ability to compensate for deficits, functional use of  RIGHT upper and RIGHT lower extremity, improved awareness, and improved coordination.  Patient to discharge at [facility] Assist level.  Patient's care partner is independent to provide the necessary physical assistance at discharge.    Reasons goals not met: LB dressing toilet transfer goals not met due to increased assist needed for dense R hemiplegia and safety   Recommendation:  Patient will benefit from ongoing skilled OT services in outpatient setting to continue to advance functional skills in the area of BADL, iADL, and Reduce care partner burden.  Equipment: Drop arm BSC  Reasons for discharge: treatment goals met and discharge from hospital  Patient/family agrees with progress made and goals achieved: Yes  OT Discharge Precautions/Restrictions  Precautions Precautions: Fall Precaution/Restrictions Comments: R hemi Restrictions Weight Bearing Restrictions Per Provider Order: No ADL ADL Eating: Set up Where Assessed-Eating: Wheelchair Grooming: Modified independent Where Assessed-Grooming: Sitting at sink Upper Body Bathing: Minimal assistance Where Assessed-Upper Body Bathing: Shower Lower Body Bathing: Minimal assistance Where Assessed-Lower Body Bathing: Shower Upper Body Dressing: Supervision/safety Where Assessed-Upper Body Dressing: Wheelchair Lower Body Dressing: Moderate assistance Where Assessed-Lower Body Dressing: Sitting at sink, Standing at sink Toileting: Minimal assistance Where Assessed-Toileting: Bedside Commode Toilet Transfer: Minimal assistance Toilet Transfer Method: Stand pivot Toilet Transfer Equipment: Bedside  commode Tub/Shower Transfer: Not assessed Tub/Shower Transfer Method: Unable to assess Film/video Editor: Not assessed Film/video Editor Method: Unable to assess Vision Baseline Vision/History: 0 No visual deficits Patient Visual Report: No change from baseline Vision Assessment?: No apparent visual deficits Perception  Perception: Within Functional Limits Praxis Praxis: WFL Cognition Cognition Overall Cognitive Status: Within Functional Limits for tasks assessed Arousal/Alertness: Awake/alert Orientation Level: Person;Place;Situation Person: Oriented Place: Oriented Situation: Oriented Memory: Appears intact Awareness: Appears intact Problem Solving: Impaired Safety/Judgment: Appears intact Brief Interview for Mental Status (BIMS) Repetition of Three Words (First Attempt): 3 Temporal Orientation: Year: Correct Temporal Orientation: Month: Accurate within 5 days Temporal Orientation: Day: Correct Recall: Sock: Yes, no cue required Recall: Blue: Yes, no cue required Recall: Bed: Yes, no cue required BIMS Summary Score: 15 Sensation Sensation Light Touch: Appears Intact Hot/Cold: Not tested Proprioception: Impaired by gross assessment Stereognosis: Not tested Coordination Gross Motor Movements are Fluid and Coordinated: No Fine Motor Movements are Fluid and Coordinated: No Coordination and Movement Description: grossly uncoordinated due to R hemi, decreased balance/coordination, and weakness/deconditioning Finger Nose Finger Test: Impaired RUE Motor  Motor Motor: Hemiplegia Motor - Skilled Clinical Observations: R hemiparesis, decreased balance/coordination, and impaired sequencing Mobility  Bed Mobility Bed Mobility: Rolling Right;Rolling Left;Sit to Supine;Supine to Sit Rolling Right: Supervision/verbal cueing Rolling Left: Contact Guard/Touching assist Supine to Sit: Minimal Assistance - Patient > 75% Sit to Supine: Minimal Assistance -  Patient > 75% Transfers Sit to Stand: Contact Guard/Touching assist Stand to Sit: Contact Guard/Touching assist  Trunk/Postural Assessment  Cervical Assessment Cervical Assessment: Within Functional Limits Thoracic Assessment Thoracic Assessment: Exceptions to [facility] (rounded shoulder) Lumbar Assessment Lumbar Assessment: Exceptions to [facility] (posterior pelvic tilt) Postural Control Postural Control: Deficits on evaluation Righting Reactions: delayed R Protective Responses: delayed R  Balance Balance Balance Assessed: Yes Static Sitting Balance Static Sitting - Balance Support: Feet supported;No upper extremity supported Static Sitting -  Level of Assistance: 7: Independent Dynamic Sitting Balance Dynamic Sitting - Balance Support: Feet supported;No upper extremity supported Dynamic Sitting - Level of Assistance: 6: Modified independent (Device/Increase time) Static Standing Balance Static Standing - Balance Support: Left upper extremity supported;During functional activity (hemi walker) Static Standing - Level of Assistance: 5: Stand by assistance (very close supervision) Dynamic Standing Balance Dynamic Standing - Level of Assistance: 4: Min assist Dynamic Standing - Comments: with trasnfers; requires +2 for gait Extremity/Trunk Assessment RUE Assessment RUE Assessment: Exceptions to [facility] RUE Body System: Neuro Brunstrum levels for arm and hand: Arm;Hand Brunstrum level for arm: Stage II Synergy is developing Brunstrum level for hand: Stage I Flaccidity LUE Assessment LUE Assessment: Within Functional Limits   Garwood Wentzell E Jobanny Mavis, MS, OTR/L  03/01/2024, 1:43 PM

## 2024-03-01 NOTE — Discharge Instructions (Addendum)
 Inpatient Rehab Discharge Instructions  Marisa [name]  Discharge date and time: 03/02/2024  Activities/Precautions/ Functional Status:  Activity: no lifting, driving, or strenuous exercise for until cleared by provider.   Diet: Heart healthy-limit carbohydrates (diabetic diet)  Wound Care: none needed   Functional status:  ___ No restrictions     ___ Walk up steps independently __X_ 24/7 supervision/assistance   _X__ Walk up steps with assistance ___ Intermittent supervision/assistance  ___ Bathe/dress independently ___ Walk with walker     _X__ Bathe/dress with assistance ___ Walk Independently    ___ Shower independently _X__ Walk with assistance    _X__ Shower with assistance __X_ No alcohol     ___ Return to work/school ________  Special Instructions:  -Follow up with pcp regarding incidental finding of 2.2 cm left thyroid nodule.   -Monitor your blood pressure 3 times a day, keep a log of your readings for follow up appointments.    My questions have been answered and I understand these instructions. I will adhere to these goals and the provided educational materials after my discharge from the hospital.  Patient/Caregiver Signature _______________________________ Date __________  Clinician Signature _______________________________________ Date __________  Please bring this form and your medication list with you to all your follow-up doctor's appointments.    COMMUNITY REFERRALS UPON DISCHARGE:    Outpatient: PT     OT    ST                 Agency:  [facility]   Phone: [phone]              Appointment Date/Time: *Please expect follow-up within 7-10 business days to schedule your appointment. If you have not received follow-up, be sure to contact the site directly.*   Medical Equipment/Items Ordered:  Drop arm commode and semi-electric hospital bed                                                 Agency/Supplier: [employer]      STROKE/TIA DISCHARGE INSTRUCTIONS SMOKING Cigarette smoking nearly doubles your risk of having a stroke & is the single most alterable risk factor  If you smoke or have smoked in the last 12 months, you are advised to quit smoking for your health. Most of the excess cardiovascular risk related to smoking disappears within a year of stopping. Ask you doctor about anti-smoking medications [place] Quit Line: 1-800-QUIT NOW Free Smoking Cessation Classes [phone]  CHOLESTEROL Know your levels; limit fat & cholesterol in your diet  Lipid Panel     Component Value Date/Time   CHOL 75 02/12/2024 0429   CHOL 167 12/30/2023 1143   TRIG 98 02/12/2024 0429   HDL 30 (L) 02/12/2024 0429   HDL 47 12/30/2023 1143   CHOLHDL 2.5 02/12/2024 0429   VLDL 20 02/12/2024 0429   LDLCALC 25 02/12/2024 0429   LDLCALC 93 12/30/2023 1143     Many patients benefit from treatment even if their cholesterol is at goal. Goal: Total Cholesterol (CHOL) less than 160 Goal:  Triglycerides (TRIG) less than 150 Goal:  HDL greater than 40 Goal:  LDL (LDLCALC) less than 100   BLOOD PRESSURE American Stroke Association blood pressure target is less that 120/80 mm/Hg  Your discharge blood pressure is:  BP: (!) 142/82 Monitor your blood pressure Limit  your salt and alcohol intake Many individuals will require more than one medication for high blood pressure  DIABETES (A1c is a blood sugar average for last 3 months) Goal HGBA1c is under 7% (HBGA1c is blood sugar average for last 3 months)  Diabetes: Diagnosis of diabetes:  Your A1c:7.2 %    Lab Results  Component Value Date   HGBA1C 7.2 (H) 12/30/2023    Your HGBA1c can be lowered with medications, healthy diet, and exercise. Check your blood sugar as directed by your physician Call your physician if you experience unexplained or low blood sugars.  PHYSICAL ACTIVITY/REHABILITATION Goal is 30 minutes at least 4 days per week  Activity: Increase activity  slowly,, No driving,, and Walk with assistance, Therapies: Physical Therapy: Outpatient, Occupational Therapy: Outpatient, and Speech Therapy: Outpatient Return to work: n/a Activity decreases your risk of heart attack and stroke and makes your heart stronger.  It helps control your weight and blood pressure; helps you relax and can improve your mood. Participate in a regular exercise program. Talk with your doctor about the best form of exercise for you (dancing, walking, swimming, cycling).  DIET/WEIGHT Goal is to maintain a healthy weight  Your discharge diet is:  Diet Order             Diet heart healthy/carb modified Room service appropriate? Yes; Fluid consistency: Thin  Diet effective now                  Your height is:  Height: 5' (152.4 cm) Your current weight is: Weight: 105.7 kg Your Body Mass Index (BMI) is:  BMI (Calculated): 45.51 Following the type of diet specifically designed for you will help prevent another stroke. Your goal Body Mass Index (BMI) is 19-24. Healthy food habits can help reduce 3 risk factors for stroke:  High cholesterol, hypertension, and excess weight.  RESOURCES Stroke/Support Group:  Call [phone]   STROKE EDUCATION PROVIDED/REVIEWED AND GIVEN TO PATIENT Stroke warning signs and symptoms How to activate emergency medical system (call 911). Medications prescribed at discharge. Need for follow-up after discharge. Personal risk factors for stroke. Pneumonia vaccine given: No Flu vaccine given: No My questions have been answered, the writing is legible, and I understand these instructions.  I will adhere to these goals & educational materials that have been provided to me after my discharge from the hospital.

## 2024-03-01 NOTE — Plan of Care (Signed)
" °  Problem: Consults Goal: RH STROKE PATIENT EDUCATION Description: See Patient Education module for education specifics  Outcome: Progressing Goal: Nutrition Consult-if indicated Outcome: Progressing Goal: Diabetes Guidelines if Diabetic/Glucose > 140 Description: If diabetic or lab glucose is > 140 mg/dl - Initiate Diabetes/Hyperglycemia Guidelines & Document Interventions  Outcome: Progressing   Problem: RH BOWEL ELIMINATION Goal: RH STG MANAGE BOWEL WITH ASSISTANCE Description: STG Manage Bowel with mod I Assistance. Outcome: Progressing Goal: RH STG MANAGE BOWEL W/MEDICATION W/ASSISTANCE Description: STG Manage Bowel with Medication with mod I Assistance. Outcome: Progressing   Problem: RH BLADDER ELIMINATION Goal: RH STG MANAGE BLADDER WITH ASSISTANCE Description: STG Manage Bladder With toileting Assistance Outcome: Progressing   Problem: RH SAFETY Goal: RH STG ADHERE TO SAFETY PRECAUTIONS W/ASSISTANCE/DEVICE Description: STG Adhere to Safety Precautions With cues Assistance/Device. Outcome: Progressing   Problem: RH KNOWLEDGE DEFICIT Goal: RH STG INCREASE KNOWLEDGE OF DIABETES Description: Patient and spouse will be able to manage DM using educational resources for medications and dietary modification independently Outcome: Progressing Goal: RH STG INCREASE KNOWLEDGE OF HYPERTENSION Description: Patient and spouse will be able to manage HTN using educational resources for medications and dietary modification independently Outcome: Progressing Goal: RH STG INCREASE KNOWLEGDE OF HYPERLIPIDEMIA Description: Patient and spouse will be able to manage HLD using educational resources for medications and dietary modification independently Outcome: Progressing Goal: RH STG INCREASE KNOWLEDGE OF STROKE PROPHYLAXIS Description: Patient and spouse will be able to manage secondary risks using educational resources for medications and dietary modification independently Outcome:  Progressing   "

## 2024-03-01 NOTE — Progress Notes (Addendum)
 Patient ID: Marisa [name], female   DOB: 02-14-1960, 64 y.o.   MRN: [id number]  Due to the delay in bed delivery, Mr. Milburn plans to pick up patient tomorrow. Mr. Swenor was informd that because there is only 1 delivery driver they can't guarantee the bed will be delviered today

## 2024-03-02 ENCOUNTER — Other Ambulatory Visit (HOSPITAL_COMMUNITY): Payer: Self-pay

## 2024-03-02 MED ORDER — HYDROCHLOROTHIAZIDE 12.5 MG PO TABS: 6.2500 mg | ORAL_TABLET | Freq: Every day | ORAL | Status: DC

## 2024-03-02 MED ORDER — HYDROCHLOROTHIAZIDE 12.5 MG PO TABS
6.2500 mg | ORAL_TABLET | Freq: Every day | ORAL | 0 refills | Status: DC
  Filled 2024-03-02: qty 15, 30d supply, fill #0

## 2024-03-02 NOTE — Progress Notes (Signed)
 Inpatient Rehabilitation Discharge Medication Review by a Pharmacist  A complete drug regimen review was completed for this patient to identify any potential clinically significant medication issues.  High Risk Drug Classes Is patient taking? Indication by Medication  Antipsychotic No   Anticoagulant No   Antibiotic No   Opioid No   Antiplatelet Yes Clopidogrel  - CVA prophylaxis  Hypoglycemics/insulin  Yes Dapagliflozin  - DM Insulin  aspart - DM  Vasoactive Medication Yes Nebivolol  - HTN  Chemotherapy No   Other Yes APAP - prn pain Vitamin C  - supplement Vitamin B - supplement Mag-ox- supplement  Kcl- supplement  Atorvastatin  - HLD Vitamin D  - supplement Linzess  145mg  daily  Metformin  1gm PO BID SennaS - prn constipation Folic Acid  1mg  dai     Type of Medication Issue Identified Description of Issue Recommendation(s)  Drug Interaction(s) (clinically significant)     Duplicate Therapy     Allergy     No Medication Administration End Date     Incorrect Dose     Additional Drug Therapy Needed     Significant med changes from prior encounter (inform family/care partners about these prior to discharge). Atorvastatin  40mg  > 80mg  DOSE INCREASED during inpatient admission.    Other  PTA medications discontinued- amlodipine , dapagliflozin    Chlorthalidone , olmesartan  - paused on discharge  Resume as appropriate.    Clinically significant medication issues were identified that warrant physician communication and completion of prescribed/recommended actions by midnight of the next day:  No  Name of provider notified for urgent issues identified: N/A    Time spent performing this drug regimen review (minutes):  20   Massie Fila, PharmD Clinical Pharmacist  03/02/2024 7:58 AM

## 2024-03-02 NOTE — Progress Notes (Signed)
 Patient ID: CYBILL URIEGAS, female   DOB: Aug 07, 1960, 64 y.o.   MRN: [id number]  Faxed FMLA paperwork for Paul Torpey, Mickey to [phone].   Physical copy to be mailed to patients home.

## 2024-03-02 NOTE — Progress Notes (Signed)
 Inpatient Rehabilitation Care Coordinator Discharge Note   Patient Details  Name: Marisa [name] MRN: [id number] Date of Birth: Jun 21, 1960   Discharge location: Home with spouse and family providing care  Length of Stay: 25 days  Discharge activity level: Supervision/Verbal cueing  Home/community participation: Limited activity in community  Patient response un:Marisa [name] Literacy - How often do you need to have someone help you when you read instructions, pamphlets, or other written material from your doctor or pharmacy?: Never  Patient response un:Marisa [name] Isolation - How often do you feel lonely or isolated from those around you?: Never  Services provided included: MD, RD, PT, OT, SLP, RN, CM, TR, Pharmacy, SW  Financial Services:  Financial Services Utilized: Private Insurance [employer] / [employer]  Choices offered to/list presented to: Patient/Spouse  Follow-up services arranged:  Outpatient, DME    Outpatient Servicies: [facility] for PT/OT/SLP DME : Hospital bed, 3-1 commode    Patient response to transportation need: Is the patient able to respond to transportation needs?: Yes In the past 12 months, has lack of transportation kept you from medical appointments or from getting medications?: No In the past 12 months, has lack of transportation kept you from meetings, work, or from getting things needed for daily living?: No   Patient/Family verbalized understanding of follow-up arrangements:  Yes  Individual responsible for coordination of the follow-up plan: Patient/Spouse  Confirmed correct DME delivered: Marisa [name]  Marisa [name] 03/02/2024    Comments (or additional information): Patient received semi-electric bed from [facility] today.   Summary of Stay    Date/Time Discharge Planning CSW  02/25/24 1527 Will discharge home with spouse, and children providing 24/7 care and support. Therapy recommending OP PT/OT. Wheelchair evaluation completed. Have  ordered hospital bed and 3-1 commode. DS  02/19/24 0918 Will discharge home with spouse, and children providing 24/7 care and support. Therapy recommending OP PT/OT. Demographics emailed to [employer] for [facility] evaluation on Tuesday. Will await further DME recommendations. DS  02/10/24 1205 Will discharge home with spouse, and children providing 24/7 care and support. Will await therapy follow-up recommendations. DS       Marisa [name]  Marisa [name]

## 2024-03-02 NOTE — Progress Notes (Signed)
 Pt medications we picked up from [facility] and given to pt before departure.

## 2024-03-02 NOTE — Progress Notes (Signed)
 FMLA/disability paperwork completed; documents returned to child psychotherapist for processing.

## 2024-03-02 NOTE — Progress Notes (Incomplete)
 Speech Language Pathology Discharge Summary  Patient Details  Name: Marisa [name] MRN: [id number] Date of Birth: [birth year]  Date of Discharge from SLP service:March 01, 2024   Patient has met 5 of 5 long term goals.  Patient to discharge at overall Supervision level.  Reasons goals not met: n/a   Clinical Impression/Discharge Summary:   Pt made excellent progress this stay, as evidenced by improved speech intelligibility, thought formulation, recall, problem solving, and attention. She met 5/5 LTGs this admission. She indpendently maintains 100% intelligibility at conversation level, though very slight articulatory precision deficits remain d/t slight lingual/labial weakness. She completes remaining cognitive-linguistic tasks @ supervisionA level and independently tolerates a regular diet/thin liquids w/o difficulty. Pt/family education complete and 24/7 supervision not required upon d/c. She would benefit from continued ST upon d/c to target mild remaining deficits, maximize pt communication/cognition, and subsequently facilitate improved pt independence.    Care Partner:  Caregiver Able to Provide Assistance: Yes  Type of Caregiver Assistance: Cognitive;Physical  Recommendation:  Outpatient SLP  Rationale for SLP Follow Up: Maximize functional communication;Maximize cognitive function and independence   Equipment: n/a   Reasons for discharge: Discharged from hospital   Patient/Family Agrees with Progress Made and Goals Achieved: Yes    Recardo Marisa [name] 03/02/2024, 11:39 AM

## 2024-03-03 ENCOUNTER — Other Ambulatory Visit: Payer: Self-pay

## 2024-03-03 ENCOUNTER — Encounter (HOSPITAL_BASED_OUTPATIENT_CLINIC_OR_DEPARTMENT_OTHER): Payer: Self-pay

## 2024-03-03 ENCOUNTER — Emergency Department (HOSPITAL_BASED_OUTPATIENT_CLINIC_OR_DEPARTMENT_OTHER)
Admission: EM | Admit: 2024-03-03 | Discharge: 2024-03-04 | Disposition: A | Attending: Emergency Medicine | Admitting: Emergency Medicine

## 2024-03-03 DIAGNOSIS — I10 Essential (primary) hypertension: Secondary | ICD-10-CM | POA: Insufficient documentation

## 2024-03-03 NOTE — ED Triage Notes (Signed)
 Pt presents via POV c/o hypertension at home. 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 Reports BP 200s systolic at home so wanted to be evaluated. Denies SOB. Denies chest pain. Denis additional symptoms other than HTN.

## 2024-03-03 NOTE — Plan of Care (Signed)
  Problem: RH Swallowing Goal: LTG Patient will consume least restrictive diet using compensatory strategies with assistance (SLP) Description: LTG:  Patient will consume least restrictive diet using compensatory strategies with assistance (SLP) Outcome: Completed/Met   Problem: RH Expression Communication Goal: LTG Patient will increase speech intelligibility (SLP) Description: LTG: Patient will increase speech intelligibility at word/phrase/conversation level with cues, % of the time (SLP) Outcome: Completed/Met   Problem: RH Problem Solving Goal: LTG Patient will demonstrate problem solving for (SLP) Description: LTG:  Patient will demonstrate problem solving for basic/complex daily situations with cues  (SLP) Outcome: Completed/Met   Problem: RH Memory Goal: LTG Patient will demonstrate ability for day to day (SLP) Description: LTG:   Patient will demonstrate ability for day to day recall/carryover during cognitive/linguistic activities with assist  (SLP) Outcome: Completed/Met   Problem: RH Attention Goal: LTG Patient will demonstrate this level of attention during functional activites (SLP) Description: LTG:  Patient will will demonstrate this level of attention during functional activites (SLP) Outcome: Completed/Met

## 2024-03-04 MED ORDER — AMLODIPINE BESYLATE 5 MG PO TABS: 5.0000 mg | ORAL_TABLET | Freq: Every day | ORAL | 0 refills | Status: AC | PRN

## 2024-03-04 MED ORDER — HYDROCHLOROTHIAZIDE 12.5 MG PO TABS: 6.2500 mg | ORAL_TABLET | Freq: Two times a day (BID) | ORAL | 0 refills | Status: AC

## 2024-03-04 NOTE — Discharge Instructions (Signed)
 Please double your hydrochlorothiazide .  You can either take a whole tablet in the morning or continue taking half a tablet spaced out by 8 to 10 hours.  If it has been more than an hour after taking your blood pressure medicines and your blood pressure is above 160.  Relax, drink some water, do not think about it.  Recheck it after an hour.  If still above 160 then take the amlodipine  tablet.  Do not do this more than twice in 1 day.  Keep track of your blood pressures and your blood pressure medication administration times when you follow-up with your doctor on Monday so they can make further changes or refinements.  Return to the ER if you have any new symptoms with your elevated blood pressure such as severe headache, vision changes, altered mental status, neurologic changes, chest pain, back pain, shortness of breath or any other concerns.

## 2024-03-04 NOTE — ED Provider Notes (Signed)
 " [place] [facility] Provider Note   CSN: [id number] Arrival date & time: 03/03/24  2258     Patient presents with: Hypertension   Marisa [name] is a 64 y.o. female.   Had a stroke a month ago. Left rehab yesterday. They changed her BP meds about a week prior to discharge. Her bp's were initially in the 110's, when changed they were in the 150's. Sent her home and persistently high but tonight got up to 220 systolic at home so she took her husbands amlodipine  (she had been on that previously) and it has now improved. Denies new vision changes, chest pain, back pain, sob, abdominal pain or other symptoms. Did have a 'funny feeling' in her head when her BP was high but not a headache. Improved with BP.    Hypertension       Prior to Admission medications  Medication Sig Start Date End Date Taking? Authorizing Provider  amLODipine  (NORVASC ) 5 MG tablet Take 1 tablet (5 mg total) by mouth daily as needed (if blood pressure >160 on top for more than two readings one hour apart). 03/04/24  Yes Aris Even, Selinda, MD  ACCU-CHEK GUIDE TEST test strip USE 1 STRIP TO TEST THREE TIMES A DAY TO CHECK BLOOD SUGARS. (KEEP UNUSED STRIPS IN ORIGINAL SEALED CONTAINER BETWEEN USES) 03/27/23   Jarold Medici, MD  Accu-Chek Softclix Lancets lancets USE LANCET TO TEST THREE TIMES A DAY - (DISCARD LANCET IN APPROPRIATE CONTAINER IMMEDIATELY AFTER USE. USE A NEW LANCET FOR EACH TESTING) 03/27/23   Jarold Medici, MD  acetaminophen  (TYLENOL ) 325 MG tablet Take 1-2 tablets (325-650 mg total) by mouth every 4 (four) hours as needed for mild pain (pain score 1-3). 03/01/24   Jerilynn Daphne SAILOR, NP  atorvastatin  (LIPITOR) 80 MG tablet Take 1 tablet (80 mg total) by mouth daily. 03/01/24   Jerilynn Daphne SAILOR, NP  B Complex-C (B-COMPLEX WITH VITAMIN C ) tablet Take 1 tablet by mouth daily. 03/02/24   Jerilynn Daphne SAILOR, NP  Blood Glucose Monitoring Suppl (ACCU-CHEK GUIDE ME) w/Device KIT  Use as directed to check blood sugars three times daily E11.65 02/15/22   Jarold Medici, MD  vitamin D3 (CHOLECALCIFEROL ) 25 MCG tablet Take 3 tablets (3,000 Units total) by mouth daily. 03/02/24   Jerilynn Daphne SAILOR, NP  clopidogrel  (PLAVIX ) 75 MG tablet Take 1 tablet (75 mg total) by mouth daily. 03/02/24   Jerilynn Daphne SAILOR, NP  diclofenac  Sodium (VOLTAREN ) 1 % GEL Apply 2 g topically 4 (four) times daily. 03/01/24   Jerilynn Daphne SAILOR, NP  folic acid  (FOLVITE ) 1 MG tablet TAKE ONE TABLET BY MOUTH EVERY DAY 03/27/23   Jarold Medici, MD  hydrochlorothiazide  (HYDRODIURIL ) 12.5 MG tablet Take 0.5 tablets (6.25 mg total) by mouth 2 (two) times daily. 03/04/24   Estephanie Hubbs, Selinda, MD  LINZESS  145 MCG CAPS capsule TAKE ONE CAPSULE BY MOUTH EVERY DAY BEFORE FIRST MEAL OF THE DAY Patient taking differently: Take 145 mcg by mouth as needed. 03/27/23   Jarold Medici, MD  magnesium  oxide (MAG-OX) 400 (240 Mg) MG tablet Take 0.5 tablets (200 mg total) by mouth at bedtime. 03/01/24   Jerilynn Daphne SAILOR, NP  metFORMIN  (GLUCOPHAGE ) 1000 MG tablet Take one tab po twice daily 11/26/23   Jarold Medici, MD  nebivolol  (BYSTOLIC ) 5 MG tablet Take 1 tablet (5 mg total) by mouth daily. 03/02/24   Jerilynn Daphne SAILOR, NP  potassium chloride  SA (KLOR-CON  M) 20 MEQ tablet Take 1 tablet (  20 mEq total) by mouth daily. 03/02/24   Jerilynn Daphne SAILOR, NP  senna-docusate (SENOKOT-S) 8.6-50 MG tablet Take 2 tablets by mouth at bedtime. 03/01/24   Jerilynn Daphne SAILOR, NP    Allergies: Patient has no known allergies.    Review of Systems  Updated Vital Signs BP (!) 154/81   Pulse 79   Temp 98 F (36.7 C) (Oral)   Resp 18   SpO2 100%   Physical Exam Vitals and nursing note reviewed.  Constitutional:      Appearance: She is well-developed.  HENT:     Head: Normocephalic and atraumatic.  Cardiovascular:     Rate and Rhythm: Normal rate and regular rhythm.  Pulmonary:     Effort: No respiratory distress.     Breath sounds: No stridor.   Abdominal:     General: There is no distension.  Musculoskeletal:     Cervical back: Normal range of motion.     Right lower leg: No edema.     Left lower leg: No edema.  Skin:    General: Skin is warm and dry.  Neurological:     Mental Status: She is alert. Mental status is at baseline.     (all labs ordered are listed, but only abnormal results are displayed) Labs Reviewed - No data to display  EKG: None  Radiology: No results found.   Procedures   Medications Ordered in the ED - No data to display                                  Medical Decision Making Risk Prescription drug management.   Asymptomatic hypertension. Feels well. Just worried about BP. Improved with her husbands amlodipine . On review of the notes from rehab it appears she had some leg edema from the amlodipine  and that is why they switched it. She is also only on 6.25 of hydrochlorothiazide . I advised doubling the hydrochlorothiazide  and if need she can take 5 mg of amlodipine  if she is ok with the possibility of recurrent swelling. She has follow up with her PCP in 4 days and her and family are keeping a log in order to get better control of her BP. No e/o end organ damage related to the bp at this time and thus no labs/imaging/workup is indicated.   Final diagnoses:  Hypertension, unspecified type    ED Discharge Orders          Ordered    hydrochlorothiazide  (HYDRODIURIL ) 12.5 MG tablet  2 times daily        03/04/24 0031    amLODipine  (NORVASC ) 5 MG tablet  Daily PRN        03/04/24 0031               Lazarus Sudbury, Selinda, MD 03/04/24 0535  "

## 2024-03-05 ENCOUNTER — Telehealth: Payer: Self-pay

## 2024-03-08 ENCOUNTER — Inpatient Hospital Stay: Admitting: Internal Medicine

## 2024-03-11 ENCOUNTER — Inpatient Hospital Stay: Payer: Self-pay | Admitting: Internal Medicine

## 2024-03-17 ENCOUNTER — Ambulatory Visit

## 2024-03-17 ENCOUNTER — Ambulatory Visit: Admitting: Occupational Therapy

## 2024-03-31 ENCOUNTER — Ambulatory Visit: Payer: Self-pay | Admitting: Internal Medicine

## 2024-04-05 ENCOUNTER — Encounter: Admitting: Physical Medicine and Rehabilitation

## 2024-05-24 ENCOUNTER — Inpatient Hospital Stay: Admitting: Neurology

## 2024-10-14 ENCOUNTER — Ambulatory Visit: Admitting: Neurology

## 2024-12-07 ENCOUNTER — Encounter: Payer: Self-pay | Admitting: Internal Medicine
